# Patient Record
Sex: Male | Born: 1945 | Race: White | Hispanic: No | Marital: Married | Smoking: Never smoker
Health system: Southern US, Community
[De-identification: ages and names within clinical notes are randomized; demographics above are authoritative.]

## PROBLEM LIST (undated history)

## (undated) DIAGNOSIS — E039 Hypothyroidism, unspecified: Secondary | ICD-10-CM

## (undated) DIAGNOSIS — K760 Fatty (change of) liver, not elsewhere classified: Secondary | ICD-10-CM

## (undated) DIAGNOSIS — Z9989 Dependence on other enabling machines and devices: Principal | ICD-10-CM

## (undated) DIAGNOSIS — F32A Depression, unspecified: Secondary | ICD-10-CM

## (undated) DIAGNOSIS — I209 Angina pectoris, unspecified: Secondary | ICD-10-CM

## (undated) DIAGNOSIS — I2510 Atherosclerotic heart disease of native coronary artery without angina pectoris: Secondary | ICD-10-CM

## (undated) DIAGNOSIS — M109 Gout, unspecified: Secondary | ICD-10-CM

## (undated) DIAGNOSIS — R011 Cardiac murmur, unspecified: Secondary | ICD-10-CM

## (undated) DIAGNOSIS — J189 Pneumonia, unspecified organism: Secondary | ICD-10-CM

## (undated) DIAGNOSIS — I509 Heart failure, unspecified: Secondary | ICD-10-CM

## (undated) DIAGNOSIS — F419 Anxiety disorder, unspecified: Secondary | ICD-10-CM

## (undated) DIAGNOSIS — E669 Obesity, unspecified: Secondary | ICD-10-CM

## (undated) DIAGNOSIS — K439 Ventral hernia without obstruction or gangrene: Secondary | ICD-10-CM

## (undated) DIAGNOSIS — E119 Type 2 diabetes mellitus without complications: Secondary | ICD-10-CM

## (undated) DIAGNOSIS — J342 Deviated nasal septum: Secondary | ICD-10-CM

## (undated) DIAGNOSIS — M1990 Unspecified osteoarthritis, unspecified site: Secondary | ICD-10-CM

## (undated) DIAGNOSIS — K219 Gastro-esophageal reflux disease without esophagitis: Secondary | ICD-10-CM

## (undated) DIAGNOSIS — N400 Enlarged prostate without lower urinary tract symptoms: Secondary | ICD-10-CM

## (undated) DIAGNOSIS — G4733 Obstructive sleep apnea (adult) (pediatric): Secondary | ICD-10-CM

## (undated) DIAGNOSIS — K859 Acute pancreatitis without necrosis or infection, unspecified: Secondary | ICD-10-CM

## (undated) DIAGNOSIS — I10 Essential (primary) hypertension: Secondary | ICD-10-CM

## (undated) DIAGNOSIS — I519 Heart disease, unspecified: Secondary | ICD-10-CM

## (undated) DIAGNOSIS — G629 Polyneuropathy, unspecified: Secondary | ICD-10-CM

## (undated) DIAGNOSIS — E785 Hyperlipidemia, unspecified: Secondary | ICD-10-CM

## (undated) HISTORY — DX: Type 2 diabetes mellitus without complications: E11.9

## (undated) HISTORY — DX: Dependence on other enabling machines and devices: Z99.89

## (undated) HISTORY — DX: Hypothyroidism, unspecified: E03.9

## (undated) HISTORY — PX: CARPAL TUNNEL RELEASE: SHX101

## (undated) HISTORY — DX: Essential (primary) hypertension: I10

## (undated) HISTORY — DX: Benign prostatic hyperplasia without lower urinary tract symptoms: N40.0

## (undated) HISTORY — DX: Deviated nasal septum: J34.2

## (undated) HISTORY — DX: Heart disease, unspecified: I51.9

## (undated) HISTORY — DX: Obstructive sleep apnea (adult) (pediatric): G47.33

## (undated) HISTORY — DX: Heart failure, unspecified: I50.9

## (undated) HISTORY — DX: Hyperlipidemia, unspecified: E78.5

## (undated) HISTORY — DX: Gout, unspecified: M10.9

## (undated) HISTORY — DX: Obesity, unspecified: E66.9

## (undated) HISTORY — PX: EYE SURGERY: SHX253

---
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
## 1979-01-22 HISTORY — PX: NASAL SINUS SURGERY: SHX719

## 2001-01-21 HISTORY — PX: CARPAL TUNNEL RELEASE: SHX101

## 2001-01-21 HISTORY — PX: SHOULDER SURGERY: SHX246

## 2001-05-09 ENCOUNTER — Encounter: Payer: Self-pay | Admitting: Emergency Medicine

## 2001-05-09 ENCOUNTER — Emergency Department (HOSPITAL_COMMUNITY): Admission: EM | Admit: 2001-05-09 | Discharge: 2001-05-09 | Payer: Self-pay | Admitting: Emergency Medicine

## 2002-08-22 ENCOUNTER — Encounter: Payer: Self-pay | Admitting: Emergency Medicine

## 2002-08-22 ENCOUNTER — Emergency Department (HOSPITAL_COMMUNITY): Admission: EM | Admit: 2002-08-22 | Discharge: 2002-08-22 | Payer: Self-pay | Admitting: Emergency Medicine

## 2002-11-08 ENCOUNTER — Encounter: Payer: Self-pay | Admitting: Internal Medicine

## 2002-11-08 ENCOUNTER — Encounter: Admission: RE | Admit: 2002-11-08 | Discharge: 2002-11-08 | Payer: Self-pay | Admitting: Internal Medicine

## 2002-11-22 ENCOUNTER — Ambulatory Visit (HOSPITAL_COMMUNITY): Admission: RE | Admit: 2002-11-22 | Discharge: 2002-11-22 | Payer: Self-pay | Admitting: Internal Medicine

## 2003-02-25 ENCOUNTER — Encounter: Admission: RE | Admit: 2003-02-25 | Discharge: 2003-02-25 | Payer: Self-pay | Admitting: Internal Medicine

## 2004-01-17 ENCOUNTER — Ambulatory Visit (HOSPITAL_COMMUNITY): Admission: RE | Admit: 2004-01-17 | Discharge: 2004-01-17 | Payer: Self-pay | Admitting: Gastroenterology

## 2004-12-02 ENCOUNTER — Encounter: Admission: RE | Admit: 2004-12-02 | Discharge: 2004-12-02 | Payer: Self-pay | Admitting: Internal Medicine

## 2005-01-10 ENCOUNTER — Encounter: Admission: RE | Admit: 2005-01-10 | Discharge: 2005-04-10 | Payer: Self-pay | Admitting: Neurosurgery

## 2005-01-18 ENCOUNTER — Emergency Department (HOSPITAL_COMMUNITY): Admission: EM | Admit: 2005-01-18 | Discharge: 2005-01-18 | Payer: Self-pay | Admitting: Family Medicine
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
## 2007-05-09 ENCOUNTER — Emergency Department (HOSPITAL_COMMUNITY): Admission: EM | Admit: 2007-05-09 | Discharge: 2007-05-09 | Payer: Self-pay | Admitting: Family Medicine

## 2008-02-05 ENCOUNTER — Emergency Department (HOSPITAL_COMMUNITY): Admission: EM | Admit: 2008-02-05 | Discharge: 2008-02-05 | Payer: Self-pay | Admitting: Family Medicine

## 2008-02-06 ENCOUNTER — Emergency Department (HOSPITAL_COMMUNITY): Admission: EM | Admit: 2008-02-06 | Discharge: 2008-02-06 | Payer: Self-pay | Admitting: Family Medicine

## 2008-05-17 ENCOUNTER — Emergency Department (HOSPITAL_COMMUNITY): Admission: EM | Admit: 2008-05-17 | Discharge: 2008-05-17 | Payer: Self-pay | Admitting: Family Medicine

## 2009-10-21 HISTORY — PX: BACK SURGERY: SHX140

## 2009-11-04 ENCOUNTER — Encounter: Admission: RE | Admit: 2009-11-04 | Discharge: 2009-11-04 | Payer: Self-pay | Admitting: Neurological Surgery

## 2009-11-15 ENCOUNTER — Ambulatory Visit (HOSPITAL_COMMUNITY)
Admission: RE | Admit: 2009-11-15 | Discharge: 2009-11-16 | Payer: Self-pay | Source: Home / Self Care | Admitting: Neurological Surgery

## 2010-02-12 ENCOUNTER — Encounter
Admission: RE | Admit: 2010-02-12 | Discharge: 2010-02-12 | Payer: Self-pay | Source: Home / Self Care | Attending: Neurological Surgery | Admitting: Neurological Surgery

## 2010-03-17 ENCOUNTER — Inpatient Hospital Stay (INDEPENDENT_AMBULATORY_CARE_PROVIDER_SITE_OTHER)
Admission: RE | Admit: 2010-03-17 | Discharge: 2010-03-17 | Disposition: A | Discharge status: Home / Self Care | Payer: BC Managed Care – PPO | Source: Ambulatory Visit | Attending: Family Medicine | Admitting: Family Medicine

## 2010-03-17 DIAGNOSIS — J111 Influenza due to unidentified influenza virus with other respiratory manifestations: Secondary | ICD-10-CM

## 2010-03-17 LAB — POCT I-STAT, CHEM 8
BUN: 13 mg/dL (ref 6–23)
Calcium, Ion: 1.12 mmol/L (ref 1.12–1.32)
Chloride: 103 mEq/L (ref 96–112)
Creatinine, Ser: 0.9 mg/dL (ref 0.4–1.5)
Glucose, Bld: 103 mg/dL — ABNORMAL HIGH (ref 70–99)
HCT: 46 % (ref 39.0–52.0)
Hemoglobin: 15.6 g/dL (ref 13.0–17.0)
Potassium: 3.6 mEq/L (ref 3.5–5.1)
Sodium: 138 mEq/L (ref 135–145)
TCO2: 24 mmol/L (ref 0–100)

## 2010-04-04 LAB — COMPREHENSIVE METABOLIC PANEL
ALT: 31 U/L (ref 0–53)
AST: 27 U/L (ref 0–37)
Albumin: 4.3 g/dL (ref 3.5–5.2)
Alkaline Phosphatase: 66 U/L (ref 39–117)
BUN: 14 mg/dL (ref 6–23)
CO2: 26 mEq/L (ref 19–32)
Calcium: 9.8 mg/dL (ref 8.4–10.5)
Chloride: 108 mEq/L (ref 96–112)
Creatinine, Ser: 1.05 mg/dL (ref 0.4–1.5)
GFR calc Af Amer: >60 mL/min (ref >60)
GFR calc non Af Amer: >60 mL/min (ref >60)
Glucose, Bld: 117 mg/dL — ABNORMAL HIGH (ref 70–99)
Potassium: 4 mEq/L (ref 3.5–5.1)
Sodium: 139 mEq/L (ref 135–145)
Total Bilirubin: 0.4 mg/dL (ref 0.3–1.2)
Total Protein: 6.9 g/dL (ref 6.0–8.3)

## 2010-04-04 LAB — GLUCOSE, CAPILLARY
Glucose-Capillary: 139 mg/dL — ABNORMAL HIGH (ref 70–99)
Glucose-Capillary: 143 mg/dL — ABNORMAL HIGH (ref 70–99)
Glucose-Capillary: 146 mg/dL — ABNORMAL HIGH (ref 70–99)
Glucose-Capillary: 147 mg/dL — ABNORMAL HIGH (ref 70–99)
Glucose-Capillary: 162 mg/dL — ABNORMAL HIGH (ref 70–99)
Glucose-Capillary: 223 mg/dL — ABNORMAL HIGH (ref 70–99)

## 2010-04-04 LAB — CBC
HCT: 39.8 % (ref 39.0–52.0)
Hemoglobin: 13.5 g/dL (ref 13.0–17.0)
MCH: 29.2 pg (ref 26.0–34.0)
MCHC: 33.9 g/dL (ref 30.0–36.0)
MCV: 86.1 fL (ref 78.0–100.0)
Platelets: 302 10*3/uL (ref 150–400)
RBC: 4.62 MIL/uL (ref 4.22–5.81)
RDW: 14 % (ref 11.5–15.5)
WBC: 9.5 10*3/uL (ref 4.0–10.5)

## 2010-04-04 LAB — TYPE AND SCREEN
ABO/RH(D): A POS
Antibody Screen: NEGATIVE

## 2010-04-04 LAB — SURGICAL PCR SCREEN
MRSA, PCR: NEGATIVE
Staphylococcus aureus: NEGATIVE

## 2010-04-04 LAB — DIFFERENTIAL
Basophils Absolute: 0 10*3/uL (ref 0.0–0.1)
Basophils Relative: 0 % (ref 0–1)
Eosinophils Absolute: 0.2 10*3/uL (ref 0.0–0.7)
Eosinophils Relative: 2 % (ref 0–5)
Lymphocytes Relative: 30 % (ref 12–46)
Lymphs Abs: 2.8 10*3/uL (ref 0.7–4.0)
Monocytes Absolute: 0.6 10*3/uL (ref 0.1–1.0)
Monocytes Relative: 6 % (ref 3–12)
Neutro Abs: 5.9 10*3/uL (ref 1.7–7.7)
Neutrophils Relative %: 62 % (ref 43–77)

## 2010-04-04 LAB — ABO/RH: ABO/RH(D): A POS

## 2010-04-24 ENCOUNTER — Other Ambulatory Visit: Payer: Self-pay | Admitting: Neurological Surgery

## 2010-04-24 ENCOUNTER — Ambulatory Visit
Admission: RE | Admit: 2010-04-24 | Discharge: 2010-04-24 | Disposition: A | Discharge status: Home / Self Care | Payer: BC Managed Care – PPO | Source: Ambulatory Visit | Attending: Neurological Surgery | Admitting: Neurological Surgery

## 2010-04-24 DIAGNOSIS — M542 Cervicalgia: Secondary | ICD-10-CM

## 2010-05-02 LAB — POCT URINALYSIS DIP (DEVICE)
Bilirubin Urine: NEGATIVE
Glucose, UA: NEGATIVE mg/dL
Hgb urine dipstick: NEGATIVE
Ketones, ur: NEGATIVE mg/dL
Nitrite: NEGATIVE
Protein, ur: NEGATIVE mg/dL
Specific Gravity, Urine: 1.02 (ref 1.005–1.030)
Urobilinogen, UA: 0.2 mg/dL (ref 0.0–1.0)
pH: 7.5 (ref 5.0–8.0)

## 2010-06-08 NOTE — Op Note (Signed)
Anthony [name], Anthony [name] NO.:  [id number]   MEDICAL RECORD NO.:  [id number]          PATIENT TYPE:  AMB   LOCATION:  ENDO                         FACILITY:  [facility]   PHYSICIAN:  Jordan Hawks. Elnoria Howard, MD    DATE OF BIRTH:  May 22, 1945   DATE OF PROCEDURE:  01/17/2004  DATE OF DISCHARGE:                                 OPERATIVE REPORT   PROCEDURE:  Screening colonoscopy.   ENDOSCOPIST:  Jordan Hawks. Elnoria Howard, M.D.   INDICATIONS FOR PROCEDURE:  An informed consent was obtained from the  patient, describing the risks of bleeding, infection, perforation,  medication reactions, a 10% mid-rate for a small colon cancer or polyp and  the risk of death, all of which are not exclusive of any other complications  that may occur.   PHYSICAL EXAMINATION:  HEART:  A regular rate and rhythm.  LUNGS:  Clear to auscultation bilaterally.  ABDOMEN:  Soft, nontender, nondistended.   MEDICATIONS:  Versed 7 mg IV, Demerol 70 mg IV.   DESCRIPTION OF PROCEDURE:  After adequate sedation was achieved, a rectal  examination was performed, which is negative for any palpable abnormalities.  The colonoscope was then inserted from the anus and advanced under direct  visualization to the terminal ileum without difficulty.  Photo documentation  of the terminal ileum and the cecum was obtained.  The patient was noted to  have a good prep.  Upon slow withdrawal of the colonoscope, there is no  evidence of any masses, polyps, ulcerations, inflammation, erosions, or  vascular abnormalities, or diverticula in the cecum, ascending, transverse,  descending, or sigmoid colon.  On retroflexion, the rectum revealed moderate  internal and external hemorrhoids.  The colonoscope was then straightened  and withdrawn from the patient.  The procedure was terminated.  No  complications were encountered.  The patient tolerated the procedure well.   PLAN:  A repeat colonoscopy in 10 years.       PDH/MEDQ  D:   01/17/2004  T:  01/17/2004  Job:  [id number]

## 2010-06-13 ENCOUNTER — Encounter (INDEPENDENT_AMBULATORY_CARE_PROVIDER_SITE_OTHER): Payer: BC Managed Care – PPO

## 2010-06-13 DIAGNOSIS — M79609 Pain in unspecified limb: Secondary | ICD-10-CM

## 2010-06-20 NOTE — Procedures (Unsigned)
DUPLEX DEEP VENOUS EXAM - LOWER EXTREMITY  INDICATION:  Left foot pain.  HISTORY:  Edema:  Foot trauma. Trauma/Surgery:  Dog bite to the left calf 1 week ago. Pain:  Left foot and ankle pain. PE:  No. Previous DVT:  No. Anticoagulants:  No. Other:  Redness and warmth to the left foot.  Patient presenting with fever and chills.  DUPLEX EXAM:               CFV   SFV   PopV  PTV    GSV               R  L  R  L  R  L  R   L  R  L Thrombosis    o  o     o     o      o     o Spontaneous   +  +     +     +      +     + Phasic        +  +     +     +      +     + Augmentation  +  +     +     +      +     + Compressible  +  +     +     +      +     + Competent     +  +     +     +      +     +  Legend:  + - yes  o - no  p - partial  D - decreased  IMPRESSION:  No evidence of left lower extremity deep venous thrombosis.  Results called to Dr. Rinaldo Cloud nurse.  Patient advised to return home after appointment and wait for Dr. Rinaldo Cloud office to phone call with any further instructions.   _____________________________ Fransisco Hertz, MD  EM/MEDQ  D:  06/14/2010  T:  06/14/2010  Job:  [id number]

## 2010-10-16 LAB — DIFFERENTIAL
Basophils Absolute: 0.1
Basophils Relative: 1
Eosinophils Absolute: 0.2
Eosinophils Relative: 1
Lymphocytes Relative: 23
Lymphs Abs: 2.7
Monocytes Absolute: 1
Monocytes Relative: 8
Neutro Abs: 7.8 — ABNORMAL HIGH
Neutrophils Relative %: 67

## 2010-10-16 LAB — POCT I-STAT, CHEM 8
BUN: 21
Calcium, Ion: 1.16
Chloride: 103
Creatinine, Ser: 1.6 — ABNORMAL HIGH
Glucose, Bld: 107 — ABNORMAL HIGH
HCT: 43
Hemoglobin: 14.6
Potassium: 4.1
Sodium: 138
TCO2: 27

## 2010-10-16 LAB — CBC
HCT: 40.9
Hemoglobin: 14
MCHC: 34.3
MCV: 84.9
Platelets: 375
RBC: 4.82
RDW: 13.2
WBC: 11.7 — ABNORMAL HIGH

## 2010-10-16 LAB — URIC ACID: Uric Acid, Serum: 10.2 — ABNORMAL HIGH

## 2010-11-20 ENCOUNTER — Ambulatory Visit
Admission: RE | Admit: 2010-11-20 | Discharge: 2010-11-20 | Disposition: A | Discharge status: Home / Self Care | Payer: BC Managed Care – PPO | Source: Ambulatory Visit | Attending: Neurological Surgery | Admitting: Neurological Surgery

## 2010-11-20 ENCOUNTER — Other Ambulatory Visit: Payer: Self-pay | Admitting: Neurological Surgery

## 2010-11-20 DIAGNOSIS — M5020 Other cervical disc displacement, unspecified cervical region: Secondary | ICD-10-CM

## 2010-11-20 DIAGNOSIS — M4802 Spinal stenosis, cervical region: Secondary | ICD-10-CM

## 2010-11-20 DIAGNOSIS — M542 Cervicalgia: Secondary | ICD-10-CM

## 2011-06-11 ENCOUNTER — Other Ambulatory Visit: Payer: Self-pay | Admitting: Internal Medicine

## 2011-06-11 DIAGNOSIS — R1011 Right upper quadrant pain: Secondary | ICD-10-CM

## 2011-06-12 ENCOUNTER — Other Ambulatory Visit: Payer: Self-pay | Admitting: Internal Medicine

## 2011-06-12 ENCOUNTER — Ambulatory Visit
Admission: RE | Admit: 2011-06-12 | Discharge: 2011-06-12 | Disposition: A | Discharge status: Home / Self Care | Payer: Medicare Other | Source: Ambulatory Visit | Attending: Internal Medicine | Admitting: Internal Medicine

## 2011-06-12 DIAGNOSIS — R1011 Right upper quadrant pain: Secondary | ICD-10-CM

## 2012-01-22 HISTORY — PX: CATARACT EXTRACTION: SUR2

## 2013-05-14 ENCOUNTER — Ambulatory Visit (INDEPENDENT_AMBULATORY_CARE_PROVIDER_SITE_OTHER): Payer: Medicare Other | Admitting: Neurology

## 2013-05-14 ENCOUNTER — Encounter: Payer: Self-pay | Admitting: Neurology

## 2013-05-14 ENCOUNTER — Encounter (INDEPENDENT_AMBULATORY_CARE_PROVIDER_SITE_OTHER): Payer: Self-pay

## 2013-05-14 VITALS — BP 150/90 | HR 70 | Resp 16 | Ht 70.75 in | Wt 232.0 lb

## 2013-05-14 DIAGNOSIS — Z9989 Dependence on other enabling machines and devices: Principal | ICD-10-CM

## 2013-05-14 DIAGNOSIS — G4733 Obstructive sleep apnea (adult) (pediatric): Secondary | ICD-10-CM | POA: Insufficient documentation

## 2013-05-14 DIAGNOSIS — J342 Deviated nasal septum: Secondary | ICD-10-CM

## 2013-05-14 DIAGNOSIS — E669 Obesity, unspecified: Secondary | ICD-10-CM

## 2013-05-14 DIAGNOSIS — E66811 Obesity, class 1: Secondary | ICD-10-CM | POA: Insufficient documentation

## 2013-05-14 HISTORY — DX: Obesity, class 1: E66.811

## 2013-05-14 HISTORY — DX: Obesity, unspecified: E66.9

## 2013-05-14 HISTORY — DX: Deviated nasal septum: J34.2

## 2013-05-14 NOTE — Progress Notes (Signed)
[facility]  Provider:  Larey Seat, M D  Referring Provider: Haywood Pao, MD Primary Care Physician:  Haywood Pao, MD  Chief Complaint  Patient presents with  . New Evaluation    Room 11  . Sleep consult    HPI:  Anthony [name] is a 68 y.o. male  Is seen here as a referral  from Dr. Osborne Casco for a sleep consultation,  Anthony [name] is a gentleman with health care coverage [facility], presents today for evaluation of sleep problems one is ongoing restless leg syndrome, and other a history of sleep apnea. Anthony [name] was seen in the 2009 at the [facility] based on observations of loud snoring, fragmented sleep and witnessed apneas.  He was also moderately obese at the time. On 10-21-07 the patient was diagnosed with an AHI of 16.5 and an RDI of 17.2 with mild-to-moderate apnea. He did have low oxygen saturations at the time but a very high sleep efficiency of 91.5%. He did have frequent periodic limb movements at night during REM sleep his oxygen nadir went to 79% the patient was asked to return for a CPAP titration on 11-25-on 9. His oxygen saturation throughout the night was excellent. Sleep efficiency was poor only 63.2% of the night and his sleep.  Also the patient's AHI had been 0.0 at 8 cm of water pressure, 40 minutes of sleep including 8 minutes of REM sleep, he was further titrated to 13 cm water pressure. He had 11.5 arousals spontaneously under this higher pressures as he didn't tolerate the pressure, reflected in his AHI was actually 6.58/hr. Yet this pressure was prescribed for the patient to be used at home?   His usual bedtime is 10.30 PM, he falls asleep after 30-45 minutes ( RLS)  and rises at 7.30 AM.  He wakes up frequently with 4 bathroom breaks. Neck and shoulder pain make it more difficult to find a comfortable sleep position. He cannot tolerate supine sleep, as he chokes. He l always wakes up at 5 AM sometimes able  to fall asleep again. 4.5 hours average sleep time.  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 His early rise time may be explaining the 4 AM arousal.  He uses iced tea 4-5 glasses , his only source of caffeine, No tobacco or alcohol use and no history of such.   He never felt any refreshing effects from using CPAP , and when his machine broke he felt he slept much better without it- his wife was sending him to the re evaluation because of his thunderous snoring. Yet he remained compliant with CPAP use. He has not had any supplies from his DME, which went out of business. He never has seen a medical doctor in follow up of the sleep study.    Review of Systems: Out of a complete 14 system review, the patient complains of only the following symptoms, and all other reviewed systems are negative. The patient endorses at the wrist score today at 16 points and his fatigue severity score at 48 points, the geriatric depression score at 3 points. He endorsed further loss of vision fatigue snoring wheezing and shortness of breath. Also feeling hot, having headaches, difficulty swallowing, restless legs, anxiety, some decreased energy , the feeling of not getting enough sleep, diarrhea, runny nose, hearing loss and urinary problems as well as impotence.  The patient had been exposed to agent  orange during his [employer], has diabetes, hypertension, obesity, gout and elevated cholesterol. He is also treated for GERD. SHIFTWORK history. Neck fusion, anterior access.  History   Social History  . Marital Status: Married    Spouse Name: [name]    Number of Children: 2  . Years of Education: 12   Occupational History  . [employer]    Social History Main Topics  . Smoking status: Never Smoker   . Smokeless tobacco: Never Used  . Alcohol Use: No  . Drug Use: No  . Sexual Activity: Not on file   Other Topics Concern  . Not on file   Social History Narrative    Patient is married [name]) and lives at home with his wife.   Patient has two children (twins).   Patient is retired.   Patient has a high school education.   Patient drinks 4-  8 oz of tea daily.    Family History  Problem Relation Age of Onset  . Colon cancer Father   . Colon cancer Maternal Grandfather   . Diabetes Maternal Grandmother   . Heart Problems Maternal Grandfather     Past Medical History  Diagnosis Date  . Diabetes mellitus type 2, controlled   . HTN (hypertension)   . Hyperlipidemia   . Hypothyroid   . Gout   . BPH (benign prostatic hyperplasia)   . OSA on CPAP     Past Surgical History  Procedure Laterality Date  . Carpal tunnel release Left 2003  . Shoulder surgery Right 2003  . Carpal tunnel release Bilateral   . Nasal sinus surgery  1981  . Back surgery  10/2009    Cervical, arterior  . Cataract extraction  01/2012    Current Outpatient Prescriptions  Medication Sig Dispense Refill  . allopurinol (ZYLOPRIM) 300 MG tablet 1 tablet daily.      Marland Kitchen lisinopril (PRINIVIL,ZESTRIL) 40 MG tablet 1 tablet daily.      . metFORMIN (GLUCOPHAGE) 1000 MG tablet 1 tablet 2 (two) times daily.      . pantoprazole (PROTONIX) 40 MG tablet 1 tablet daily.      . predniSONE (DELTASONE) 10 MG tablet 1 tablet daily.      Marland Kitchen rOPINIRole (REQUIP) 0.25 MG tablet 1 tablet daily.      . simvastatin (ZOCOR) 40 MG tablet 1 tablet daily.      Marland Kitchen SYNTHROID 25 MCG tablet 1 tablet daily.      . tamsulosin (FLOMAX) 0.4 MG CAPS capsule 1 capsule daily.       No current facility-administered medications for this visit.    Allergies as of 05/14/2013  . (Not on File)    Vitals: BP 150/90  Pulse 70  Resp 16  Ht 5' 10.75" (1.797 m)  Wt 232 lb (105.235 kg)  BMI 32.59 kg/m2 Last Weight:  Wt Readings from Last 1 Encounters:  05/14/13 232 lb (105.235 kg)   Last Height:   Ht Readings from Last 1 Encounters:  05/14/13 5' 10.75" (1.797 m)    Physical exam:  General: The  patient is awake, alert and appears not in acute distress. The patient is well groomed. Head: Normocephalic, atraumatic. Neck is supple. Mallampati 3, nasal congestion, septal deviation- neck circumference: 20 inches. No retrognathia.  Cardiovascular:  Regular rate and rhythm , without  murmurs or carotid bruit, and without distended neck veins. Respiratory: Lungs are clear to auscultation. Skin:  Without evidence of edema, or rash Trunk: BMI is  elevated and  patient  has normal posture.  Neurologic exam : The patient is awake and alert, oriented to place and time.  Memory subjective  described as intact.  There is a normal attention span & concentration ability. Speech is fluent without  dysarthria, dysphonia or aphasia. Mood and affect are appropriate.  Cranial nerves: patient reports reduced sense of smell.  Pupils are equal and briskly reactive to light. Funduscopic exam without evidence of pallor or edema. Extraocular movements in vertical and horizontal planes intact and without nystagmus. Visual fields by finger perimetry are intact. Hearing to finger rub intact on the right, decreased on the left .  Facial sensation intact to fine touch. Facial motor strength is symmetric and tongue and uvula move midline.  Motor exam: Normal tone  and symmetric normal strength in all extremities.  Sensory:  Fine touch, pinprick and vibration , Proprioception normal.  Coordination: Rapid alternating movements in the fingers/hands is tested and normal.Gait and station: Patient walks without assistive device .Deep tendon reflexes: in the  upper and lower extremities are symmetric and intact.   Assessment:  After physical and neurologic examination, review of laboratory studies, imaging, neurophysiology testing and pre-existing records, assessment is:   Mr. Christy Sartorius still has obstructive sleep apnea and his wife has still witnessed apnea, as well as loud snoring.  Given his upper airway and is nasal septal  deviation ,we will need a split study to re-qualify him for another machine. He does wake up in the morning with headaches- therefore the study should also measures CO2.  I would like for Mr. Reather Littler to be titrated gently, I suspect that he will need a full face mask given his history of nasal obstruction and congestion.   Plan:  Treatment plan and additional workup :

## 2013-05-14 NOTE — Patient Instructions (Signed)
Obesity Obesity is having too much body fat and a body mass index (BMI) of 30 or more. BMI is a number based on your height and weight. The number is an estimate of how much body fat you have. Obesity can happen if you eat more calories than you can burn by exercising or other activity. It can cause major health problems or emergencies.  HOME CARE  Exercise and be active as told by your doctor. Try:  Using stairs when you can.  Parking farther away from store doors.  Gardening, biking, or walking.  Eat healthy foods and drinks that are low in calories. Eat more fruits and vegetables.  Limit fast food, sweets, and snack foods that are made with ingredients that are not natural (processed food).  Eat smaller amounts of food.  Keep a journal and write down what you eat every day. Websites can help with this.  Avoid drinking alcohol. Drink more water and drinks without calories.   Take vitamins and dietary pills (supplements) only as told by your doctor.  Try going to weight-loss support groups or classes to help lessen stress. Dieticians and counselors may also help. GET HELP RIGHT AWAY IF:  You have chest pain or tightness.  You have trouble breathing or feel short of breath.  You feel weak or have loss of feeling (numbness) in your legs.  You feel confused or have trouble talking.  You have sudden changes in your vision. MAKE SURE YOU:  Understand these instructions.  Will watch your condition.  Will get help right away if you are not doing well or get worse. Document Released: 04/01/2011 Document Reviewed: 04/01/2011 [facility] Patient Information 2014 [place].

## 2013-05-24 ENCOUNTER — Ambulatory Visit (INDEPENDENT_AMBULATORY_CARE_PROVIDER_SITE_OTHER): Payer: Medicare Other

## 2013-05-24 DIAGNOSIS — Z9989 Dependence on other enabling machines and devices: Principal | ICD-10-CM

## 2013-05-24 DIAGNOSIS — G4733 Obstructive sleep apnea (adult) (pediatric): Secondary | ICD-10-CM

## 2013-05-27 ENCOUNTER — Encounter: Payer: Self-pay | Admitting: *Deleted

## 2013-05-27 ENCOUNTER — Telehealth: Payer: Self-pay | Admitting: Neurology

## 2013-05-27 DIAGNOSIS — G4733 Obstructive sleep apnea (adult) (pediatric): Secondary | ICD-10-CM

## 2013-05-27 NOTE — Telephone Encounter (Signed)
I called and spoke with the patient about his recent sleep study results. I informed the patient that the study revealed mild obstructive sleep apnea. Dr. Brett Fairy recommend auto CPAP for 14 days, so I will send the order to [facility] who contact the patient. I will fax a copy of the report to Dr. Loren Racer office and mail a copy of the report to the patient.

## 2013-06-01 ENCOUNTER — Encounter: Payer: Self-pay | Admitting: Neurology

## 2013-06-21 HISTORY — PX: OTHER SURGICAL HISTORY: SHX169

## 2013-06-29 ENCOUNTER — Encounter: Payer: Self-pay | Admitting: Neurology

## 2013-06-29 ENCOUNTER — Encounter (INDEPENDENT_AMBULATORY_CARE_PROVIDER_SITE_OTHER): Payer: Self-pay

## 2013-06-29 ENCOUNTER — Ambulatory Visit (INDEPENDENT_AMBULATORY_CARE_PROVIDER_SITE_OTHER): Payer: Medicare Other | Admitting: Neurology

## 2013-06-29 VITALS — BP 123/79 | HR 74 | Resp 16 | Ht 70.75 in | Wt 239.0 lb

## 2013-06-29 DIAGNOSIS — Z9989 Dependence on other enabling machines and devices: Principal | ICD-10-CM

## 2013-06-29 DIAGNOSIS — G4733 Obstructive sleep apnea (adult) (pediatric): Secondary | ICD-10-CM

## 2013-06-29 MED ORDER — MOMETASONE FUROATE 50 MCG/ACT NA SUSP: 2.0000 | Freq: Every day | NASAL | Status: DC

## 2013-06-29 MED ORDER — CARBIDOPA-LEVODOPA 25-100 MG PO TABS: 1.0000 | ORAL_TABLET | ORAL | Status: DC
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
## 2013-06-29 NOTE — Patient Instructions (Signed)
Restless Legs Syndrome Restless legs syndrome is a movement disorder. It may also be called a sensori-motor disorder.  CAUSES  No one knows what specifically causes restless legs syndrome, but it tends to run in families. It is also more common in people with low iron, in pregnancy, in people who need dialysis, and those with nerve damage (neuropathy).Some medications may make restless legs syndrome worse.Those medications include drugs to treat high blood pressure, some heart conditions, nausea, colds, allergies, and depression. SYMPTOMS Symptoms include uncomfortable sensations in the legs. These leg sensations are worse during periods of inactivity or rest. They are also worse while sitting or lying down. Individuals that have the disorder describe sensations in the legs that feel like:  Pulling.  Drawing.  Crawling.  Worming.  Boring.  Tingling.  Pins and needles.  Prickling.  Pain. The sensations are usually accompanied by an overwhelming urge to move the legs. Sudden muscle jerks may also occur. Movement provides temporary relief from the discomfort. In rare cases, the arms may also be affected. Symptoms may interfere with going to sleep (sleep onset insomnia). Restless legs syndrome may also be related to periodic limb movement disorder (PLMD). PLMD is another more common motor disorder. It also causes interrupted sleep. The symptoms from PLMD usually occur most often when you are awake. TREATMENT  Treatment for restless legs syndrome is symptomatic. This means that the symptoms are treated.   Massage and cold compresses may provide temporary relief.  Walk, stretch, or take a cold or hot bath.  Get regular exercise and a good night's sleep.  Avoid caffeine, alcohol, nicotine, and medications that can make it worse.  Do activities that provide mental stimulation like discussions, needlework, and video games. These may be helpful if you are not able to walk or  stretch. Some medications are effective in relieving the symptoms. However, many of these medications have side effects. Ask your caregiver about medications that may help your symptoms. Correcting iron deficiency may improve symptoms for some patients. Document Released: 12/28/2001 Document Revised: 04/01/2011 Document Reviewed: 04/05/2010 ExitCare Patient Information 2014 [employer].  

## 2013-08-10 ENCOUNTER — Encounter: Payer: Self-pay | Admitting: Adult Health

## 2013-08-10 ENCOUNTER — Ambulatory Visit (INDEPENDENT_AMBULATORY_CARE_PROVIDER_SITE_OTHER): Payer: Medicare Other | Admitting: Adult Health

## 2013-08-10 VITALS — BP 161/87 | HR 73 | Ht 69.75 in | Wt 243.0 lb

## 2013-08-10 DIAGNOSIS — E669 Obesity, unspecified: Secondary | ICD-10-CM

## 2013-08-10 DIAGNOSIS — Z9989 Dependence on other enabling machines and devices: Principal | ICD-10-CM

## 2013-08-10 DIAGNOSIS — G4733 Obstructive sleep apnea (adult) (pediatric): Secondary | ICD-10-CM

## 2013-08-10 DIAGNOSIS — R202 Paresthesia of skin: Secondary | ICD-10-CM

## 2013-08-10 DIAGNOSIS — R209 Unspecified disturbances of skin sensation: Secondary | ICD-10-CM

## 2013-08-10 DIAGNOSIS — R200 Anesthesia of skin: Secondary | ICD-10-CM

## 2013-08-10 MED ORDER — GABAPENTIN 100 MG PO CAPS: 100.0000 mg | ORAL_CAPSULE | Freq: Three times a day (TID) | ORAL | Status: DC

## 2013-08-10 NOTE — Patient Instructions (Signed)
Gabapentin capsules or tablets What is this medicine? GABAPENTIN (GA ba pen tin) is used to control partial seizures in adults with epilepsy. It is also used to treat certain types of nerve pain. This medicine may be used for other purposes; ask your health care provider or pharmacist if you have questions. COMMON BRAND NAME(S): Gabarone, Neurontin What should I tell my health care provider before I take this medicine? They need to know if you have any of these conditions: -kidney disease -suicidal thoughts, plans, or attempt; a previous suicide attempt by you or a family member -an unusual or allergic reaction to gabapentin, other medicines, foods, dyes, or preservatives -pregnant or trying to get pregnant -breast-feeding How should I use this medicine? Take this medicine by mouth with a glass of water. Follow the directions on the prescription label. You can take it with or without food. If it upsets your stomach, take it with food.Take your medicine at regular intervals. Do not take it more often than directed. Do not stop taking except on your doctor's advice. If you are directed to break the 600 or 800 mg tablets in half as part of your dose, the extra half tablet should be used for the next dose. If you have not used the extra half tablet within 28 days, it should be thrown away. A special MedGuide will be given to you by the pharmacist with each prescription and refill. Be sure to read this information carefully each time. Talk to your pediatrician regarding the use of this medicine in children. Special care may be needed. Overdosage: If you think you have taken too much of this medicine contact a poison control center or emergency room at once. NOTE: This medicine is only for you. Do not share this medicine with others. What if I miss a dose? If you miss a dose, take it as soon as you can. If it is almost time for your next dose, take only that dose. Do not take double or extra  doses. What may interact with this medicine? Do not take this medicine with any of the following medications: -other gabapentin products This medicine may also interact with the following medications: -alcohol -antacids -antihistamines for allergy, cough and cold -certain medicines for anxiety or sleep -certain medicines for depression or psychotic disturbances -homatropine; hydrocodone -naproxen -narcotic medicines (opiates) for pain -phenothiazines like chlorpromazine, mesoridazine, prochlorperazine, thioridazine This list may not describe all possible interactions. Give your health care provider a list of all the medicines, herbs, non-prescription drugs, or dietary supplements you use. Also tell them if you smoke, drink alcohol, or use illegal drugs. Some items may interact with your medicine. What should I watch for while using this medicine? Visit your doctor or health care professional for regular checks on your progress. You may want to keep a record at home of how you feel your condition is responding to treatment. You may want to share this information with your doctor or health care professional at each visit. You should contact your doctor or health care professional if your seizures get worse or if you have any new types of seizures. Do not stop taking this medicine or any of your seizure medicines unless instructed by your doctor or health care professional. Stopping your medicine suddenly can increase your seizures or their severity. Wear a medical identification bracelet or chain if you are taking this medicine for seizures, and carry a card that lists all your medications. You may get drowsy, dizzy, or have blurred   vision. Do not drive, use machinery, or do anything that needs mental alertness until you know how this medicine affects you. To reduce dizzy or fainting spells, do not sit or stand up quickly, especially if you are an older patient. Alcohol can increase drowsiness and  dizziness. Avoid alcoholic drinks. Your mouth may get dry. Chewing sugarless gum or sucking hard candy, and drinking plenty of water will help. The use of this medicine may increase the chance of suicidal thoughts or actions. Pay special attention to how you are responding while on this medicine. Any worsening of mood, or thoughts of suicide or dying should be reported to your health care professional right away. Women who become pregnant while using this medicine may enroll in the [place] American Antiepileptic Drug Pregnancy Registry by calling 1-888-233-2334. This registry collects information about the safety of antiepileptic drug use during pregnancy. What side effects may I notice from receiving this medicine? Side effects that you should report to your doctor or health care professional as soon as possible: -allergic reactions like skin rash, itching or hives, swelling of the face, lips, or tongue -worsening of mood, thoughts or actions of suicide or dying Side effects that usually do not require medical attention (report to your doctor or health care professional if they continue or are bothersome): -constipation -difficulty walking or controlling muscle movements -dizziness -nausea -slurred speech -tiredness -tremors -weight gain This list may not describe all possible side effects. Call your doctor for medical advice about side effects. You may report side effects to FDA at 1-800-FDA-1088. Where should I keep my medicine? Keep out of reach of children. Store at room temperature between 15 and 30 degrees C (59 and 86 degrees F). Throw away any unused medicine after the expiration date. NOTE: This sheet is a summary. It may not cover all possible information. If you have questions about this medicine, talk to your doctor, pharmacist, or health care provider.  2015, Elsevier/Gold Standard. (2012-09-10 09:12:48)  

## 2013-08-10 NOTE — Progress Notes (Signed)
PATIENT: Anthony [name] DOB: [birth year]  REASON FOR VISIT: follow up HISTORY FROM: patient  HISTORY OF PRESENT ILLNESS: Anthony [name] is a 68 year old male with history of obstructive sleep Apnea. He returns today for a 30 day compliance download. He brought his machine with him today and his reports shows an AHI of 4.2 at 8-13cm of water, uses his machine for 8 hours and 38 minutes a night, with 100% compliance. His Epworth score is 13 points was previously 16 points. His fatigue severity score is 40 was previously 48.  Patient reports that he gets about 7 hours of sleep a night. He goes to bed around 10-11pm and arises at 7:30am . He states that it usually takes between 30 minutes to 1 hour for him to fall asleep. States that the gets up about 2 times a night to urinate. Overall patient feels that CPAP has improved his sleepiness and fatigue. Since the last visit the patient has had no new medical issues. Patient was taking Requip due to RLS but that was discontinued last visit and he was started on sinemet. He reports that the sinemet does not help at all. He states that during the day he has burning and tingling in the bottom of the feet and notices this the most when he is sitting and resting. Patient states that when he wears his tight socks the discomfort is better. Patient is concerned because he is gaining weight but he is exercising daily. He states this started with Requip but did not improve with sinemet.   REVIEW OF SYSTEMS: Full 14 system review of systems performed and notable only for:  Constitutional: unexpected weight change, excessive sweating Eyes: N/A Ear/Nose/Throat: runny nose Skin: N/A  Cardiovascular: leg swelling Respiratory: N/A  Gastrointestinal: N/A  Genitourinary: N/A Hematology/Lymphatic: N/A  Endocrine: N/A Musculoskeletal: neck pain and neck stiffness Allergy/Immunology: N/A  Neurological: N/A Psychiatric: N/A Sleep: restless leg, daytime  sleepiness   ALLERGIES: No Known Allergies  HOME MEDICATIONS: Outpatient Prescriptions Prior to Visit  Medication Sig Dispense Refill  . allopurinol (ZYLOPRIM) 300 MG tablet 1 tablet daily.      . carbidopa-levodopa (SINEMET) 25-100 MG per tablet Take 1 tablet by mouth as directed. Take one 2 hours prior to bed time.  30 tablet  1  . furosemide (LASIX) 40 MG tablet Take 40 mg by mouth daily as needed.       Marland Kitchen lisinopril (PRINIVIL,ZESTRIL) 40 MG tablet 1 tablet daily.      . metFORMIN (GLUCOPHAGE) 1000 MG tablet 1 tablet 2 (two) times daily.      . mometasone (NASONEX) 50 MCG/ACT nasal spray Place 2 sprays into the nose daily.  17 g  12  . pantoprazole (PROTONIX) 40 MG tablet 1 tablet daily.      . predniSONE (DELTASONE) 10 MG tablet 1 tablet daily.      . simvastatin (ZOCOR) 40 MG tablet 1 tablet daily.      Marland Kitchen SYNTHROID 25 MCG tablet 1 tablet daily.      . tamsulosin (FLOMAX) 0.4 MG CAPS capsule 1 capsule daily.       No facility-administered medications prior to visit.    PAST MEDICAL HISTORY: Past Medical History  Diagnosis Date  . Diabetes mellitus type 2, controlled   . HTN (hypertension)   . Hyperlipidemia   . Hypothyroid   . Gout   . BPH (benign prostatic hyperplasia)   . OSA on CPAP   . 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  Obesity (BMI 30.0-34.9) 05/14/2013    PAST SURGICAL HISTORY: Past Surgical History  Procedure Laterality Date  . Carpal tunnel release Left 2003  . Shoulder surgery Right 2003  . Carpal tunnel release Bilateral   . Nasal sinus surgery  1981  . Back surgery  10/2009    Cervical, arterior  . Cataract extraction  01/2012  . Retinal  06/2013    Retinal peel    FAMILY HISTORY: Family History  Problem Relation Age of Onset  . Colon cancer Father   . Colon cancer Maternal Grandfather   . Diabetes Maternal Grandmother   . Heart Problems Maternal Grandfather     SOCIAL HISTORY: History   Social History  . Marital Status: Married     Spouse Name: Anthony [name]    Number of Children: 2  . Years of Education: 12   Occupational History  . [employer]    Social History Main Topics  . Smoking status: Never Smoker   . Smokeless tobacco: Never Used  . Alcohol Use: No  . Drug Use: No  . Sexual Activity: Not on file   Other Topics Concern  . Not on file   Social History Narrative   Patient is married Anthony [name]) and lives at home with his wife.   Patient has two children (twins).   Patient is retired.   Patient has a high school education.   Patient does not drink any caffeine.   Patient is left-handed.      PHYSICAL EXAM  Filed Vitals:   08/10/13 1403  BP: 161/87  Pulse: 73  Height: 5' 9.75" (1.772 m)  Weight: 243 lb (110.224 kg)   Body mass index is 35.1 kg/(m^2).  Generalized: Well developed, in no acute distress   Neurological examination  Mentation: Alert oriented to time, place, history taking. Follows all commands speech and language fluent Cranial nerve II-XII: .Pupils were equal round reactive to light extraocular movements were full, visual field were full on confrontational test.  Motor: The motor testing reveals 5 over 5 strength of all 4 extremities. Good symmetric motor tone is noted throughout.  Sensory: Sensory testing is intact to  soft touch and position sense on all 4 extremities. Decreased pinprick in a stocking pattern in bilateral feet. Vibration sensation is decreased in lower extremities. No evidence of extinction is noted.  Coordination: Cerebellar testing reveals good finger-nose-finger and heel-to-shin bilaterally.  Gait and station: Gait is normal.  Reflexes: Deep tendon reflexes are symmetric but depressed  DIAGNOSTIC DATA (LABS, IMAGING, TESTING) - I reviewed patient records, labs, notes, testing and imaging myself where available.  Lab Results  Component Value Date   WBC 9.5 11/10/2009   HGB 15.6 03/17/2010   HCT 46.0 03/17/2010   MCV 86.1 11/10/2009   PLT 302 11/10/2009       Component Value Date/Time   NA 138 03/17/2010 1145   K 3.6 03/17/2010 1145   CL 103 03/17/2010 1145   CO2 26 11/10/2009 1554   GLUCOSE 103* 03/17/2010 1145   BUN 13 03/17/2010 1145   CREATININE 0.9 03/17/2010 1145   CALCIUM 9.8 11/10/2009 1554   PROT 6.9 11/10/2009 1554   ALBUMIN 4.3 11/10/2009 1554   AST 27 11/10/2009 1554   ALT 31 11/10/2009 1554   ALKPHOS 66 11/10/2009 1554   BILITOT 0.4 11/10/2009 1554   GFRNONAA >60 11/10/2009 1554   GFRAA  Value: >60        The eGFR has been calculated using the MDRD equation. This calculation has  not been validated in all clinical situations. eGFR's persistently <60 mL/min signify possible Chronic Kidney Disease. 11/10/2009 1554    ASSESSMENT AND PLAN 68 y.o. year old male  has a past medical history of Diabetes mellitus type 2, controlled; HTN (hypertension); Hyperlipidemia; Hypothyroid; Gout; BPH (benign prostatic hyperplasia); OSA on CPAP; Nasal septal deformity (05/14/2013); and Obesity (BMI 30.0-34.9) (05/14/2013). here with:  1. OSA CPAP 2. Obesity 3. Numbness and tingling of both legs below the knees.  Patient has excellent compliance on CPAP and AHI has reduced to 4.2 from 8.0 at the last visit. Overall he does feel better with the CPAP. Patent still having issues with restless legs. He states that it takes him about an hour to get comfortable before falling asleep. He was on Requip but it discontinue due to compulsiveness. He tried Sinemet but states that offered no benefit. Patient is also complaining of numbness and tingling in both feet during the day mostly at rest. 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 I will order nerve conduction studies with EMG on both legs. Sinemet will be discontinued. I will start the patient on gabapentin 100 mg TID. This is a low dose and may need to be tapered up to provide relief for his discomfort. Patient also concerned about weight gain however he is on  prednisone which can cause this. I explained this to the patient and he verbalized understanding. Patient should follow-up in 3 months or sooner if needed.   Ward Givens, MSN, NP-C 08/10/2013, 2:26 PM [facility] [address], [place], [place] [address] [phone]  Note: This document was prepared with digital dictation and possible smart phrase technology. Any transcriptional errors that result from this process are unintentional.

## 2013-08-23 ENCOUNTER — Telehealth: Payer: Self-pay | Admitting: Adult Health

## 2013-08-23 MED ORDER — GABAPENTIN 100 MG PO CAPS: ORAL_CAPSULE | ORAL | Status: DC

## 2013-08-23 NOTE — Telephone Encounter (Signed)
Anthony [name] DOB 05/29/2045 is calling--patient has been taking Gabapentin 100mg  and medication is not helping--patient still has nerve pain in his legs--can dosage be increased--please call and advise--you can leave a message--[facility]

## 2013-08-23 NOTE — Telephone Encounter (Signed)
Patient called stating that he is still having some discomfort in his feet at night. He is currently take gabapentin 100 mg TID. I will increase his dose to 1 capsule (100 mg)  in the morning and at noon and 3 capsules at bedtime. Patient verbalized understanding.

## 2013-08-23 NOTE — Telephone Encounter (Signed)
I called the patient and left a message for him to return my call.

## 2013-08-23 NOTE — Telephone Encounter (Signed)
Patient does not feel the current dose of Gabapentin is helpful and would like to request a dose increase.  Please advise.  Thank you.

## 2013-08-30 ENCOUNTER — Encounter (INDEPENDENT_AMBULATORY_CARE_PROVIDER_SITE_OTHER): Payer: Self-pay

## 2013-08-30 ENCOUNTER — Telehealth: Payer: Self-pay | Admitting: Adult Health

## 2013-08-30 ENCOUNTER — Ambulatory Visit (INDEPENDENT_AMBULATORY_CARE_PROVIDER_SITE_OTHER): Payer: Medicare Other | Admitting: Neurology

## 2013-08-30 ENCOUNTER — Encounter: Payer: Self-pay | Admitting: Neurology

## 2013-08-30 DIAGNOSIS — R200 Anesthesia of skin: Secondary | ICD-10-CM

## 2013-08-30 DIAGNOSIS — R202 Paresthesia of skin: Secondary | ICD-10-CM

## 2013-08-30 DIAGNOSIS — Z0289 Encounter for other administrative examinations: Secondary | ICD-10-CM

## 2013-08-30 DIAGNOSIS — R209 Unspecified disturbances of skin sensation: Secondary | ICD-10-CM

## 2013-08-30 NOTE — Procedures (Signed)
   NCS (NERVE CONDUCTION STUDY) WITH EMG (ELECTROMYOGRAPHY) REPORT   STUDY DATE: August 10th 2015  PATIENT NAME: Anthony [name] DOB: 08-12-1945 MRN: [id number]    TECHNOLOGIST: Laretta Alstrom ELECTROMYOGRAPHER: Marcial Pacas M.D.  CLINICAL INFORMATION:  68 years old male, with past medical history of type 2 diabetes, presenting with couple years history of bilateral lower extremity discomfort or to move  On examination: Bilateral lower extremity motor strength was normal, no significant sensory loss.  FINDINGS: NERVE CONDUCTION STUDY: Bilateral peroneal sensory responses were normal. Bilateral peroneal EDB, and tibial motor responses were normal. bilateral tibial H. reflexes were normal and symmetric.  NEEDLE ELECTROMYOGRAPHY: Selected needle examination was performed at right lower extremity muscles, and right lumbosacral paraspinal muscles.  Needle examination of right tibialis anterior, tibialis posterior, medial gastrocnemius, peroneal longus, vastus lateralis, biceps femoris short head was normal  Needle examination of right abductor hallucis longus was normal, there was no spontaneous activity  There was no spontaneous activity at right lumbosacral paraspinal muscles, right L4-5 S1  IMPRESSION:   This is a normal study. There is no electrodiagnostic evidence of a large fiber peripheral neuropathy, or right lumbosacral radiculopathy.   INTERPRETING PHYSICIAN:   Marcial Pacas M.D. Ph.D. [facility] [address] [place], [place] [address] [phone]

## 2013-08-30 NOTE — Telephone Encounter (Signed)
I called the patient and reviewed the nerve conduction with EMG results with him. I explained that his test was normal however he can still have a small fiber neuropathy that would not show up on anerve conduction study/ EMG. He states that since we increase the gabapentin at night hsi discomfort has been controlled. I advised the patient to let us know if his discomfort level increases as the gabapentin can be increased further.

## 2013-10-06 ENCOUNTER — Encounter: Payer: Self-pay | Admitting: Neurology

## 2013-11-10 ENCOUNTER — Encounter (INDEPENDENT_AMBULATORY_CARE_PROVIDER_SITE_OTHER): Payer: Self-pay

## 2013-11-10 ENCOUNTER — Ambulatory Visit (INDEPENDENT_AMBULATORY_CARE_PROVIDER_SITE_OTHER): Payer: Medicare Other | Admitting: Adult Health

## 2013-11-10 ENCOUNTER — Encounter: Payer: Self-pay | Admitting: Adult Health

## 2013-11-10 VITALS — BP 143/77 | HR 59 | Wt 240.0 lb

## 2013-11-10 DIAGNOSIS — G4733 Obstructive sleep apnea (adult) (pediatric): Secondary | ICD-10-CM

## 2013-11-10 DIAGNOSIS — R202 Paresthesia of skin: Secondary | ICD-10-CM | POA: Insufficient documentation

## 2013-11-10 DIAGNOSIS — G609 Hereditary and idiopathic neuropathy, unspecified: Secondary | ICD-10-CM

## 2013-11-10 DIAGNOSIS — R200 Anesthesia of skin: Secondary | ICD-10-CM | POA: Insufficient documentation

## 2013-11-10 DIAGNOSIS — Z9989 Dependence on other enabling machines and devices: Principal | ICD-10-CM

## 2013-11-10 MED ORDER — GABAPENTIN 100 MG PO CAPS: ORAL_CAPSULE | ORAL | Status: DC

## 2013-11-10 NOTE — Patient Instructions (Signed)

## 2013-11-10 NOTE — Progress Notes (Signed)
PATIENT: Anthony [name] DOB: Jul 15, 1945  REASON FOR VISIT: follow up HISTORY FROM: patient  HISTORY OF PRESENT ILLNESS: Anthony [name] is a 68 year old male with a history of obstructive sleep Apnea and numbness and tingling in the lower extremities. He returns today for follow-up. He was started on gabapentin and reports that his discomfort has resolved. He would like to take 400 mg at night instead of 300 mg.  Patient states that he is sleeping well. There are some nights that he is restless. Continues to use the CPAP nightly. His prior download showed excellent compliance. He states that for over 4 years he has had numbness in the left hand in the last two fingers. He states that those fingers stay numb and sometimes during the night he will have some discomfort that will wake him up. He had neck surgery for fusion 5 years ago by Dr. Ronnald Ramp. He states that the numbness and discomfort is tolerable.   HISTORY: 68 year old male with history of obstructive sleep Apnea. He returns today for a 30 day compliance download. He brought his machine with him today and his reports shows an AHI of 4.2 at 8-13 cm of water, uses his machine for 8 hours and 38 minutes a night, with 100% compliance. His Epworth score is 13 points was previously 16 points. His fatigue severity score is 40 was previously 48. Patient reports that he gets about 7 hours of sleep a night. He goes to bed around 10-11pm and arises at 7:30am . He states that it usually takes between 30 minutes to 1 hour for him to fall asleep. States that the gets up about 2 times a night to urinate. Overall patient feels that CPAP has improved his sleepiness and fatigue. Since the last visit the patient has had no new medical issues.  Patient was taking Requip due to RLS but that was discontinued last visit and he was started on sinemet. He reports that the sinemet does not help at all. He states that during the day he has burning and tingling in the bottom of  the feet and notices this the most when he is sitting and resting. Patient states that when he wears his tight socks the discomfort is better. Patient is concerned because he is gaining weight but he is exercising daily. He states this started with Requip but did not improve with sinemet.    REVIEW OF SYSTEMS: Full 14 system review of systems performed and notable only for:  Constitutional: N/A  Eyes: eye discharge Ear/Nose/Throat: trouble swallowing Skin: N/A  Cardiovascular: leg swelling Respiratory: N/A  Gastrointestinal: N/A  Genitourinary: N/A Hematology/Lymphatic: N/A  Endocrine: excessive eating Musculoskeletal:back pain, muscle cramps, neck pain Allergy/Immunology: N/A  Neurological: N/A Psychiatric: N/A Sleep: restless leg, snoring   ALLERGIES: No Known Allergies  HOME MEDICATIONS: Outpatient Prescriptions Prior to Visit  Medication Sig Dispense Refill  . allopurinol (ZYLOPRIM) 300 MG tablet 1 tablet daily.      . carbidopa-levodopa (SINEMET IR) 25-100 MG per tablet Take 1 tablet by mouth daily.      . furosemide (LASIX) 40 MG tablet Take 40 mg by mouth daily as needed.       . gabapentin (NEURONTIN) 100 MG capsule Take 1 capsule in the morning and at noon. Take 3 capsules at bedtime.  150 capsule  3  . lisinopril (PRINIVIL,ZESTRIL) 40 MG tablet 1 tablet daily.      . metFORMIN (GLUCOPHAGE) 1000 MG tablet 1 tablet 2 (two)  times daily.      . mometasone (NASONEX) 50 MCG/ACT nasal spray Place 2 sprays into the nose daily.  17 g  12  . pantoprazole (PROTONIX) 40 MG tablet 1 tablet daily.      . predniSONE (DELTASONE) 10 MG tablet 1 tablet daily.      . simvastatin (ZOCOR) 40 MG tablet 1 tablet daily.      Marland Kitchen SYNTHROID 25 MCG tablet 1 tablet daily.      . tamsulosin (FLOMAX) 0.4 MG CAPS capsule 1 capsule daily.       No facility-administered medications prior to visit.    PAST MEDICAL HISTORY: Past Medical History  Diagnosis Date  . Diabetes mellitus type 2,  controlled   . HTN (hypertension)   . Hyperlipidemia   . Hypothyroid   . Gout   . BPH (benign prostatic hyperplasia)   . OSA on CPAP   . 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 Obesity (BMI 30.0-34.9) 05/14/2013    PAST SURGICAL HISTORY: Past Surgical History  Procedure Laterality Date  . Carpal tunnel release Left 2003  . Shoulder surgery Right 2003  . Carpal tunnel release Bilateral   . Nasal sinus surgery  1981  . Back surgery  10/2009    Cervical, arterior  . Cataract extraction  01/2012  . Retinal  06/2013    Retinal peel    FAMILY HISTORY: Family History  Problem Relation Age of Onset  . Colon cancer Father   . Colon cancer Maternal Grandfather   . Heart Problems Maternal Grandfather   . Diabetes Maternal Grandmother   . Lung cancer Brother     SOCIAL HISTORY: History   Social History  . Marital Status: Married    Spouse Name: [name]    Number of Children: 2  . Years of Education: 12   Occupational History  . [name]    Social History Main Topics  . Smoking status: Never Smoker   . Smokeless tobacco: Never Used  . Alcohol Use: No  . Drug Use: No  . Sexual Activity: Not on file   Other Topics Concern  . Not on file   Social History Narrative   Patient is married [name]) and lives at home with his wife.   Patient has two children (twins).   Patient is retired.   Patient has a high school education.   Patient does not drink any caffeine.   Patient is left-handed.      PHYSICAL EXAM  Filed Vitals:   11/10/13 1324  BP: 143/77  Pulse: 59  Weight: 240 lb (108.863 kg)   Body mass index is 34.67 kg/(m^2).  Generalized: Well developed, in no acute distress   Neurological examination  Mentation: Alert oriented to time, place, history taking. Follows all commands speech and language fluent Cranial nerve II-XII: Pupils were equal round reactive to light. Extraocular movements were full, visual field were full on confrontational test. Facial  sensation and strength were normal.  Uvula tongue midline. Head turning and shoulder shrug  were normal and symmetric. Motor: The motor testing reveals 5 over 5 strength of all 4 extremities. Good symmetric motor tone is noted throughout.  Sensory: Sensory testing is intact to soft touch on all 4 extremities. No evidence of extinction is noted.  Coordination: Cerebellar testing reveals good finger-nose-finger and heel-to-shin bilaterally.  Gait and station: Gait is normal. Tandem gait is normal. Romberg is negative. No drift is seen.  Reflexes: Deep tendon reflexes are symmetric and normal bilaterally.  DIAGNOSTIC DATA (LABS, IMAGING, TESTING) - I reviewed patient records, labs, notes, testing and imaging myself where available.  Lab Results  Component Value Date   WBC 9.5 11/10/2009   HGB 15.6 03/17/2010   HCT 46.0 03/17/2010   MCV 86.1 11/10/2009   PLT 302 11/10/2009      Component Value Date/Time   NA 138 03/17/2010 1145   K 3.6 03/17/2010 1145   CL 103 03/17/2010 1145   CO2 26 11/10/2009 1554   GLUCOSE 103* 03/17/2010 1145   BUN 13 03/17/2010 1145   CREATININE 0.9 03/17/2010 1145   CALCIUM 9.8 11/10/2009 1554   PROT 6.9 11/10/2009 1554   ALBUMIN 4.3 11/10/2009 1554   AST 27 11/10/2009 1554   ALT 31 11/10/2009 1554   ALKPHOS 66 11/10/2009 1554   BILITOT 0.4 11/10/2009 1554   GFRNONAA >60 11/10/2009 1554   GFRAA  Value: >60        The eGFR has been calculated using the MDRD equation. This calculation has not been validated in all clinical situations. eGFR's persistently <60 mL/min signify possible Chronic Kidney Disease. 11/10/2009 1554     ASSESSMENT AND PLAN 68 y.o. year old male  has a past medical history of Diabetes mellitus type 2, controlled; HTN (hypertension); Hyperlipidemia; Hypothyroid; Gout; BPH (benign prostatic hyperplasia); OSA on CPAP; Nasal septal deformity (05/14/2013); and Obesity (BMI 30.0-34.9) (05/14/2013). here with:  1. Peripheral neuropathy 2. OSA on  CPAP  Patient peripheral neuropathy as been controlled with gabapentin. It is thought that the neuropathy must be a small fiber neuropathy because previous NCS with EMG was normal. However is exam & history is consistent with a peripheral neuropathy. It sometimes has restless nights and has inquired if his nighttime dose can be increased. I will increase his nighttime dose to 400 mg. His prior CPAP download showed good compliance.   Ward Givens, MSN, NP-C 11/10/2013, 1:38 PM [facility] [address] [place], [place] [address] [phone]  Note: This document was prepared with digital dictation and possible smart phrase technology. Any transcriptional errors that result from this process are unintentional.

## 2013-11-11 NOTE — Progress Notes (Signed)
I agree with the assessment and plan as directed by NP .The patient is known to me .   Orchid Glassberg, MD  

## 2013-12-08 ENCOUNTER — Encounter: Payer: Self-pay | Admitting: Neurology

## 2013-12-14 ENCOUNTER — Encounter: Payer: Self-pay | Admitting: Neurology

## 2013-12-25 ENCOUNTER — Other Ambulatory Visit: Payer: Self-pay | Admitting: Adult Health

## 2014-04-29 ENCOUNTER — Other Ambulatory Visit: Payer: Self-pay | Admitting: Adult Health

## 2014-05-04 ENCOUNTER — Other Ambulatory Visit: Payer: Self-pay | Admitting: Adult Health

## 2014-05-12 ENCOUNTER — Ambulatory Visit: Payer: Medicare Other | Admitting: Adult Health

## 2014-05-23 ENCOUNTER — Other Ambulatory Visit: Payer: Self-pay | Admitting: Gastroenterology

## 2014-06-02 ENCOUNTER — Other Ambulatory Visit: Payer: Self-pay | Admitting: Adult Health

## 2014-06-29 ENCOUNTER — Encounter (HOSPITAL_COMMUNITY): Payer: Self-pay | Admitting: *Deleted

## 2014-06-29 ENCOUNTER — Other Ambulatory Visit: Payer: Self-pay | Admitting: Adult Health

## 2014-07-14 NOTE — H&P (Signed)
   Anthony [name] HPI: Two weeks ago the patient acutely started to have issues with "indigestion" and his abdomen started to tighten. He took an over-the-counter antacid and his symptoms continued to progress. His pain radiated to the back and he had some one bout of nausea and vomiting. The patient was evaluated by Dr. Osborne Casco and a CT scan was performed with findings of a pancreatitis. He was also enrolled in a study for his DM and his medication was stopped. Currently he feels better, but he has a baseline level of symptoms. Hospitalization was not offered to the patient, but pain medication was prescribed for him. However, he did not fill the prescription as there is a family history of drug addition in two of his sisters. Per the patient's report, there is no evidence of any gallstones.  Past Medical History  Diagnosis Date  . Diabetes mellitus type 2, controlled   . HTN (hypertension)   . Hyperlipidemia   . Hypothyroid   . Gout     last flare up last week right ankle   . BPH (benign prostatic hyperplasia)   . 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 Obesity (BMI 30.0-34.9) 05/14/2013  . OSA on CPAP     cpap setting of 10/ 13  . Pneumonia 12 years ago  . GERD (gastroesophageal reflux disease)   . Fatty liver   . Pancreatitis dx march 2016    Past Surgical History  Procedure Laterality Date  . Carpal tunnel release Left 2003  . Shoulder surgery Right 2003  . Carpal tunnel release Bilateral   . Nasal sinus surgery  1981  . Cataract extraction Left 01/2012  . Retinal Left 06/2013    Retinal peel  . Back surgery  10/2009    Cervical, arterior    Family History  Problem Relation Age of Onset  . Colon cancer Father   . Colon cancer Maternal Grandfather   . Heart Problems Maternal Grandfather   . Diabetes Maternal Grandmother   . Lung cancer Brother     Social History:  reports that he has never smoked. He has never used smokeless tobacco. He reports that he does not drink  alcohol or use illicit drugs.  Allergies:  Allergies  Allergen Reactions  . Carbidopa-Levodopa Other (See Comments)    Made skin crawl     Medications: Scheduled: Continuous:  No results found for this or any previous visit (from the past 24 hour(s)).   No results found.  ROS:  As stated above in the HPI otherwise negative.  There were no vitals taken for this visit.    PE: Gen: NAD, Alert and Oriented HEENT:  [place]/AT, EOMI Neck: Supple, no LAD Lungs: CTA Bilaterally CV: RRR without M/G/R ABM: Soft, NTND, +BS Ext: No C/C/E  Assessment/Plan: 1) Idiopathic pancreatitis - EUS for further evaluation.  Meighan Treto D 07/14/2014, 3:18 PM

## 2014-07-15 ENCOUNTER — Ambulatory Visit (HOSPITAL_COMMUNITY): Payer: Medicare Other | Admitting: Anesthesiology

## 2014-07-15 ENCOUNTER — Encounter (HOSPITAL_COMMUNITY): Payer: Self-pay | Admitting: Anesthesiology

## 2014-07-15 ENCOUNTER — Ambulatory Visit (HOSPITAL_COMMUNITY)
Admission: RE | Admit: 2014-07-15 | Discharge: 2014-07-15 | Disposition: A | Discharge status: Home / Self Care | Payer: Medicare Other | Source: Ambulatory Visit | Attending: Gastroenterology | Admitting: Gastroenterology

## 2014-07-15 ENCOUNTER — Encounter (HOSPITAL_COMMUNITY)
Admission: RE | Disposition: A | Discharge status: Home / Self Care | Payer: Self-pay | Source: Ambulatory Visit | Attending: Gastroenterology

## 2014-07-15 DIAGNOSIS — E119 Type 2 diabetes mellitus without complications: Secondary | ICD-10-CM | POA: Insufficient documentation

## 2014-07-15 DIAGNOSIS — Z888 Allergy status to other drugs, medicaments and biological substances status: Secondary | ICD-10-CM | POA: Diagnosis not present

## 2014-07-15 DIAGNOSIS — Z801 Family history of malignant neoplasm of trachea, bronchus and lung: Secondary | ICD-10-CM | POA: Diagnosis not present

## 2014-07-15 DIAGNOSIS — K760 Fatty (change of) liver, not elsewhere classified: Secondary | ICD-10-CM | POA: Diagnosis not present

## 2014-07-15 DIAGNOSIS — M109 Gout, unspecified: Secondary | ICD-10-CM | POA: Diagnosis not present

## 2014-07-15 DIAGNOSIS — Z6835 Body mass index (BMI) 35.0-35.9, adult: Secondary | ICD-10-CM | POA: Insufficient documentation

## 2014-07-15 DIAGNOSIS — K219 Gastro-esophageal reflux disease without esophagitis: Secondary | ICD-10-CM | POA: Diagnosis not present

## 2014-07-15 DIAGNOSIS — I10 Essential (primary) hypertension: Secondary | ICD-10-CM | POA: Insufficient documentation

## 2014-07-15 DIAGNOSIS — E039 Hypothyroidism, unspecified: Secondary | ICD-10-CM | POA: Diagnosis not present

## 2014-07-15 DIAGNOSIS — K850 Idiopathic acute pancreatitis: Secondary | ICD-10-CM | POA: Insufficient documentation

## 2014-07-15 DIAGNOSIS — E785 Hyperlipidemia, unspecified: Secondary | ICD-10-CM | POA: Insufficient documentation

## 2014-07-15 DIAGNOSIS — E669 Obesity, unspecified: Secondary | ICD-10-CM | POA: Diagnosis not present

## 2014-07-15 DIAGNOSIS — Z800 Family history of malignant neoplasm of digestive organs: Secondary | ICD-10-CM | POA: Diagnosis not present

## 2014-07-15 DIAGNOSIS — G4730 Sleep apnea, unspecified: Secondary | ICD-10-CM | POA: Diagnosis not present

## 2014-07-15 DIAGNOSIS — N400 Enlarged prostate without lower urinary tract symptoms: Secondary | ICD-10-CM | POA: Insufficient documentation

## 2014-07-15 HISTORY — DX: Acute pancreatitis without necrosis or infection, unspecified: K85.90

## 2014-07-15 HISTORY — PX: EUS: SHX5427

## 2014-07-15 HISTORY — DX: Pneumonia, unspecified organism: J18.9

## 2014-07-15 HISTORY — DX: Fatty (change of) liver, not elsewhere classified: K76.0

## 2014-07-15 HISTORY — DX: Gastro-esophageal reflux disease without esophagitis: K21.9

## 2014-07-15 LAB — GLUCOSE, CAPILLARY: Glucose-Capillary: 99 mg/dL (ref 65–99)

## 2014-07-15 SURGERY — ULTRASOUND, UPPER GI TRACT, ENDOSCOPIC
Anesthesia: Monitor Anesthesia Care

## 2014-07-15 MED ORDER — SODIUM CHLORIDE 0.9 % IV SOLN: INTRAVENOUS | Status: DC

## 2014-07-15 MED ORDER — LACTATED RINGERS IV SOLN
INTRAVENOUS | Status: DC | PRN
  Administered 2014-07-15: 1000 mL
  Administered 2014-07-15: 07:00:00 via INTRAVENOUS

## 2014-07-15 MED ORDER — ONDANSETRON HCL 4 MG/2ML IJ SOLN
INTRAMUSCULAR | Status: DC | PRN
  Administered 2014-07-15: 4 mg via INTRAVENOUS

## 2014-07-15 MED ORDER — PROPOFOL INFUSION 10 MG/ML OPTIME
INTRAVENOUS | Status: DC | PRN
  Administered 2014-07-15: 100 ug/kg/min via INTRAVENOUS

## 2014-07-15 MED ORDER — PROPOFOL 10 MG/ML IV BOLUS
INTRAVENOUS | Status: AC
  Filled 2014-07-15: qty 20

## 2014-07-15 NOTE — Anesthesia Postprocedure Evaluation (Signed)
  Anesthesia Post-op Note  Patient: Anthony [name]  Procedure(s) Performed: Procedure(s) (LRB): FULL UPPER ENDOSCOPIC ULTRASOUND (EUS) RADIAL (N/A)  Patient Location: PACU  Anesthesia Type: MAC  Level of Consciousness: awake and alert   Airway and Oxygen Therapy: Patient Spontanous Breathing  Post-op Pain: mild  Post-op Assessment: Post-op Vital signs reviewed, Patient's Cardiovascular Status Stable, Respiratory Function Stable, Patent Airway and No signs of Nausea or vomiting  Last Vitals:  Filed Vitals:   07/15/14 0900  BP: 136/89  Pulse: 65  Resp: 19    Post-op Vital Signs: stable   Complications: No apparent anesthesia complications

## 2014-07-15 NOTE — Anesthesia Preprocedure Evaluation (Addendum)
Anesthesia Evaluation  Patient identified by MRN, date of birth, ID band Patient awake    Reviewed: Allergy & Precautions, NPO status , Patient's Chart, lab work & pertinent test results  Airway Mallampati: II  TM Distance: >3 FB Neck ROM: Full    Dental no notable dental hx.    Pulmonary sleep apnea and Continuous Positive Airway Pressure Ventilation , pneumonia -, resolved,  breath sounds clear to auscultation  Pulmonary exam normal       Cardiovascular Exercise Tolerance: Good hypertension, Pt. on medications Normal cardiovascular examRhythm:Regular Rate:Normal     Neuro/Psych negative neurological ROS  negative psych ROS   GI/Hepatic Neg liver ROS, GERD-  Medicated,  Endo/Other  diabetes, Type 2, Oral Hypoglycemic AgentsHypothyroidism   Renal/GU negative Renal ROS  negative genitourinary   Musculoskeletal negative musculoskeletal ROS (+)   Abdominal (+) + obese,   Peds negative pediatric ROS (+)  Hematology negative hematology ROS (+)   Anesthesia Other Findings   Reproductive/Obstetrics negative OB ROS                            Anesthesia Physical Anesthesia Plan  ASA: III  Anesthesia Plan: MAC   Post-op Pain Management:    Induction: Intravenous  Airway Management Planned: Natural Airway  Additional Equipment:   Intra-op Plan:   Post-operative Plan:   Informed Consent: I have reviewed the patients History and Physical, chart, labs and discussed the procedure including the risks, benefits and alternatives for the proposed anesthesia with the patient or authorized representative who has indicated his/her understanding and acceptance.   Dental advisory given  Plan Discussed with: CRNA  Anesthesia Plan Comments:         Anesthesia Quick Evaluation

## 2014-07-15 NOTE — Op Note (Signed)
[facility] [place], [address]   UPPER ENDOSCOPIC ULTRASOUND PROCEDURE REPORT     EXAM DATE: 07/15/2014  PATIENT NAME:          Anthony [name], Anthony [name]          MR#: [id number] BIRTHDATE:       1945-03-27     VISIT #:     [phone] ATTENDING:     Carol Ada, MD     STATUS:     outpatient ASSISTANT:      Cherylynn Ridges and Carolynn Comment  REFERRING MD: ASA CLASS:        Class III  INDICATIONS:  The patient is a 69 yr old male here for a lower endoscopic ultrasound due to idiopathic pancreatitis. PROCEDURE PERFORMED:     Upper EUS  MEDICATIONS:     Monitored anesthesia care  CONSENT: The patient understands the risks and benefits of the procedure and understands that these risks include, but are not limited to: sedation, allergic reaction, infection, perforation and/or bleeding. Alternative means of evaluation and treatment include, among others: physical exam, x-rays, and/or surgical intervention. The patient elects to proceed with this endoscopic procedure.  DESCRIPTION OF PROCEDURE: During intra-op preparation period all mechanical & medical equipment was checked for proper function. Hand hygiene and appropriate measures for infection prevention was taken. After the risks, benefits and alternatives of the procedure were thoroughly explained, Informed consent was verified, confirmed and timeout was successfully executed by the treatment team. The patient was then placed in the left, lateral, decubitus position and IV sedation was administered. Throughout the procedure, the patients blood pressure, pulse and oxygen saturations were monitored continuously. Under direct visualization, the Pentax EUS Linear [id number] was introduced through the mouth and advanced to the second portion of the duodenum.  Water was used as necessary to provide an acoustic interface. The pulse, BP, and O2 saturation were monitored and documented by the physician  and nursing staff throughout the procedure. Upon completion of the imaging, water was removed and the patient was then discharged to recovery in stable condition with the appropriate post procedure care. Estimated blood loss is zero unless otherwise noted in this procedure report.   FINDINGS: The pancreas was visualized, but the overall image quality was not as optimal.  There was a diffuse haziness throughout the entire examination.  No evidence of any overt masses in the pancreas or cysts.  A clear view of the gallbladder was obtained and it was negative for any stones or sludge.  The CBD was not visualized because of the anatomy.  It was difficult to hold a position in the duodenal bulb.  There was no evidence of any overt PD dilation.  In fact, the PD was difficult to visualize in the head and body of the pancreas.  Fatty liver was noted.    ADVERSE EVENTS:     There were no immediate complications. IMPRESSIONS:     1) Normal pancreas. 2) Fatty liver. 3) Normal gallbladder.  RECOMMENDATIONS:     1) Follow up PRN.  ___________________________________ Carol Ada, MD eSigned:  Carol Ada, MD 07/15/2014 9:07 AM   cc:      PATIENT NAME:  Anthony [name], Anthony [name] MR#: [id number]

## 2014-07-15 NOTE — Discharge Instructions (Signed)
Esophagogastroduodenoscopy °Esophagogastroduodenoscopy (EGD) is a procedure to examine the lining of the esophagus, stomach, and first part of the small intestine (duodenum). A long, flexible, lighted tube with a camera attached (endoscope) is inserted down the throat to view these organs. This procedure is done to detect problems or abnormalities, such as inflammation, bleeding, ulcers, or growths, in order to treat them. The procedure lasts about 5-20 minutes. It is usually an outpatient procedure, but it may need to be performed in emergency cases in the hospital. °LET YOUR CAREGIVER KNOW ABOUT:  °· Allergies to food or medicine. °· All medicines you are taking, including vitamins, herbs, eyedrops, and over-the-counter medicines and creams. °· Use of steroids (by mouth or creams). °· Previous problems you or members of your family have had with the use of anesthetics. °· Any blood disorders you have. °· Previous surgeries you have had. °· Other health problems you have. °· Possibility of pregnancy, if this applies. °RISKS AND COMPLICATIONS  °Generally, EGD is a safe procedure. However, as with any procedure, complications can occur. Possible complications include: °· Infection. °· Bleeding. °· Tearing (perforation) of the esophagus, stomach, or duodenum. °· Difficulty breathing or not being able to breath. °· Excessive sweating. °· Spasms of the larynx. °· Slowed heartbeat. °· Low blood pressure. °BEFORE THE PROCEDURE °· Do not eat or drink anything for 6-8 hours before the procedure or as directed by your caregiver. °· Ask your caregiver about changing or stopping your regular medicines. °· If you wear dentures, be prepared to remove them before the procedure. °· Arrange for someone to drive you home after the procedure. °PROCEDURE  °· A vein will be accessed to give medicines and fluids. A medicine to relax you (sedative) and a pain reliever will be given through that access into the vein. °· A numbing medicine  (local anesthetic) may be sprayed on your throat for comfort and to stop you from gagging or coughing. °· A mouth guard may be placed in your mouth to protect your teeth and to keep you from biting on the endoscope. °· You will be asked to lie on your left side. °· The endoscope is inserted down your throat and into the esophagus, stomach, and duodenum. °· Air is put through the endoscope to allow your caregiver to view the lining of your esophagus clearly. °· The esophagus, stomach, and duodenum is then examined. During the exam, your caregiver may: °¨ Remove tissue to be examined under a microscope (biopsy) for inflammation, infection, or other medical problems. °¨ Remove growths. °¨ Remove objects (foreign bodies) that are stuck. °¨ Treat any bleeding with medicines or other devices that stop tissues from bleeding (hot cautery, clipping devices). °¨ Widen (dilate) or stretch narrowed areas of the esophagus and stomach. °· The endoscope will then be withdrawn. °AFTER THE PROCEDURE °· You will be taken to a recovery area to be monitored. You will be able to go home once you are stable and alert. °· Do not eat or drink anything until the local anesthetic and numbing medicines have worn off. You may choke. °· It is normal to feel bloated, have pain with swallowing, or have a sore throat for a short time. This will wear off. °· Your caregiver should be able to discuss his or her findings with you. It will take longer to discuss the test results if any biopsies were taken. °Document Released: 05/10/2004 Document Revised: 05/24/2013 Document Reviewed: 12/11/2011 °[employer]® Patient Information ©2015 [employer]. This information is not   intended to replace advice given to you by your health care provider. Make sure you discuss any questions you have with your health care provider. ° °

## 2014-07-15 NOTE — Transfer of Care (Signed)
Immediate Anesthesia Transfer of Care Note  Patient: Anthony [name]  Procedure(s) Performed: Procedure(s): FULL UPPER ENDOSCOPIC ULTRASOUND (EUS) RADIAL (N/A)  Patient Location: PACU  Anesthesia Type:MAC  Level of Consciousness:  sedated, patient cooperative and responds to stimulation  Airway & Oxygen Therapy:Patient Spontanous Breathing and Patient connected to face mask oxgen  Post-op Assessment:  Report given to PACU RN and Post -op Vital signs reviewed and stable  Post vital signs:  Reviewed and stable  Last Vitals:  Filed Vitals:   07/15/14 0732  BP: 165/89  Pulse: 75  Resp: 20    Complications: No apparent anesthesia complications

## 2014-07-18 ENCOUNTER — Encounter (HOSPITAL_COMMUNITY): Payer: Self-pay | Admitting: Gastroenterology

## 2014-08-23 ENCOUNTER — Encounter: Payer: Self-pay | Admitting: Adult Health

## 2014-08-23 ENCOUNTER — Ambulatory Visit (INDEPENDENT_AMBULATORY_CARE_PROVIDER_SITE_OTHER): Payer: Medicare Other | Admitting: Adult Health

## 2014-08-23 VITALS — BP 153/89 | HR 66 | Ht 70.0 in | Wt 209.0 lb

## 2014-08-23 DIAGNOSIS — R200 Anesthesia of skin: Secondary | ICD-10-CM | POA: Diagnosis not present

## 2014-08-23 DIAGNOSIS — G4733 Obstructive sleep apnea (adult) (pediatric): Secondary | ICD-10-CM

## 2014-08-23 DIAGNOSIS — R202 Paresthesia of skin: Secondary | ICD-10-CM

## 2014-08-23 MED ORDER — GABAPENTIN 100 MG PO CAPS: ORAL_CAPSULE | ORAL | Status: DC

## 2014-08-23 NOTE — Patient Instructions (Addendum)
Continue using CPAP nightly increase gabapentin to 500 mg at bedtime and continue 1 tablet in the morning and at noon.  Consult with your PCP regarding new symptom of night sweats.  If your symptoms worsen or you develop new symptoms please let us know.

## 2014-08-23 NOTE — Progress Notes (Signed)
PATIENT: Anthony [name] DOB: April 23, 1945  REASON FOR VISIT: follow up- obstructive sleep apnea on CPAP, numbness and tingling in lower extremities HISTORY FROM: patient  HISTORY OF PRESENT ILLNESS: [name] is a 69 year old male with a history of obstructive sleep apnea and numbness and tingling in the lower extremities. He returns today for follow-up. The patient's CPAP download indicates that he uses machine 30 out of 30 days for compliance 100%. He uses machine greater than 4 hours 29 out of 30 days for compliance of 97%. On average he uses machine 7 hours and 2 minutes. His AHI is 3 with a minimum pressure of 8 cm of water and maximum pressure of 13 cm of water with EPR of 2. The patient does not have a significant leak. Overall patient feels that the CPAP has been beneficial however he states in the last 5-6 weeks he has not been sleeping as well. He states that he wakes up with his legs hurting and has been having night sweats. The patient has an appointment tomorrow with his primary care provider to talk about the night sweats. His Epworth score today is 16 and fatigue severity score is 39. He uses gabapentin 100 mg in the morning and at noon and 400 mg at bedtime. Initially this worked well for his symptoms. However he states that now he is waking up in the middle the night with his legs "aching.". He denies any new symptoms. Since the last visit the patient was diagnosed with pancreatitis and an enlarged liver. He returns today for an evaluation.  HISTORY 11/10/13: [name] is a 69 year old male with a history of obstructive sleep Apnea and numbness and tingling in the lower extremities. He returns today for follow-up. He was started on gabapentin and reports that his discomfort has resolved. He would like to take 400 mg at night instead of 300 mg. Patient states that he is sleeping well. There are some nights that he is restless. Continues to use the CPAP nightly. His prior download  showed excellent compliance. He states that for over 4 years he has had numbness in the left hand in the last two fingers. He states that those fingers stay numb and sometimes during the night he will have some discomfort that will wake him up. He had neck surgery for fusion 5 years ago by Dr. Ronnald Ramp. He states that the numbness and discomfort is tolerable.   HISTORY: 69 year old male with history of obstructive sleep Apnea. He returns today for a 30 day compliance download. He brought his machine with him today and his reports shows an AHI of 4.2 at 8-13 cm of water, uses his machine for 8 hours and 38 minutes a night, with 100% compliance. His Epworth score is 13 points was previously 16 points. His fatigue severity score is 40 was previously 48. Patient reports that he gets about 7 hours of sleep a night. He goes to bed around 10-11pm and arises at 7:30am . He states that it usually takes between 30 minutes to 1 hour for him to fall asleep. States that the gets up about 2 times a night to urinate. Overall patient feels that CPAP has improved his sleepiness and fatigue. Since the last visit the patient has had no new medical issues.  Patient was taking Requip due to RLS but that was discontinued last visit and he was started on sinemet. He reports that the sinemet does not help at all. He states that  during the day he has burning and tingling in the bottom of the feet and notices this the most when he is sitting and resting. Patient states that when he wears his tight socks the discomfort is better. Patient is concerned because he is gaining weight but he is exercising daily. He states this started with Requip but did not improve with sinemet.    REVIEW OF SYSTEMS: Out of a complete 14 system review of symptoms, the patient complains only of the following symptoms, and all other reviewed systems are negative.  Excessive eating, excessive sweating, leg swelling, restless leg  ALLERGIES: Allergies    Allergen Reactions  . Carbidopa-Levodopa Other (See Comments)    Made skin crawl     HOME MEDICATIONS: Outpatient Prescriptions Prior to Visit  Medication Sig Dispense Refill  . allopurinol (ZYLOPRIM) 300 MG tablet Take 300 mg by mouth daily.     . furosemide (LASIX) 40 MG tablet Take 40 mg by mouth daily.    Marland Kitchen gabapentin (NEURONTIN) 100 MG capsule TAKE 1 CAPSULE BY MOUTH IN THE MORNING, 1 AT NOON, AND 4 AT BEDTIME (Patient not taking: Reported on 06/28/2014) 180 capsule 0  . gabapentin (NEURONTIN) 100 MG capsule TAKE ONE CAPSULE BY MOUTH IN THE MORNING,1 CAPSULE AT NOON,AND 4 CAPSULES AT BEDTIME 60 capsule 0  . Investigational - Study Medication Take 5 tablets by mouth every morning.    Marland Kitchen lisinopril (PRINIVIL,ZESTRIL) 40 MG tablet Take 1 tablet by mouth daily.     . metFORMIN (GLUCOPHAGE) 1000 MG tablet Take 1 tablet by mouth 2 (two) times daily.     . mometasone (NASONEX) 50 MCG/ACT nasal spray Place 2 sprays into the nose daily. 17 g 12  . pantoprazole (PROTONIX) 40 MG tablet Take 40 mg by mouth daily.     . simvastatin (ZOCOR) 40 MG tablet Take 40 mg by mouth every evening.     Marland Kitchen SYNTHROID 25 MCG tablet Take 25 mcg by mouth daily.     . tamsulosin (FLOMAX) 0.4 MG CAPS capsule Take 0.4 mg by mouth every evening.      No facility-administered medications prior to visit.    PAST MEDICAL HISTORY: Past Medical History  Diagnosis Date  . Diabetes mellitus type 2, controlled   . HTN (hypertension)   . Hyperlipidemia   . Hypothyroid   . Gout     last flare up last week right ankle   . BPH (benign prostatic hyperplasia)   . 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 Obesity (BMI 30.0-34.9) 05/14/2013  . OSA on CPAP     cpap setting of 10/ 13  . Pneumonia 12 years ago  . GERD (gastroesophageal reflux disease)   . Fatty liver   . Pancreatitis dx march 2016    PAST SURGICAL HISTORY: Past Surgical History  Procedure Laterality Date  . Carpal tunnel release Left 2003  . Shoulder surgery  Right 2003  . Carpal tunnel release Bilateral   . Nasal sinus surgery  1981  . Cataract extraction Left 01/2012  . Retinal Left 06/2013    Retinal peel  . Back surgery  10/2009    Cervical, arterior  . Eus N/A 07/15/2014    Procedure: FULL UPPER ENDOSCOPIC ULTRASOUND (EUS) RADIAL;  Surgeon: Carol Ada, MD;  Location: [facility];  Service: Endoscopy;  Laterality: N/A;    FAMILY HISTORY: Family History  Problem Relation Age of Onset  . Colon cancer Father   . Colon cancer Maternal Grandfather   . Heart Problems  Maternal Grandfather   . Diabetes Maternal Grandmother   . Lung cancer Brother     SOCIAL HISTORY: History   Social History  . Marital Status: Married    Spouse Name: [name]  . Number of Children: 2  . Years of Education: 12   Occupational History  . [employer]    Social History Main Topics  . Smoking status: Never Smoker   . Smokeless tobacco: Never Used  . Alcohol Use: No  . Drug Use: No  . Sexual Activity: Not on file   Other Topics Concern  . Not on file   Social History Narrative   Patient is married [name]) and lives at home with his wife.   Patient has two children (twins).   Patient is retired.   Patient has a high school education.   Patient does not drink any caffeine.   Patient is left-handed.      PHYSICAL EXAM  Filed Vitals:   08/23/14 0727  BP: 153/89  Pulse: 66  Height: 5\' 10"  (1.778 m)  Weight: 209 lb (94.802 kg)   Body mass index is 29.99 kg/(m^2).  Generalized: Well developed, in no acute distress  Neck: Circumference 20-1/2 inches, Mallampati 3+  Neurological examination  Mentation: Alert oriented to time, place, history taking. Follows all commands speech and language fluent Cranial nerve II-XII: Pupils were equal round reactive to light. Extraocular movements were full, visual field were full on confrontational test. Facial sensation and strength were normal. Uvula tongue midline. Head turning and shoulder shrug  were  normal and symmetric. Motor: The motor testing reveals 5 over 5 strength of all 4 extremities. Good symmetric motor tone is noted throughout.  Sensory: Sensory testing is intact to soft touch on all 4 extremities. No evidence of extinction is noted.  Coordination: Cerebellar testing reveals good finger-nose-finger and heel-to-shin bilaterally.  Gait and station: Gait is normal. Tandem gait is normal. Romberg is negative. No drift is seen.  Reflexes: Deep tendon reflexes are symmetric and normal bilaterally.   DIAGNOSTIC DATA (LABS, IMAGING, TESTING) - I reviewed patient records, labs, notes, testing and imaging myself where available.     ASSESSMENT AND PLAN 69 y.o. year old male  has a past medical history of Diabetes mellitus type 2, controlled; HTN (hypertension); Hyperlipidemia; Hypothyroid; Gout; BPH (benign prostatic hyperplasia); Nasal septal deformity (05/14/2013); Obesity (BMI 30.0-34.9) (05/14/2013); OSA on CPAP; Pneumonia (12 years ago); GERD (gastroesophageal reflux disease); Fatty liver; and Pancreatitis (dx march 2016). here with:  1. Obstructive sleep apnea on CPAP 2. Numbness and tingling in the lower extremities  The patient CPAP download was excellent. He should continue using the CPAP nightly. I will increase his gabapentin to 500 mg at bedtime. He will continue taking 100 mg in the morning and at noon. He was advised to let us know if this resolves his discomfort at night. I encouraged the patient to follow-up with his primary care provider about new symptom of night sweats. Patient verbalized understanding. If the patient's symptoms worsen or he develops new symptoms he should let us know. Otherwise he will follow-up in 6 months or sooner if needed.     Ward Givens, MSN, NP-C 08/23/2014, 7:33 AM [facility] [address] [place], [place] [address] [phone]  Note: This document was prepared with digital dictation and possible smart  phrase technology. Any transcriptional errors that result from this process are unintentional.

## 2014-09-12 ENCOUNTER — Telehealth: Payer: Self-pay | Admitting: Adult Health

## 2014-09-12 MED ORDER — GABAPENTIN 100 MG PO CAPS: ORAL_CAPSULE | ORAL | Status: DC

## 2014-09-12 NOTE — Telephone Encounter (Signed)
I called the patient. He reports that he's been having increased discomfort at bedtime the last week. He is currently taken 500 mg at bedtime 100 mg in the morning and at noon. I will increase his bedtime dose to 600 mg. he will let me know if this is beneficial.

## 2014-09-12 NOTE — Telephone Encounter (Signed)
Patient called states gabapentin (NEURONTIN) 100 MG capsule  Megan mentioned increasing from 100mg  to he thinks 500mg  if 100mg  wasn't working. Pt says 100mg  isn't working.

## 2014-10-14 ENCOUNTER — Telehealth: Payer: Self-pay | Admitting: Adult Health

## 2014-10-14 MED ORDER — GABAPENTIN 300 MG PO CAPS: 600.0000 mg | ORAL_CAPSULE | Freq: Every day | ORAL | Status: DC

## 2014-10-14 NOTE — Telephone Encounter (Signed)
Pt called to state that the gabapentin (NEURONTIN) 100 MG capsule, is working well , except he is taking 7 capsules at night per instructions from NP. Pt needs new rx to [facility].

## 2014-10-14 NOTE — Telephone Encounter (Signed)
I called the patient. He would like to switch to the 300 mg tablets. He doesn't feel that he needs to take gabapentin during the day only at bedtime. I will send in new prescription for gabapentin 600 mg at bedtime.

## 2014-10-17 ENCOUNTER — Telehealth: Payer: Self-pay | Admitting: Adult Health

## 2014-10-17 MED ORDER — GABAPENTIN 300 MG PO CAPS: 600.0000 mg | ORAL_CAPSULE | Freq: Every day | ORAL | Status: DC

## 2014-10-17 NOTE — Telephone Encounter (Signed)
Called patient and left him a message asking him to return my phone call.

## 2014-10-17 NOTE — Telephone Encounter (Signed)
Patient has been called see previous note.

## 2014-10-17 NOTE — Addendum Note (Signed)
Addended by: Trudie Buckler on: 10/17/2014 07:55 AM   Modules accepted: Orders

## 2014-10-17 NOTE — Telephone Encounter (Signed)
Called and spoke to patient about New script for his gabapentin 300 mg two tablets at bedtime. Patient understood . Patient Has to renew his script every year  as far as his CPAP supplies and   [facility].

## 2014-10-17 NOTE — Telephone Encounter (Signed)
Patient returned your call today and may be reached at [phone].

## 2014-10-20 ENCOUNTER — Telehealth: Payer: Self-pay | Admitting: Neurology

## 2014-10-20 ENCOUNTER — Other Ambulatory Visit: Payer: Self-pay

## 2014-10-20 DIAGNOSIS — G4733 Obstructive sleep apnea (adult) (pediatric): Secondary | ICD-10-CM

## 2014-10-20 DIAGNOSIS — Z9989 Dependence on other enabling machines and devices: Principal | ICD-10-CM

## 2014-10-20 NOTE — Telephone Encounter (Signed)
Pt called requesting RX for nose pillow/small for CPAP machine. He originally uses small but it has worn out.Presently pt is using medium size and it is making his nose sore.  Please send to [facility] on [facility]. Please call and advise. Patient can be reached at [phone].

## 2014-10-20 NOTE — Telephone Encounter (Signed)
Called pt to tell him that we sent [facility] an order to a small nasal pillow.  No answer, left a message asking him to call back.

## 2014-10-20 NOTE — Telephone Encounter (Signed)
Pt called back. He was notified that [facility] was sent order. Pt expressed understanding

## 2014-10-24 NOTE — Telephone Encounter (Signed)
Confirmed with [facility] that they had received the cpap order. They confirmed this last week, but were unable to find the order today. Faxed the order again to them. I called and explained this to the pt. Pt verbalized understanding.

## 2014-10-24 NOTE — Telephone Encounter (Addendum)
Pt called and states that he called [facility] this afternoon about his cpap equipment. They are telling him that they never received an order from this office. May call pt [phone]. Thank you

## 2014-10-28 ENCOUNTER — Telehealth: Payer: Self-pay | Admitting: *Deleted

## 2014-10-28 DIAGNOSIS — G4733 Obstructive sleep apnea (adult) (pediatric): Secondary | ICD-10-CM

## 2014-10-28 DIAGNOSIS — Z9989 Dependence on other enabling machines and devices: Principal | ICD-10-CM

## 2014-10-28 NOTE — Telephone Encounter (Signed)
Will send to RN Dinkins to resend order to [facility], with the same settings. Anthony [name].

## 2014-10-31 ENCOUNTER — Telehealth: Payer: Self-pay

## 2014-10-31 NOTE — Telephone Encounter (Signed)
[facility] confirmed with me today that they had ordered the pt's nasal cushions.

## 2014-10-31 NOTE — Telephone Encounter (Signed)
[facility] contacted me and said that the form had actually already been sent to them signed and completed and so the cushions were ordered today for pt.

## 2014-10-31 NOTE — Telephone Encounter (Signed)
Spoke to [facility]. They need a SMN form completed and signed by [place] before they can give pt supplies. I asked [facility] to fax it directly to me and I would fax it back completed and signed.  I called pt to inform him. No answer, left a message asking him to call me back.

## 2014-10-31 NOTE — Telephone Encounter (Signed)
Pt returned call, he was informed of form to be faxed and filled out by this office, as per prior Tele note.

## 2015-01-17 ENCOUNTER — Other Ambulatory Visit: Payer: Self-pay | Admitting: Adult Health

## 2015-02-23 ENCOUNTER — Encounter: Payer: Self-pay | Admitting: Adult Health

## 2015-02-23 ENCOUNTER — Ambulatory Visit (INDEPENDENT_AMBULATORY_CARE_PROVIDER_SITE_OTHER): Payer: PPO | Admitting: Adult Health

## 2015-02-23 VITALS — BP 145/80 | HR 65 | Ht 70.0 in | Wt 240.5 lb

## 2015-02-23 DIAGNOSIS — Z9989 Dependence on other enabling machines and devices: Principal | ICD-10-CM

## 2015-02-23 DIAGNOSIS — G4733 Obstructive sleep apnea (adult) (pediatric): Secondary | ICD-10-CM

## 2015-02-23 DIAGNOSIS — E1142 Type 2 diabetes mellitus with diabetic polyneuropathy: Secondary | ICD-10-CM | POA: Diagnosis not present

## 2015-02-23 MED ORDER — GABAPENTIN 300 MG PO CAPS: 900.0000 mg | ORAL_CAPSULE | Freq: Every day | ORAL | Status: DC

## 2015-02-23 NOTE — Progress Notes (Signed)
I agree with the assessment and plan as directed by NP .The patient is known to me .   Shara Hartis, MD  

## 2015-02-23 NOTE — Progress Notes (Signed)
I agree with the assessment and plan as directed by NP .The patient is known to me .   Araseli Sherry, MD  

## 2015-02-23 NOTE — Patient Instructions (Signed)
Increase gabapentin to 900 mg (3 tablets ) at bedtime- Can try taking 1 tablet at dinnertime and 2 tablets before bed.  Continue using the CPAP nightly If your symptoms worsen or you develop new symptoms please let us know.

## 2015-02-23 NOTE — Progress Notes (Signed)
PATIENT: Anthony [name] DOB: [birth year]  REASON FOR VISIT: follow up- OSA, peripheral neuropathy HISTORY FROM: patient  HISTORY OF PRESENT ILLNESS: Anthony [name] is a 70 year old male with a history of obstructive sleep apnea and peripheral neuropathy. He returns today for an evaluation. His compliance download indicates that he uses machine 30 out of 30 days for compliance of 100%. He uses machine greater than 4 hours 30 out of 30 days for compliance of 100%. On average he uses his machine 8 hours and 17 minutes. His residual AHI is 2.9 on a minimum pressure of 8 cm of water and a maximum pressure of 13 cm of water with EPR of 2. The patient does not have a significant leak. Patient states that the increasing gabapentin was initially beneficial for his discomfort. He states that he only has significant discomfort at bedtime. He states that he has burning and tingling in the lower legs. He describes it as a hot poker feeling on the bottom of his feet. Denies any changes with his gait or balance. He is only taking Gabapentin 600 mg at bedtime. He returns today for an evaluation.  HISTORY 08/23/14: Anthony [name] is a 70 year old male with a history of obstructive sleep apnea and numbness and tingling in the lower extremities. He returns today for follow-up. The patient's CPAP download indicates that he uses machine 30 out of 30 days for compliance 100%. He uses machine greater than 4 hours 29 out of 30 days for compliance of 97%. On average he uses machine 7 hours and 2 minutes. His AHI is 3 with a minimum pressure of 8 cm of water and maximum pressure of 13 cm of water with EPR of 2. The patient does not have a significant leak. Overall patient feels that the CPAP has been beneficial however he states in the last 5-6 weeks he has not been sleeping as well. He states that he wakes up with his legs hurting and has been having night sweats. The patient has an appointment tomorrow with his primary care provider  to talk about the night sweats. His Epworth score today is 16 and fatigue severity score is 39. He uses gabapentin 100 mg in the morning and at noon and 400 mg at bedtime. Initially this worked well for his symptoms. However he states that now he is waking up in the middle the night with his legs "aching.". He denies any new symptoms. Since the last visit the patient was diagnosed with pancreatitis and an enlarged liver. He returns today for an evaluation.  REVIEW OF SYSTEMS: Out of a complete 14 system review of symptoms, the patient complains only of the following symptoms, and all other reviewed systems are negative.  Excessive sweating, eye discharge, 3 loss, trouble swallowing, drooling, leg swelling, restless leg, apnea, snoring, muscle cramps, neck pain, itching, excessive eating  ALLERGIES: Allergies  Allergen Reactions  . Carbidopa-Levodopa Other (See Comments)    Made skin crawl     HOME MEDICATIONS: Outpatient Prescriptions Prior to Visit  Medication Sig Dispense Refill  . allopurinol (ZYLOPRIM) 300 MG tablet Take 300 mg by mouth daily.     . furosemide (LASIX) 40 MG tablet Take 40 mg by mouth daily.    Marland Kitchen gabapentin (NEURONTIN) 300 MG capsule TAKE 2 CAPSULES (600 MG TOTAL) BY MOUTH AT BEDTIME. 60 capsule 1  . Investigational - Study Medication Take 5 tablets by mouth every morning.    Marland Kitchen lisinopril (PRINIVIL,ZESTRIL) 40 MG tablet Take 1 tablet by  mouth daily.     . metFORMIN (GLUCOPHAGE) 1000 MG tablet Take 1 tablet by mouth 2 (two) times daily.     . mometasone (NASONEX) 50 MCG/ACT nasal spray Place 2 sprays into the nose daily. 17 g 12  . pantoprazole (PROTONIX) 40 MG tablet Take 40 mg by mouth daily.     . simvastatin (ZOCOR) 40 MG tablet Take 40 mg by mouth every evening.     Marland Kitchen SYNTHROID 25 MCG tablet Take 25 mcg by mouth daily.     . tamsulosin (FLOMAX) 0.4 MG CAPS capsule Take 0.4 mg by mouth every evening.      No facility-administered medications prior to visit.     PAST MEDICAL HISTORY: Past Medical History  Diagnosis Date  . Diabetes mellitus type 2, controlled ([place])   . HTN (hypertension)   . Hyperlipidemia   . Hypothyroid   . Gout     last flare up last week right ankle   . BPH (benign prostatic hyperplasia)   . 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 Obesity (BMI 30.0-34.9) 05/14/2013  . OSA on CPAP     cpap setting of 10/ 13  . Pneumonia 12 years ago  . GERD (gastroesophageal reflux disease)   . Fatty liver   . Pancreatitis dx march 2016    PAST SURGICAL HISTORY: Past Surgical History  Procedure Laterality Date  . Carpal tunnel release Left 2003  . Shoulder surgery Right 2003  . Carpal tunnel release Bilateral   . Nasal sinus surgery  1981  . Cataract extraction Left 01/2012  . Retinal Left 06/2013    Retinal peel  . Back surgery  10/2009    Cervical, arterior  . Eus N/A 07/15/2014    Procedure: FULL UPPER ENDOSCOPIC ULTRASOUND (EUS) RADIAL;  Surgeon: Carol Ada, MD;  Location: [facility];  Service: Endoscopy;  Laterality: N/A;    FAMILY HISTORY: Family History  Problem Relation Age of Onset  . Colon cancer Father   . Colon cancer Maternal Grandfather   . Heart Problems Maternal Grandfather   . Diabetes Maternal Grandmother   . Lung cancer Brother     SOCIAL HISTORY: Social History   Social History  . Marital Status: Married    Spouse Name: [name]  . Number of Children: 2  . Years of Education: 12   Occupational History  . [employer]    Social History Main Topics  . Smoking status: Never Smoker   . Smokeless tobacco: Never Used  . Alcohol Use: No  . Drug Use: No  . Sexual Activity: Not on file   Other Topics Concern  . Not on file   Social History Narrative   Patient is married [name]) and lives at home with his wife.   Patient has two children (twins).   Patient is retired.   Patient has a high school education.   Patient does not drink any caffeine.   Patient is left-handed.      PHYSICAL  EXAM  Filed Vitals:   02/23/15 0805  BP: 145/80  Pulse: 65  Height: '5\' 10"'$  (1.778 m)  Weight: 240 lb 8 oz (109.09 kg)   Body mass index is 34.51 kg/(m^2).  Generalized: Well developed, in no acute distress  Neck: Circumference 20 inches, Mallampati 4+  Neurological examination  Mentation: Alert oriented to time, place, history taking. Follows all commands speech and language fluent Cranial nerve II-XII: Pupils were equal round reactive to light. Extraocular movements were full, visual field were full on  confrontational test. Facial sensation and strength were normal. Uvula tongue midline. Head turning and shoulder shrug  were normal and symmetric. Motor: The motor testing reveals 5 over 5 strength of all 4 extremities. Good symmetric motor tone is noted throughout.  Sensory: Sensory testing is intact to soft touch on all 4 extremities. No evidence of extinction is noted.  Coordination: Cerebellar testing reveals good finger-nose-finger and heel-to-shin bilaterally.  Gait and station: Gait is normal. Tandem gait is normal. Romberg is negative. No drift is seen.  Reflexes: Deep tendon reflexes are symmetric and normal bilaterally.   DIAGNOSTIC DATA (LABS, IMAGING, TESTING) - I reviewed patient records, labs, notes, testing and imaging myself where available.   ASSESSMENT AND PLAN 70 y.o. year old male  has a past medical history of Diabetes mellitus type 2, controlled ([place]); HTN (hypertension); Hyperlipidemia; Hypothyroid; Gout; BPH (benign prostatic hyperplasia); Nasal septal deformity (05/14/2013); Obesity (BMI 30.0-34.9) (05/14/2013); OSA on CPAP; Pneumonia (12 years ago); GERD (gastroesophageal reflux disease); Fatty liver; and Pancreatitis (dx march 2016). here with:  1. Obstructive sleep apnea on CPAP 2. Peripheral neuropathy  The patient CPAP download is excellent. He will continue using CPAP nightly. The patient will increase his gabapentin to 900 mg at bedtime. I discussed with  the patient about slitting his dose. He could take 1 tablet at dinner time and 2 tablets at bedtime. Patient verbalized understanding. He was advised that if his symptoms worsen or he develops any new symptoms he should let us know. He will follow-up in 6 months or sooner if needed.  Ward Givens, MSN, NP-C 02/23/2015, 8:18 AM [facility] [address] [place], [place] [address] [phone]

## 2015-02-28 DIAGNOSIS — G4733 Obstructive sleep apnea (adult) (pediatric): Secondary | ICD-10-CM | POA: Diagnosis not present

## 2015-02-28 DIAGNOSIS — E119 Type 2 diabetes mellitus without complications: Secondary | ICD-10-CM | POA: Diagnosis not present

## 2015-02-28 DIAGNOSIS — E038 Other specified hypothyroidism: Secondary | ICD-10-CM | POA: Diagnosis not present

## 2015-02-28 DIAGNOSIS — M109 Gout, unspecified: Secondary | ICD-10-CM | POA: Diagnosis not present

## 2015-02-28 DIAGNOSIS — Z1389 Encounter for screening for other disorder: Secondary | ICD-10-CM | POA: Diagnosis not present

## 2015-02-28 DIAGNOSIS — N401 Enlarged prostate with lower urinary tract symptoms: Secondary | ICD-10-CM | POA: Diagnosis not present

## 2015-02-28 DIAGNOSIS — M545 Low back pain: Secondary | ICD-10-CM | POA: Diagnosis not present

## 2015-02-28 DIAGNOSIS — G629 Polyneuropathy, unspecified: Secondary | ICD-10-CM | POA: Diagnosis not present

## 2015-02-28 DIAGNOSIS — Z6833 Body mass index (BMI) 33.0-33.9, adult: Secondary | ICD-10-CM | POA: Diagnosis not present

## 2015-02-28 DIAGNOSIS — G6289 Other specified polyneuropathies: Secondary | ICD-10-CM | POA: Diagnosis not present

## 2015-02-28 DIAGNOSIS — I10 Essential (primary) hypertension: Secondary | ICD-10-CM | POA: Diagnosis not present

## 2015-02-28 DIAGNOSIS — K219 Gastro-esophageal reflux disease without esophagitis: Secondary | ICD-10-CM | POA: Diagnosis not present

## 2015-02-28 DIAGNOSIS — E7800 Pure hypercholesterolemia, unspecified: Secondary | ICD-10-CM | POA: Diagnosis not present

## 2015-03-07 ENCOUNTER — Telehealth: Payer: Self-pay | Admitting: Adult Health

## 2015-03-07 NOTE — Telephone Encounter (Signed)
Patient called to follow up with [facility] on medication gabapentin (NEURONTIN) 300 MG capsule  she increased dosage on at last visit, patient states increased dosage seems to be helping, Rx will need to be sent in for this.

## 2015-03-07 NOTE — Telephone Encounter (Signed)
LMVM for pt, glad that increase working.   Medication done at increased dose to [facility] [address].  Pt to call pharmacy for refill.  Please call if questions.

## 2015-03-13 NOTE — Telephone Encounter (Signed)
Spoke to [place] at [facility] gave the increased dose,   Gabapentin  300mg  caps  (take 3 capsules po qhs ) total 900mg  # 90 with 5 refills.

## 2015-03-13 NOTE — Addendum Note (Signed)
Addended byOliver Hum on: 03/13/2015 12:14 PM   Modules accepted: Medications

## 2015-03-13 NOTE — Telephone Encounter (Signed)
LMVM for pt that I called pharmacy.

## 2015-03-13 NOTE — Telephone Encounter (Signed)
Patient called to advise, [facility] would not refill gabapentin (NEURONTIN) 300 MG capsule due to not receiving an updated Rx for dosage increase.

## 2015-04-18 DIAGNOSIS — M25531 Pain in right wrist: Secondary | ICD-10-CM | POA: Diagnosis not present

## 2015-04-18 DIAGNOSIS — Z6832 Body mass index (BMI) 32.0-32.9, adult: Secondary | ICD-10-CM | POA: Diagnosis not present

## 2015-04-18 DIAGNOSIS — M79641 Pain in right hand: Secondary | ICD-10-CM | POA: Diagnosis not present

## 2015-04-18 DIAGNOSIS — Y9355 Activity, bike riding: Secondary | ICD-10-CM | POA: Diagnosis not present

## 2015-06-13 DIAGNOSIS — B078 Other viral warts: Secondary | ICD-10-CM | POA: Diagnosis not present

## 2015-06-13 DIAGNOSIS — D485 Neoplasm of uncertain behavior of skin: Secondary | ICD-10-CM | POA: Diagnosis not present

## 2015-06-13 DIAGNOSIS — L080 Pyoderma: Secondary | ICD-10-CM | POA: Diagnosis not present

## 2015-06-28 DIAGNOSIS — G4733 Obstructive sleep apnea (adult) (pediatric): Secondary | ICD-10-CM | POA: Diagnosis not present

## 2015-07-05 DIAGNOSIS — E119 Type 2 diabetes mellitus without complications: Secondary | ICD-10-CM | POA: Diagnosis not present

## 2015-07-05 DIAGNOSIS — K219 Gastro-esophageal reflux disease without esophagitis: Secondary | ICD-10-CM | POA: Diagnosis not present

## 2015-07-05 DIAGNOSIS — Z1211 Encounter for screening for malignant neoplasm of colon: Secondary | ICD-10-CM | POA: Diagnosis not present

## 2015-07-06 DIAGNOSIS — G4733 Obstructive sleep apnea (adult) (pediatric): Secondary | ICD-10-CM | POA: Diagnosis not present

## 2015-07-20 DIAGNOSIS — Z1211 Encounter for screening for malignant neoplasm of colon: Secondary | ICD-10-CM | POA: Diagnosis not present

## 2015-07-24 DIAGNOSIS — L738 Other specified follicular disorders: Secondary | ICD-10-CM | POA: Diagnosis not present

## 2015-08-21 DIAGNOSIS — M109 Gout, unspecified: Secondary | ICD-10-CM | POA: Diagnosis not present

## 2015-08-21 DIAGNOSIS — I10 Essential (primary) hypertension: Secondary | ICD-10-CM | POA: Diagnosis not present

## 2015-08-21 DIAGNOSIS — Z125 Encounter for screening for malignant neoplasm of prostate: Secondary | ICD-10-CM | POA: Diagnosis not present

## 2015-08-21 DIAGNOSIS — E119 Type 2 diabetes mellitus without complications: Secondary | ICD-10-CM | POA: Diagnosis not present

## 2015-08-21 DIAGNOSIS — E038 Other specified hypothyroidism: Secondary | ICD-10-CM | POA: Diagnosis not present

## 2015-08-23 ENCOUNTER — Encounter: Payer: Self-pay | Admitting: Neurology

## 2015-08-23 ENCOUNTER — Ambulatory Visit (INDEPENDENT_AMBULATORY_CARE_PROVIDER_SITE_OTHER): Payer: PPO | Admitting: Neurology

## 2015-08-23 VITALS — BP 120/78 | HR 82 | Resp 20 | Ht 70.5 in | Wt 225.0 lb

## 2015-08-23 DIAGNOSIS — Z9989 Dependence on other enabling machines and devices: Principal | ICD-10-CM

## 2015-08-23 DIAGNOSIS — G4733 Obstructive sleep apnea (adult) (pediatric): Secondary | ICD-10-CM | POA: Diagnosis not present

## 2015-08-23 NOTE — Progress Notes (Signed)
Pt came to office visit today and advised me that he is unhappy with [facility] because they keep messing up his billing. He is asking to switch DMEs to [facility]. Demographics, insurance, sleep study, OV notes with Dr. Brett Fairy, sent to [place]. Pt verbalized understanding.

## 2015-08-23 NOTE — Patient Instructions (Signed)
Neuropathic Pain Neuropathic pain is pain caused by damage to the nerves that are responsible for certain sensations in your body (sensory nerves). The pain can be caused by damage to:   The sensory nerves that send signals to your spinal cord and brain (peripheral nervous system).  The sensory nerves in your brain or spinal cord (central nervous system). Neuropathic pain can make you more sensitive to pain. What would be a minor sensation for most people may feel very painful if you have neuropathic pain. This is usually a long-term condition that can be difficult to treat. The type of pain can differ from person to person. It may start suddenly (acute), or it may develop slowly and last for a long time (chronic). Neuropathic pain may come and go as damaged nerves heal or may stay at the same level for years. It often causes emotional distress, loss of sleep, and a lower quality of life. CAUSES  The most common cause of damage to a sensory nerve is diabetes. Many other diseases and conditions can also cause neuropathic pain. Causes of neuropathic pain can be classified as:  Toxic. Many drugs and chemicals can cause toxic damage. The most common cause of toxic neuropathic pain is damage from drug treatment for cancer (chemotherapy).  Metabolic. This type of pain can happen when a disease causes imbalances that damage nerves. Diabetes is the most common of these diseases. Vitamin B deficiency caused by long-term alcohol abuse is another common cause.  Traumatic. Any injury that cuts, crushes, or stretches a nerve can cause damage and pain. A common example is feeling pain after losing an arm or leg (phantom limb pain).  Compression-related. If a sensory nerve gets trapped or compressed for a long period of time, the blood supply to the nerve can be cut off.  Vascular. Many blood vessel diseases can cause neuropathic pain by decreasing blood supply and oxygen to nerves.  Autoimmune. This type of  pain results from diseases in which the body's defense system mistakenly attacks sensory nerves. Examples of autoimmune diseases that can cause neuropathic pain include lupus and multiple sclerosis.  Infectious. Many types of viral infections can damage sensory nerves and cause pain. Shingles infection is a common cause of this type of pain.  Inherited. Neuropathic pain can be a symptom of many diseases that are passed down through families (genetic). SIGNS AND SYMPTOMS  The main symptom is pain. Neuropathic pain is often described as:  Burning.  Shock-like.  Stinging.  Hot or cold.  Itching. DIAGNOSIS  No single test can diagnose neuropathic pain. Your health care provider will do a physical exam and ask you about your pain. You may use a pain scale to describe how bad your pain is. You may also have tests to see if you have a high sensitivity to pain and to help find the cause and location of any sensory nerve damage. These tests may include:  Imaging studies, such as:  X-rays.  CT scan.  MRI.  Nerve conduction studies to test how well nerve signals travel through your sensory nerves (electrodiagnostic testing).  Stimulating your sensory nerves through electrodes on your skin and measuring the response in your spinal cord and brain (somatosensory evoked potentials). TREATMENT  Treatment for neuropathic pain may change over time. You may need to try different treatment options or a combination of treatments. Some options include:  Over-the-counter pain relievers.  Prescription medicines. Some medicines used to treat other conditions may also help neuropathic pain. These   include medicines to:  Control seizures (anticonvulsants).  Relieve depression (antidepressants).  Prescription-strength pain relievers (narcotics). These are usually used when other pain relievers do not help.  Transcutaneous nerve stimulation (TENS). This uses electrical currents to block painful nerve  signals. The treatment is painless.  Topical and local anesthetics. These are medicines that numb the nerves. They can be injected as a nerve block or applied to the skin.  Alternative treatments, such as:  Acupuncture.  Meditation.  Massage.  Physical therapy.  Pain management programs.  Counseling. HOME CARE INSTRUCTIONS  Learn as much as you can about your condition.  Take medicines only as directed by your health care provider.  Work closely with all your health care providers to find what works best for you.  Have a good support system at home.  Consider joining a chronic pain support group. SEEK MEDICAL CARE IF:  Your pain treatments are not helping.  You are having side effects from your medicines.  You are struggling with fatigue, mood changes, depression, or anxiety.   This information is not intended to replace advice given to you by your health care provider. Make sure you discuss any questions you have with your health care provider.   Document Released: 10/05/2003 Document Revised: 01/28/2014 Document Reviewed: 06/17/2013 Elsevier Interactive Patient Education 2016 Elsevier Inc.  

## 2015-08-23 NOTE — Progress Notes (Signed)
PATIENT: Anthony [name] DOB: [birth year]  REASON FOR VISIT: follow up- OSA, peripheral neuropathy HISTORY FROM: patient  HISTORY OF PRESENT ILLNESS: Anthony [name] is a 70 year old male with a history of obstructive sleep apnea , RLS and peripheral neuropathy.  He returns today for an evaluation. His compliance download indicates that he uses machine 30 out of 30 days for compliance of 100%. He uses machine greater than 4 hours 30 out of 30 days for compliance of 100%. On average he uses his machine 8 hours and 17 minutes. His residual AHI is 2.9 on a minimum pressure of 8 cm of water and a maximum pressure of 13 cm of water with EPR of 2. The patient does not have a significant leak. Patient states that the increasing gabapentin was initially beneficial for his discomfort. He states that he only has significant discomfort at bedtime. He states that he has burning and tingling in the lower legs. He describes it as a hot poker feeling on the bottom of his feet. Denies any changes with his gait or balance. He is only taking Gabapentin 600 mg at bedtime. He returns today for an evaluation.  08-23-2014 Anthony [name] is using CPAP at a 96 pressure percentile of 12 cm water in form of an AutoSet between 8 and 13 cm water with 2 cm water expiratory pressure relief. Residual AHI is 1.4 with 100% compliance average user time is 7 hours 42 minutes. He had trouble with [facility] billing [facility] and has changed to our [facility]. I would encourage him to try a new mask if he feels that it could be more comfortable, he has seen the dream wear mask in our office. Epworth sleepiness score is endorsed at 12 points, fatigue severity 34 points, geriatric depression score 10 out of 15 points. We will address this today as well. Since the patient still has burning feet at night but no longer restless legs on Neurontin 900 mg nightly I suggested a lidocaine or ropivacaine cream. As for his clinical depression; his  retirement is not what he hoped for. He supports still his 64 year old daughter and her 55 year old sons.    HISTORY 08/23/14: Anthony [name] is a 70 year old male with a history of obstructive sleep apnea and numbness and tingling in the lower extremities. He returns today for follow-up. The patient's CPAP download indicates that he uses machine 30 out of 30 days for compliance 100%. He uses machine greater than 4 hours 29 out of 30 days for compliance of 97%. On average he uses machine 7 hours and 2 minutes. His AHI is 3 with a minimum pressure of 8 cm of water and maximum pressure of 13 cm of water with EPR of 2. The patient does not have a significant leak. Overall patient feels that the CPAP has been beneficial however he states in the last 5-6 weeks he has not been sleeping as well. He states that he wakes up with his legs hurting and has been having night sweats. The patient has an appointment tomorrow with his primary care provider to talk about the night sweats. His Epworth score today is 16 and fatigue severity score is 39. He uses gabapentin 100 mg in the morning and at noon and 400 mg at bedtime. Initially this worked well for his symptoms. However he states that now he is waking up in the middle the night with his legs "aching.". He denies any new symptoms. Since the last visit  the patient was diagnosed with pancreatitis and an enlarged liver. He returns today for an evaluation.  REVIEW OF SYSTEMS: Out of a complete 14 system review of symptoms, the patient complains only of the following symptoms, and all other reviewed systems are negative.  Excessive sweating, eye discharge, 3 loss, trouble swallowing, drooling, leg swelling, restless leg, apnea, snoring, muscle cramps, neck pain, itching, excessive eating  ALLERGIES: Allergies  Allergen Reactions  . Carbidopa-Levodopa Other (See Comments)    Made skin crawl     HOME MEDICATIONS: Outpatient Medications Prior to Visit  Medication Sig  Dispense Refill  . allopurinol (ZYLOPRIM) 300 MG tablet Take 300 mg by mouth daily.     . furosemide (LASIX) 40 MG tablet Take 40 mg by mouth daily.    Marland Kitchen gabapentin (NEURONTIN) 300 MG capsule Take 3 capsules (900 mg total) by mouth at bedtime. 90 capsule 5  . Investigational - Study Medication Take 5 tablets by mouth every morning.    Marland Kitchen JANUVIA 100 MG tablet Take 100 mg by mouth daily.  3  . lisinopril (PRINIVIL,ZESTRIL) 40 MG tablet Take 1 tablet by mouth daily.     . metFORMIN (GLUCOPHAGE) 1000 MG tablet Take 1 tablet by mouth 2 (two) times daily.     . mometasone (NASONEX) 50 MCG/ACT nasal spray Place 2 sprays into the nose daily. 17 g 12  . pantoprazole (PROTONIX) 40 MG tablet Take 40 mg by mouth daily.     . simvastatin (ZOCOR) 40 MG tablet Take 40 mg by mouth every evening.     Marland Kitchen SYNTHROID 25 MCG tablet Take 25 mcg by mouth daily.     . tamsulosin (FLOMAX) 0.4 MG CAPS capsule Take 0.4 mg by mouth every evening.      No facility-administered medications prior to visit.     PAST MEDICAL HISTORY: Past Medical History:  Diagnosis Date  . BPH (benign prostatic hyperplasia)   . Diabetes mellitus type 2, controlled ([place])   . Fatty liver   . GERD (gastroesophageal reflux disease)   . Gout    last flare up last week right ankle   . HTN (hypertension)   . Hyperlipidemia   . Hypothyroid   . 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 Obesity (BMI 30.0-34.9) 05/14/2013  . OSA on CPAP    cpap setting of 10/ 13  . Pancreatitis dx march 2016  . Pneumonia 12 years ago    PAST SURGICAL HISTORY: Past Surgical History:  Procedure Laterality Date  . BACK SURGERY  10/2009   Cervical, arterior  . CARPAL TUNNEL RELEASE Left 2003  . CARPAL TUNNEL RELEASE Bilateral   . CATARACT EXTRACTION Left 01/2012  . EUS N/A 07/15/2014   Procedure: FULL UPPER ENDOSCOPIC ULTRASOUND (EUS) RADIAL;  Surgeon: Carol Ada, MD;  Location: [facility];  Service: Endoscopy;  Laterality: N/A;  . NASAL SINUS SURGERY   1981  . RETINAL Left 06/2013   Retinal peel  . SHOULDER SURGERY Right 2003    FAMILY HISTORY: Family History  Problem Relation Age of Onset  . Colon cancer Father   . Colon cancer Maternal Grandfather   . Heart Problems Maternal Grandfather   . Diabetes Maternal Grandmother   . Lung cancer Brother     SOCIAL HISTORY: Social History   Social History  . Marital status: Married    Spouse name: [name]  . Number of children: 2  . Years of education: 12   Occupational History  . [employer]  History Main Topics  . Smoking status: Never Smoker  . Smokeless tobacco: Never Used  . Alcohol use No  . Drug use: No  . Sexual activity: Not on file   Other Topics Concern  . Not on file   Social History Narrative   Patient is married [name]) and lives at home with his wife.   Patient has two children (twins).   Patient is retired.   Patient has a high school education.   Patient does not drink any caffeine.   Patient is left-handed.      PHYSICAL EXAM  Vitals:   08/23/15 0920  BP: 120/78  Pulse: 82  Resp: 20  Weight: 225 lb (102.1 kg)  Height: 5' 10.5" (1.791 m)   Body mass index is 31.83 kg/m.  Generalized: Well developed, in no acute distress  Neck: Circumference 20 inches, Mallampati 4+  Neurological examination  Mentation: Alert oriented to time, place, history taking. Follows all commands speech and language fluent Cranial nerve II-XII: Pupils were equal round reactive to light. Extraocular movements were full, visual field were full on confrontational test. Facial sensation and strength were normal. Uvula tongue midline. Head turning and shoulder shrug  were normal and symmetric. Motor: The motor testing reveals 5 over 5 strength of all 4 extremities. Good symmetric motor tone is noted throughout.  Sensory: Sensory testing is intact to soft touch on all 4 extremities. No evidence of extinction is noted.  Coordination: Cerebellar  testing reveals good finger-nose-finger and heel-to-shin bilaterally.  Gait and station: Gait is normal. Tandem gait is normal. Romberg is negative. No drift is seen.  Reflexes: Deep tendon reflexes are symmetric and normal bilaterally.   DIAGNOSTIC DATA (LABS, IMAGING, TESTING) - I reviewed patient records, labs, notes, testing and imaging myself where available.   ASSESSMENT AND PLAN 70 y.o. year old male  has a past medical history of BPH (benign prostatic hyperplasia); Diabetes mellitus type 2, controlled ([place]); Fatty liver; GERD (gastroesophageal reflux disease); Gout; HTN (hypertension); Hyperlipidemia; Hypothyroid; Nasal septal deformity (05/14/2013); Obesity (BMI 30.0-34.9) (05/14/2013); OSA on CPAP; Pancreatitis (dx march 2016); and Pneumonia (12 years ago). here with:  1. Obstructive sleep apnea on CPAP, Anthony [name] is using CPAP at a 96 pressure percentile of 12 cm water in form of an AutoSet between 8 and 13 cm water with 2 cm water expiratory pressure relief. Residual AHI is 1.4 with 100% compliance average user time is 7 hours 42 minutes. 2. Peripheral neuropathy , agent orange related on Neurontin 900 mg - still keeps hm from sleeping , feet  feel on fire. 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  The restless legs are improved, the pain in the feet not so much. I will try to get a mixture of local anesthetic takes as an appointment for this patient and see if this will help his foot pain at night. I discouraged him from using more than once at night as it can be for feet so numb that it can affect gait stability. 3. Restless legs- inmpoved on neurontin 4. Nocturia- 2-4 times at night ! Can related to DM and BPH, not longer to OSA.   The patient CPAP download is excellent.    The patient will continue his  oral gabapentin 900 mg at bedtime. I discussed with the patient about slitting his dose. He could take 1 tablet at dinner time and 2 tablets at bedtime.He will follow-up in 12 months or  sooner if needed.  Aryella Besecker ,  08/23/2015, 9:49  AM [facility] [address] [place], [place] [address] [phone]

## 2015-08-29 DIAGNOSIS — Z0000 Encounter for general adult medical examination without abnormal findings: Secondary | ICD-10-CM | POA: Diagnosis not present

## 2015-08-29 DIAGNOSIS — G629 Polyneuropathy, unspecified: Secondary | ICD-10-CM | POA: Diagnosis not present

## 2015-08-29 DIAGNOSIS — C4492 Squamous cell carcinoma of skin, unspecified: Secondary | ICD-10-CM | POA: Diagnosis not present

## 2015-08-29 DIAGNOSIS — E669 Obesity, unspecified: Secondary | ICD-10-CM | POA: Diagnosis not present

## 2015-08-29 DIAGNOSIS — Z6833 Body mass index (BMI) 33.0-33.9, adult: Secondary | ICD-10-CM | POA: Diagnosis not present

## 2015-08-29 DIAGNOSIS — G2581 Restless legs syndrome: Secondary | ICD-10-CM | POA: Diagnosis not present

## 2015-08-29 DIAGNOSIS — Z1212 Encounter for screening for malignant neoplasm of rectum: Secondary | ICD-10-CM | POA: Diagnosis not present

## 2015-08-29 DIAGNOSIS — N401 Enlarged prostate with lower urinary tract symptoms: Secondary | ICD-10-CM | POA: Diagnosis not present

## 2015-08-29 DIAGNOSIS — E119 Type 2 diabetes mellitus without complications: Secondary | ICD-10-CM | POA: Diagnosis not present

## 2015-08-29 DIAGNOSIS — Z1389 Encounter for screening for other disorder: Secondary | ICD-10-CM | POA: Diagnosis not present

## 2015-08-29 DIAGNOSIS — D692 Other nonthrombocytopenic purpura: Secondary | ICD-10-CM | POA: Diagnosis not present

## 2015-08-29 DIAGNOSIS — M65819 Other synovitis and tenosynovitis, unspecified shoulder: Secondary | ICD-10-CM | POA: Diagnosis not present

## 2015-09-11 ENCOUNTER — Other Ambulatory Visit: Payer: Self-pay | Admitting: Adult Health

## 2015-09-28 DIAGNOSIS — G4733 Obstructive sleep apnea (adult) (pediatric): Secondary | ICD-10-CM | POA: Diagnosis not present

## 2015-11-14 DIAGNOSIS — E119 Type 2 diabetes mellitus without complications: Secondary | ICD-10-CM | POA: Diagnosis not present

## 2015-11-14 DIAGNOSIS — I10 Essential (primary) hypertension: Secondary | ICD-10-CM | POA: Diagnosis not present

## 2015-11-14 DIAGNOSIS — Z6834 Body mass index (BMI) 34.0-34.9, adult: Secondary | ICD-10-CM | POA: Diagnosis not present

## 2016-01-01 ENCOUNTER — Telehealth: Payer: Self-pay

## 2016-01-01 MED ORDER — GABAPENTIN 300 MG PO CAPS: 900.0000 mg | ORAL_CAPSULE | Freq: Every day | ORAL | 0 refills | Status: DC

## 2016-01-01 NOTE — Telephone Encounter (Signed)
90 day request

## 2016-01-29 DIAGNOSIS — G4733 Obstructive sleep apnea (adult) (pediatric): Secondary | ICD-10-CM | POA: Diagnosis not present

## 2016-02-27 DIAGNOSIS — E038 Other specified hypothyroidism: Secondary | ICD-10-CM | POA: Diagnosis not present

## 2016-02-27 DIAGNOSIS — E119 Type 2 diabetes mellitus without complications: Secondary | ICD-10-CM | POA: Diagnosis not present

## 2016-02-27 DIAGNOSIS — Z6836 Body mass index (BMI) 36.0-36.9, adult: Secondary | ICD-10-CM | POA: Diagnosis not present

## 2016-02-27 DIAGNOSIS — E7800 Pure hypercholesterolemia, unspecified: Secondary | ICD-10-CM | POA: Diagnosis not present

## 2016-02-27 DIAGNOSIS — I10 Essential (primary) hypertension: Secondary | ICD-10-CM | POA: Diagnosis not present

## 2016-03-26 DIAGNOSIS — E119 Type 2 diabetes mellitus without complications: Secondary | ICD-10-CM | POA: Diagnosis not present

## 2016-03-26 DIAGNOSIS — Z6835 Body mass index (BMI) 35.0-35.9, adult: Secondary | ICD-10-CM | POA: Diagnosis not present

## 2016-05-10 ENCOUNTER — Other Ambulatory Visit: Payer: Self-pay | Admitting: Neurology

## 2016-05-14 NOTE — Telephone Encounter (Signed)
Refill request completed.

## 2016-07-15 DIAGNOSIS — G4733 Obstructive sleep apnea (adult) (pediatric): Secondary | ICD-10-CM | POA: Diagnosis not present

## 2016-07-16 DIAGNOSIS — L738 Other specified follicular disorders: Secondary | ICD-10-CM | POA: Diagnosis not present

## 2016-07-16 DIAGNOSIS — L718 Other rosacea: Secondary | ICD-10-CM | POA: Diagnosis not present

## 2016-07-30 DIAGNOSIS — L738 Other specified follicular disorders: Secondary | ICD-10-CM | POA: Diagnosis not present

## 2016-07-30 DIAGNOSIS — L718 Other rosacea: Secondary | ICD-10-CM | POA: Diagnosis not present

## 2016-08-09 DIAGNOSIS — E162 Hypoglycemia, unspecified: Secondary | ICD-10-CM | POA: Diagnosis not present

## 2016-08-09 DIAGNOSIS — Z6835 Body mass index (BMI) 35.0-35.9, adult: Secondary | ICD-10-CM | POA: Diagnosis not present

## 2016-08-09 DIAGNOSIS — E119 Type 2 diabetes mellitus without complications: Secondary | ICD-10-CM | POA: Diagnosis not present

## 2016-08-22 ENCOUNTER — Ambulatory Visit: Payer: PPO | Admitting: Adult Health

## 2016-08-27 ENCOUNTER — Encounter (INDEPENDENT_AMBULATORY_CARE_PROVIDER_SITE_OTHER): Payer: Self-pay

## 2016-08-27 ENCOUNTER — Encounter: Payer: Self-pay | Admitting: Adult Health

## 2016-08-27 ENCOUNTER — Ambulatory Visit (INDEPENDENT_AMBULATORY_CARE_PROVIDER_SITE_OTHER): Payer: PPO | Admitting: Adult Health

## 2016-08-27 VITALS — BP 115/72 | HR 58 | Ht 70.5 in | Wt 242.4 lb

## 2016-08-27 DIAGNOSIS — Z9989 Dependence on other enabling machines and devices: Secondary | ICD-10-CM

## 2016-08-27 DIAGNOSIS — E7800 Pure hypercholesterolemia, unspecified: Secondary | ICD-10-CM | POA: Diagnosis not present

## 2016-08-27 DIAGNOSIS — G4733 Obstructive sleep apnea (adult) (pediatric): Secondary | ICD-10-CM | POA: Diagnosis not present

## 2016-08-27 DIAGNOSIS — M109 Gout, unspecified: Secondary | ICD-10-CM | POA: Diagnosis not present

## 2016-08-27 DIAGNOSIS — I10 Essential (primary) hypertension: Secondary | ICD-10-CM | POA: Diagnosis not present

## 2016-08-27 DIAGNOSIS — G629 Polyneuropathy, unspecified: Secondary | ICD-10-CM | POA: Diagnosis not present

## 2016-08-27 DIAGNOSIS — E119 Type 2 diabetes mellitus without complications: Secondary | ICD-10-CM | POA: Diagnosis not present

## 2016-08-27 DIAGNOSIS — E038 Other specified hypothyroidism: Secondary | ICD-10-CM | POA: Diagnosis not present

## 2016-08-27 DIAGNOSIS — Z125 Encounter for screening for malignant neoplasm of prostate: Secondary | ICD-10-CM | POA: Diagnosis not present

## 2016-08-27 NOTE — Progress Notes (Signed)
I agree with the assessment and plan as directed by NP .The patient is known to me .   Dhilan Brauer, MD  

## 2016-08-27 NOTE — Progress Notes (Signed)
PATIENT: Anthony [name] DOB: 1945/06/20  REASON FOR VISIT: follow up- OSA on CPAP, peripheral neuropathy HISTORY FROM: patient  HISTORY OF PRESENT ILLNESS: Today 08/27/16 Anthony [name] is a 71 year old male with a history of obstructive sleep apnea and peripheral neuropathy. He returns today for follow-up. His CPAP download indicates that he uses machine 30 out of 30 days for compliance of 90%. He uses machine greater than 4 hours each night. On average he uses his machine 7 hours and 52 minutes. His minimum pressure of 8 cm water and maximum pressure of  13 cm of water with EPR 2. His residual AHI is 4 he does not have a significant leak. He feels that the machine is working well for him. He is currently using the nasal pillows. He states that his neuropathy is controlled with gabapentin 900 mg 3 times a day. He reports that he has noted that when walking on the treadmill history become more numb the longer he walks. He returns today for an evaluation.  HISTORY 02/23/15: Anthony [name] is a 71 year old male with a history of obstructive sleep apnea and peripheral neuropathy. He returns today for an evaluation. His compliance download indicates that he uses machine 30 out of 30 days for compliance of 100%. He uses machine greater than 4 hours 30 out of 30 days for compliance of 100%. On average he uses his machine 8 hours and 17 minutes. His residual AHI is 2.9 on a minimum pressure of 8 cm of water and a maximum pressure of 13 cm of water with EPR of 2. The patient does not have a significant leak. Patient states that the increasing gabapentin was initially beneficial for his discomfort. He states that he only has significant discomfort at bedtime. He states that he has burning and tingling in the lower legs. He describes it as a hot poker feeling on the bottom of his feet. Denies any changes with his gait or balance. He is only taking Gabapentin 600 mg at bedtime. He returns today for an  evaluation.  REVIEW OF SYSTEMS: Out of a complete 14 system review of symptoms, the patient complains only of the following symptoms, and all other reviewed systems are negative.  Excessive sweating, hearing loss eating, restless leg, daytime sleepiness and itching, nervous/anxious, dizziness  ALLERGIES: Allergies  Allergen Reactions  . Carbidopa-Levodopa Other (See Comments)    Made skin crawl     HOME MEDICATIONS: Outpatient Medications Prior to Visit  Medication Sig Dispense Refill  . allopurinol (ZYLOPRIM) 300 MG tablet Take 300 mg by mouth daily.     . furosemide (LASIX) 40 MG tablet Take 40 mg by mouth daily.    Marland Kitchen gabapentin (NEURONTIN) 300 MG capsule TAKE 3 CAPSULES AT BEDTIME 270 capsule 1  . Investigational - Study Medication Take 5 tablets by mouth every morning.    Marland Kitchen JANUVIA 100 MG tablet Take 100 mg by mouth daily.  3  . JARDIANCE 25 MG TABS tablet Take 25 mg by mouth daily.    Marland Kitchen lisinopril (PRINIVIL,ZESTRIL) 40 MG tablet Take 1 tablet by mouth daily.     . mometasone (NASONEX) 50 MCG/ACT nasal spray Place 2 sprays into the nose daily. 17 g 12  . pantoprazole (PROTONIX) 40 MG tablet Take 40 mg by mouth daily.     . simvastatin (ZOCOR) 40 MG tablet Take 40 mg by mouth every evening.     Marland Kitchen SYNTHROID 25 MCG tablet Take 25 mcg by mouth daily.     Marland Kitchen  tamsulosin (FLOMAX) 0.4 MG CAPS capsule Take 0.4 mg by mouth every evening.     . metFORMIN (GLUCOPHAGE) 1000 MG tablet Take 1 tablet by mouth 2 (two) times daily.      No facility-administered medications prior to visit.     PAST MEDICAL HISTORY: Past Medical History:  Diagnosis Date  . BPH (benign prostatic hyperplasia)   . Diabetes mellitus type 2, controlled ([place])   . Fatty liver   . GERD (gastroesophageal reflux disease)   . Gout    last flare up last week right ankle   . HTN (hypertension)   . Hyperlipidemia   . Hypothyroid   . 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 Obesity (BMI 30.0-34.9) 05/14/2013  . OSA on CPAP     cpap setting of 10/ 13  . Pancreatitis dx march 2016  . Pneumonia 12 years ago    PAST SURGICAL HISTORY: Past Surgical History:  Procedure Laterality Date  . BACK SURGERY  10/2009   Cervical, arterior  . CARPAL TUNNEL RELEASE Left 2003  . CARPAL TUNNEL RELEASE Bilateral   . CATARACT EXTRACTION Left 01/2012  . EUS N/A 07/15/2014   Procedure: FULL UPPER ENDOSCOPIC ULTRASOUND (EUS) RADIAL;  Surgeon: Carol Ada, MD;  Location: [facility];  Service: Endoscopy;  Laterality: N/A;  . NASAL SINUS SURGERY  1981  . RETINAL Left 06/2013   Retinal peel  . SHOULDER SURGERY Right 2003    FAMILY HISTORY: Family History  Problem Relation Age of Onset  . Colon cancer Father   . Lung cancer Brother   . Colon cancer Maternal Grandfather   . Heart Problems Maternal Grandfather   . Diabetes Maternal Grandmother     SOCIAL HISTORY: Social History   Social History  . Marital status: Married    Spouse name: [name]  . Number of children: 2  . Years of education: 12   Occupational History  . [employer]   Social History Main Topics  . Smoking status: Never Smoker  . Smokeless tobacco: Never Used  . Alcohol use No  . Drug use: No  . Sexual activity: Not on file   Other Topics Concern  . Not on file   Social History Narrative   Patient is married [name]) and lives at home with his wife.   Patient has two children (twins).   Patient is retired.   Patient has a high school education.   Patient does not drink any caffeine.   Patient is left-handed.      PHYSICAL EXAM  Vitals:   08/27/16 0831  BP: 115/72  Pulse: (!) 58  Weight: 242 lb 6.4 oz (110 kg)  Height: 5' 10.5" (1.791 m)   Body mass index is 34.29 kg/m.  Generalized: Well developed, in no acute distress   Neurological examination  Mentation: Alert oriented to time, place, history taking. Follows all commands speech and language fluent Cranial nerve II-XII: Pupils were equal round reactive to  light. Extraocular movements were full, visual field were full on confrontational test. Facial sensation and strength were normal. Uvula tongue midline. Head turning and shoulder shrug  were normal and symmetric.neck circumference 20.5 inches Motor: The motor testing reveals 5 over 5 strength of all 4 extremities. Good symmetric motor tone is noted throughout.  Sensory: Sensory testing is intact to soft touch on all 4 extremities. No evidence of extinction is noted.  Coordination: Cerebellar testing reveals good finger-nose-finger and heel-to-shin bilaterally.  Gait and station: Gait is normal. Tandem gait is  normal. Romberg is negative. No drift is seen.  Reflexes: Deep tendon reflexes are symmetric and normal bilaterally.   DIAGNOSTIC DATA (LABS, IMAGING, TESTING) - I reviewed patient records, labs, notes, testing and imaging myself where available.  Lab Results  Component Value Date   WBC 9.5 11/10/2009   HGB 15.6 03/17/2010   HCT 46.0 03/17/2010   MCV 86.1 11/10/2009   PLT 302 11/10/2009      Component Value Date/Time   NA 138 03/17/2010 1145   K 3.6 03/17/2010 1145   CL 103 03/17/2010 1145   CO2 26 11/10/2009 1554   GLUCOSE 103 (H) 03/17/2010 1145   BUN 13 03/17/2010 1145   CREATININE 0.9 03/17/2010 1145   CALCIUM 9.8 11/10/2009 1554   PROT 6.9 11/10/2009 1554   ALBUMIN 4.3 11/10/2009 1554   AST 27 11/10/2009 1554   ALT 31 11/10/2009 1554   ALKPHOS 66 11/10/2009 1554   BILITOT 0.4 11/10/2009 1554   GFRNONAA >60 11/10/2009 1554   GFRAA  11/10/2009 1554    >60        The eGFR has been calculated using the MDRD equation. This calculation has not been validated in all clinical situations. eGFR's persistently <60 mL/min signify possible Chronic Kidney Disease.      ASSESSMENT AND PLAN 71 y.o. year old male  has a past medical history of BPH (benign prostatic hyperplasia); Diabetes mellitus type 2, controlled ([place]); Fatty liver; GERD (gastroesophageal reflux disease);  Gout; HTN (hypertension); Hyperlipidemia; Hypothyroid; Nasal septal deformity (05/14/2013); Obesity (BMI 30.0-34.9) (05/14/2013); OSA on CPAP; Pancreatitis (dx march 2016); and Pneumonia (12 years ago). here with:  1. Obstructive sleep apnea on CPAP  The patient CPAP download. He is encouraged to continue using CPAP nightly. He will remain on gabapentin 3 times a day. He is advised that if his symptoms worsen or he develops new symptoms he should let us know. He will follow-up in 6 months or sooner if needed.   Ward Givens, MSN, NP-C 08/27/2016, 8:57 AM [facility] [address], [place],  [address] [phone]

## 2016-08-27 NOTE — Patient Instructions (Signed)
Continue using CPAP nightly  Continue gabaentin 900 mg three times a day If your symptoms worsen or you develop new symptoms please let us know.

## 2016-09-03 DIAGNOSIS — D692 Other nonthrombocytopenic purpura: Secondary | ICD-10-CM | POA: Diagnosis not present

## 2016-09-03 DIAGNOSIS — G6289 Other specified polyneuropathies: Secondary | ICD-10-CM | POA: Diagnosis not present

## 2016-09-03 DIAGNOSIS — E7800 Pure hypercholesterolemia, unspecified: Secondary | ICD-10-CM | POA: Diagnosis not present

## 2016-09-03 DIAGNOSIS — Z0000 Encounter for general adult medical examination without abnormal findings: Secondary | ICD-10-CM | POA: Diagnosis not present

## 2016-09-03 DIAGNOSIS — Z1389 Encounter for screening for other disorder: Secondary | ICD-10-CM | POA: Diagnosis not present

## 2016-09-03 DIAGNOSIS — E1140 Type 2 diabetes mellitus with diabetic neuropathy, unspecified: Secondary | ICD-10-CM | POA: Diagnosis not present

## 2016-09-03 DIAGNOSIS — G4733 Obstructive sleep apnea (adult) (pediatric): Secondary | ICD-10-CM | POA: Diagnosis not present

## 2016-09-03 DIAGNOSIS — M545 Low back pain: Secondary | ICD-10-CM | POA: Diagnosis not present

## 2016-09-03 DIAGNOSIS — E162 Hypoglycemia, unspecified: Secondary | ICD-10-CM | POA: Diagnosis not present

## 2016-09-03 DIAGNOSIS — G2581 Restless legs syndrome: Secondary | ICD-10-CM | POA: Diagnosis not present

## 2016-09-03 DIAGNOSIS — Z6835 Body mass index (BMI) 35.0-35.9, adult: Secondary | ICD-10-CM | POA: Diagnosis not present

## 2016-09-06 DIAGNOSIS — Z1212 Encounter for screening for malignant neoplasm of rectum: Secondary | ICD-10-CM | POA: Diagnosis not present

## 2016-09-30 DIAGNOSIS — G5622 Lesion of ulnar nerve, left upper limb: Secondary | ICD-10-CM | POA: Diagnosis not present

## 2016-09-30 DIAGNOSIS — M25522 Pain in left elbow: Secondary | ICD-10-CM | POA: Diagnosis not present

## 2016-09-30 DIAGNOSIS — R200 Anesthesia of skin: Secondary | ICD-10-CM | POA: Diagnosis not present

## 2016-10-07 DIAGNOSIS — G5622 Lesion of ulnar nerve, left upper limb: Secondary | ICD-10-CM | POA: Diagnosis not present

## 2016-10-14 DIAGNOSIS — G5622 Lesion of ulnar nerve, left upper limb: Secondary | ICD-10-CM | POA: Diagnosis not present

## 2016-10-29 DIAGNOSIS — M65321 Trigger finger, right index finger: Secondary | ICD-10-CM | POA: Diagnosis not present

## 2016-10-29 DIAGNOSIS — M65341 Trigger finger, right ring finger: Secondary | ICD-10-CM | POA: Diagnosis not present

## 2016-10-29 DIAGNOSIS — M65842 Other synovitis and tenosynovitis, left hand: Secondary | ICD-10-CM | POA: Diagnosis not present

## 2016-10-29 DIAGNOSIS — G5622 Lesion of ulnar nerve, left upper limb: Secondary | ICD-10-CM | POA: Diagnosis not present

## 2016-10-29 DIAGNOSIS — G5602 Carpal tunnel syndrome, left upper limb: Secondary | ICD-10-CM | POA: Diagnosis not present

## 2016-10-29 DIAGNOSIS — G5621 Lesion of ulnar nerve, right upper limb: Secondary | ICD-10-CM | POA: Diagnosis not present

## 2016-11-04 ENCOUNTER — Other Ambulatory Visit: Payer: Self-pay | Admitting: Neurology

## 2016-11-13 DIAGNOSIS — M25642 Stiffness of left hand, not elsewhere classified: Secondary | ICD-10-CM | POA: Diagnosis not present

## 2016-11-19 DIAGNOSIS — M25642 Stiffness of left hand, not elsewhere classified: Secondary | ICD-10-CM | POA: Diagnosis not present

## 2016-11-26 DIAGNOSIS — M25642 Stiffness of left hand, not elsewhere classified: Secondary | ICD-10-CM | POA: Diagnosis not present

## 2016-12-02 DIAGNOSIS — M25642 Stiffness of left hand, not elsewhere classified: Secondary | ICD-10-CM | POA: Diagnosis not present

## 2016-12-03 DIAGNOSIS — M109 Gout, unspecified: Secondary | ICD-10-CM | POA: Diagnosis not present

## 2016-12-03 DIAGNOSIS — D692 Other nonthrombocytopenic purpura: Secondary | ICD-10-CM | POA: Diagnosis not present

## 2016-12-03 DIAGNOSIS — G2581 Restless legs syndrome: Secondary | ICD-10-CM | POA: Diagnosis not present

## 2016-12-03 DIAGNOSIS — Z23 Encounter for immunization: Secondary | ICD-10-CM | POA: Diagnosis not present

## 2016-12-03 DIAGNOSIS — E038 Other specified hypothyroidism: Secondary | ICD-10-CM | POA: Diagnosis not present

## 2016-12-03 DIAGNOSIS — E7800 Pure hypercholesterolemia, unspecified: Secondary | ICD-10-CM | POA: Diagnosis not present

## 2016-12-03 DIAGNOSIS — I10 Essential (primary) hypertension: Secondary | ICD-10-CM | POA: Diagnosis not present

## 2016-12-03 DIAGNOSIS — G4733 Obstructive sleep apnea (adult) (pediatric): Secondary | ICD-10-CM | POA: Diagnosis not present

## 2016-12-03 DIAGNOSIS — Z6835 Body mass index (BMI) 35.0-35.9, adult: Secondary | ICD-10-CM | POA: Diagnosis not present

## 2016-12-03 DIAGNOSIS — E1140 Type 2 diabetes mellitus with diabetic neuropathy, unspecified: Secondary | ICD-10-CM | POA: Diagnosis not present

## 2017-01-29 DIAGNOSIS — G5622 Lesion of ulnar nerve, left upper limb: Secondary | ICD-10-CM | POA: Diagnosis not present

## 2017-01-29 DIAGNOSIS — Z09 Encounter for follow-up examination after completed treatment for conditions other than malignant neoplasm: Secondary | ICD-10-CM | POA: Diagnosis not present

## 2017-01-29 DIAGNOSIS — M7022 Olecranon bursitis, left elbow: Secondary | ICD-10-CM | POA: Diagnosis not present

## 2017-01-30 DIAGNOSIS — L820 Inflamed seborrheic keratosis: Secondary | ICD-10-CM | POA: Diagnosis not present

## 2017-02-06 DIAGNOSIS — G4733 Obstructive sleep apnea (adult) (pediatric): Secondary | ICD-10-CM | POA: Diagnosis not present

## 2017-03-12 DIAGNOSIS — G5622 Lesion of ulnar nerve, left upper limb: Secondary | ICD-10-CM | POA: Diagnosis not present

## 2017-03-12 DIAGNOSIS — M7032 Other bursitis of elbow, left elbow: Secondary | ICD-10-CM | POA: Diagnosis not present

## 2017-04-04 DIAGNOSIS — Z6835 Body mass index (BMI) 35.0-35.9, adult: Secondary | ICD-10-CM | POA: Diagnosis not present

## 2017-04-04 DIAGNOSIS — E038 Other specified hypothyroidism: Secondary | ICD-10-CM | POA: Diagnosis not present

## 2017-04-04 DIAGNOSIS — D692 Other nonthrombocytopenic purpura: Secondary | ICD-10-CM | POA: Diagnosis not present

## 2017-04-04 DIAGNOSIS — I10 Essential (primary) hypertension: Secondary | ICD-10-CM | POA: Diagnosis not present

## 2017-04-04 DIAGNOSIS — G6289 Other specified polyneuropathies: Secondary | ICD-10-CM | POA: Diagnosis not present

## 2017-04-04 DIAGNOSIS — M109 Gout, unspecified: Secondary | ICD-10-CM | POA: Diagnosis not present

## 2017-04-04 DIAGNOSIS — Z1389 Encounter for screening for other disorder: Secondary | ICD-10-CM | POA: Diagnosis not present

## 2017-04-04 DIAGNOSIS — G4733 Obstructive sleep apnea (adult) (pediatric): Secondary | ICD-10-CM | POA: Diagnosis not present

## 2017-04-04 DIAGNOSIS — E1140 Type 2 diabetes mellitus with diabetic neuropathy, unspecified: Secondary | ICD-10-CM | POA: Diagnosis not present

## 2017-04-04 DIAGNOSIS — E7800 Pure hypercholesterolemia, unspecified: Secondary | ICD-10-CM | POA: Diagnosis not present

## 2017-04-04 DIAGNOSIS — N401 Enlarged prostate with lower urinary tract symptoms: Secondary | ICD-10-CM | POA: Diagnosis not present

## 2017-04-23 DIAGNOSIS — G4733 Obstructive sleep apnea (adult) (pediatric): Secondary | ICD-10-CM | POA: Diagnosis not present

## 2017-05-03 ENCOUNTER — Other Ambulatory Visit: Payer: Self-pay | Admitting: Neurology

## 2017-06-11 DIAGNOSIS — H35373 Puckering of macula, bilateral: Secondary | ICD-10-CM | POA: Diagnosis not present

## 2017-07-09 DIAGNOSIS — I10 Essential (primary) hypertension: Secondary | ICD-10-CM | POA: Diagnosis not present

## 2017-07-09 DIAGNOSIS — E7800 Pure hypercholesterolemia, unspecified: Secondary | ICD-10-CM | POA: Diagnosis not present

## 2017-07-09 DIAGNOSIS — E038 Other specified hypothyroidism: Secondary | ICD-10-CM | POA: Diagnosis not present

## 2017-07-09 DIAGNOSIS — E1140 Type 2 diabetes mellitus with diabetic neuropathy, unspecified: Secondary | ICD-10-CM | POA: Diagnosis not present

## 2017-07-09 DIAGNOSIS — M109 Gout, unspecified: Secondary | ICD-10-CM | POA: Diagnosis not present

## 2017-07-09 DIAGNOSIS — D692 Other nonthrombocytopenic purpura: Secondary | ICD-10-CM | POA: Diagnosis not present

## 2017-07-09 DIAGNOSIS — Z6835 Body mass index (BMI) 35.0-35.9, adult: Secondary | ICD-10-CM | POA: Diagnosis not present

## 2017-07-09 DIAGNOSIS — G4733 Obstructive sleep apnea (adult) (pediatric): Secondary | ICD-10-CM | POA: Diagnosis not present

## 2017-07-09 DIAGNOSIS — G6289 Other specified polyneuropathies: Secondary | ICD-10-CM | POA: Diagnosis not present

## 2017-07-09 DIAGNOSIS — G2581 Restless legs syndrome: Secondary | ICD-10-CM | POA: Diagnosis not present

## 2017-07-31 DIAGNOSIS — Z6834 Body mass index (BMI) 34.0-34.9, adult: Secondary | ICD-10-CM | POA: Diagnosis not present

## 2017-07-31 DIAGNOSIS — E119 Type 2 diabetes mellitus without complications: Secondary | ICD-10-CM | POA: Diagnosis not present

## 2017-07-31 DIAGNOSIS — I10 Essential (primary) hypertension: Secondary | ICD-10-CM | POA: Diagnosis not present

## 2017-08-27 ENCOUNTER — Encounter: Payer: Self-pay | Admitting: Neurology

## 2017-08-28 ENCOUNTER — Encounter: Payer: Self-pay | Admitting: Neurology

## 2017-08-28 ENCOUNTER — Ambulatory Visit: Payer: PPO | Admitting: Neurology

## 2017-08-28 DIAGNOSIS — G629 Polyneuropathy, unspecified: Secondary | ICD-10-CM

## 2017-08-28 DIAGNOSIS — Z9989 Dependence on other enabling machines and devices: Secondary | ICD-10-CM

## 2017-08-28 DIAGNOSIS — G4733 Obstructive sleep apnea (adult) (pediatric): Secondary | ICD-10-CM | POA: Diagnosis not present

## 2017-08-28 MED ORDER — GABAPENTIN 300 MG PO CAPS: 900.0000 mg | ORAL_CAPSULE | Freq: Every day | ORAL | 3 refills | Status: DC

## 2017-08-28 NOTE — Progress Notes (Signed)
PATIENT: Anthony [name] DOB: March 09, 1945  REASON FOR VISIT: follow up- OSA, peripheral neuropathy HISTORY FROM: patient  HISTORY OF PRESENT ILLNESS:  I have the pleasure of seeing [name] today, meanwhile 72 year old Caucasian left-handed gentleman with a history of obstructive sleep apnea and treated with CPAP.  The patient also has diabetic neuropathy and he is followed for many of his prescriptions and general primary needs by the [place].  He presents today on 28 August 2017 with his usual high compliance of 100% and average use at time of 7 hours 41 minutes with a residual AHI of 4.5.  His AutoSet machine allows for a window of pressure between 8 and 13 cmH2O was 2 cm expiratory pressure relief he does not have major air leaks, his AHI only peaked for about 3 out of the last 30 days and I am can only speculate what led to it.   He is using nose pillows which may interfere with the airflow as he has also nasal congestion / allergic rhinitis.  He endorsed the fatigue severity score of 21 points and the Epworth sleepiness score at 9 points, he did not endorse depression. His neuropathy is of burning quality, his planta pedi are " on fire" - gabapentin at night only helps with that.      Date ? MM: [name] is a 72 year old male with a history of obstructive sleep apnea , RLS and peripheral neuropathy.  He returns today for an evaluation. His compliance download indicates that he uses machine 30 out of 30 days for compliance of 100%. He uses machine greater than 4 hours 30 out of 30 days for compliance of 100%. On average he uses his machine 8 hours and 17 minutes. His residual AHI is 2.9 on a minimum pressure of 8 cm of water and a maximum pressure of 13 cm of water with EPR of 2. The patient does not have a significant leak. Patient states that the increasing gabapentin was initially beneficial for his discomfort. He states that he only has significant discomfort at bedtime. He states  that he has burning and tingling in the lower legs. He describes it as a hot poker feeling on the bottom of his feet. Denies any changes with his gait or balance. He is only taking Gabapentin 600 mg at bedtime. He returns today for an evaluation.  08-23-2014 [name] is using CPAP at a 96 pressure percentile of 12 cm water in form of an AutoSet between 8 and 13 cm water with 2 cm water expiratory pressure relief. Residual AHI is 1.4 with 100% compliance average user time is 7 hours 42 minutes. He had trouble with [facility] billing services and has changed to our aero care. I would encourage him to try a new mask if he feels that it could be more comfortable, he has seen the dream wear mask in our office. Epworth sleepiness score is endorsed at 12 points, fatigue severity 34 points, geriatric depression score 10 out of 15 points. We will address this today as well. Since the patient still has burning feet at night but no longer restless legs on Neurontin 900 mg nightly I suggested a lidocaine or ropivacaine cream. As for his clinical depression; his retirement is not what he hoped for. He supports still his 8 year old daughter and her 42 year old sons.    HISTORY 08/23/14: Anthony [name] is a 72 year old male with a history of obstructive sleep apnea and numbness  and tingling in the lower extremities. He returns today for follow-up. The patient's CPAP download indicates that he uses machine 30 out of 30 days for compliance 100%. He uses machine greater than 4 hours 29 out of 30 days for compliance of 97%. On average he uses machine 7 hours and 2 minutes. His AHI is 3 with a minimum pressure of 8 cm of water and maximum pressure of 13 cm of water with EPR of 2. The patient does not have a significant leak. Overall patient feels that the CPAP has been beneficial however he states in the last 5-6 weeks he has not been sleeping as well. He states that he wakes up with his legs hurting and has been having  night sweats. The patient has an appointment tomorrow with his primary care provider to talk about the night sweats. His Epworth score today is 16 and fatigue severity score is 39. He uses gabapentin 100 mg in the morning and at noon and 400 mg at bedtime. Initially this worked well for his symptoms. However he states that now he is waking up in the middle the night with his legs "aching.". He denies any new symptoms. Since the last visit the patient was diagnosed with pancreatitis and an enlarged liver. He returns today for an evaluation.  REVIEW OF SYSTEMS: Out of a complete 14 system review of symptoms, the patient complains only of the following symptoms, and all other reviewed systems are negative. He is using nose pillows which may interfere with the airflow as he has also nasal congestion / allergic rhinitis.  He endorsed the fatigue severity score of 21 points and the Epworth sleepiness score at 9 points, he did not endorse depression. His neuropathy is of burning quality, his planta pedi are " on fire" - gabapentin at night only helps with that.    ALLERGIES: Allergies  Allergen Reactions  . Carbidopa-Levodopa Other (See Comments)    Made skin crawl     HOME MEDICATIONS: Outpatient Medications Prior to Visit  Medication Sig Dispense Refill  . allopurinol (ZYLOPRIM) 300 MG tablet Take 300 mg by mouth daily.     . Alogliptin Benzoate 25 MG TABS Take by mouth daily.    . colchicine 0.6 MG tablet Take 0.6 mg by mouth daily as needed (for gout).    . furosemide (LASIX) 40 MG tablet Take 40 mg by mouth daily.    Marland Kitchen gabapentin (NEURONTIN) 300 MG capsule TAKE 3 CAPSULES AT BEDTIME 270 capsule 1  . lisinopril (PRINIVIL,ZESTRIL) 40 MG tablet Take 1 tablet by mouth daily.     . metFORMIN (GLUCOPHAGE) 500 MG tablet 2 (two) times daily with a meal.     . pantoprazole (PROTONIX) 40 MG tablet Take 40 mg by mouth daily.     . simvastatin (ZOCOR) 40 MG tablet Take 40 mg by mouth every evening.       Marland Kitchen SYNTHROID 25 MCG tablet Take 25 mcg by mouth daily.     . tamsulosin (FLOMAX) 0.4 MG CAPS capsule Take 0.4 mg by mouth every evening.     Marland Kitchen TOUJEO SOLOSTAR 300 UNIT/ML SOPN Inject 36 Units into the skin daily.  3  . Investigational - Study Medication Take 5 tablets by mouth every morning.    Marland Kitchen JANUVIA 100 MG tablet Take 100 mg by mouth daily.  3  . JARDIANCE 25 MG TABS tablet Take 25 mg by mouth daily.    . mometasone (NASONEX) 50 MCG/ACT nasal spray Place 2 sprays  into the nose daily. 17 g 12   No facility-administered medications prior to visit.     PAST MEDICAL HISTORY: Past Medical History:  Diagnosis Date  . BPH (benign prostatic hyperplasia)   . Diabetes mellitus type 2, controlled ([place])   . Fatty liver   . GERD (gastroesophageal reflux disease)   . Gout    last flare up last week right ankle   . HTN (hypertension)   . Hyperlipidemia   . Hypothyroid   . 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 Obesity (BMI 30.0-34.9) 05/14/2013  . OSA on CPAP    cpap setting of 10/ 13  . Pancreatitis dx march 2016  . Pneumonia 12 years ago    PAST SURGICAL HISTORY: Past Surgical History:  Procedure Laterality Date  . BACK SURGERY  10/2009   Cervical, arterior  . CARPAL TUNNEL RELEASE Left 2003  . CARPAL TUNNEL RELEASE Bilateral   . CATARACT EXTRACTION Left 01/2012  . EUS N/A 07/15/2014   Procedure: FULL UPPER ENDOSCOPIC ULTRASOUND (EUS) RADIAL;  Surgeon: Carol Ada, MD;  Location: [facility];  Service: Endoscopy;  Laterality: N/A;  . NASAL SINUS SURGERY  1981  . RETINAL Left 06/2013   Retinal peel  . SHOULDER SURGERY Right 2003    FAMILY HISTORY: Family History  Problem Relation Age of Onset  . Colon cancer Father   . Lung cancer Brother   . Colon cancer Maternal Grandfather   . Heart Problems Maternal Grandfather   . Diabetes Maternal Grandmother     SOCIAL HISTORY: Social History   Socioeconomic History  . Marital status: Married    Spouse name: [name]  . Number  of children: 2  . Years of education: 7  . Highest education level: Not on file  Occupational History  . Occupation: [employer]    Employer: [employer]  Social Needs  . Financial resource strain: Not on file  . Food insecurity:    Worry: Not on file    Inability: Not on file  . Transportation needs:    Medical: Not on file    Non-medical: Not on file  Tobacco Use  . Smoking status: Never Smoker  . Smokeless tobacco: Never Used  Substance and Sexual Activity  . Alcohol use: No  . Drug use: No  . Sexual activity: Not on file  Lifestyle  . Physical activity:    Days per week: Not on file    Minutes per session: Not on file  . Stress: Not on file  Relationships  . Social connections:    Talks on phone: Not on file    Gets together: Not on file    Attends religious service: Not on file    Active member of club or organization: Not on file    Attends meetings of clubs or organizations: Not on file    Relationship status: Not on file  . Intimate partner violence:    Fear of current or ex partner: Not on file    Emotionally abused: Not on file    Physically abused: Not on file    Forced sexual activity: Not on file  Other Topics Concern  . Not on file  Social History Narrative   Patient is married [name]) and lives at home with his wife.   Patient has two children (twins).   Patient is retired.   Patient has a high school education.   Patient does not drink any caffeine.   Patient is left-handed.      PHYSICAL  EXAM  No ankle edema, Respiratory rate 14 at rest. Easily SOB when walking mores than 250 meters.   Vitals:   08/28/17 0812  BP: (!) 144/82  Pulse: 84  Weight: 241 lb (109.3 kg)  Height: 5' 10.5" (1.791 m)   Body mass index is 34.09 kg/m.  Generalized: Well developed, in no acute distress, well groomed  Neck: Circumference 20.75 inches, with a visible goiter.   Mallampati 4+  Neurological examination  Mentation: Alert oriented to time,  place, history taking. Follows all commands speech and language fluent Cranial nerve: taste and smell intact. Pupils are round and reactive to light and accomodation.  Extraocular movements were full. Facial sensation and strength were normal. Uvula and tongue midline.  Head turning and shoulder shrug are symmetric. Motor: Full strength of all 4 extremities. Elbow surgery to correct ulnar neuropathy-  symmetric motor tone is noted throughout.  Sensory: lateral 2 fingers of the dominant left hand.   Coordination: Cerebellar testing reveals good finger-nose-finger and heel-to-shin bilaterally.  Gait and station: Gait is normal. No falls.  Reflexes: Deep tendon reflexes are symmetric except for left antebrachial reflex.    DIAGNOSTIC DATA (LABS, IMAGING, TESTING) - I reviewed patient records, labs, notes, testing and imaging myself where available.   ASSESSMENT AND PLAN 72 y.o. year old male  has a past medical history of BPH (benign prostatic hyperplasia), Diabetes mellitus type 2, controlled ([place]), Fatty liver, GERD (gastroesophageal reflux disease), Gout, HTN (hypertension), Hyperlipidemia, Hypothyroid, Nasal septal deformity (05/14/2013), Obesity (BMI 30.0-34.9) (05/14/2013), OSA on CPAP, Pancreatitis (dx march 2016), and Pneumonia (12 years ago). here with:  1. Obstructive sleep apnea on CPAP, [name] is using CPAP at a 96 pressure percentile of 12.3 cm water in form of an AutoSet between 8 and 13 cm water with 2 cm water expiratory pressure relief. Residual AHI has risen to 4.5 /h from 1.4 with 100% compliance . 2. 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 He is doing well with 900 mg at night only.  The restless legs are improved, the pain in the feet not as much. I will try to get a mixture of local anesthetic takes as an appointment for this patient and see if this will help his  foot pain at night. He wears no longer socks.  3. Restless legs- inmpoved on neurontin 4. Nocturia- 2-4 times at night ! Can related to DM and BPH, not longer to OSA.   The patient CPAP download is excellent. Yearly follow up .    The patient will continue his  oral gabapentin 900 mg at bedtime. I discussed with the patient about slitting his dose. He could take 1 tablet at dinner time and 2 tablets at bedtime.He will follow-up in 12 months or sooner if needed.  Shandee Jergens ,  08/28/2017, 8:42 AM [facility] [address], [place],  [address] [phone]

## 2017-09-02 DIAGNOSIS — M109 Gout, unspecified: Secondary | ICD-10-CM | POA: Diagnosis not present

## 2017-09-02 DIAGNOSIS — R82998 Other abnormal findings in urine: Secondary | ICD-10-CM | POA: Diagnosis not present

## 2017-09-02 DIAGNOSIS — E038 Other specified hypothyroidism: Secondary | ICD-10-CM | POA: Diagnosis not present

## 2017-09-02 DIAGNOSIS — E1140 Type 2 diabetes mellitus with diabetic neuropathy, unspecified: Secondary | ICD-10-CM | POA: Diagnosis not present

## 2017-09-02 DIAGNOSIS — E7800 Pure hypercholesterolemia, unspecified: Secondary | ICD-10-CM | POA: Diagnosis not present

## 2017-09-02 DIAGNOSIS — Z125 Encounter for screening for malignant neoplasm of prostate: Secondary | ICD-10-CM | POA: Diagnosis not present

## 2017-09-09 DIAGNOSIS — H18413 Arcus senilis, bilateral: Secondary | ICD-10-CM | POA: Diagnosis not present

## 2017-09-09 DIAGNOSIS — H2511 Age-related nuclear cataract, right eye: Secondary | ICD-10-CM | POA: Diagnosis not present

## 2017-09-09 DIAGNOSIS — H25011 Cortical age-related cataract, right eye: Secondary | ICD-10-CM | POA: Diagnosis not present

## 2017-09-09 DIAGNOSIS — H25041 Posterior subcapsular polar age-related cataract, right eye: Secondary | ICD-10-CM | POA: Diagnosis not present

## 2017-09-09 DIAGNOSIS — Z961 Presence of intraocular lens: Secondary | ICD-10-CM | POA: Diagnosis not present

## 2017-09-09 DIAGNOSIS — H35371 Puckering of macula, right eye: Secondary | ICD-10-CM | POA: Diagnosis not present

## 2017-09-11 DIAGNOSIS — R600 Localized edema: Secondary | ICD-10-CM | POA: Diagnosis not present

## 2017-09-11 DIAGNOSIS — Z1389 Encounter for screening for other disorder: Secondary | ICD-10-CM | POA: Diagnosis not present

## 2017-09-11 DIAGNOSIS — E1140 Type 2 diabetes mellitus with diabetic neuropathy, unspecified: Secondary | ICD-10-CM | POA: Diagnosis not present

## 2017-09-11 DIAGNOSIS — E1165 Type 2 diabetes mellitus with hyperglycemia: Secondary | ICD-10-CM | POA: Diagnosis not present

## 2017-09-11 DIAGNOSIS — G2581 Restless legs syndrome: Secondary | ICD-10-CM | POA: Diagnosis not present

## 2017-09-11 DIAGNOSIS — Z125 Encounter for screening for malignant neoplasm of prostate: Secondary | ICD-10-CM | POA: Diagnosis not present

## 2017-09-11 DIAGNOSIS — Z6834 Body mass index (BMI) 34.0-34.9, adult: Secondary | ICD-10-CM | POA: Diagnosis not present

## 2017-09-11 DIAGNOSIS — C4492 Squamous cell carcinoma of skin, unspecified: Secondary | ICD-10-CM | POA: Diagnosis not present

## 2017-09-11 DIAGNOSIS — Z23 Encounter for immunization: Secondary | ICD-10-CM | POA: Diagnosis not present

## 2017-09-11 DIAGNOSIS — D692 Other nonthrombocytopenic purpura: Secondary | ICD-10-CM | POA: Diagnosis not present

## 2017-09-11 DIAGNOSIS — Z0000 Encounter for general adult medical examination without abnormal findings: Secondary | ICD-10-CM | POA: Diagnosis not present

## 2017-09-11 DIAGNOSIS — M545 Low back pain: Secondary | ICD-10-CM | POA: Diagnosis not present

## 2017-09-15 DIAGNOSIS — Z1212 Encounter for screening for malignant neoplasm of rectum: Secondary | ICD-10-CM | POA: Diagnosis not present

## 2017-09-19 DIAGNOSIS — H2511 Age-related nuclear cataract, right eye: Secondary | ICD-10-CM | POA: Diagnosis not present

## 2017-10-02 DIAGNOSIS — I10 Essential (primary) hypertension: Secondary | ICD-10-CM | POA: Diagnosis not present

## 2017-10-02 DIAGNOSIS — E1165 Type 2 diabetes mellitus with hyperglycemia: Secondary | ICD-10-CM | POA: Diagnosis not present

## 2017-10-16 DIAGNOSIS — L738 Other specified follicular disorders: Secondary | ICD-10-CM | POA: Diagnosis not present

## 2017-10-20 DIAGNOSIS — E1165 Type 2 diabetes mellitus with hyperglycemia: Secondary | ICD-10-CM | POA: Diagnosis not present

## 2017-10-20 DIAGNOSIS — E162 Hypoglycemia, unspecified: Secondary | ICD-10-CM | POA: Diagnosis not present

## 2017-10-30 ENCOUNTER — Encounter (INDEPENDENT_AMBULATORY_CARE_PROVIDER_SITE_OTHER): Payer: PPO

## 2017-11-05 ENCOUNTER — Ambulatory Visit (INDEPENDENT_AMBULATORY_CARE_PROVIDER_SITE_OTHER): Payer: PPO | Admitting: Family Medicine

## 2017-11-12 DIAGNOSIS — I10 Essential (primary) hypertension: Secondary | ICD-10-CM | POA: Diagnosis not present

## 2017-11-12 DIAGNOSIS — Z6834 Body mass index (BMI) 34.0-34.9, adult: Secondary | ICD-10-CM | POA: Diagnosis not present

## 2017-11-12 DIAGNOSIS — B372 Candidiasis of skin and nail: Secondary | ICD-10-CM | POA: Diagnosis not present

## 2017-11-12 DIAGNOSIS — E1140 Type 2 diabetes mellitus with diabetic neuropathy, unspecified: Secondary | ICD-10-CM | POA: Diagnosis not present

## 2017-11-12 DIAGNOSIS — E1165 Type 2 diabetes mellitus with hyperglycemia: Secondary | ICD-10-CM | POA: Diagnosis not present

## 2017-12-16 DIAGNOSIS — E1165 Type 2 diabetes mellitus with hyperglycemia: Secondary | ICD-10-CM | POA: Diagnosis not present

## 2017-12-16 DIAGNOSIS — M109 Gout, unspecified: Secondary | ICD-10-CM | POA: Diagnosis not present

## 2017-12-16 DIAGNOSIS — E1140 Type 2 diabetes mellitus with diabetic neuropathy, unspecified: Secondary | ICD-10-CM | POA: Diagnosis not present

## 2017-12-16 DIAGNOSIS — G4733 Obstructive sleep apnea (adult) (pediatric): Secondary | ICD-10-CM | POA: Diagnosis not present

## 2017-12-16 DIAGNOSIS — E038 Other specified hypothyroidism: Secondary | ICD-10-CM | POA: Diagnosis not present

## 2017-12-16 DIAGNOSIS — Z6834 Body mass index (BMI) 34.0-34.9, adult: Secondary | ICD-10-CM | POA: Diagnosis not present

## 2017-12-16 DIAGNOSIS — I10 Essential (primary) hypertension: Secondary | ICD-10-CM | POA: Diagnosis not present

## 2017-12-16 DIAGNOSIS — E7800 Pure hypercholesterolemia, unspecified: Secondary | ICD-10-CM | POA: Diagnosis not present

## 2018-02-02 DIAGNOSIS — H53451 Other localized visual field defect, right eye: Secondary | ICD-10-CM | POA: Diagnosis not present

## 2018-02-02 DIAGNOSIS — E119 Type 2 diabetes mellitus without complications: Secondary | ICD-10-CM | POA: Diagnosis not present

## 2018-02-03 DIAGNOSIS — M545 Low back pain: Secondary | ICD-10-CM | POA: Diagnosis not present

## 2018-02-11 DIAGNOSIS — E1165 Type 2 diabetes mellitus with hyperglycemia: Secondary | ICD-10-CM | POA: Diagnosis not present

## 2018-02-11 DIAGNOSIS — H47011 Ischemic optic neuropathy, right eye: Secondary | ICD-10-CM | POA: Diagnosis not present

## 2018-02-11 DIAGNOSIS — K219 Gastro-esophageal reflux disease without esophagitis: Secondary | ICD-10-CM | POA: Diagnosis not present

## 2018-02-11 DIAGNOSIS — Z794 Long term (current) use of insulin: Secondary | ICD-10-CM | POA: Diagnosis not present

## 2018-02-11 DIAGNOSIS — I10 Essential (primary) hypertension: Secondary | ICD-10-CM | POA: Diagnosis not present

## 2018-03-17 DIAGNOSIS — H53451 Other localized visual field defect, right eye: Secondary | ICD-10-CM | POA: Diagnosis not present

## 2018-03-18 DIAGNOSIS — M109 Gout, unspecified: Secondary | ICD-10-CM | POA: Diagnosis not present

## 2018-03-18 DIAGNOSIS — H47011 Ischemic optic neuropathy, right eye: Secondary | ICD-10-CM | POA: Diagnosis not present

## 2018-03-18 DIAGNOSIS — E7800 Pure hypercholesterolemia, unspecified: Secondary | ICD-10-CM | POA: Diagnosis not present

## 2018-03-18 DIAGNOSIS — E038 Other specified hypothyroidism: Secondary | ICD-10-CM | POA: Diagnosis not present

## 2018-03-18 DIAGNOSIS — E1165 Type 2 diabetes mellitus with hyperglycemia: Secondary | ICD-10-CM | POA: Diagnosis not present

## 2018-03-18 DIAGNOSIS — Z6834 Body mass index (BMI) 34.0-34.9, adult: Secondary | ICD-10-CM | POA: Diagnosis not present

## 2018-03-18 DIAGNOSIS — I10 Essential (primary) hypertension: Secondary | ICD-10-CM | POA: Diagnosis not present

## 2018-03-18 DIAGNOSIS — G4733 Obstructive sleep apnea (adult) (pediatric): Secondary | ICD-10-CM | POA: Diagnosis not present

## 2018-03-18 DIAGNOSIS — E1140 Type 2 diabetes mellitus with diabetic neuropathy, unspecified: Secondary | ICD-10-CM | POA: Diagnosis not present

## 2018-04-16 ENCOUNTER — Other Ambulatory Visit: Payer: Self-pay

## 2018-04-16 NOTE — Patient Outreach (Signed)
  [facility]) Care Management Chronic Special Needs Program  04/16/2018  Name: YARNELL ARVIDSON DOB: October 13, 1945  MRN: [id number]  Mr. Kentavious Michele is enrolled in a Chronic Special Needs Plan. RNCM called to review health risk assessment and complete individualized care plan. No answer. HIPPA compliant message left.   Plan: Chronic care management coordinator will attempt outreach in 1-2 business days.  Thea Silversmith, RN, MSN, [place] [phone]

## 2018-04-17 ENCOUNTER — Other Ambulatory Visit: Payer: Self-pay

## 2018-04-17 NOTE — Patient Outreach (Signed)
  [facility]) Care Management Chronic Special Needs Program  04/17/2018  Name: Anthony [name] DOB: May 19, 1945  MRN: [id number]  Anthony [name] is enrolled in a Chronic Special Needs Plan. RNCM called to review health risk assessment and complete individualized care plan. No answer. HIPPA compliant message left. 2nd attempt.  Plan: Chronic care management coordinator will attempt outreach in 1-2 business days.   Thea Silversmith, RN, MSN, [place] [phone]

## 2018-04-20 ENCOUNTER — Other Ambulatory Visit: Payer: Self-pay

## 2018-04-20 NOTE — Patient Outreach (Signed)
  [name]) Care Management Chronic Special Needs Program  04/20/2018  Name: Anthony [name] DOB: 1945/09/06  MRN: [id number]  [name] is enrolled in a Chronic Special Needs Plan. RNCM called to review health risk assessment and complete individualized care plan. No answer. HIPPA compliant message left. RNCM has not been able to reach client in 3 outreach attempts.  Chronic care management coordinator will complete care plan based on health risk assessment and available data.   Plan: RNCM will send care plan to client; send care plan to primary care and send educational material; pharmacy referral due to greater than 8 medications and states sometimes goes without medications due to cost. RNCM will outreach within the next 6 months.  Thea Silversmith, RN, MSN, [place] [phone]

## 2018-04-20 NOTE — Patient Outreach (Signed)
  [facility]) Care Management Chronic Special Needs Program  04/20/2018  Name: Anthony [name] DOB: 07/22/1945  MRN: [id number]  [name] is enrolled in a chronic special needs plan for Diabetes. Chronic Care Management Coordinator telephoned client to review health risk assessment and to develop individualized care plan.  Introduced the chronic care management program, importance of client participation, and taking their care plan to all provider appointments and inpatient facilities.  Reviewed the transition of care process and possible referral to community care management.  Subjective: Return call received from client. [name] reports a history of diabetes and HTN. He denies a history of heart failure, but states he is on furosemide. Client states he has been in the "donut hole" in the past and not been able to afford his medications. Client is without questions or concerns at this time.   RNCM reviewed coronavirus information and encouraged client to stay active.  Care Plan unchanged. See prior note.   Thea Silversmith, RN, MSN, [place] [phone]

## 2018-04-23 ENCOUNTER — Other Ambulatory Visit: Payer: Self-pay | Admitting: Pharmacist

## 2018-04-23 NOTE — Patient Outreach (Addendum)
[facility]) Care Management  [place]  04/23/2018  Anthony [name] Mar 10, 1945 [id number]   Reason for referral: Medication Assistance, Medication Review  Referral source: [place] Management RN with [employer] Current insurance: [employer]   Outreach:  Unsuccessful telephone call attempt #1 to patient.   HIPAA compliant voicemail left requesting a return call  Plan:  Noted that another [facility] discipline has recently mailed patient an unsuccessful outreach letter  Will make 2nd call attempt to patient in 1-2 weeks  Ralene Bathe, PharmD, [place] [phone]

## 2018-04-29 ENCOUNTER — Ambulatory Visit: Payer: Self-pay | Admitting: Pharmacist

## 2018-04-29 ENCOUNTER — Other Ambulatory Visit: Payer: Self-pay | Admitting: Pharmacist

## 2018-04-29 NOTE — Patient Outreach (Addendum)
[place] [facility]) Care Management  [place]   04/29/2018  DEWARREN LEDBETTER 15-Sep-1945 [id number]  Reason for referral: Medication Assistance, Medication Review  Referral source: [place] Management RN with [employer] Current insurance: [employer] C-[employer]  PMHx includes but not limited to:  OSA, obesity, T2DM, HLD, GERD, hypothyroidism, gout, neuropathy  Outreach:  Successful telephone call with Mr. Marple and his spouse.  HIPAA identifiers verified.   Subjective:  Patient reports taking medications from pill bottles.  He states he is doing well with this and does not need an adherence aide at this time.   Does the patient ever forget to take medication?  no Does the patient have problems obtaining medications due to transportation?   no Does the patient have problems obtaining medications due to cost?  Yes - Jardiance + Toujeo  Does the patient feel that medications prescribed are effective?  yes Does the patient ever experience any side effects to the medications prescribed?  Yes - night sweats since starting insulin  Does the patient measure his/her own blood glucose at home?  Yes Does the patient measure his/her own blood pressure at home? No, however spouse has BP machine at home if he needs to check  Objective: Lab Results  Component Value Date   CREATININE 0.9 03/17/2010   CREATININE 1.05 11/10/2009   CREATININE 1.6 (H) 05/09/2007    No results found for: HGBA1C  Lipid Panel  No results found for: CHOL, TRIG, HDL, CHOLHDL, VLDL, LDLCALC, LDLDIRECT  BP Readings from Last 3 Encounters:  08/28/17 (!) 144/82  08/27/16 115/72  08/23/15 120/78    Allergies  Allergen Reactions  . Carbidopa-Levodopa Other (See Comments)    Made skin crawl     Medications Reviewed Today    Reviewed by Rudean Haskell, RPH (Pharmacist) on 04/29/18 at 1332  Med List Status: <None>  Medication Order Taking? Sig Documenting Provider Last  Dose Status Informant  allopurinol (ZYLOPRIM) 300 MG tablet [id number] Yes Take 300 mg by mouth daily.  [provider] Taking Active Self           Med Note Iva Lento, Jaquille Kau E   Wed Apr 29, 2018  1:27 PM)          Discontinued 04/29/18 1332 (Change in therapy)   colchicine 0.6 MG tablet [id number] Yes Take 0.6 mg by mouth daily as needed (for gout). [provider] Taking Active   empagliflozin (JARDIANCE) 25 MG TABS tablet [id number] Yes Take 25 mg by mouth daily. [provider] Taking Active   furosemide (LASIX) 40 MG tablet [id number] Yes Take 40 mg by mouth daily. [provider] Taking Active Self        Discontinued 32/44/01 0272 (Duplicate)   gabapentin (NEURONTIN) 600 MG tablet [id number] Yes Take 600 mg by mouth 2 (two) times daily. Takes in the morning and in the afternoon [provider] Taking Active   gabapentin (NEURONTIN) 800 MG tablet [id number] Yes Take 800 mg by mouth at bedtime. [provider] Taking Active   lisinopril (PRINIVIL,ZESTRIL) 40 MG tablet [id number] Yes Take 1 tablet by mouth daily.  [provider] Taking Active Self           Med Note Iva Lento, Peng Thorstenson E   Wed Apr 29, 2018  1:27 PM)    metFORMIN (GLUCOPHAGE) 500 MG tablet [id number] Yes 500 mg 2 (two) times daily with a meal.  [provider] Taking Active   Multiple Vitamins-Minerals (  PRESERVISION AREDS PO) [id number] Yes Take 1 capsule by mouth 2 (two) times daily. [provider] Taking Active   pantoprazole (PROTONIX) 40 MG tablet [id number] Yes Take 40 mg by mouth daily.  [provider] Taking Active Self           Med Note (Raenell Mensing E   Wed Apr 29, 2018  1:27 PM)    pyridOXINE (VITAMIN B-6) 100 MG tablet [id number] Yes Take 100 mg by mouth daily. [provider] Taking Active   simvastatin (ZOCOR) 40 MG tablet [id number] Yes Take 40 mg by mouth every evening.  [provider] Taking Active Self           Med  Note Iva Lento, Klarissa Mcilvain E   Wed Apr 29, 2018  1:27 PM)    SYNTHROID 25 MCG tablet [id number] Yes Take 25 mcg by mouth daily.  [provider] Taking Active Self           Med Note (Jaylan Hinojosa E   Wed Apr 29, 2018  1:27 PM)    tamsulosin (FLOMAX) 0.4 MG CAPS capsule [id number] Yes Take 0.4 mg by mouth every evening.  [provider] Taking Active Self           Med Note (Taveon Enyeart E   Wed Apr 29, 2018  1:27 PM)    TOUJEO SOLOSTAR 300 UNIT/ML SOPN [id number] Yes Inject 36 Units into the skin daily. [provider] Taking Active           Assessment:  Drugs sorted by system:  Neurologic/Psychologic: gabapentin  Cardiovascular: furosemide, lisinopril, simvastatin  Gastrointestinal: pantoprazole  Endocrine: Toujeo, empagliflozin, metformin  Genitourinary: tamsulosin  Vitamins/Minerals/Supplements: MVI, vitamin B6  Miscellaneous: allopurinol, colchicine  Medication Review Findings:  . Patient may be experiencing hypoglycemia during the night as evidenced by night sweats, and low fasting CBGs 80s-102s.  He also endorses feeling as if he has low CBGs at other times during the day.  I reviewed signs and symptoms of hypoglycemia with patient and how treat with 15gm glucose, recheck in 15 minutes, repeat as necessary.  Reviewed sources of glucose including honey, juice, and glucose tablets or gel.  Patient reports he will have quick source of glucose on hand at all times.  He will start checking his CBGs in the middle of the night when he starts having night sweats and more often during the day.  Will also contact PCP to provide update on patient's symptoms. He has appt with PCP next month but I advised him to contact PCP sooner if symptoms continue and to make sure he keeps a record of all CBGs.  . Increased risk of myopathy with simvastatin and colchicine, however patient only taking colchicine PRN, monitor signs and symptoms of myopathy and adjust as  needed . Unknown renal function - monitor SCr regularly to assess if renal dose adjustments needed with medication regimen.  Patient sees Dr. Osborne Casco, provider outside [facility].     Medication Adherence Findings: Adherence Review  _0  Excellent (no doses missed/week)     _1  Good (no more than 1 dose missed/week)     _2  Partial (2-3 doses missed/week) _3  Poor (>3 doses missed/week)  Patient with excellent understanding of regimen and excellent understanding of indications.    Potential of compliance: excellent  Medication Assistance Findings:  Medication assistance needs identified.   Extra Help:  Not eligible for Extra Help Low Income Subsidy based on reported income and assets  Patient Assistance Programs: Toujeo made  by [place] requirement met: Yes o Out-of-pocket prescription expenditure met:   No (2% household income) - Patient has not met application requirements to apply for this patient assistance program at this time. - Reviewed program requirements with patients.   Jardiance (empagliflozin) made by [place] requirement met: Yes o Out-of-pocket prescription expenditure met:   Not Applicable - Patient has met application requirements to apply for this patient assistance program.   - Reviewed program requirements with patients.    Plan: . I will route patient assistance letter to [place] technician who will coordinate patient assistance program application process for medications listed above.  [facility] pharmacy technician will assist with obtaining all required documents from both patient and provider(s) and submit application(s) once completed.  . Will contact Dr. Osborne Casco  regarding possible need for Toujeo dose adjustment due to hypoglycemia.  . Will follow-up in 2-3 business days.   Ralene Bathe, PharmD, [place] [phone]   Addendum: Incoming call from Dr. Loren Racer office.  Per provider,  patient should check CBGs during the night and call office if they are low.  No changes at this time to insulin therapy or Toujeo dose.  Next appt in 2 weeks on 05/12/2018.  Patient advised to bring in record of CBGs to visit.    Updated patient accordingly.   If patient's insulin therapy changed at appt, patient encouraged to contact me so I can assess again if he is eligible for patient assistance.   Ralene Bathe, PharmD, [place] [phone]

## 2018-04-30 ENCOUNTER — Other Ambulatory Visit: Payer: Self-pay | Admitting: Pharmacy Technician

## 2018-04-30 NOTE — Patient Outreach (Signed)
[place] [facility]) Care Management  04/30/2018  Anthony [name] August 27, 1945 [id number]                          Medication Assistance Referral  Referral From: [facility] RPh [name]  Medication/Company: Anthony [name] / [employer] Patient application portion:  Mailed Provider application portion: Faxed  to Dr. Osborne Casco   Follow up:  Will follow up with patient in 7-10 business days to confirm application(s) have been received.  [name] [name] [name] Certified Pharmacy Technician [place] Management Direct Dial:[phone]

## 2018-05-12 DIAGNOSIS — Z794 Long term (current) use of insulin: Secondary | ICD-10-CM | POA: Diagnosis not present

## 2018-05-12 DIAGNOSIS — I10 Essential (primary) hypertension: Secondary | ICD-10-CM | POA: Diagnosis not present

## 2018-05-12 DIAGNOSIS — E1165 Type 2 diabetes mellitus with hyperglycemia: Secondary | ICD-10-CM | POA: Diagnosis not present

## 2018-05-13 ENCOUNTER — Other Ambulatory Visit: Payer: Self-pay | Admitting: Pharmacy Technician

## 2018-05-13 NOTE — Patient Outreach (Signed)
[place] [facility]) Care Management  05/13/2018  JAESON MOLSTAD 01-01-1946 [id number]   Received provider portion(s) of patient assistance application for Jardiance. Faxed completed application and required documents into [employer].  Will follow up with company in 7-10 business days to check status of application.  [name] [name] [place] Certified Pharmacy Technician [place] Management Direct Dial:[phone]

## 2018-05-18 ENCOUNTER — Other Ambulatory Visit: Payer: Self-pay | Admitting: Pharmacy Technician

## 2018-05-18 NOTE — Patient Outreach (Signed)
[name] [facility]) Care Management  05/18/2018  Anthony [name] 03-29-1945 [id number]   Incoming call from patient stating he received a letter from [employer] stating he has been approved for Jardiance.   He states that his co-pay for Toujeo will be over $100 and wants a suggestion as to what he can talk to Dr. Osborne Casco to possibly change. Informed him that there is a patient assistance program for Toujeo, however he would need to spend about $470 OOP in order for him to apply for it.  Informed him I will send message to [place] to inform of his inquiry.  Will route note to [place].  [name] [name] [place] Certified Pharmacy Technician [place] Management Direct Dial:[phone]

## 2018-05-19 ENCOUNTER — Other Ambulatory Visit: Payer: Self-pay | Admitting: Pharmacist

## 2018-05-19 NOTE — Patient Outreach (Signed)
[name]) Care Management  [place] 05/19/2018  Anthony [name] Feb 04, 1945 [id number]  Reason for call: medication assistance question regarding Toujeo  Patient spoke with [facility] pharmacy technician yesterday to update that he has been approved for Jardiance patient assistance program.    Patient also had questions about substitution for Toujeo as he is now in the coverage gap and cannot afford copay.    Outreach:  Successful outreach call to Anthony [name] today.  HIPAA identifiers verified.  We reviewed Toujeo patient assistance program requirements including 2% out-of-pocket expenditure which patient states he cannot afford to meet.  We then discussed alternative long-acting insulins and differences with concentrations and dosing.  I advised Anthony [name] to contact his provider, Dr. Osborne Casco, to discuss options.  I provided him with names of [employer]) and Engineer, maintenance) for which there is no out-of-pocket expenditure for 2020 for patient assistance programs.  Patient will discuss this with provider and call me back once decision made on therapy.    Plan: I will await call back from Anthony [name] on long-acting insulin decision.  I will reach out to him next week if I have not heard back yet.   Ralene Bathe, PharmD, [place] [phone]

## 2018-05-25 ENCOUNTER — Other Ambulatory Visit: Payer: Self-pay | Admitting: Pharmacy Technician

## 2018-05-25 ENCOUNTER — Other Ambulatory Visit: Payer: Self-pay | Admitting: Pharmacist

## 2018-05-25 ENCOUNTER — Ambulatory Visit: Payer: Self-pay | Admitting: Pharmacist

## 2018-05-25 NOTE — Patient Outreach (Signed)
[place] [facility]) Care Management  05/25/2018  Anthony [name] 12-Jul-1945 [id number]                          Medication Assistance Referral  Referral From: [place] / [name] Patient application portion:  Mailed Provider application portion: Faxed  to Dr. Osborne Casco   Follow up:  Will follow up with patient in 7-10 business days to confirm application(s) have been received.  [name] [name] [place] Certified Pharmacy Technician [place] Management Direct Dial:[phone]

## 2018-05-25 NOTE — Patient Outreach (Addendum)
[place] [facility]) Care Management  [place] 05/25/2018  Anthony [name] 12/04/1945 [id number]  Reason for call: f/u on insulin decision with PCP   Outreach:  Unsuccessful telephone call attempt #1 to patient. HIPAA compliant voicemail left requesting a return call  Plan:  -I will make another outreach attempt to patient within 3-4 business days.    Ralene Bathe, PharmD, [place] [phone]    Addendum: Incoming call and voicemail received from patient.  Return call placed successfully.  Patient reports he was switched from [place] to [employer], first dose yesterday and reports it is going well so far.  We reviewed patient assistance program application for Basaglar and patient agreeable to apply.   Plan: I will route patient assistance letter to [place] technician who will coordinate patient assistance program application process for medications listed above.  [facility] pharmacy technician will assist with obtaining all required documents from both patient and provider(s) and submit application(s) once completed.    Ralene Bathe, PharmD, [place] [phone]

## 2018-05-29 ENCOUNTER — Ambulatory Visit: Payer: Self-pay | Admitting: Pharmacist

## 2018-06-17 ENCOUNTER — Other Ambulatory Visit: Payer: Self-pay | Admitting: Pharmacist

## 2018-06-17 ENCOUNTER — Other Ambulatory Visit: Payer: Self-pay | Admitting: Pharmacy Technician

## 2018-06-17 NOTE — Patient Outreach (Signed)
[facility] [facility] Care Management  06/17/2018  Anthony [name] 1945-03-21 [id number]    Successful call placed to patient regarding patient assistance application(s) for Basaglar , HIPAA identifiers verified. [name] confirms that he received [place] manager for [employer]. He states that he has found "another way to get it (insulin)". When asked if he does not want to continue the process for Basaglar he states that he doesn't.  [name] confirms that he received his 3 month supply of Jardiance from [employer]. Reviewed with patient how to obtain refills.   Requested that he contact me if has issues obtaining refills and/ or changes his mind about applying for Basaglar.  Follow up:  Will route note to [place] for case closure.  [name] [name] [place] Certified Pharmacy Technician [place] Management Direct Dial:[phone]

## 2018-06-17 NOTE — Patient Outreach (Signed)
[place] [facility]) Care Management [place]  06/17/2018  Anthony [name] 1945-03-13 [id number]  Reason for referral: medication assistance with Basaglar and Jardiance  Patient approved for Jardiance through 2020 and has received medication.   Patient no longer interested in applying for [place].   Plan: -[place] case is being closed due to the following reasons:  Goals have been met. -Patient has been provided [facility] CM contact information if assistance needed in the future.   -Thank you for allowing [facility] pharmacy to be involved in this patient's care.    Ralene Bathe, PharmD, [place] [phone]

## 2018-07-13 DIAGNOSIS — K219 Gastro-esophageal reflux disease without esophagitis: Secondary | ICD-10-CM | POA: Diagnosis not present

## 2018-07-13 DIAGNOSIS — E1165 Type 2 diabetes mellitus with hyperglycemia: Secondary | ICD-10-CM | POA: Diagnosis not present

## 2018-07-13 DIAGNOSIS — R0609 Other forms of dyspnea: Secondary | ICD-10-CM | POA: Diagnosis not present

## 2018-07-13 DIAGNOSIS — E7800 Pure hypercholesterolemia, unspecified: Secondary | ICD-10-CM | POA: Diagnosis not present

## 2018-07-13 DIAGNOSIS — R0789 Other chest pain: Secondary | ICD-10-CM | POA: Diagnosis not present

## 2018-07-13 DIAGNOSIS — I10 Essential (primary) hypertension: Secondary | ICD-10-CM | POA: Diagnosis not present

## 2018-07-14 ENCOUNTER — Other Ambulatory Visit: Payer: Self-pay | Admitting: Internal Medicine

## 2018-07-14 ENCOUNTER — Ambulatory Visit
Admission: RE | Admit: 2018-07-14 | Discharge: 2018-07-14 | Disposition: A | Discharge status: Home / Self Care | Payer: Medicare Other | Source: Ambulatory Visit | Attending: Internal Medicine | Admitting: Internal Medicine

## 2018-07-14 ENCOUNTER — Other Ambulatory Visit: Payer: Self-pay

## 2018-07-14 DIAGNOSIS — E7800 Pure hypercholesterolemia, unspecified: Secondary | ICD-10-CM | POA: Diagnosis not present

## 2018-07-14 DIAGNOSIS — E039 Hypothyroidism, unspecified: Secondary | ICD-10-CM | POA: Diagnosis not present

## 2018-07-14 DIAGNOSIS — R0609 Other forms of dyspnea: Secondary | ICD-10-CM | POA: Diagnosis not present

## 2018-07-14 DIAGNOSIS — G4733 Obstructive sleep apnea (adult) (pediatric): Secondary | ICD-10-CM | POA: Diagnosis not present

## 2018-07-14 DIAGNOSIS — K219 Gastro-esophageal reflux disease without esophagitis: Secondary | ICD-10-CM | POA: Diagnosis not present

## 2018-07-14 DIAGNOSIS — R0789 Other chest pain: Secondary | ICD-10-CM | POA: Diagnosis not present

## 2018-07-14 DIAGNOSIS — I7090 Unspecified atherosclerosis: Secondary | ICD-10-CM | POA: Diagnosis not present

## 2018-07-14 DIAGNOSIS — E1165 Type 2 diabetes mellitus with hyperglycemia: Secondary | ICD-10-CM | POA: Diagnosis not present

## 2018-07-14 DIAGNOSIS — R0600 Dyspnea, unspecified: Secondary | ICD-10-CM

## 2018-07-14 DIAGNOSIS — R0602 Shortness of breath: Secondary | ICD-10-CM | POA: Diagnosis not present

## 2018-07-14 MED ORDER — IOPAMIDOL (ISOVUE-370) INJECTION 76%
75.0000 mL | Freq: Once | INTRAVENOUS | Status: AC | PRN
  Administered 2018-07-14: 75 mL via INTRAVENOUS

## 2018-07-17 ENCOUNTER — Encounter: Payer: Self-pay | Admitting: Cardiology

## 2018-07-17 ENCOUNTER — Other Ambulatory Visit: Payer: Self-pay

## 2018-07-17 ENCOUNTER — Ambulatory Visit (INDEPENDENT_AMBULATORY_CARE_PROVIDER_SITE_OTHER): Payer: HMO | Admitting: Cardiology

## 2018-07-17 VITALS — BP 153/78 | HR 75 | Temp 97.7°F | Ht 71.0 in | Wt 240.5 lb

## 2018-07-17 DIAGNOSIS — I209 Angina pectoris, unspecified: Secondary | ICD-10-CM | POA: Diagnosis not present

## 2018-07-17 DIAGNOSIS — I10 Essential (primary) hypertension: Secondary | ICD-10-CM | POA: Diagnosis not present

## 2018-07-17 DIAGNOSIS — E119 Type 2 diabetes mellitus without complications: Secondary | ICD-10-CM | POA: Diagnosis not present

## 2018-07-17 MED ORDER — ASPIRIN EC 81 MG PO TBEC: 81.0000 mg | DELAYED_RELEASE_TABLET | Freq: Every day | ORAL | 2 refills | Status: DC

## 2018-07-17 MED ORDER — ROSUVASTATIN CALCIUM 10 MG PO TABS: 10.0000 mg | ORAL_TABLET | Freq: Every day | ORAL | 3 refills | Status: DC

## 2018-07-17 NOTE — Progress Notes (Signed)
Patient referred by Haywood Pao, MD for chest pain  Subjective:   Anthony [name], male    DOB: 30-Apr-1945, 73 y.o.   MRN: [id number]   Chief Complaint  Patient presents with  . Chest Pain    HPI  73 y.o. Caucasian male with hypertension, hyperlipidemia, type 2 DM, OSA on CPAP, hypothyroidism, GERD, referred by Dr. Osborne Casco for evaluation of chest pain  Patient has experienced nearly daily episodes of retrosternal chest pain with exertion. Occasionally, symptoms occur with deep breathing. Symptoms always improve with rest.   He is compliant with his medical therapy. Blood pressure is elevated today.  Patient is retired, lives with his wife. Tries to walk regularly.   Past Medical History:  Diagnosis Date  . BPH (benign prostatic hyperplasia)   . Diabetes mellitus type 2, controlled ([place])   . Fatty liver   . GERD (gastroesophageal reflux disease)   . Gout    last flare up last week right ankle   . HTN (hypertension)   . Hyperlipidemia   . Hypothyroid   . 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 Obesity (BMI 30.0-34.9) 05/14/2013  . OSA on CPAP    cpap setting of 10/ 13  . Pancreatitis dx march 2016  . Pneumonia 12 years ago     Past Surgical History:  Procedure Laterality Date  . BACK SURGERY  10/2009   Cervical, arterior  . CARPAL TUNNEL RELEASE Left 2003  . CARPAL TUNNEL RELEASE Bilateral   . CATARACT EXTRACTION Left 01/2012  . EUS N/A 07/15/2014   Procedure: FULL UPPER ENDOSCOPIC ULTRASOUND (EUS) RADIAL;  Surgeon: Carol Ada, MD;  Location: [facility] END[facility]COPY;  Service: Endoscopy;  Laterality: N/A;  . NASAL SINUS SURGERY  1981  . RETINAL Left 06/2013   Retinal peel  . SHOULDER SURGERY Right 2003     Social History   Socioeconomic History  . Marital status: Married    Spouse name: [name]  . Number of children: 2  . Years of education: 14  . Highest education level: Not on file  Occupational History  . Occupation: [employer]    Employer: [employer]  Social Needs  . Financial resource strain: Not on file  . Food insecurity    Worry: Not on file    Inability: Not on file  . Transportation needs    Medical: Not on file    Non-medical: Not on file  Tobacco Use  . Smoking status: Never Smoker  . Smokeless tobacco: Never Used  Substance and Sexual Activity  . Alcohol use: No  . Drug use: No  . Sexual activity: Not on file  Lifestyle  . Physical activity    Days per week: Not on file    Minutes per session: Not on file  . Stress: Not on file  Relationships  . Social Herbalist on phone: Not on file    Gets together: Not on file    Attends religious service: Not on file    Active member of club or organization: Not on file    Attends meetings of clubs or organizations: Not on file    Relationship status: Not on file  . Intimate partner violence    Fear of current or ex partner: Not on file    Emotionally abused: Not on file    Physically abused: Not on file    Forced sexual activity: Not on file  Other Topics Concern  . Not on file  Social  History Narrative   Patient is married [name]) and lives at home with his wife.   Patient has two children (twins).   Patient is retired.   Patient has a high school education.   Patient does not drink any caffeine.   Patient is left-handed.     Family History  Problem Relation Age of Onset  . Colon cancer Father   . Lung cancer Brother   . Colon cancer Maternal Grandfather   . Heart Problems Maternal Grandfather   . Diabetes Maternal Grandmother      Current Outpatient Medications on File Prior to Visit  Medication Sig Dispense Refill  . allopurinol (ZYLOPRIM) 300 MG tablet Take 300 mg by mouth daily.     . colchicine 0.6 MG tablet Take 0.6 mg by mouth daily as needed (for gout).    Marland Kitchen empagliflozin (JARDIANCE) 25 MG TABS tablet Take 25 mg by mouth daily.    . furosemide (LASIX) 40 MG tablet Take 40 mg by mouth daily.    Marland Kitchen gabapentin (NEURONTIN) 600  MG tablet Take 600 mg by mouth 2 (two) times daily. Takes in the morning and in the afternoon    . gabapentin (NEURONTIN) 800 MG tablet Take 800 mg by mouth at bedtime.    Marland Kitchen lisinopril (PRINIVIL,ZESTRIL) 40 MG tablet Take 1 tablet by mouth daily.     . metFORMIN (GLUCOPHAGE) 500 MG tablet 500 mg 2 (two) times daily with a meal.     . Multiple Vitamins-Minerals (PRESERVISION AREDS PO) Take 1 capsule by mouth 2 (two) times daily.    . pantoprazole (PROTONIX) 40 MG tablet Take 40 mg by mouth daily.     Marland Kitchen pyridOXINE (VITAMIN B-6) 100 MG tablet Take 100 mg by mouth daily.    . simvastatin (ZOCOR) 40 MG tablet Take 40 mg by mouth every evening.     Marland Kitchen SYNTHROID 25 MCG tablet Take 25 mcg by mouth daily.     . tamsulosin (FLOMAX) 0.4 MG CAPS capsule Take 0.4 mg by mouth every evening.     Marland Kitchen TOUJEO SOLOSTAR 300 UNIT/ML SOPN Inject 36 Units into the skin daily.  3   No current facility-administered medications on file prior to visit.     Cardiovascular studies:  EKG 07/17/2018: Sinus rhythm 78 bpm. Nonspecific ST-T changes.  EKG 07/14/2018: Sinus rhythm 71 bpm. Normal EKG  CT Chest 07/14/2018: 1. No evidence of pulmonary embolism, although evaluation of the segmental pulmonary arteries is limited due to motion artifact. 2. Diffuse mosaic attenuation of the lungs, nonspecific, but possibly reflecting small airways disease. 3.  Aortic atherosclerosis (ICD10-I70.0).  Recent labs: 02/11/2018: Glucose 95. BUN/Cr 15/1.0. eGFR 73. Na/K 138/4.9. Rest of the CMP normal. H/H 15/47. MCV 90. Platelets 278.  HbA1C 7.2%   Review of Systems  Constitution: Negative for decreased appetite, malaise/fatigue, weight gain and weight loss.  HENT: Negative for congestion.   Eyes: Negative for visual disturbance.  Cardiovascular: Positive for chest pain. Negative for dyspnea on exertion, leg swelling, palpitations and syncope.  Respiratory: Negative for cough.   Endocrine: Negative for cold intolerance.   Hematologic/Lymphatic: Does not bruise/bleed easily.  Skin: Negative for itching and rash.  Musculoskeletal: Negative for myalgias.  Gastrointestinal: Negative for abdominal pain, nausea and vomiting.  Genitourinary: Negative for dysuria.  Neurological: Negative for dizziness and weakness.  Psychiatric/Behavioral: The patient is not nervous/anxious.   All other systems reviewed and are negative.        Vitals:   07/17/18 1337  BP: Marland Kitchen)  153/78  Pulse: 75  Temp: 97.7 F (36.5 C)  SpO2: 95%     Body mass index is 33.54 kg/m. Filed Weights   07/17/18 1337  Weight: 240 lb 8 oz (109.1 kg)     Objective:   Physical Exam  Constitutional: He is oriented to person, place, and time. He appears well-developed and well-nourished. No distress.  HENT:  Head: Normocephalic and atraumatic.  Eyes: Pupils are equal, round, and reactive to light. Conjunctivae are normal.  Neck: No JVD present.  Cardiovascular: Normal rate, regular rhythm and intact distal pulses.  Pulmonary/Chest: Effort normal and breath sounds normal. He has no wheezes. He has no rales.  Abdominal: Soft. Bowel sounds are normal. There is no rebound.  Musculoskeletal:        General: No edema.  Lymphadenopathy:    He has no cervical adenopathy.  Neurological: He is alert and oriented to person, place, and time. No cranial nerve deficit.  Skin: Skin is warm and dry.  Psychiatric: He has a normal mood and affect.  Nursing note and vitals reviewed.         Assessment & Recommendations:   72 y.o. Caucasian male with hypertension, hyperlipidemia, type 2 DM, OSA on CPAP, hypothyroidism, GERD, referred by Dr. Osborne Casco for evaluation of chest pain  Angina pectoris: Patient has daily symptoms of exertional angina.  Started isosorbide mononitrate 30 mg daily, along with as needed sublingual nitroglycerin.  Continue metoprolol succinate 25 mg daily.  Recommend echocardiogram and Lexiscan nuclear stress test.  If symptoms  not improved with medical therapy, or if high risk findings noted on stress test, he will likely need coronary angiography.  Hypertension: Blood pressure elevated today.  Continue current antihypertensive therapy, along with addition of isosorbide mononitrate.  Type 2 diabetes mellitus without complication: Continue current management as per PCP.  Thank you for referring the patient to [place]. Please feel free to contact with any questions.  Nigel Mormon, MD [facility] Cardiovascular. [place] Pager: [phone] Office: [phone] If no answer Cell [phone]

## 2018-07-18 ENCOUNTER — Encounter: Payer: Self-pay | Admitting: Cardiology

## 2018-07-18 MED ORDER — NITROGLYCERIN 0.4 MG SL SUBL: 0.4000 mg | SUBLINGUAL_TABLET | SUBLINGUAL | 3 refills | Status: DC | PRN

## 2018-07-18 MED ORDER — ISOSORBIDE MONONITRATE ER 30 MG PO TB24: 30.0000 mg | ORAL_TABLET | Freq: Every day | ORAL | 3 refills | Status: DC

## 2018-07-23 ENCOUNTER — Ambulatory Visit (HOSPITAL_COMMUNITY)
Admission: RE | Admit: 2018-07-23 | Discharge: 2018-07-23 | Disposition: A | Discharge status: Home / Self Care | Payer: HMO | Source: Ambulatory Visit | Attending: Cardiology | Admitting: Cardiology

## 2018-07-23 ENCOUNTER — Other Ambulatory Visit: Payer: Self-pay

## 2018-07-23 DIAGNOSIS — R0789 Other chest pain: Secondary | ICD-10-CM | POA: Diagnosis not present

## 2018-07-23 DIAGNOSIS — E669 Obesity, unspecified: Secondary | ICD-10-CM | POA: Diagnosis not present

## 2018-07-23 DIAGNOSIS — I209 Angina pectoris, unspecified: Secondary | ICD-10-CM | POA: Insufficient documentation

## 2018-07-23 DIAGNOSIS — G4733 Obstructive sleep apnea (adult) (pediatric): Secondary | ICD-10-CM

## 2018-07-23 DIAGNOSIS — I358 Other nonrheumatic aortic valve disorders: Secondary | ICD-10-CM | POA: Insufficient documentation

## 2018-07-29 DIAGNOSIS — L738 Other specified follicular disorders: Secondary | ICD-10-CM | POA: Diagnosis not present

## 2018-08-05 ENCOUNTER — Other Ambulatory Visit: Payer: Self-pay

## 2018-08-05 ENCOUNTER — Ambulatory Visit (INDEPENDENT_AMBULATORY_CARE_PROVIDER_SITE_OTHER): Payer: HMO

## 2018-08-05 ENCOUNTER — Ambulatory Visit: Payer: HMO | Admitting: Cardiology

## 2018-08-05 VITALS — BP 135/70 | HR 80

## 2018-08-05 DIAGNOSIS — I209 Angina pectoris, unspecified: Secondary | ICD-10-CM

## 2018-08-05 DIAGNOSIS — I10 Essential (primary) hypertension: Secondary | ICD-10-CM

## 2018-08-05 DIAGNOSIS — E119 Type 2 diabetes mellitus without complications: Secondary | ICD-10-CM | POA: Diagnosis not present

## 2018-08-05 NOTE — Progress Notes (Addendum)
Patient referred by Haywood Pao, MD for chest pain  Subjective:   Anthony [name], male    DOB: [birth year], 73 y.o.   MRN: [id number]   Chief Complaint  Patient presents with  . Chest Pain    HPI  73 y.o. Caucasian male with hypertension, hyperlipidemia, type 2 DM, OSA on CPAP, hypothyroidism, GERD, with exertional chest pain.   Patient's angina symptoms have decreased from multiple times/day to 3-4 times/week with current antianginal therapy. Blood pressure is better controlled. Patient underwent echocardiogram and stresst test, results below.   Past Medical History:  Diagnosis Date  . BPH (benign prostatic hyperplasia)   . Diabetes mellitus type 2, controlled ([place])   . Fatty liver   . GERD (gastroesophageal reflux disease)   . Gout    last flare up last week right ankle   . HTN (hypertension)   . Hyperlipidemia   . Hypothyroid   . 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 Obesity (BMI 30.0-34.9) 05/14/2013  . OSA on CPAP    cpap setting of 10/ 13  . Pancreatitis dx march 2016  . Pneumonia 12 years ago     Past Surgical History:  Procedure Laterality Date  . BACK SURGERY  10/2009   Cervical, arterior  . CARPAL TUNNEL RELEASE Left 2003  . CARPAL TUNNEL RELEASE Bilateral   . CATARACT EXTRACTION Left 01/2012  . EUS N/A 07/15/2014   Procedure: FULL UPPER ENDOSCOPIC ULTRASOUND (EUS) RADIAL;  Surgeon: Carol Ada, MD;  Location: [facility];  Service: Endoscopy;  Laterality: N/A;  . NASAL SINUS SURGERY  1981  . RETINAL Left 06/2013   Retinal peel  . SHOULDER SURGERY Right 2003     Social History   Socioeconomic History  . Marital status: Married    Spouse name: [name]  . Number of children: 2  . Years of education: 32  . Highest education level: Not on file  Occupational History  . Occupation: [employer]    Employer: [employer]  Social Needs  . Financial resource strain: Not on file  . Food insecurity    Worry: Not on file    Inability: Not  on file  . Transportation needs    Medical: Not on file    Non-medical: Not on file  Tobacco Use  . Smoking status: Never Smoker  . Smokeless tobacco: Never Used  Substance and Sexual Activity  . Alcohol use: No  . Drug use: No  . Sexual activity: Not on file  Lifestyle  . Physical activity    Days per week: Not on file    Minutes per session: Not on file  . Stress: Not on file  Relationships  . Social Herbalist on phone: Not on file    Gets together: Not on file    Attends religious service: Not on file    Active member of club or organization: Not on file    Attends meetings of clubs or organizations: Not on file    Relationship status: Not on file  . Intimate partner violence    Fear of current or ex partner: Not on file    Emotionally abused: Not on file    Physically abused: Not on file    Forced sexual activity: Not on file  Other Topics Concern  . Not on file  Social History Narrative   Patient is married [name]) and lives at home with his wife.   Patient has two children (twins).   Patient  is retired.   Patient has a high school education.   Patient does not drink any caffeine.   Patient is left-handed.     Family History  Problem Relation Age of Onset  . Colon cancer Father   . Lung cancer Brother   . Colon cancer Maternal Grandfather   . Heart Problems Maternal Grandfather   . Diabetes Maternal Grandmother      Current Outpatient Medications on File Prior to Visit  Medication Sig Dispense Refill  . allopurinol (ZYLOPRIM) 300 MG tablet Take 300 mg by mouth daily.     Marland Kitchen aspirin EC 81 MG tablet Take 1 tablet (81 mg total) by mouth daily. 30 tablet 2  . colchicine 0.6 MG tablet Take 0.6 mg by mouth daily as needed (for gout).    Marland Kitchen empagliflozin (JARDIANCE) 25 MG TABS tablet Take 25 mg by mouth daily.    . furosemide (LASIX) 40 MG tablet Take 40 mg by mouth daily.    Marland Kitchen gabapentin (NEURONTIN) 600 MG tablet Take 600 mg by mouth 2 (two) times  daily. Takes in the morning and in the afternoon    . gabapentin (NEURONTIN) 800 MG tablet Take 800 mg by mouth at bedtime.    . isosorbide mononitrate (IMDUR) 30 MG 24 hr tablet Take 1 tablet (30 mg total) by mouth daily. 30 tablet 3  . lisinopril (PRINIVIL,ZESTRIL) 40 MG tablet Take 1 tablet by mouth daily.     . metFORMIN (GLUCOPHAGE) 500 MG tablet 500 mg 2 (two) times daily with a meal.     . metoprolol succinate (TOPROL-XL) 25 MG 24 hr tablet Take 25 mg by mouth daily.    . Multiple Vitamins-Minerals (PRESERVISION AREDS PO) Take 1 capsule by mouth 2 (two) times daily.    . nitroGLYCERIN (NITROSTAT) 0.4 MG SL tablet Place 1 tablet (0.4 mg total) under the tongue every 5 (five) minutes as needed for chest pain. 30 tablet 3  . pantoprazole (PROTONIX) 40 MG tablet Take 40 mg by mouth daily.     Marland Kitchen pyridOXINE (VITAMIN B-6) 100 MG tablet Take 100 mg by mouth daily.    . rosuvastatin (CRESTOR) 10 MG tablet Take 1 tablet (10 mg total) by mouth daily. 30 tablet 3  . SYNTHROID 25 MCG tablet Take 25 mcg by mouth daily.     . tamsulosin (FLOMAX) 0.4 MG CAPS capsule Take 0.4 mg by mouth every evening.     Marland Kitchen TOUJEO SOLOSTAR 300 UNIT/ML SOPN Inject 40 Units into the skin daily.   3   No current facility-administered medications on file prior to visit.     Cardiovascular studies:  Lexiscan Myoview stress test 08/05/2018: Lexiscan stress test was performed. Stress EKG is non-diagnostic, as this is pharmacological stress test. SPECT imaging reveals small sized, mild intensity, reversible perfusion defect in mid to basal inferior/inferolateral myocardium, suggesting possible LCx/PDA territory ischemia. LVEF 62%. Low risk study.   Echocardiogram 07/23/2018:  1. The left ventricle has normal systolic function, with an ejection fraction of 55-60%. The cavity size was normal. Left ventricular diastolic Doppler parameters are consistent with impaired relaxation.  2. The right ventricle has normal systolic  function. The cavity was normal. There is no increase in right ventricular wall thickness.  3. The aortic valve is tricuspid. Mild calcification of the aortic valve. Trace aortic stenosis. of the aortic valve.  4. No other significant valvular abnormalities seen.   EKG 07/17/2018: Sinus rhythm 78 bpm. Nonspecific ST-T changes.  EKG 07/14/2018: Sinus rhythm 71  bpm. Normal EKG  CT Chest 07/14/2018: 1. No evidence of pulmonary embolism, although evaluation of the segmental pulmonary arteries is limited due to motion artifact. 2. Diffuse mosaic attenuation of the lungs, nonspecific, but possibly reflecting small airways disease. 3.  Aortic atherosclerosis (ICD10-I70.0).  Recent labs: 02/11/2018: Glucose 95. BUN/Cr 15/1.0. eGFR 73. Na/K 138/4.9. Rest of the CMP normal. H/H 15/47. MCV 90. Platelets 278.  HbA1C 7.2%   Review of Systems  Constitution: Negative for decreased appetite, malaise/fatigue, weight gain and weight loss.  HENT: Negative for congestion.   Eyes: Negative for visual disturbance.  Cardiovascular: Positive for chest pain. Negative for dyspnea on exertion, leg swelling, palpitations and syncope.  Respiratory: Negative for cough.   Endocrine: Negative for cold intolerance.  Hematologic/Lymphatic: Does not bruise/bleed easily.  Skin: Negative for itching and rash.  Musculoskeletal: Negative for myalgias.  Gastrointestinal: Negative for abdominal pain, nausea and vomiting.  Genitourinary: Negative for dysuria.  Neurological: Negative for dizziness and weakness.  Psychiatric/Behavioral: The patient is not nervous/anxious.   All other systems reviewed and are negative.        There were no vitals filed for this visit.   There is no height or weight on file to calculate BMI. There were no vitals filed for this visit.   Objective:   Physical Exam  Constitutional: He is oriented to person, place, and time. He appears well-developed and well-nourished. No  distress.  Pulmonary/Chest: Effort normal.  Neurological: He is alert and oriented to person, place, and time.  Psychiatric: He has a normal mood and affect.  Nursing note and vitals reviewed.         Assessment & Recommendations:   73 y.o. Caucasian male with hypertension, hyperlipidemia, type 2 DM, OSA on CPAP, hypothyroidism, GERD, referred by Dr. Osborne Casco for evaluation of chest pain  Angina pectoris: Low risk stress test findings with possibility of one vessel disease, and normal EF. Symptoms have improved on anti anginal therapy. Increase metoprolol succinate to 50 mg for further improvement in symptoms. Continue isosorbide mononitrate 30 mg daily, along with as needed sublingual nitroglycerin.  Continue aspirin, statin.  Hypertension: Better controlled.   Type 2 diabetes mellitus without complication: Continue current management as per PCP.  I will see the patient back in 3 months. If he has any worsening symptoms, he knows to contact us immediately.   Nigel Mormon, MD [facility] Pager: [phone] Office: [phone] If no answer Cell [phone]

## 2018-08-06 DIAGNOSIS — I209 Angina pectoris, unspecified: Secondary | ICD-10-CM | POA: Diagnosis not present

## 2018-08-07 ENCOUNTER — Encounter: Payer: Self-pay | Admitting: Cardiology

## 2018-08-07 DIAGNOSIS — I209 Angina pectoris, unspecified: Secondary | ICD-10-CM | POA: Insufficient documentation

## 2018-08-07 DIAGNOSIS — Z794 Long term (current) use of insulin: Secondary | ICD-10-CM | POA: Insufficient documentation

## 2018-08-07 DIAGNOSIS — E113391 Type 2 diabetes mellitus with moderate nonproliferative diabetic retinopathy without macular edema, right eye: Secondary | ICD-10-CM | POA: Insufficient documentation

## 2018-08-07 DIAGNOSIS — I10 Essential (primary) hypertension: Secondary | ICD-10-CM | POA: Insufficient documentation

## 2018-08-07 DIAGNOSIS — E119 Type 2 diabetes mellitus without complications: Secondary | ICD-10-CM | POA: Insufficient documentation

## 2018-08-07 LAB — LIPID PANEL
Chol/HDL Ratio: 3.9 ratio (ref 0.0–5.0)
Cholesterol, Total: 146 mg/dL (ref 100–199)
HDL: 37 mg/dL — ABNORMAL LOW (ref >39)
LDL Calculated: 77 mg/dL (ref 0–99)
Triglycerides: 158 mg/dL — ABNORMAL HIGH (ref 0–149)
VLDL Cholesterol Cal: 32 mg/dL (ref 5–40)

## 2018-08-07 NOTE — Progress Notes (Signed)
Pt aware.

## 2018-08-13 DIAGNOSIS — Z794 Long term (current) use of insulin: Secondary | ICD-10-CM | POA: Diagnosis not present

## 2018-08-13 DIAGNOSIS — E7800 Pure hypercholesterolemia, unspecified: Secondary | ICD-10-CM | POA: Diagnosis not present

## 2018-08-13 DIAGNOSIS — I119 Hypertensive heart disease without heart failure: Secondary | ICD-10-CM | POA: Diagnosis not present

## 2018-08-13 DIAGNOSIS — E1165 Type 2 diabetes mellitus with hyperglycemia: Secondary | ICD-10-CM | POA: Diagnosis not present

## 2018-08-13 DIAGNOSIS — R0789 Other chest pain: Secondary | ICD-10-CM | POA: Diagnosis not present

## 2018-08-26 DIAGNOSIS — L570 Actinic keratosis: Secondary | ICD-10-CM | POA: Diagnosis not present

## 2018-08-26 DIAGNOSIS — L738 Other specified follicular disorders: Secondary | ICD-10-CM | POA: Diagnosis not present

## 2018-08-31 ENCOUNTER — Ambulatory Visit: Payer: HMO | Admitting: Family Medicine

## 2018-08-31 ENCOUNTER — Encounter: Payer: Self-pay | Admitting: Family Medicine

## 2018-08-31 ENCOUNTER — Other Ambulatory Visit: Payer: Self-pay

## 2018-08-31 VITALS — BP 132/74 | HR 78 | Temp 97.1°F | Ht 70.5 in | Wt 240.2 lb

## 2018-08-31 DIAGNOSIS — G629 Polyneuropathy, unspecified: Secondary | ICD-10-CM

## 2018-08-31 DIAGNOSIS — G4733 Obstructive sleep apnea (adult) (pediatric): Secondary | ICD-10-CM | POA: Diagnosis not present

## 2018-08-31 DIAGNOSIS — Z9989 Dependence on other enabling machines and devices: Secondary | ICD-10-CM

## 2018-08-31 NOTE — Patient Instructions (Signed)
Continue nightly use of CPAP and for greater than 4 hours each night  [facility]   Follow up with neurology in 1 year   Sleep Apnea Sleep apnea affects breathing during sleep. It causes breathing to stop for a short time or to become shallow. It can also increase the risk of:  Heart attack.  Stroke.  Being very overweight (obese).  Diabetes.  Heart failure.  Irregular heartbeat. The goal of treatment is to help you breathe normally again. What are the causes? There are three kinds of sleep apnea:  Obstructive sleep apnea. This is caused by a blocked or collapsed airway.  Central sleep apnea. This happens when the brain does not send the right signals to the muscles that control breathing.  Mixed sleep apnea. This is a combination of obstructive and central sleep apnea. The most common cause of this condition is a collapsed or blocked airway. This can happen if:  Your throat muscles are too relaxed.  Your tongue and tonsils are too large.  You are overweight.  Your airway is too small. What increases the risk?  Being overweight.  Smoking.  Having a small airway.  Being older.  Being male.  Drinking alcohol.  Taking medicines to calm yourself (sedatives or tranquilizers).  Having family members with the condition. What are the signs or symptoms?  Trouble staying asleep.  Being sleepy or tired during the day.  Getting angry a lot.  Loud snoring.  Headaches in the morning.  Not being able to focus your mind (concentrate).  Forgetting things.  Less interest in sex.  Mood swings.  Personality changes.  Feelings of sadness (depression).  Waking up a lot during the night to pee (urinate).  Dry mouth.  Sore throat. How is this diagnosed?  Your medical history.  A physical exam.  A test that is done when you are sleeping (sleep study). The test is most often done in a sleep lab but may also be done at  home. How is this treated?   Sleeping on your side.  Using a medicine to get rid of mucus in your nose (decongestant).  Avoiding the use of alcohol, medicines to help you relax, or certain pain medicines (narcotics).  Losing weight, if needed.  Changing your diet.  Not smoking.  Using a machine to open your airway while you sleep, such as: ? An oral appliance. This is a mouthpiece that shifts your lower jaw forward. ? A CPAP device. This device blows air through a mask when you breathe out (exhale). ? An EPAP device. This has valves that you put in each nostril. ? A BPAP device. This device blows air through a mask when you breathe in (inhale) and breathe out.  Having surgery if other treatments do not work. It is important to get treatment for sleep apnea. Without treatment, it can lead to:  High blood pressure.  Coronary artery disease.  In men, not being able to have an erection (impotence).  Reduced thinking ability. Follow these instructions at home: Lifestyle  Make changes that your doctor recommends.  Eat a healthy diet.  Lose weight if needed.  Avoid alcohol, medicines to help you relax, and some pain medicines.  Do not use any products that contain nicotine or tobacco, such as cigarettes, e-cigarettes, and chewing tobacco. If you need help quitting, ask your doctor. General instructions  Take over-the-counter and prescription medicines only as told by your doctor.  If you were  given a machine to use while you sleep, use it only as told by your doctor.  If you are having surgery, make sure to tell your doctor you have sleep apnea. You may need to bring your device with you.  Keep all follow-up visits as told by your doctor. This is important. Contact a doctor if:  The machine that you were given to use during sleep bothers you or does not seem to be working.  You do not get better.  You get worse. Get help right away if:  Your chest hurts.  You  have trouble breathing in enough air.  You have an uncomfortable feeling in your back, arms, or stomach.  You have trouble talking.  One side of your body feels weak.  A part of your face is hanging down. These symptoms may be an emergency. Do not wait to see if the symptoms will go away. Get medical help right away. Call your local emergency services (911 in the [place] Do not drive yourself to the hospital. Summary  This condition affects breathing during sleep.  The most common cause is a collapsed or blocked airway.  The goal of treatment is to help you breathe normally while you sleep. This information is not intended to replace advice given to you by your health care provider. Make sure you discuss any questions you have with your health care provider. Document Released: 10/17/2007 Document Revised: 10/24/2017 Document Reviewed: 09/02/2017 Elsevier Patient Education  2020 [employer].

## 2018-08-31 NOTE — Progress Notes (Signed)
PATIENT: Anthony [name] DOB: 06/19/1945  REASON FOR VISIT: follow up HISTORY FROM: patient  Chief Complaint  Patient presents with   Follow-up    Room 9, alone. CPAP OSA. "Works great"     HISTORY OF PRESENT ILLNESS: Today 08/31/18 Anthony [name] is a 73 y.o. male here today for follow up of OSA on CPAP.  He is doing very well on CPAP therapy.  He reports that he is using his machine every night.  He does note significant benefit in energy levels and feels that he rest better at night.  Compliance report dated 07/28/2018 through 8 2020 reveals that he is using CPAP therapy every night for compliance of 100%.  Every night he is using CPAP greater than 4 hours.  Average usage is 7 hours and 49 minutes.  AHI is 3.7 on 8 to 13 cm of water and an EPR of 2.  There is no significant leak.  He is following closely with the [facility] and primary care for work-up related to potential agent orange exposure.  He does note some neuropathies in his hands.  He is currently treated with gabapentin.  He is uncertain if this is helping.  HISTORY: (copied from Dr Dohmeier's note on 08/28/2017)  I have the pleasure of seeing Anthony [name] today, meanwhile 73 year old Caucasian left-handed gentleman with a history of obstructive sleep apnea and treated with CPAP.  The patient also has diabetic neuropathy and he is followed for many of his prescriptions and general primary needs by the [place].  He presents today on 28 August 2017 with his usual high compliance of 100% and average use at time of 7 hours 41 minutes with a residual AHI of 4.5.  His AutoSet machine allows for a window of pressure between 8 and 13 cmH2O was 2 cm expiratory pressure relief he does not have major air leaks, his AHI only peaked for about 3 out of the last 30 days and I am can only speculate what led to it.   He is using nose pillows which may interfere with the airflow as he has also nasal congestion / allergic rhinitis.  He endorsed the fatigue  severity score of 21 points and the Epworth sleepiness score at 9 points, he did not endorse depression. His neuropathy is of burning quality, his planta pedi are " on fire" - gabapentin at night only helps with that.    Date ? MM: Anthony [name] is a 73 year old male with a history of obstructive sleep apnea , RLS and peripheral neuropathy.  He returns today for an evaluation. His compliance download indicates that he uses machine 30 out of 30 days for compliance of 100%. He uses machine greater than 4 hours 30 out of 30 days for compliance of 100%. On average he uses his machine 8 hours and 17 minutes. His residual AHI is 2.9 on a minimum pressure of 8 cm of water and a maximum pressure of 13 cm of water with EPR of 2. The patient does not have a significant leak. Patient states that the increasing gabapentin was initially beneficial for his discomfort. He states that he only has significant discomfort at bedtime. He states that he has burning and tingling in the lower legs. He describes it as a hot poker feeling on the bottom of his feet. Denies any changes with his gait or balance. He is only taking Gabapentin 600 mg at bedtime. He returns today for an evaluation.  08-23-2014 Anthony [name]  is using CPAP at a 96 pressure percentile of 12 cm water in form of an AutoSet between 8 and 13 cm water with 2 cm water expiratory pressure relief. Residual AHI is 1.4 with 100% compliance average user time is 7 hours 42 minutes. He had trouble with [facility] billing services and has changed to our aero care. I would encourage him to try a new mask if he feels that it could be more comfortable, he has seen the dream wear mask in our office. Epworth sleepiness score is endorsed at 12 points, fatigue severity 34 points, geriatric depression score 10 out of 15 points. We will address this today as well. Since the patient still has burning feet at night but no longer restless legs on Neurontin 900 mg nightly I  suggested a lidocaine or ropivacaine cream. As for his clinical depression; his retirement is not what he hoped for. He supports still his 34 year old daughter and her 83 year old sons.    HISTORY 08/23/14: Anthony [name] is a 73 year old male with a history of obstructive sleep apnea and numbness and tingling in the lower extremities. He returns today for follow-up. The patient's CPAP download indicates that he uses machine 30 out of 30 days for compliance 100%. He uses machine greater than 4 hours 29 out of 30 days for compliance of 97%. On average he uses machine 7 hours and 2 minutes. His AHI is 3 with a minimum pressure of 8 cm of water and maximum pressure of 13 cm of water with EPR of 2. The patient does not have a significant leak. Overall patient feels that the CPAP has been beneficial however he states in the last 5-6 weeks he has not been sleeping as well. He states that he wakes up with his legs hurting and has been having night sweats. The patient has an appointment tomorrow with his primary care provider to talk about the night sweats. His Epworth score today is 16 and fatigue severity score is 39. He uses gabapentin 100 mg in the morning and at noon and 400 mg at bedtime. Initially this worked well for his symptoms. However he states that now he is waking up in the middle the night with his legs "aching.". He denies any new symptoms. Since the last visit the patient was diagnosed with pancreatitis and an enlarged liver. He returns today for an evaluation.   REVIEW OF SYSTEMS: Out of a complete 14 system review of symptoms, the patient complains only of the following symptoms, memory loss, headaches, weakness, tremor and all other reviewed systems are negative.  Fatigue severity scale: 41 Epworth sleepiness scale: 8   ALLERGIES: Allergies  Allergen Reactions   Carbidopa-Levodopa Other (See Comments)    Made skin crawl     HOME MEDICATIONS: Outpatient Medications Prior to Visit   Medication Sig Dispense Refill   allopurinol (ZYLOPRIM) 300 MG tablet Take 300 mg by mouth daily.      aspirin EC 81 MG tablet Take 1 tablet (81 mg total) by mouth daily. 30 tablet 2   colchicine 0.6 MG tablet Take 0.6 mg by mouth daily as needed (for gout).     empagliflozin (JARDIANCE) 10 MG TABS tablet Take 10 mg by mouth daily.      furosemide (LASIX) 40 MG tablet Take 40 mg by mouth daily.     gabapentin (NEURONTIN) 600 MG tablet Take 600 mg by mouth 2 (two) times daily. Takes in the morning and in the afternoon  gabapentin (NEURONTIN) 800 MG tablet Take 800 mg by mouth at bedtime.     isosorbide mononitrate (IMDUR) 30 MG 24 hr tablet Take 1 tablet (30 mg total) by mouth daily. 30 tablet 3   lisinopril (PRINIVIL,ZESTRIL) 40 MG tablet Take 1 tablet by mouth daily.      metFORMIN (GLUCOPHAGE) 500 MG tablet 500 mg 2 (two) times daily with a meal.      metoprolol succinate (TOPROL-XL) 25 MG 24 hr tablet Take 25 mg by mouth daily.     Multiple Vitamins-Minerals (PRESERVISION AREDS PO) Take 1 capsule by mouth 2 (two) times daily.     nitroGLYCERIN (NITROSTAT) 0.4 MG SL tablet Place 1 tablet (0.4 mg total) under the tongue every 5 (five) minutes as needed for chest pain. 30 tablet 3   pantoprazole (PROTONIX) 40 MG tablet Take 40 mg by mouth daily.      pyridOXINE (VITAMIN B-6) 100 MG tablet Take 100 mg by mouth daily.     rosuvastatin (CRESTOR) 10 MG tablet Take 1 tablet (10 mg total) by mouth daily. 30 tablet 3   SYNTHROID 25 MCG tablet Take 25 mcg by mouth daily.      tamsulosin (FLOMAX) 0.4 MG CAPS capsule Take 0.4 mg by mouth every evening.      TOUJEO SOLOSTAR 300 UNIT/ML SOPN Inject 40 Units into the skin daily.   3   gabapentin (NEURONTIN) 100 MG capsule TAKE 1 CAPSULE BY MOUTH IN THE MORNING,1 CAPSULE AT NOON AND 2 CAPSULES AT BEDTIME     No facility-administered medications prior to visit.     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   Obesity (BMI 30.0-34.9) 05/14/2013   OSA on CPAP    cpap setting of 10/ 13   Pancreatitis dx march 2016   Pneumonia 12 years ago    PAST SURGICAL HISTORY: Past Surgical History:  Procedure Laterality Date   BACK SURGERY  10/2009   Cervical, arterior   CARPAL TUNNEL RELEASE Left 2003   CARPAL TUNNEL RELEASE Bilateral    CATARACT EXTRACTION Left 01/2012   EUS N/A 07/15/2014   Procedure: FULL UPPER ENDOSCOPIC ULTRASOUND (EUS) RADIAL;  Surgeon: Carol Ada, MD;  Location: [facility];  Service: Endoscopy;  Laterality: N/A;   NASAL SINUS SURGERY  1981   RETINAL Left 06/2013   Retinal peel   SHOULDER SURGERY Right 2003    FAMILY HISTORY: Family History  Problem Relation Age of Onset   Colon cancer Father    Lung cancer Brother    Colon cancer Maternal Grandfather    Heart Problems Maternal Grandfather    Diabetes Maternal Grandmother     SOCIAL HISTORY: Social History   Socioeconomic History   Marital status: Married    Spouse name: [name]   Number of children: 2   Years of education: 12   Highest education level: Not on file  Occupational History   Occupation: [employer]    Employer: [employer]  Social Designer, fashion/clothing strain: Not on file   Food insecurity    Worry: Not on file    Inability: Not on file  Transportation needs    Medical: Not on file    Non-medical: Not on file  Tobacco Use   Smoking status: Never Smoker   Smokeless tobacco: Never Used  Substance and Sexual Activity   Alcohol use: No   Drug use: No   Sexual activity: Not on file  Lifestyle   Physical activity    Days per week: Not on file    Minutes per  session: Not on file   Stress: Not on file  Relationships   Social connections    Talks on phone: Not on file    Gets together: Not on file    Attends religious service: Not on file    Active member of club or organization: Not on file    Attends meetings of clubs or organizations: Not on file    Relationship status: Not on file   Intimate partner violence    Fear of current or ex partner: Not on file    Emotionally abused: Not on file    Physically abused: Not on file    Forced sexual activity: Not on file  Other Topics Concern   Not on file  Social History Narrative   Patient is married [name]) and lives at home with his wife.   Patient has two children (twins).   Patient is retired.   Patient has a high school education.   Patient does not drink any caffeine.   Patient is left-handed.      PHYSICAL EXAM  Vitals:   08/31/18 0914  BP: 132/74  Pulse: 78  Temp: (!) 97.1 F (36.2 C)  Weight: 240 lb 3.2 oz (109 kg)  Height: 5' 10.5" (1.791 m)   Body mass index is 33.98 kg/m.  Generalized: Well developed, in no acute distress  Cardiology: normal rate and rhythm, no murmur noted Respiratory: Clear to auscultation bilaterally  neck circ: 20.5" Neurological examination  Mentation: Alert oriented to time, place, history taking. Follows all commands speech and language fluent Cranial nerve II-XII: Pupils were equal round reactive to light. Extraocular movements were full, visual field were full on confrontational test. Facial sensation and strength were normal. Uvula tongue midline. Head turning and shoulder shrug  were normal and symmetric. Motor: The motor testing reveals 5 over 5 strength of all 4 extremities. Good symmetric motor tone is noted throughout.  Coordination: Cerebellar testing reveals good finger-nose-finger and heel-to-shin bilaterally.  Gait and station: Gait is normal.   DIAGNOSTIC DATA (LABS, IMAGING, TESTING) - I reviewed patient records, labs,  notes, testing and imaging myself where available.  No flowsheet data found.   Lab Results  Component Value Date   WBC 9.5 11/10/2009   HGB 15.6 03/17/2010   HCT 46.0 03/17/2010   MCV 86.1 11/10/2009   PLT 302 11/10/2009      Component Value Date/Time   NA 138 03/17/2010 1145   K 3.6 03/17/2010 1145   CL 103 03/17/2010 1145   CO2 26 11/10/2009 1554   GLUCOSE 103 (H) 03/17/2010 1145   BUN 13 03/17/2010 1145   CREATININE 0.9 03/17/2010 1145   CALCIUM 9.8 11/10/2009 1554   PROT 6.9 11/10/2009 1554   ALBUMIN 4.3 11/10/2009 1554   AST 27 11/10/2009 1554   ALT 31 11/10/2009 1554   ALKPHOS 66 11/10/2009 1554   BILITOT 0.4 11/10/2009 1554   GFRNONAA >60 11/10/2009 1554   GFRAA  11/10/2009 1554    >60        The eGFR has been calculated using the  MDRD equation. This calculation has not been validated in all clinical situations. eGFR's persistently <60 mL/min signify possible Chronic Kidney Disease.   Lab Results  Component Value Date   CHOL 146 08/06/2018   HDL 37 (L) 08/06/2018   LDLCALC 77 08/06/2018   TRIG 158 (H) 08/06/2018   CHOLHDL 3.9 08/06/2018   No results found for: HGBA1C No results found for: VITAMINB12 No results found for: TSH   ASSESSMENT AND PLAN 73 y.o. year old male  has a past medical history of BPH (benign prostatic hyperplasia), Diabetes mellitus type 2, controlled ([place]), Fatty liver, GERD (gastroesophageal reflux disease), Gout, HTN (hypertension), Hyperlipidemia, Hypothyroid, Nasal septal deformity (05/14/2013), Obesity (BMI 30.0-34.9) (05/14/2013), OSA on CPAP, Pancreatitis (dx march 2016), and Pneumonia (12 years ago). here with     ICD-10-CM   1. OSA on CPAP  G47.33 For home use only DME continuous positive airway pressure (CPAP)   Z99.89   2. Neuropathy  G62.9     Anthony [name] is doing very well from a CPAP perspective.  He is compliant with therapy and notes benefit.  He was encouraged to continue nightly and for greater than 4 hours each  night.  We have also discussed concerns of neuropathy.  He was encouraged to continue close follow-up with primary care and the [place].  Work-related will requested by either [facility] or [facility].  He will continue current therapy as prescribed by the [place].  He will follow-up with [place] in 1 year, sooner if needed.  He verbalizes understanding and agreement with this plan.   Orders Placed This Encounter  Procedures   For home use only DME continuous positive airway pressure (CPAP)    Supplies when needed    Order Specific Question:   Length of Need    Answer:   Lifetime    Order Specific Question:   Patient has OSA or probable OSA    Answer:   Yes    Order Specific Question:   Is the patient currently using CPAP in the home    Answer:   Yes    Order Specific Question:   Settings    Answer:   Other see comments    Order Specific Question:   CPAP supplies needed    Answer:   Mask, headgear, cushions, filters, heated tubing and water chamber     No orders of the defined types were placed in this encounter.     I spent 15 minutes with the patient. 50% of this time was spent counseling and educating patient on plan of care and medications.    Debbora Presto, FNP-C 08/31/2018, 10:15 AM [facility] [address], [place] [employer], [place] [address] [phone]

## 2018-09-04 ENCOUNTER — Other Ambulatory Visit: Payer: Self-pay | Admitting: Neurology

## 2018-09-14 DIAGNOSIS — M109 Gout, unspecified: Secondary | ICD-10-CM | POA: Diagnosis not present

## 2018-09-14 DIAGNOSIS — Z23 Encounter for immunization: Secondary | ICD-10-CM | POA: Diagnosis not present

## 2018-09-14 DIAGNOSIS — E1165 Type 2 diabetes mellitus with hyperglycemia: Secondary | ICD-10-CM | POA: Diagnosis not present

## 2018-09-14 DIAGNOSIS — Z125 Encounter for screening for malignant neoplasm of prostate: Secondary | ICD-10-CM | POA: Diagnosis not present

## 2018-09-14 DIAGNOSIS — E039 Hypothyroidism, unspecified: Secondary | ICD-10-CM | POA: Diagnosis not present

## 2018-09-15 DIAGNOSIS — I10 Essential (primary) hypertension: Secondary | ICD-10-CM | POA: Diagnosis not present

## 2018-09-18 ENCOUNTER — Other Ambulatory Visit: Payer: Self-pay

## 2018-09-18 MED ORDER — METOPROLOL SUCCINATE ER 50 MG PO TB24: 50.0000 mg | ORAL_TABLET | Freq: Every day | ORAL | 0 refills | Status: DC

## 2018-09-20 DIAGNOSIS — Z1339 Encounter for screening examination for other mental health and behavioral disorders: Secondary | ICD-10-CM | POA: Diagnosis not present

## 2018-09-21 DIAGNOSIS — R609 Edema, unspecified: Secondary | ICD-10-CM | POA: Diagnosis not present

## 2018-09-21 DIAGNOSIS — I119 Hypertensive heart disease without heart failure: Secondary | ICD-10-CM | POA: Diagnosis not present

## 2018-09-21 DIAGNOSIS — E669 Obesity, unspecified: Secondary | ICD-10-CM | POA: Diagnosis not present

## 2018-09-21 DIAGNOSIS — H47011 Ischemic optic neuropathy, right eye: Secondary | ICD-10-CM | POA: Diagnosis not present

## 2018-09-21 DIAGNOSIS — G2581 Restless legs syndrome: Secondary | ICD-10-CM | POA: Diagnosis not present

## 2018-09-21 DIAGNOSIS — E1165 Type 2 diabetes mellitus with hyperglycemia: Secondary | ICD-10-CM | POA: Diagnosis not present

## 2018-09-21 DIAGNOSIS — E1140 Type 2 diabetes mellitus with diabetic neuropathy, unspecified: Secondary | ICD-10-CM | POA: Diagnosis not present

## 2018-09-21 DIAGNOSIS — Z0000 Encounter for general adult medical examination without abnormal findings: Secondary | ICD-10-CM | POA: Diagnosis not present

## 2018-09-21 DIAGNOSIS — D692 Other nonthrombocytopenic purpura: Secondary | ICD-10-CM | POA: Diagnosis not present

## 2018-09-21 DIAGNOSIS — C4492 Squamous cell carcinoma of skin, unspecified: Secondary | ICD-10-CM | POA: Diagnosis not present

## 2018-09-21 DIAGNOSIS — I7090 Unspecified atherosclerosis: Secondary | ICD-10-CM | POA: Diagnosis not present

## 2018-09-21 DIAGNOSIS — Z794 Long term (current) use of insulin: Secondary | ICD-10-CM | POA: Diagnosis not present

## 2018-09-22 ENCOUNTER — Other Ambulatory Visit: Payer: Self-pay

## 2018-09-22 NOTE — Patient Outreach (Signed)
  [facility]) Care Management Chronic Special Needs Program  09/22/2018  Name: Anthony [name] DOB: [birth year]  MRN: [id number]  [name] is enrolled in a chronic special needs plan for Diabetes. Reviewed and updated care plan.  Subjective: Client reports blood pressure controlled. He reports having issues with chest pain, but states he has seen cardiologist and since starting new medications has not had any more chest pain. Client reports blood sugar 96 this morning. He reports the lowest his blood sugar has been in the past month was 68 and he reports he treated with eating a meal. Client denies any issues or concerns at this time  Goals Addressed            This Visit's Progress   . COMPLETED: Client understands the importance of follow-up with providers by attending scheduled visits   On track    Voiced attendance at scheduled visits.    . COMPLETED: Client will report abillity to obtain Medications within the next 6 months.   On track    No issues reported.    . COMPLETED: Client will use Assistive Devices as needed and verbalize understanding of device use       Denies any questions about use of glucometer    . COMPLETED: Client will verbalize knowledge of self management of Hypertension as evidences by BP reading of 140/90 or less; or as defined by provider       Voiced taking medications, attending provider visits. Reports blood pressure readings 116/80    . COMPLETED: HEMOGLOBIN A1C < 7       A1C 6.6 on 09/14/2018    . Maintain timely refills of diabetic medication as prescribed within the year .   On track   . COMPLETED: Obtain annual  Lipid Profile, LDL-C       Done 09/14/2018    . COMPLETED: Obtain Annual Eye (retinal)  Exam        Done 02/03/2018    . COMPLETED: Obtain Annual Foot Exam       Done 03/18/2018    . COMPLETED: Obtain annual screen for micro albuminuria (urine) , nephropathy (kidney problems)       done 09/13/2018    . Obtain  Hemoglobin A1C at least 2 times per year   On track    09/14/2018 A1C 6.6     . COMPLETED: Visit Primary Care Provider or Endocrinologist at least 2 times per year        Office visits completed x2 this year.      COVID-19 precautions discussed. RNCM encouraged to call RNCM as needed. Also encouraged to call Healthcare concierge as needed for benefits questions.  Plan: RNCM will send updated care plan to client;  Send individual care plan to provider, send educational material Chronic care management coordinator will outreach within:  6 Months    Thea Silversmith, RN, MSN, [place] [phone]   .

## 2018-09-23 DIAGNOSIS — E119 Type 2 diabetes mellitus without complications: Secondary | ICD-10-CM | POA: Diagnosis not present

## 2018-09-23 DIAGNOSIS — R197 Diarrhea, unspecified: Secondary | ICD-10-CM | POA: Diagnosis not present

## 2018-10-08 ENCOUNTER — Other Ambulatory Visit: Payer: Self-pay | Admitting: Cardiology

## 2018-10-08 DIAGNOSIS — I209 Angina pectoris, unspecified: Secondary | ICD-10-CM

## 2018-10-11 ENCOUNTER — Other Ambulatory Visit: Payer: Self-pay | Admitting: Cardiology

## 2018-10-11 DIAGNOSIS — I209 Angina pectoris, unspecified: Secondary | ICD-10-CM

## 2018-10-13 ENCOUNTER — Other Ambulatory Visit: Payer: Self-pay | Admitting: Cardiology

## 2018-10-13 DIAGNOSIS — I209 Angina pectoris, unspecified: Secondary | ICD-10-CM

## 2018-10-14 DIAGNOSIS — Z1212 Encounter for screening for malignant neoplasm of rectum: Secondary | ICD-10-CM | POA: Diagnosis not present

## 2018-10-21 DIAGNOSIS — R197 Diarrhea, unspecified: Secondary | ICD-10-CM | POA: Diagnosis not present

## 2018-10-21 DIAGNOSIS — E119 Type 2 diabetes mellitus without complications: Secondary | ICD-10-CM | POA: Diagnosis not present

## 2018-11-09 ENCOUNTER — Encounter: Payer: Self-pay | Admitting: Cardiology

## 2018-11-09 ENCOUNTER — Ambulatory Visit (INDEPENDENT_AMBULATORY_CARE_PROVIDER_SITE_OTHER): Payer: HMO | Admitting: Cardiology

## 2018-11-09 ENCOUNTER — Other Ambulatory Visit: Payer: Self-pay

## 2018-11-09 VITALS — BP 140/82 | HR 73 | Temp 96.5°F | Ht 70.0 in | Wt 242.0 lb

## 2018-11-09 DIAGNOSIS — I10 Essential (primary) hypertension: Secondary | ICD-10-CM

## 2018-11-09 DIAGNOSIS — E119 Type 2 diabetes mellitus without complications: Secondary | ICD-10-CM

## 2018-11-09 DIAGNOSIS — I209 Angina pectoris, unspecified: Secondary | ICD-10-CM

## 2018-11-09 NOTE — Progress Notes (Signed)
Patient referred by Haywood Pao, MD for chest pain  Subjective:   Anthony [name], male    DOB: 1945-12-31, 73 y.o.   MRN: [id number]   Chief Complaint  Patient presents with  . Chest Pain  . Follow-up    3 month    HPI  73 y.o. Caucasian male with hypertension, hyperlipidemia, type 2 DM, OSA on CPAP, hypothyroidism, GERD, with exertional chest pain.   Stress test showed small sized, mild intensity, reversible perfusion defect in mid to basal inferior/inferolateral myocardium, suggesting possible LCx/PDA territory ischemia. LVEF 62%.  Patient was started on medical therapy with metoprolol succinate 50 mg daily, isosorbide mononitrate 30 mg daily, along with aspirin and statin.  Patient's angina symptoms have decreased from multiple times/day to 3-4 times/week to 1-2 times in last 3 months, on current antianginal therapy. Blood pressure is better controlled. He is walking at least 2 miles everyday.  Past Medical History:  Diagnosis Date  . BPH (benign prostatic hyperplasia)   . Diabetes mellitus type 2, controlled ([place])   . Fatty liver   . GERD (gastroesophageal reflux disease)   . Gout    last flare up last week right ankle   . HTN (hypertension)   . Hyperlipidemia   . Hypothyroid   . 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 Obesity (BMI 30.0-34.9) 05/14/2013  . OSA on CPAP    cpap setting of 10/ 13  . Pancreatitis dx march 2016  . Pneumonia 12 years ago     Past Surgical History:  Procedure Laterality Date  . BACK SURGERY  10/2009   Cervical, arterior  . CARPAL TUNNEL RELEASE Left 2003  . CARPAL TUNNEL RELEASE Bilateral   . CATARACT EXTRACTION Left 01/2012  . EUS N/A 07/15/2014   Procedure: FULL UPPER ENDOSCOPIC ULTRASOUND (EUS) RADIAL;  Surgeon: Carol Ada, MD;  Location: [facility];  Service: Endoscopy;  Laterality: N/A;  . NASAL SINUS SURGERY  1981  . RETINAL Left 06/2013   Retinal peel  . SHOULDER SURGERY Right 2003     Social History    Socioeconomic History  . Marital status: Married    Spouse name: [name]  . Number of children: 2  . Years of education: 41  . Highest education level: Not on file  Occupational History  . Occupation: [employer]    Employer: [employer]  Social Needs  . Financial resource strain: Not on file  . Food insecurity    Worry: Not on file    Inability: Not on file  . Transportation needs    Medical: Not on file    Non-medical: Not on file  Tobacco Use  . Smoking status: Never Smoker  . Smokeless tobacco: Never Used  Substance and Sexual Activity  . Alcohol use: No  . Drug use: No  . Sexual activity: Not on file  Lifestyle  . Physical activity    Days per week: Not on file    Minutes per session: Not on file  . Stress: Not on file  Relationships  . Social Herbalist on phone: Not on file    Gets together: Not on file    Attends religious service: Not on file    Active member of club or organization: Not on file    Attends meetings of clubs or organizations: Not on file    Relationship status: Not on file  . Intimate partner violence    Fear of current or ex partner: Not on file  Emotionally abused: Not on file    Physically abused: Not on file    Forced sexual activity: Not on file  Other Topics Concern  . Not on file  Social History Narrative   Patient is married [name]) and lives at home with his wife.   Patient has two children (twins).   Patient is retired.   Patient has a high school education.   Patient does not drink any caffeine.   Patient is left-handed.     Family History  Problem Relation Age of Onset  . Colon cancer Father   . Lung cancer Brother   . Colon cancer Maternal Grandfather   . Heart Problems Maternal Grandfather   . Diabetes Maternal Grandmother      Current Outpatient Medications on File Prior to Visit  Medication Sig Dispense Refill  . allopurinol (ZYLOPRIM) 300 MG tablet Take 300 mg by mouth daily.     Marland Kitchen aspirin  81 MG EC tablet TAKE 1 TABLET BY MOUTH EVERY DAY 90 tablet 0  . colchicine 0.6 MG tablet Take 0.6 mg by mouth daily as needed (for gout).    Marland Kitchen empagliflozin (JARDIANCE) 10 MG TABS tablet Take 10 mg by mouth daily.     . furosemide (LASIX) 40 MG tablet Take 40 mg by mouth daily.    Marland Kitchen gabapentin (NEURONTIN) 600 MG tablet Take 600 mg by mouth 2 (two) times daily. Takes in the morning and in the afternoon    . gabapentin (NEURONTIN) 800 MG tablet Take 800 mg by mouth at bedtime.    . isosorbide mononitrate (IMDUR) 30 MG 24 hr tablet TAKE 1 TABLET BY MOUTH EVERY DAY 90 tablet 1  . lisinopril (PRINIVIL,ZESTRIL) 40 MG tablet Take 1 tablet by mouth daily.     . metFORMIN (GLUCOPHAGE) 500 MG tablet 500 mg 2 (two) times daily with a meal.     . metoprolol succinate (TOPROL-XL) 50 MG 24 hr tablet Take 1 tablet (50 mg total) by mouth daily. Take with or immediately following a meal. 90 tablet 0  . Multiple Vitamins-Minerals (PRESERVISION AREDS PO) Take 1 capsule by mouth 2 (two) times daily.    . nitroGLYCERIN (NITROSTAT) 0.4 MG SL tablet Place 1 tablet (0.4 mg total) under the tongue every 5 (five) minutes as needed for chest pain. 30 tablet 3  . pantoprazole (PROTONIX) 40 MG tablet Take 40 mg by mouth daily.     Marland Kitchen pyridOXINE (VITAMIN B-6) 100 MG tablet Take 100 mg by mouth daily.    . rosuvastatin (CRESTOR) 10 MG tablet TAKE 1 TABLET BY MOUTH EVERY DAY 90 tablet 1  . SYNTHROID 25 MCG tablet Take 25 mcg by mouth daily.     . tamsulosin (FLOMAX) 0.4 MG CAPS capsule Take 0.4 mg by mouth every evening.     Marland Kitchen TOUJEO SOLOSTAR 300 UNIT/ML SOPN Inject 40 Units into the skin daily.   3   No current facility-administered medications on file prior to visit.     Cardiovascular studies:  Lexiscan Myoview stress test 08/05/2018: Lexiscan stress test was performed. Stress EKG is non-diagnostic, as this is pharmacological stress test. SPECT imaging reveals small sized, mild intensity, reversible perfusion defect in  mid to basal inferior/inferolateral myocardium, suggesting possible LCx/PDA territory ischemia. LVEF 62%. Low risk study.   Echocardiogram 07/23/2018:  1. The left ventricle has normal systolic function, with an ejection fraction of 55-60%. The cavity size was normal. Left ventricular diastolic Doppler parameters are consistent with impaired relaxation.  2. The  right ventricle has normal systolic function. The cavity was normal. There is no increase in right ventricular wall thickness.  3. The aortic valve is tricuspid. Mild calcification of the aortic valve. Trace aortic stenosis. of the aortic valve.  4. No other significant valvular abnormalities seen.   EKG 07/17/2018: Sinus rhythm 78 bpm. Nonspecific ST-T changes.  EKG 07/14/2018: Sinus rhythm 71 bpm. Normal EKG  CT Chest 07/14/2018: 1. No evidence of pulmonary embolism, although evaluation of the segmental pulmonary arteries is limited due to motion artifact. 2. Diffuse mosaic attenuation of the lungs, nonspecific, but possibly reflecting small airways disease. 3.  Aortic atherosclerosis (ICD10-I70.0).  Recent labs: 02/11/2018: Glucose 95. BUN/Cr 15/1.0. eGFR 73. Na/K 138/4.9. Rest of the CMP normal. H/H 15/47. MCV 90. Platelets 278.  HbA1C 7.2%   Review of Systems  Constitution: Negative for decreased appetite, malaise/fatigue, weight gain and weight loss.  HENT: Negative for congestion.   Eyes: Negative for visual disturbance.  Cardiovascular: Positive for chest pain. Negative for dyspnea on exertion, leg swelling, palpitations and syncope.  Respiratory: Negative for cough.   Endocrine: Negative for cold intolerance.  Hematologic/Lymphatic: Does not bruise/bleed easily.  Skin: Negative for itching and rash.  Musculoskeletal: Negative for myalgias.  Gastrointestinal: Negative for abdominal pain, nausea and vomiting.  Genitourinary: Negative for dysuria.  Neurological: Negative for dizziness and weakness.   Psychiatric/Behavioral: The patient is not nervous/anxious.   All other systems reviewed and are negative.        Vitals:   11/09/18 0944  BP: 140/82  Pulse: 73  Temp: (!) 96.5 F (35.8 C)  SpO2: 96%     Body mass index is 34.72 kg/m. Filed Weights   11/09/18 0944  Weight: 242 lb (109.8 kg)     Objective:   Physical Exam  Constitutional: He is oriented to person, place, and time. He appears well-developed and well-nourished. No distress.  HENT:  Head: Normocephalic and atraumatic.  Eyes: Pupils are equal, round, and reactive to light. Conjunctivae are normal.  Neck: No JVD present.  Cardiovascular: Normal rate, regular rhythm and intact distal pulses.  Pulmonary/Chest: Effort normal and breath sounds normal. He has no wheezes. He has no rales.  Abdominal: Soft. Bowel sounds are normal. There is no rebound.  Musculoskeletal:        General: No edema.  Lymphadenopathy:    He has no cervical adenopathy.  Neurological: He is alert and oriented to person, place, and time. No cranial nerve deficit.  Skin: Skin is warm and dry.  Psychiatric: He has a normal mood and affect.  Nursing note and vitals reviewed.         Assessment & Recommendations:   73 y.o. Caucasian male with hypertension, hyperlipidemia, type 2 DM, OSA on CPAP, hypothyroidism, GERD, referred by Dr. Osborne Casco for evaluation of chest pain  Angina pectoris: Low risk stress test findings with possibility of one vessel disease, and normal EF. Symptoms have improved on anti anginal therapy.  Currently on medical therapy with aspirin, statin, metoprolol succinate 50 mg daily, isosorbide mononitrate 30 mg daily, along with as needed sublingual nitroglycerin.   Follow up in 6 months with repeat lipid panel/  Hypertension: Better controlled.   Type 2 diabetes mellitus without complication: Continue current management, including Jardiance.   I will see the patient back in 6 months. If he has any  worsening symptoms, he knows to contact us immediately.   Nigel Mormon, MD [facility] Pager: [phone] Office: [phone] If no answer Cell  [phone]

## 2018-11-11 DIAGNOSIS — Z794 Long term (current) use of insulin: Secondary | ICD-10-CM | POA: Diagnosis not present

## 2018-11-11 DIAGNOSIS — I119 Hypertensive heart disease without heart failure: Secondary | ICD-10-CM | POA: Diagnosis not present

## 2018-11-11 DIAGNOSIS — E1165 Type 2 diabetes mellitus with hyperglycemia: Secondary | ICD-10-CM | POA: Diagnosis not present

## 2018-11-20 DIAGNOSIS — H3581 Retinal edema: Secondary | ICD-10-CM | POA: Diagnosis not present

## 2018-11-20 DIAGNOSIS — H47011 Ischemic optic neuropathy, right eye: Secondary | ICD-10-CM | POA: Diagnosis not present

## 2018-11-20 DIAGNOSIS — H35371 Puckering of macula, right eye: Secondary | ICD-10-CM | POA: Diagnosis not present

## 2018-11-20 DIAGNOSIS — H26491 Other secondary cataract, right eye: Secondary | ICD-10-CM | POA: Diagnosis not present

## 2018-12-16 ENCOUNTER — Other Ambulatory Visit: Payer: Self-pay | Admitting: Cardiology

## 2018-12-21 DIAGNOSIS — H35371 Puckering of macula, right eye: Secondary | ICD-10-CM | POA: Diagnosis not present

## 2018-12-21 DIAGNOSIS — H3581 Retinal edema: Secondary | ICD-10-CM | POA: Diagnosis not present

## 2018-12-21 DIAGNOSIS — H26491 Other secondary cataract, right eye: Secondary | ICD-10-CM | POA: Diagnosis not present

## 2018-12-28 DIAGNOSIS — L738 Other specified follicular disorders: Secondary | ICD-10-CM | POA: Diagnosis not present

## 2018-12-28 DIAGNOSIS — L570 Actinic keratosis: Secondary | ICD-10-CM | POA: Diagnosis not present

## 2018-12-28 DIAGNOSIS — L819 Disorder of pigmentation, unspecified: Secondary | ICD-10-CM | POA: Diagnosis not present

## 2018-12-28 DIAGNOSIS — L280 Lichen simplex chronicus: Secondary | ICD-10-CM | POA: Diagnosis not present

## 2018-12-29 DIAGNOSIS — H35371 Puckering of macula, right eye: Secondary | ICD-10-CM | POA: Diagnosis not present

## 2019-01-07 ENCOUNTER — Other Ambulatory Visit: Payer: Self-pay | Admitting: Cardiology

## 2019-01-07 DIAGNOSIS — I209 Angina pectoris, unspecified: Secondary | ICD-10-CM

## 2019-01-19 DIAGNOSIS — H35373 Puckering of macula, bilateral: Secondary | ICD-10-CM | POA: Diagnosis not present

## 2019-01-19 DIAGNOSIS — H3581 Retinal edema: Secondary | ICD-10-CM | POA: Diagnosis not present

## 2019-02-11 DIAGNOSIS — Z794 Long term (current) use of insulin: Secondary | ICD-10-CM | POA: Diagnosis not present

## 2019-02-11 DIAGNOSIS — E1165 Type 2 diabetes mellitus with hyperglycemia: Secondary | ICD-10-CM | POA: Diagnosis not present

## 2019-02-11 DIAGNOSIS — I119 Hypertensive heart disease without heart failure: Secondary | ICD-10-CM | POA: Diagnosis not present

## 2019-03-09 ENCOUNTER — Other Ambulatory Visit: Payer: Self-pay | Admitting: Cardiology

## 2019-03-18 ENCOUNTER — Other Ambulatory Visit: Payer: Self-pay

## 2019-03-18 NOTE — Patient Outreach (Signed)
[facility]) Care Management Chronic Special Needs Program  03/18/2019  Name: Anthony [name] DOB: May 21, 1945  MRN: [id number]  [name] is enrolled in a chronic special needs plan for Diabetes. Chronic Care Management Coordinator telephoned client to review health risk assessment and to develop individualized care plan.  RNCM reviewed the chronic care management program, importance of client participation, and taking their care plan to all provider appointments and inpatient facilities.    Subjective: client reports history of diabetes, heart disease and hypertension. Client states, "I am doing real well" when asked about diabetes management. Last A1C 6.6 on 02/11/19. Client reports he attends provider visits as scheduled and is able to obtain his medications without difficulty. Client denies any questions about medications, but is receptive to pharmacist reviewing medications due to polypharmacy. Client denies being overwhelmed regarding management of his medical condition. Client does however reports that staying in du to Covid-19 restrictions makes him feel "shut in".   Goals Addressed            This Visit's Progress   . Client will report no worsening of symptoms related to heart disease within the next 6 months   On track    Take your medications as recommended It is important to follow up with your doctor as scheduled for recommended lab/procedures. Mailed educational material: "heart disease in diabetics". Please read and call if you have any questions.     . Client will verbalize knowledge of self management of Hypertension as evidences by BP reading of 140/90 or less; or as defined by provider   On track    Follow up with your doctor as scheduled. Ask your doctor "what is my target blood pressure range". Monitor your blood pressure and take results to your doctor's appointment.  Continue to take your medications as recommended by your provider. Mailed  educational material: "High blood pressure in adults". Please review and call if you have any questions.    Marland Kitchen HEMOGLOBIN A1C < 7       A1C 6.6 on 01/22/2019 Diabetes self management actions:  Glucose monitoring per provider recommendations  Eat Healthy  Check feet daily  Visit provider every 3-6 months as directed  Hbg A1C level every 3-6 months.  Eye Exam yearly  Ask your doctor, "what is my Target A1C?"  Ask your doctor,"what is my Target A1C?"    . COMPLETED: Maintain timely refills of diabetic medication as prescribed within the year .       Denies any difficulty obtaining medications.    . Obtain annual  Lipid Profile, LDL-C   On track    It is important to follow up with your provider as scheduled for recommended labs/procedures.    . Obtain Annual Eye (retinal)  Exam    On track    It is important to follow up with your provider as scheduled for recommended exam.    . Obtain Annual Foot Exam   On track    It is important to follow up with your provider as scheduled for recommended exam.    . COMPLETED: Obtain annual screen for micro albuminuria (urine) , nephropathy (kidney problems)       It is important to follow up with your provider for recommended labs/procedures. This test can reflect how your kidneys are working.    . Obtain Hemoglobin A1C at least 2 times per year   On track    Done 02/11/2018; 08/13/2018 and 09/14/2018 A1C 6.6 02/11/2019 A1C  6.6  It is important to follow up with your provider as scheduled to complete recommended labs.      . Visit Primary Care Provider or Endocrinologist at least 2 times per year    On track    It is important to follow up with your provider as scheduled for recommended labs/procedures.      PHQ2= 2 and PHQ 9= 7. Client declines social work referral and states he does not need any counseling. Client states due more to being in from Covid restrictions. Client reports he will discuss with provider if he needs to. RNCM  provided [facility] help line contact number as resource and encouraged client to use as needed.  Covid-19 precautions discussed. Encouraged client to continue to wear mask, stay at least 6 feet distance when out in public and handwashing. Client reports he received his first Covid vaccine yesterday and has his second dose scheduled for next month.  RNCM encouraged client to call the 24 hour nurse advice line as needed; call the health care concierge for benefits questions and call RNCM for CM needs, with questions or as needed.  Plan: RNCM will send updated care plan to client; send updated care plan to primary care provider. Send Neurosurgeon. RNCM will outreach per tier level in 6 months.    Thea Silversmith, RN, MSN, [place] [phone]

## 2019-03-22 DIAGNOSIS — F4310 Post-traumatic stress disorder, unspecified: Secondary | ICD-10-CM | POA: Diagnosis not present

## 2019-03-22 DIAGNOSIS — E669 Obesity, unspecified: Secondary | ICD-10-CM | POA: Diagnosis not present

## 2019-03-22 DIAGNOSIS — M109 Gout, unspecified: Secondary | ICD-10-CM | POA: Diagnosis not present

## 2019-03-22 DIAGNOSIS — G4733 Obstructive sleep apnea (adult) (pediatric): Secondary | ICD-10-CM | POA: Diagnosis not present

## 2019-03-22 DIAGNOSIS — I7090 Unspecified atherosclerosis: Secondary | ICD-10-CM | POA: Diagnosis not present

## 2019-03-22 DIAGNOSIS — D692 Other nonthrombocytopenic purpura: Secondary | ICD-10-CM | POA: Diagnosis not present

## 2019-03-22 DIAGNOSIS — I119 Hypertensive heart disease without heart failure: Secondary | ICD-10-CM | POA: Diagnosis not present

## 2019-03-22 DIAGNOSIS — E1165 Type 2 diabetes mellitus with hyperglycemia: Secondary | ICD-10-CM | POA: Diagnosis not present

## 2019-03-22 DIAGNOSIS — Z1331 Encounter for screening for depression: Secondary | ICD-10-CM | POA: Diagnosis not present

## 2019-03-22 DIAGNOSIS — E7800 Pure hypercholesterolemia, unspecified: Secondary | ICD-10-CM | POA: Diagnosis not present

## 2019-03-22 DIAGNOSIS — E039 Hypothyroidism, unspecified: Secondary | ICD-10-CM | POA: Diagnosis not present

## 2019-03-22 DIAGNOSIS — G2581 Restless legs syndrome: Secondary | ICD-10-CM | POA: Diagnosis not present

## 2019-03-22 DIAGNOSIS — E1140 Type 2 diabetes mellitus with diabetic neuropathy, unspecified: Secondary | ICD-10-CM | POA: Diagnosis not present

## 2019-04-01 ENCOUNTER — Other Ambulatory Visit: Payer: Self-pay | Admitting: Cardiology

## 2019-04-01 DIAGNOSIS — I209 Angina pectoris, unspecified: Secondary | ICD-10-CM

## 2019-04-02 ENCOUNTER — Other Ambulatory Visit: Payer: Self-pay | Admitting: Cardiology

## 2019-04-02 DIAGNOSIS — I209 Angina pectoris, unspecified: Secondary | ICD-10-CM

## 2019-04-26 DIAGNOSIS — I209 Angina pectoris, unspecified: Secondary | ICD-10-CM | POA: Diagnosis not present

## 2019-04-27 LAB — LIPID PANEL
Chol/HDL Ratio: 4 ratio (ref 0.0–5.0)
Cholesterol, Total: 147 mg/dL (ref 100–199)
HDL: 37 mg/dL — ABNORMAL LOW (ref >39)
LDL Chol Calc (NIH): 84 mg/dL (ref 0–99)
Triglycerides: 149 mg/dL (ref 0–149)
VLDL Cholesterol Cal: 26 mg/dL (ref 5–40)

## 2019-05-09 NOTE — Progress Notes (Signed)
4/19 rescheduled to 4/20.

## 2019-05-10 ENCOUNTER — Ambulatory Visit: Payer: HMO | Admitting: Cardiology

## 2019-05-11 ENCOUNTER — Other Ambulatory Visit: Payer: Self-pay

## 2019-05-11 ENCOUNTER — Encounter: Payer: Self-pay | Admitting: Cardiology

## 2019-05-11 ENCOUNTER — Ambulatory Visit: Payer: HMO | Admitting: Cardiology

## 2019-05-11 VITALS — BP 121/71 | HR 93 | Temp 97.0°F | Resp 18 | Ht 70.0 in | Wt 238.0 lb

## 2019-05-11 DIAGNOSIS — I10 Essential (primary) hypertension: Secondary | ICD-10-CM | POA: Diagnosis not present

## 2019-05-11 DIAGNOSIS — I209 Angina pectoris, unspecified: Secondary | ICD-10-CM | POA: Diagnosis not present

## 2019-05-11 DIAGNOSIS — E782 Mixed hyperlipidemia: Secondary | ICD-10-CM | POA: Diagnosis not present

## 2019-05-11 DIAGNOSIS — R9439 Abnormal result of other cardiovascular function study: Secondary | ICD-10-CM | POA: Diagnosis not present

## 2019-05-11 MED ORDER — ISOSORBIDE MONONITRATE ER 60 MG PO TB24: 60.0000 mg | ORAL_TABLET | Freq: Every day | ORAL | 2 refills | Status: DC

## 2019-05-11 MED ORDER — ROSUVASTATIN CALCIUM 20 MG PO TABS: 20.0000 mg | ORAL_TABLET | Freq: Every day | ORAL | 3 refills | Status: DC

## 2019-05-11 NOTE — Progress Notes (Signed)
Patient referred by Haywood Pao, MD for chest pain  Subjective:   Anthony [name], male    DOB: [birth year], 74 y.o.   MRN: [id number]   Chief Complaint  Patient presents with  . Hyperlipidemia  . Chest Pain  . Follow-up    lab results     74 y.o. Caucasian male with hypertension, hyperlipidemia, type 2 DM, OSA on CPAP, hypothyroidism, GERD, followed for stable angina  Stress test showed small sized, mild intensity, reversible perfusion defect in mid to basal inferior/inferolateral myocardium, suggesting possible LCx/PDA territory ischemia. LVEF 62%.  Patient was started on medical therapy with metoprolol succinate 50 mg daily, isosorbide mononitrate 30 mg daily, along with aspirin and statin.  Patient is here for follow up. While his chest pain improvedsymptoms have worsened in frequency and severity, lasting up to 30 min, relived with rest.    Current Outpatient Medications on File Prior to Visit  Medication Sig Dispense Refill  . allopurinol (ZYLOPRIM) 300 MG tablet Take 300 mg by mouth daily.     Marland Kitchen aspirin 81 MG EC tablet TAKE 1 TABLET BY MOUTH EVERY DAY 90 tablet 0  . colchicine 0.6 MG tablet Take 0.6 mg by mouth daily as needed (for gout).    Marland Kitchen empagliflozin (JARDIANCE) 10 MG TABS tablet Take 10 mg by mouth daily.     . furosemide (LASIX) 40 MG tablet Take 40 mg by mouth daily.    Marland Kitchen gabapentin (NEURONTIN) 600 MG tablet Take 800 mg by mouth 3 (three) times daily. Takes in the morning and in the afternoon     . glipiZIDE (GLUCOTROL) 5 MG tablet Take 1 tablet by mouth daily.    . INVOKANA 100 MG TABS tablet 100 mg daily as needed.     . isosorbide mononitrate (IMDUR) 30 MG 24 hr tablet TAKE 1 TABLET BY MOUTH EVERY DAY 90 tablet 1  . ketoconazole (NIZORAL) 2 % cream APPLY TO AFFECTED AREA TWICE A DAY    . lisinopril (PRINIVIL,ZESTRIL) 40 MG tablet Take 1 tablet by mouth daily.     . metFORMIN (GLUCOPHAGE) 500 MG tablet 500 mg 2 (two) times daily with a meal.     .  metoprolol succinate (TOPROL-XL) 50 MG 24 hr tablet TAKE 1 TABLET (50 MG TOTAL) BY MOUTH DAILY. TAKE WITH OR IMMEDIATELY FOLLOWING A MEAL. 90 tablet 3  . metroNIDAZOLE (METROGEL) 0.75 % gel metronidazole 0.75 % topical gel    . Multiple Vitamins-Minerals (PRESERVISION AREDS PO) Take 1 capsule by mouth 2 (two) times daily.    . nitroGLYCERIN (NITROSTAT) 0.4 MG SL tablet Place 1 tablet (0.4 mg total) under the tongue every 5 (five) minutes as needed for chest pain. 30 tablet 3  . pantoprazole (PROTONIX) 40 MG tablet Take 40 mg by mouth daily.     Marland Kitchen pyridOXINE (VITAMIN B-6) 100 MG tablet Take 100 mg by mouth daily.    . rosuvastatin (CRESTOR) 10 MG tablet TAKE 1 TABLET BY MOUTH EVERY DAY 90 tablet 1  . SYNTHROID 25 MCG tablet Take 25 mcg by mouth daily.     . tamsulosin (FLOMAX) 0.4 MG CAPS capsule Take 0.4 mg by mouth every evening.     Marland Kitchen TOUJEO SOLOSTAR 300 UNIT/ML SOPN Inject 40 Units into the skin daily.   3   No current facility-administered medications on file prior to visit.    Cardiovascular studies:  EKG 05/11/2019: Sinus rhythm 92 bpm. Normal EKG.  Lexiscan Myoview stress test 08/05/2018: Carlton Adam stress  test was performed. Stress EKG is non-diagnostic, as this is pharmacological stress test. SPECT imaging reveals small sized, mild intensity, reversible perfusion defect in mid to basal inferior/inferolateral myocardium, suggesting possible LCx/PDA territory ischemia. LVEF 62%. Low risk study.   Echocardiogram 07/23/2018:  1. The left ventricle has normal systolic function, with an ejection fraction of 55-60%. The cavity size was normal. Left ventricular diastolic Doppler parameters are consistent with impaired relaxation.  2. The right ventricle has normal systolic function. The cavity was normal. There is no increase in right ventricular wall thickness.  3. The aortic valve is tricuspid. Mild calcification of the aortic valve. Trace aortic stenosis. of the aortic valve.  4. No  other significant valvular abnormalities seen.  CT Chest 07/14/2018: 1. No evidence of pulmonary embolism, although evaluation of the segmental pulmonary arteries is limited due to motion artifact. 2. Diffuse mosaic attenuation of the lungs, nonspecific, but possibly reflecting small airways disease. 3.  Aortic atherosclerosis (ICD10-I70.0).  Recent labs: 04/26/2019: Chol 147, TG 149, HDL 37, LDL 84  7/162020: Chol 146, TG 158, HDL 37, LDL 77   02/11/2018: Glucose 95. BUN/Cr 15/1.0. eGFR 73. Na/K 138/4.9. Rest of the CMP normal. H/H 15/47. MCV 90. Platelets 278.  HbA1C 7.2%   Review of Systems  Cardiovascular: Positive for chest pain. Negative for dyspnea on exertion, leg swelling, palpitations and syncope.         Vitals:   05/11/19 0939  BP: 121/71  Pulse: 93  Resp: 18  Temp: (!) 97 F (36.1 C)  SpO2: 95%     Body mass index is 34.15 kg/m. Filed Weights   05/11/19 0939  Weight: 238 lb (108 kg)     Objective:   Physical Exam  Constitutional: He appears well-developed and well-nourished.  Neck: No JVD present.  Cardiovascular: Normal rate, regular rhythm, normal heart sounds and intact distal pulses.  No murmur heard. Pulmonary/Chest: Effort normal and breath sounds normal. He has no wheezes. He has no rales.  Musculoskeletal:        General: No edema.  Nursing note and vitals reviewed.         Assessment & Recommendations:   74 y.o. Caucasian male with hypertension, hyperlipidemia, type 2 DM, OSA on CPAP, hypothyroidism, GERD, followed for stable angina  Angina pectoris: Increased severity and frequency of stable angina on optimal medical therapy.  Stress test in 07/2018 showing mild inferolateral ischemia.  Increase Imdur to 60 mg daily.  Increase Crestor to 20 mg daily. Continue Aspirin 81 mg, metoprolol succinate 50 mg daily, SL NTG as needed. Recommend coronary angiography and possible intervention. Will schedule for next week with Dr.  Einar Gip.  Schedule for cardiac catheterization, and possible angioplasty. We discussed regarding risks, benefits, alternatives to this including stress testing, CTA and continued medical therapy. Patient wants to proceed. Understands <1-2% risk of death, stroke, MI, urgent CABG, bleeding, infection, renal failure but not limited to these.   Low risk stress test findings with possibility of one vessel disease, and normal EF. Symptoms have improved on anti anginal therapy.  Currently on medical therapy with aspirin, statin, metoprolol succinate 50 mg daily, isosorbide mononitrate 30 mg daily, along with as needed sublingual nitroglycerin.   Hypertension: Well controlled.   Type 2 diabetes mellitus without complication: Continue current management, including Jardiance.   Nigel Mormon, MD [facility] Pager: [phone] Office: [phone] If no answer Cell [phone]

## 2019-05-11 NOTE — H&P (View-Only) (Signed)
Patient referred by Haywood Pao, MD for chest pain  Subjective:   Anthony [name], male    DOB: [birth year], 74 y.o.   MRN: [id number]   Chief Complaint  Patient presents with  . Hyperlipidemia  . Chest Pain  . Follow-up    lab results     74 y.o. Caucasian male with hypertension, hyperlipidemia, type 2 DM, OSA on CPAP, hypothyroidism, GERD, followed for stable angina  Stress test showed small sized, mild intensity, reversible perfusion defect in mid to basal inferior/inferolateral myocardium, suggesting possible LCx/PDA territory ischemia. LVEF 62%.  Patient was started on medical therapy with metoprolol succinate 50 mg daily, isosorbide mononitrate 30 mg daily, along with aspirin and statin.  Patient is here for follow up. While his chest pain improvedsymptoms have worsened in frequency and severity, lasting up to 30 min, relived with rest.    Current Outpatient Medications on File Prior to Visit  Medication Sig Dispense Refill  . allopurinol (ZYLOPRIM) 300 MG tablet Take 300 mg by mouth daily.     Marland Kitchen aspirin 81 MG EC tablet TAKE 1 TABLET BY MOUTH EVERY DAY 90 tablet 0  . colchicine 0.6 MG tablet Take 0.6 mg by mouth daily as needed (for gout).    Marland Kitchen empagliflozin (JARDIANCE) 10 MG TABS tablet Take 10 mg by mouth daily.     . furosemide (LASIX) 40 MG tablet Take 40 mg by mouth daily.    Marland Kitchen gabapentin (NEURONTIN) 600 MG tablet Take 800 mg by mouth 3 (three) times daily. Takes in the morning and in the afternoon     . glipiZIDE (GLUCOTROL) 5 MG tablet Take 1 tablet by mouth daily.    . INVOKANA 100 MG TABS tablet 100 mg daily as needed.     . isosorbide mononitrate (IMDUR) 30 MG 24 hr tablet TAKE 1 TABLET BY MOUTH EVERY DAY 90 tablet 1  . ketoconazole (NIZORAL) 2 % cream APPLY TO AFFECTED AREA TWICE A DAY    . lisinopril (PRINIVIL,ZESTRIL) 40 MG tablet Take 1 tablet by mouth daily.     . metFORMIN (GLUCOPHAGE) 500 MG tablet 500 mg 2 (two) times daily with a meal.     .  metoprolol succinate (TOPROL-XL) 50 MG 24 hr tablet TAKE 1 TABLET (50 MG TOTAL) BY MOUTH DAILY. TAKE WITH OR IMMEDIATELY FOLLOWING A MEAL. 90 tablet 3  . metroNIDAZOLE (METROGEL) 0.75 % gel metronidazole 0.75 % topical gel    . Multiple Vitamins-Minerals (PRESERVISION AREDS PO) Take 1 capsule by mouth 2 (two) times daily.    . nitroGLYCERIN (NITROSTAT) 0.4 MG SL tablet Place 1 tablet (0.4 mg total) under the tongue every 5 (five) minutes as needed for chest pain. 30 tablet 3  . pantoprazole (PROTONIX) 40 MG tablet Take 40 mg by mouth daily.     Marland Kitchen pyridOXINE (VITAMIN B-6) 100 MG tablet Take 100 mg by mouth daily.    . rosuvastatin (CRESTOR) 10 MG tablet TAKE 1 TABLET BY MOUTH EVERY DAY 90 tablet 1  . SYNTHROID 25 MCG tablet Take 25 mcg by mouth daily.     . tamsulosin (FLOMAX) 0.4 MG CAPS capsule Take 0.4 mg by mouth every evening.     Marland Kitchen TOUJEO SOLOSTAR 300 UNIT/ML SOPN Inject 40 Units into the skin daily.   3   No current facility-administered medications on file prior to visit.    Cardiovascular studies:  EKG 05/11/2019: Sinus rhythm 92 bpm. Normal EKG.  Lexiscan Myoview stress test 08/05/2018: Carlton Adam stress  test was performed. Stress EKG is non-diagnostic, as this is pharmacological stress test. SPECT imaging reveals small sized, mild intensity, reversible perfusion defect in mid to basal inferior/inferolateral myocardium, suggesting possible LCx/PDA territory ischemia. LVEF 62%. Low risk study.   Echocardiogram 07/23/2018:  1. The left ventricle has normal systolic function, with an ejection fraction of 55-60%. The cavity size was normal. Left ventricular diastolic Doppler parameters are consistent with impaired relaxation.  2. The right ventricle has normal systolic function. The cavity was normal. There is no increase in right ventricular wall thickness.  3. The aortic valve is tricuspid. Mild calcification of the aortic valve. Trace aortic stenosis. of the aortic valve.  4. No  other significant valvular abnormalities seen.  CT Chest 07/14/2018: 1. No evidence of pulmonary embolism, although evaluation of the segmental pulmonary arteries is limited due to motion artifact. 2. Diffuse mosaic attenuation of the lungs, nonspecific, but possibly reflecting small airways disease. 3.  Aortic atherosclerosis (ICD10-I70.0).  Recent labs: 04/26/2019: Chol 147, TG 149, HDL 37, LDL 84  7/162020: Chol 146, TG 158, HDL 37, LDL 77   02/11/2018: Glucose 95. BUN/Cr 15/1.0. eGFR 73. Na/K 138/4.9. Rest of the CMP normal. H/H 15/47. MCV 90. Platelets 278.  HbA1C 7.2%   Review of Systems  Cardiovascular: Positive for chest pain. Negative for dyspnea on exertion, leg swelling, palpitations and syncope.         Vitals:   05/11/19 0939  BP: 121/71  Pulse: 93  Resp: 18  Temp: (!) 97 F (36.1 C)  SpO2: 95%     Body mass index is 34.15 kg/m. Filed Weights   05/11/19 0939  Weight: 238 lb (108 kg)     Objective:   Physical Exam  Constitutional: He appears well-developed and well-nourished.  Neck: No JVD present.  Cardiovascular: Normal rate, regular rhythm, normal heart sounds and intact distal pulses.  No murmur heard. Pulmonary/Chest: Effort normal and breath sounds normal. He has no wheezes. He has no rales.  Musculoskeletal:        General: No edema.  Nursing note and vitals reviewed.         Assessment & Recommendations:   73 y.o. Caucasian male with hypertension, hyperlipidemia, type 2 DM, OSA on CPAP, hypothyroidism, GERD, followed for stable angina  Angina pectoris: Increased severity and frequency of stable angina on optimal medical therapy.  Stress test in 07/2018 showing mild inferolateral ischemia.  Increase Imdur to 60 mg daily.  Increase Crestor to 20 mg daily. Continue Aspirin 81 mg, metoprolol succinate 50 mg daily, SL NTG as needed. Recommend coronary angiography and possible intervention. Will schedule for next week with Dr.  Einar Gip.  Schedule for cardiac catheterization, and possible angioplasty. We discussed regarding risks, benefits, alternatives to this including stress testing, CTA and continued medical therapy. Patient wants to proceed. Understands <1-2% risk of death, stroke, MI, urgent CABG, bleeding, infection, renal failure but not limited to these.   Low risk stress test findings with possibility of one vessel disease, and normal EF. Symptoms have improved on anti anginal therapy.  Currently on medical therapy with aspirin, statin, metoprolol succinate 50 mg daily, isosorbide mononitrate 30 mg daily, along with as needed sublingual nitroglycerin.   Hypertension: Well controlled.   Type 2 diabetes mellitus without complication: Continue current management, including Jardiance.   Nigel Mormon, MD [facility] [place] Pager: [phone] Office: [phone] If no answer Cell [phone]

## 2019-05-14 ENCOUNTER — Other Ambulatory Visit (HOSPITAL_COMMUNITY)
Admission: RE | Admit: 2019-05-14 | Discharge: 2019-05-14 | Disposition: A | Discharge status: Home / Self Care | Payer: HMO | Source: Ambulatory Visit | Attending: Cardiology | Admitting: Cardiology

## 2019-05-14 DIAGNOSIS — Z20822 Contact with and (suspected) exposure to covid-19: Secondary | ICD-10-CM | POA: Diagnosis not present

## 2019-05-14 DIAGNOSIS — I10 Essential (primary) hypertension: Secondary | ICD-10-CM | POA: Diagnosis not present

## 2019-05-14 DIAGNOSIS — Z01812 Encounter for preprocedural laboratory examination: Secondary | ICD-10-CM | POA: Diagnosis not present

## 2019-05-14 LAB — SARS CORONAVIRUS 2 (TAT 6-24 HRS): SARS Coronavirus 2: NEGATIVE

## 2019-05-15 LAB — CBC
Hematocrit: 45.5 % (ref 37.5–51.0)
Hemoglobin: 14.9 g/dL (ref 13.0–17.7)
MCH: 28.1 pg (ref 26.6–33.0)
MCHC: 32.7 g/dL (ref 31.5–35.7)
MCV: 86 fL (ref 79–97)
Platelets: 388 10*3/uL (ref 150–450)
RBC: 5.3 x10E6/uL (ref 4.14–5.80)
RDW: 14.9 % (ref 11.6–15.4)
WBC: 8.9 10*3/uL (ref 3.4–10.8)

## 2019-05-18 ENCOUNTER — Ambulatory Visit (HOSPITAL_COMMUNITY)
Admission: RE | Admit: 2019-05-18 | Discharge: 2019-05-18 | Disposition: A | Discharge status: Home / Self Care | Payer: HMO | Attending: Cardiology | Admitting: Cardiology

## 2019-05-18 ENCOUNTER — Ambulatory Visit (HOSPITAL_COMMUNITY)
Admission: RE | Disposition: A | Discharge status: Home / Self Care | Payer: Self-pay | Source: Home / Self Care | Attending: Cardiology

## 2019-05-18 ENCOUNTER — Other Ambulatory Visit: Payer: Self-pay

## 2019-05-18 DIAGNOSIS — K219 Gastro-esophageal reflux disease without esophagitis: Secondary | ICD-10-CM | POA: Diagnosis not present

## 2019-05-18 DIAGNOSIS — G4733 Obstructive sleep apnea (adult) (pediatric): Secondary | ICD-10-CM | POA: Insufficient documentation

## 2019-05-18 DIAGNOSIS — Z7984 Long term (current) use of oral hypoglycemic drugs: Secondary | ICD-10-CM | POA: Diagnosis not present

## 2019-05-18 DIAGNOSIS — Z79899 Other long term (current) drug therapy: Secondary | ICD-10-CM | POA: Diagnosis not present

## 2019-05-18 DIAGNOSIS — E119 Type 2 diabetes mellitus without complications: Secondary | ICD-10-CM | POA: Insufficient documentation

## 2019-05-18 DIAGNOSIS — I2584 Coronary atherosclerosis due to calcified coronary lesion: Secondary | ICD-10-CM | POA: Insufficient documentation

## 2019-05-18 DIAGNOSIS — Z7982 Long term (current) use of aspirin: Secondary | ICD-10-CM | POA: Diagnosis not present

## 2019-05-18 DIAGNOSIS — I10 Essential (primary) hypertension: Secondary | ICD-10-CM | POA: Diagnosis not present

## 2019-05-18 DIAGNOSIS — I2510 Atherosclerotic heart disease of native coronary artery without angina pectoris: Secondary | ICD-10-CM | POA: Diagnosis not present

## 2019-05-18 DIAGNOSIS — E785 Hyperlipidemia, unspecified: Secondary | ICD-10-CM | POA: Insufficient documentation

## 2019-05-18 DIAGNOSIS — E039 Hypothyroidism, unspecified: Secondary | ICD-10-CM | POA: Insufficient documentation

## 2019-05-18 DIAGNOSIS — I209 Angina pectoris, unspecified: Secondary | ICD-10-CM | POA: Diagnosis present

## 2019-05-18 HISTORY — PX: LEFT HEART CATH AND CORONARY ANGIOGRAPHY: CATH118249

## 2019-05-18 LAB — BASIC METABOLIC PANEL
Anion gap: 10 (ref 5–15)
BUN: 17 mg/dL (ref 8–23)
CO2: 26 mmol/L (ref 22–32)
Calcium: 9.6 mg/dL (ref 8.9–10.3)
Chloride: 102 mmol/L (ref 98–111)
Creatinine, Ser: 1.17 mg/dL (ref 0.61–1.24)
GFR calc Af Amer: >60 mL/min (ref >60)
GFR calc non Af Amer: >60 mL/min (ref >60)
Glucose, Bld: 81 mg/dL (ref 70–99)
Potassium: 4.1 mmol/L (ref 3.5–5.1)
Sodium: 138 mmol/L (ref 135–145)

## 2019-05-18 LAB — GLUCOSE, CAPILLARY
Glucose-Capillary: 69 mg/dL — ABNORMAL LOW (ref 70–99)
Glucose-Capillary: 84 mg/dL (ref 70–99)
Glucose-Capillary: 68 mg/dL — ABNORMAL LOW (ref 70–99)
Glucose-Capillary: 105 mg/dL — ABNORMAL HIGH (ref 70–99)

## 2019-05-18 SURGERY — LEFT HEART CATH AND CORONARY ANGIOGRAPHY
Anesthesia: LOCAL

## 2019-05-18 MED ORDER — DEXTROSE 50 % IV SOLN
INTRAVENOUS | Status: DC | PRN
  Administered 2019-05-18: 25 mL via INTRAVENOUS

## 2019-05-18 MED ORDER — HEPARIN SODIUM (PORCINE) 1000 UNIT/ML IJ SOLN
INTRAMUSCULAR | Status: DC | PRN
  Administered 2019-05-18: 5000 [IU] via INTRAVENOUS

## 2019-05-18 MED ORDER — SODIUM CHLORIDE 0.9 % WEIGHT BASED INFUSION: 1.0000 mL/kg/h | INTRAVENOUS | Status: DC

## 2019-05-18 MED ORDER — METFORMIN HCL 500 MG PO TABS: 500.0000 mg | ORAL_TABLET | Freq: Two times a day (BID) | ORAL | Status: DC

## 2019-05-18 MED ORDER — SODIUM CHLORIDE 0.9% FLUSH: 3.0000 mL | INTRAVENOUS | Status: DC | PRN

## 2019-05-18 MED ORDER — MIDAZOLAM HCL 2 MG/2ML IJ SOLN
INTRAMUSCULAR | Status: DC | PRN
  Administered 2019-05-18: 2 mg via INTRAVENOUS

## 2019-05-18 MED ORDER — HEPARIN (PORCINE) IN NACL 1000-0.9 UT/500ML-% IV SOLN
INTRAVENOUS | Status: AC
  Filled 2019-05-18: qty 1000

## 2019-05-18 MED ORDER — DEXTROSE 50 % IV SOLN
12.5000 g | INTRAVENOUS | Status: AC
  Administered 2019-05-18: 12.5 g via INTRAVENOUS
  Filled 2019-05-18: qty 50

## 2019-05-18 MED ORDER — IOHEXOL 350 MG/ML SOLN
INTRAVENOUS | Status: DC | PRN
  Administered 2019-05-18: 50 mL via INTRA_ARTERIAL

## 2019-05-18 MED ORDER — SODIUM CHLORIDE 0.9% FLUSH: 3.0000 mL | Freq: Two times a day (BID) | INTRAVENOUS | Status: DC

## 2019-05-18 MED ORDER — VERAPAMIL HCL 2.5 MG/ML IV SOLN
INTRAVENOUS | Status: AC
  Filled 2019-05-18: qty 2

## 2019-05-18 MED ORDER — SODIUM CHLORIDE 0.9 % IV SOLN: 250.0000 mL | INTRAVENOUS | Status: DC | PRN

## 2019-05-18 MED ORDER — SODIUM CHLORIDE 0.9 % WEIGHT BASED INFUSION
3.0000 mL/kg/h | INTRAVENOUS | Status: AC
  Administered 2019-05-18: 3 mL/kg/h via INTRAVENOUS

## 2019-05-18 MED ORDER — VERAPAMIL HCL 2.5 MG/ML IV SOLN
INTRAVENOUS | Status: DC | PRN
  Administered 2019-05-18: 5 mL via INTRA_ARTERIAL

## 2019-05-18 MED ORDER — ASPIRIN 81 MG PO CHEW
81.0000 mg | CHEWABLE_TABLET | ORAL | Status: AC
  Administered 2019-05-18: 81 mg via ORAL
  Filled 2019-05-18: qty 1

## 2019-05-18 MED ORDER — NITROGLYCERIN 1 MG/10 ML FOR IR/CATH LAB
INTRA_ARTERIAL | Status: DC | PRN
  Administered 2019-05-18: 200 ug via INTRACORONARY
  Administered 2019-05-18: 100 ug via INTRACORONARY

## 2019-05-18 MED ORDER — LIDOCAINE HCL (PF) 1 % IJ SOLN
INTRAMUSCULAR | Status: AC
  Filled 2019-05-18: qty 30

## 2019-05-18 MED ORDER — AMLODIPINE BESYLATE 5 MG PO TABS: 5.0000 mg | ORAL_TABLET | Freq: Every day | ORAL | 2 refills | Status: DC

## 2019-05-18 MED ORDER — FENTANYL CITRATE (PF) 100 MCG/2ML IJ SOLN
INTRAMUSCULAR | Status: DC | PRN
  Administered 2019-05-18: 50 ug via INTRAVENOUS

## 2019-05-18 MED ORDER — FENTANYL CITRATE (PF) 100 MCG/2ML IJ SOLN
INTRAMUSCULAR | Status: AC
  Filled 2019-05-18: qty 2

## 2019-05-18 MED ORDER — NITROGLYCERIN 1 MG/10 ML FOR IR/CATH LAB
INTRA_ARTERIAL | Status: AC
  Filled 2019-05-18: qty 10

## 2019-05-18 MED ORDER — MIDAZOLAM HCL 2 MG/2ML IJ SOLN
INTRAMUSCULAR | Status: AC
  Filled 2019-05-18: qty 2

## 2019-05-18 SURGICAL SUPPLY — 9 items
CATH OPTITORQUE TIG 4.0 5F (CATHETERS) ×1 IMPLANT
DEVICE RAD COMP TR BAND LRG (VASCULAR PRODUCTS) ×1 IMPLANT
GLIDESHEATH SLEND A-KIT 6F 22G (SHEATH) ×1 IMPLANT
GUIDEWIRE INQWIRE 1.5J.035X260 (WIRE) IMPLANT
INQWIRE 1.5J .035X260CM (WIRE) ×2
KIT HEART LEFT (KITS) ×2 IMPLANT
PACK CARDIAC CATHETERIZATION (CUSTOM PROCEDURE TRAY) ×2 IMPLANT
TRANSDUCER W/STOPCOCK (MISCELLANEOUS) ×2 IMPLANT
TUBING CIL FLEX 10 FLL-RA (TUBING) ×2 IMPLANT

## 2019-05-18 NOTE — Progress Notes (Signed)
TR BAND REMOVAL  LOCATION:    right radial  DEFLATED PER PROTOCOL:    Yes.    TIME BAND OFF / DRESSING APPLIED:    1015   SITE UPON ARRIVAL:    Level 0  SITE AFTER BAND REMOVAL:    Level 0  CIRCULATION SENSATION AND MOVEMENT:    Within Normal Limits   Yes.    COMMENTS:   Gauze dsg applied.

## 2019-05-18 NOTE — Interval H&P Note (Signed)
History and Physical Interval Note:  05/18/2019 7:49 AM  Anthony [name]  has presented today for surgery, with the diagnosis of Chest Pain.  The various methods of treatment have been discussed with the patient and family. After consideration of risks, benefits and other options for treatment, the patient has consented to  Procedure(s): LEFT HEART CATH AND CORONARY ANGIOGRAPHY (N/A) and angioplasty as a surgical intervention.  The patient's history has been reviewed, patient examined, no change in status, stable for surgery.  I have reviewed the patient's chart and labs.  Questions were answered to the patient's satisfaction.   Symptom Status: Ischemic Symptoms Non-invasive Testing: Intermediate Risk If no or indeterminate stress test, FFR/iFR results in all diseased vessels: N/A Diabetes Mellitus: Yes S/P CABG: No Antianginal therapy (number of long-acting drugs): >=2 Patient undergoing renal transplant: No Patient undergoing percutaneous valve procedure: No   1 Vessel Disease No proximal LAD involvement, No proximal left dominant LCX involvement  PCI: A (8);  Indication 2  CABG: M (6);  Indication 2 Proximal left dominant LCX involvement  PCI: A (8);  Indication 5  CABG: A (8);  Indication 5 Proximal LAD involvement  PCI: A (8);  Indication 5  CABG: A (8);  Indication 5  2 Vessel Disease No proximal LAD involvement  PCI: A (8);  Indication 8  CABG: A (7);  Indication 8 Proximal LAD involvement  PCI: A (8);  Indication 14  CABG: A (9);  Indication 14  3 Vessel Disease Low disease complexity (e.g., focal stenoses, SYNTAX <=22)  PCI: A (7);  Indication 19  CABG: A (9);  Indication 19 Intermediate or high disease complexity (e.g., SYNTAX >=23)  PCI: M (6);  Indication 23  CABG: A (9);  Indication 23  Left Main Disease Isolated LMCA disease: ostial or midshaft  PCI: A (7);  Indication 24  CABG: A (9);  Indication 24 Isolated LMCA disease: bifurcation involvement  PCI: M  (6);  Indication 25  CABG: A (9);  Indication 25 LMCA ostial or midshaft, concurrent low disease burden multivessel disease (e.g., 1-2 additional focal stenoses, SYNTAX <=22)  PCI: A (7);  Indication 26  CABG: A (9);  Indication 26 LMCA ostial or midshaft, concurrent intermediate or high disease burden multivessel disease (e.g., 1-2 additional bifurcation stenoses, long stenoses, SYNTAX >=23)  PCI: M (4);  Indication 27  CABG: A (9);  Indication 27 LMCA bifurcation involvement, concurrent low disease burden multivessel disease (e.g., 1-2 additional focal stenoses, SYNTAX <=22)  PCI: M (6);  Indication 28  CABG: A (9);  Indication 28 LMCA bifurcation involvement, concurrent intermediate or high disease burden multivessel disease (e.g., 1-2 additional bifurcation stenoses, long stenoses, SYNTAX >=23)  PCI: R (3);  Indication 29  CABG: A (9);  Indication 29  Notes:  A indicates appropriate. M indicates may be appropriate. R indicates rarely appropriate. Number in parentheses is median score for that indication. Reclassify indicates number of functionally diseased vessels should be decreased given negative FFR/iFR. Re-evaluate the scenario interpreting any FFR/iFR negative vessel as being not significantly stenosed.  Disease means involved vessel provides flow to a sufficient amount of myocardium to be clinically important.  If FFR testing indicates a vessel is not significant, that vessel should not be considered diseased (and the patient should be reclassified with respect to extent of functionally significant disease).  Proximal LAD + proximal left dominant LCX is considered 3 vessel CAD  2 Vessel CAD with FFR/iFR abnormal in only 1 but not both is  considered 1 vessel CAD  Disease complexity includes occlusion, bifurcation, trifurcation, ostial, >20 mm, tortuosity, calcification, thrombus  LMCA disease is >=50% by angiography, MLD <2.8 mm, MLA <6 mm2; MLA 6-7.5 mm2 requires further  physiologic  See Table B for risk stratification based on noninvasive testing  Journal of the [employer] of Cardiology Mar 2017, 23391; DOI: 10.1016/j.jacc.2017.02.001 [employer].de.[email or web address].full-text.pdf This App  2018 by the Society for Cardiovascular Angiography and Interventions   Adrian Prows

## 2019-05-18 NOTE — Discharge Instructions (Signed)
DRINK PLENTY OF FLUIDS FOR THE NEXT 2-3 DAYS. ° °KEEP ARM ELEVATED THE REMAINDER OF THE DAY. ° °HOLD METFORMIN FOR A FULL 48 HOURS AFTER DISCHARGE. ° ° °Radial Site Care ° °This sheet gives you information about how to care for yourself after your procedure. Your health care provider may also give you more specific instructions. If you have problems or questions, contact your health care provider. °What can I expect after the procedure? °After the procedure, it is common to have: °· Bruising and tenderness at the catheter insertion area. °Follow these instructions at home: °Medicines °· Take over-the-counter and prescription medicines only as told by your health care provider. °Insertion site care °1. Follow instructions from your health care provider about how to take care of your insertion site. Make sure you: °? Wash your hands with soap and water before you change your bandage (dressing). If soap and water are not available, use hand sanitizer. °? Change your dressing as told by your health care provider. °2. Check your insertion site every day for signs of infection. Check for: °? Redness, swelling, or pain. °? Fluid or blood. °? Pus or a bad smell. °? Warmth. °3. Do not take baths, swim, or use a hot tub for 5 days. °4. You may shower 24-48 hours after the procedure. °? Remove the dressing and gently wash the site with plain soap and water. °? Pat the area dry with a clean towel. °? Do not rub the site. That could cause bleeding. °5. Do not apply powder or lotion to the site. °Activity ° °1. For 24 hours after the procedure, or as directed by your health care provider: °? Do not flex or bend the affected arm. °? Do not push or pull heavy objects with the affected arm. °? Do not drive yourself home from the hospital or clinic. You may drive 24 hours after the procedure. °? Do not operate machinery or power tools. °2. Do not push, pull or lift anything that is heavier than 10 lb for 5 days. °3. Ask your health  care provider when it is okay to: °? Return to work or school. °? Resume usual physical activities or sports. °? Resume sexual activity. °General instructions °· If the catheter site starts to bleed, raise your arm and put firm pressure on the site. If the bleeding does not stop, get help right away. This is a medical emergency. °· If you went home on the same day as your procedure, a responsible adult should be with you for the first 24 hours after you arrive home. °· Keep all follow-up visits as told by your health care provider. This is important. °Contact a health care provider if: °· You have a fever. °· You have redness, swelling, or yellow drainage around your insertion site. °Get help right away if: °· You have unusual pain at the radial site. °· The catheter insertion area swells very fast. °· The insertion area is bleeding, and the bleeding does not stop when you hold steady pressure on the area. °· Your arm or hand becomes pale, cool, tingly, or numb. °These symptoms may represent a serious problem that is an emergency. Do not wait to see if the symptoms will go away. Get medical help right away. Call your local emergency services (911 in the [place] Do not drive yourself to the hospital. °Summary °· After the procedure, it is common to have bruising and tenderness at the site. °· Follow instructions from your health care provider   about how to take care of your radial site wound. Check the wound every day for signs of infection. °· Do not push, pull or lift anything that is heavier than 10 lb for 5 days. ° °This information is not intended to replace advice given to you by your health care provider. Make sure you discuss any questions you have with your health care provider. °Document Revised: 02/12/2017 Document Reviewed: 02/12/2017 °Elsevier Patient Education © 2020 [employer] °

## 2019-05-19 ENCOUNTER — Telehealth: Payer: Self-pay

## 2019-05-19 MED FILL — Heparin Sod (Porcine)-NaCl IV Soln 1000 Unit/500ML-0.9%: INTRAVENOUS | Qty: 1000 | Status: AC

## 2019-05-19 MED FILL — Lidocaine HCl Local Preservative Free (PF) Inj 1%: INTRAMUSCULAR | Qty: 30 | Status: AC

## 2019-05-19 NOTE — Telephone Encounter (Signed)
Tylenol extra strength 2 tablets three times a day for a day or two as needed

## 2019-05-19 NOTE — Telephone Encounter (Signed)
Received a message from patient during after hours and patient stated that his R hand is swelling and hot. Patient would like to know what he can take to alleviate his symptoms. Thanks!

## 2019-05-28 NOTE — Progress Notes (Signed)
Patient referred by Haywood Pao, MD for chest pain  Subjective:   Anthony [name], male    DOB: Nov 10, 1945, 74 y.o.   MRN: [id number]   Chief Complaint  Patient presents with  . Chest Pain  . Follow-up    3-4 week   74 y.o. Caucasian male with hypertension, hyperlipidemia, mod nonobstructive CAD, type 2 DM, OSA on CPAP, hypothyroidism, GERD  Given patient's worsening angina symptoms and prior abnormal stress test, he underwent coronary angiography on 05/18/2019.  This showed moderate nonobstructive coronary artery disease with suspicion for endothelial dysfunction.  His angina symptoms have improved after starting amlodipine.   Current Outpatient Medications on File Prior to Visit  Medication Sig Dispense Refill  . allopurinol (ZYLOPRIM) 300 MG tablet Take 300 mg by mouth daily.     . Alogliptin Benzoate 25 MG TABS Take 25 mg by mouth daily.    Marland Kitchen amLODipine (NORVASC) 5 MG tablet Take 1 tablet (5 mg total) by mouth daily. 30 tablet 2  . aspirin 81 MG EC tablet TAKE 1 TABLET BY MOUTH EVERY DAY (Patient taking differently: Take 81 mg by mouth daily. ) 90 tablet 0  . colchicine 0.6 MG tablet Take 0.6 mg by mouth daily as needed (for gout).    Marland Kitchen divalproex (DEPAKOTE) 250 MG DR tablet Take 250 mg by mouth at bedtime.    . DULoxetine (CYMBALTA) 30 MG capsule Take 30 mg by mouth at bedtime.    . empagliflozin (JARDIANCE) 10 MG TABS tablet Take 10 mg by mouth daily.     . furosemide (LASIX) 40 MG tablet Take 40 mg by mouth daily.    Marland Kitchen gabapentin (NEURONTIN) 100 MG capsule TAKE 1 CAPSULE BY MOUTH IN THE MORNING,1 CAPSULE AT NOON AND 2 CAPSULES AT BEDTIME    . gabapentin (NEURONTIN) 800 MG tablet Take 800 mg by mouth 3 (three) times daily.     . isosorbide mononitrate (IMDUR) 60 MG 24 hr tablet Take 1 tablet (60 mg total) by mouth daily. 30 tablet 2  . ketoconazole (NIZORAL) 2 % cream Apply 1 application topically daily as needed for irritation (yeast irritation).     Marland Kitchen lisinopril  (PRINIVIL,ZESTRIL) 40 MG tablet Take 40 mg by mouth daily.     . metFORMIN (GLUCOPHAGE) 500 MG tablet Take 1 tablet (500 mg total) by mouth 2 (two) times daily with a meal.    . metoprolol succinate (TOPROL-XL) 50 MG 24 hr tablet TAKE 1 TABLET (50 MG TOTAL) BY MOUTH DAILY. TAKE WITH OR IMMEDIATELY FOLLOWING A MEAL. 90 tablet 3  . metroNIDAZOLE (METROGEL) 0.75 % gel Apply 1 application topically daily as needed (Skin rash on face).     Marland Kitchen OVER THE COUNTER MEDICATION Apply 1 application topically daily as needed (Leg cramps). Thera Wox    . oxybutynin (DITROPAN) 5 MG tablet Take 5 mg by mouth at bedtime.    . pantoprazole (PROTONIX) 40 MG tablet Take 40 mg by mouth daily.     . polyethylene glycol (MIRALAX / GLYCOLAX) 17 g packet Take 17 g by mouth daily as needed for mild constipation or moderate constipation.    Marland Kitchen pyridOXINE (VITAMIN B-6) 100 MG tablet Take 100 mg by mouth daily.    . rosuvastatin (CRESTOR) 20 MG tablet Take 1 tablet (20 mg total) by mouth daily. 30 tablet 3  . SYNTHROID 25 MCG tablet Take 25 mcg by mouth daily.     . tamsulosin (FLOMAX) 0.4 MG CAPS capsule Take 0.4  mg by mouth every evening.     Marland Kitchen TOUJEO SOLOSTAR 300 UNIT/ML SOPN Inject 42 Units into the skin at bedtime.   3  . nitroGLYCERIN (NITROSTAT) 0.4 MG SL tablet Place 1 tablet (0.4 mg total) under the tongue every 5 (five) minutes as needed for chest pain. 30 tablet 3  . Polyethyl Glycol-Propyl Glycol (SYSTANE) 0.4-0.3 % SOLN Place 1 drop into both eyes daily as needed (Dry eye).     No current facility-administered medications on file prior to visit.    Cardiovascular studies:  EKG 05/11/2019: Sinus rhythm 92 bpm. Normal EKG.  Coronary angiography 05/18/2019: Moderate diffuse ectasia involving specifically the LAD and also the right coronary artery with diffuse moderate amount of calcification, 50% stepdown post ectatic segment, slow flow which improved with intracoronary nitroglycerin to brisk flow. 50% mid ramus  intermediate disease again mild ectasia noted. Recommendation: Patient already on isosorbide mononitrate, I will add amlodipine 5 mg daily.  Weight loss, exercise to improve endothelial function and control of diabetes recommended.  50 mill contrast utilized.  Lexiscan Myoview stress test 08/05/2018: Lexiscan stress test was performed. Stress EKG is non-diagnostic, as this is pharmacological stress test. SPECT imaging reveals small sized, mild intensity, reversible perfusion defect in mid to basal inferior/inferolateral myocardium, suggesting possible LCx/PDA territory ischemia. LVEF 62%. Low risk study.   Echocardiogram 07/23/2018:  1. The left ventricle has normal systolic function, with an ejection fraction of 55-60%. The cavity size was normal. Left ventricular diastolic Doppler parameters are consistent with impaired relaxation.  2. The right ventricle has normal systolic function. The cavity was normal. There is no increase in right ventricular wall thickness.  3. The aortic valve is tricuspid. Mild calcification of the aortic valve. Trace aortic stenosis. of the aortic valve.  4. No other significant valvular abnormalities seen.  CT Chest 07/14/2018: 1. No evidence of pulmonary embolism, although evaluation of the segmental pulmonary arteries is limited due to motion artifact. 2. Diffuse mosaic attenuation of the lungs, nonspecific, but possibly reflecting small airways disease. 3.  Aortic atherosclerosis (ICD10-I70.0).  Recent labs:  05/18/2019:  BUN/Cr 17/1.17.  Na/K 138/4.1. Glucose 81. EGFR 60. Rest of the CMP normal H/H 14.9/. MCV 86. Platelets 388.  04/26/2019: Chol 147, TG 149, HDL 37, LDL 84  03/23/2019: A1C 6.700 % TSH 1.77   Review of Systems  Cardiovascular: Negative for chest pain, dyspnea on exertion, leg swelling, palpitations and syncope.         Vitals:   06/03/19 1031  BP: 107/64  Pulse: 67  Resp: 16  Temp: 97.8 F (36.6 C)  SpO2: 94%     Body  mass index is 34.06 kg/m. Filed Weights   06/03/19 1031  Weight: 237 lb 6.4 oz (107.7 kg)     Objective:   Physical Exam  Constitutional: He appears well-developed and well-nourished.  Neck: No JVD present.  Cardiovascular: Normal rate, regular rhythm, normal heart sounds and intact distal pulses.  No murmur heard. Pulmonary/Chest: Effort normal and breath sounds normal. He has no wheezes. He has no rales.  Musculoskeletal:        General: No edema.  Nursing note and vitals reviewed.         Assessment & Recommendations:   74 y.o. Caucasian male with hypertension, hyperlipidemia, mod nonobstructive CAD, type 2 DM, OSA on CPAP, hypothyroidism, GERD  CAD: Moderate nonobstructive disease with high clinical dysfunction.  Symptoms improved after adding amlodipine 5 mg daily. Continue aspirin, statin, rest of the antianginal therapy.  Hypertension: Well controlled.   Type 2 diabetes mellitus without complication: Continue current management, including Jardiance.  Follow-up in 6 months.  Nigel Mormon, MD [facility] [place] Pager: [phone] Office: [phone] If no answer Cell [phone]

## 2019-06-03 ENCOUNTER — Other Ambulatory Visit: Payer: Self-pay

## 2019-06-03 ENCOUNTER — Ambulatory Visit: Payer: HMO | Admitting: Cardiology

## 2019-06-03 ENCOUNTER — Encounter: Payer: Self-pay | Admitting: Cardiology

## 2019-06-03 VITALS — BP 107/64 | HR 67 | Temp 97.8°F | Resp 16 | Ht 70.0 in | Wt 237.4 lb

## 2019-06-03 DIAGNOSIS — I25118 Atherosclerotic heart disease of native coronary artery with other forms of angina pectoris: Secondary | ICD-10-CM | POA: Diagnosis not present

## 2019-06-03 DIAGNOSIS — E782 Mixed hyperlipidemia: Secondary | ICD-10-CM

## 2019-06-03 DIAGNOSIS — I10 Essential (primary) hypertension: Secondary | ICD-10-CM | POA: Diagnosis not present

## 2019-06-03 DIAGNOSIS — E119 Type 2 diabetes mellitus without complications: Secondary | ICD-10-CM

## 2019-07-06 DIAGNOSIS — L218 Other seborrheic dermatitis: Secondary | ICD-10-CM | POA: Diagnosis not present

## 2019-07-08 DIAGNOSIS — Z794 Long term (current) use of insulin: Secondary | ICD-10-CM | POA: Diagnosis not present

## 2019-07-08 DIAGNOSIS — E1165 Type 2 diabetes mellitus with hyperglycemia: Secondary | ICD-10-CM | POA: Diagnosis not present

## 2019-07-08 DIAGNOSIS — E669 Obesity, unspecified: Secondary | ICD-10-CM | POA: Diagnosis not present

## 2019-07-08 DIAGNOSIS — I119 Hypertensive heart disease without heart failure: Secondary | ICD-10-CM | POA: Diagnosis not present

## 2019-07-20 DIAGNOSIS — H3581 Retinal edema: Secondary | ICD-10-CM | POA: Diagnosis not present

## 2019-07-20 DIAGNOSIS — H35432 Paving stone degeneration of retina, left eye: Secondary | ICD-10-CM | POA: Diagnosis not present

## 2019-07-20 DIAGNOSIS — H47011 Ischemic optic neuropathy, right eye: Secondary | ICD-10-CM | POA: Diagnosis not present

## 2019-08-02 ENCOUNTER — Other Ambulatory Visit: Payer: Self-pay | Admitting: Cardiology

## 2019-08-02 DIAGNOSIS — I209 Angina pectoris, unspecified: Secondary | ICD-10-CM

## 2019-08-03 ENCOUNTER — Ambulatory Visit (HOSPITAL_COMMUNITY)
Admission: EM | Admit: 2019-08-03 | Discharge: 2019-08-03 | Disposition: A | Discharge status: Home / Self Care | Payer: HMO | Attending: Internal Medicine | Admitting: Internal Medicine

## 2019-08-03 ENCOUNTER — Other Ambulatory Visit: Payer: Self-pay

## 2019-08-03 ENCOUNTER — Encounter (HOSPITAL_COMMUNITY): Payer: Self-pay

## 2019-08-03 DIAGNOSIS — M5431 Sciatica, right side: Secondary | ICD-10-CM

## 2019-08-03 MED ORDER — PREDNISONE 50 MG PO TABS
ORAL_TABLET | ORAL | 0 refills | Status: DC
Start: 2019-08-03 — End: 2019-09-01

## 2019-08-03 NOTE — ED Provider Notes (Signed)
[place]    CSN: [id number] Arrival date & time: 08/03/19  1718      History   Chief Complaint Chief Complaint  Patient presents with  . Hip Pain    HPI Anthony KELNHOFER is a 74 y.o. male with PMH of nonobstructive CAD, hypertension, DM2, hyperlipidemia presents to urgent care with complaints of right buttock pain radiating down leg.  Patient states symptoms began when he was walking his dog several weeks ago when he felt a "twinge" in his back.  Patient reports subsequent pain in mid right gluteal region radiating down to hamstring.  Patient denies any numbness or tingling though reports he has fairly significant neuropathy.  No bowel or bladder incontinence.   Past Medical History:  Diagnosis Date  . BPH (benign prostatic hyperplasia)   . Diabetes mellitus type 2, controlled ([place])   . Fatty liver   . GERD (gastroesophageal reflux disease)   . Gout    last flare up last week right ankle   . HTN (hypertension)   . Hyperlipidemia   . Hypothyroid   . 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 Obesity (BMI 30.0-34.9) 05/14/2013  . OSA on CPAP    cpap setting of 10/ 13  . Pancreatitis dx march 2016  . Pneumonia 12 years ago    Patient Active Problem List   Diagnosis Date Noted  . Abnormal stress test 05/11/2019  . Mixed hyperlipidemia 05/11/2019  . Angina pectoris ([place]) 08/07/2018  . Essential hypertension 08/07/2018  . Type 2 diabetes mellitus without complication, without long-term current use of insulin ([place]) 08/07/2018  . Morbid obesity ([place]) 08/28/2017  . Neuropathy 08/28/2017  . Numbness and tingling of both legs below knees 11/10/2013  . OSA on CPAP 05/14/2013  . 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 Obesity (BMI 30.0-34.9) 05/14/2013    Past Surgical History:  Procedure Laterality Date  . BACK SURGERY  10/2009   Cervical, arterior  . CARPAL TUNNEL RELEASE Left 2003  . CARPAL TUNNEL RELEASE Bilateral   . CATARACT EXTRACTION Left 01/2012  . EUS N/A  07/15/2014   Procedure: FULL UPPER ENDOSCOPIC ULTRASOUND (EUS) RADIAL;  Surgeon: Carol Ada, MD;  Location: [facility];  Service: Endoscopy;  Laterality: N/A;  . EYE SURGERY    . LEFT HEART CATH AND CORONARY ANGIOGRAPHY N/A 05/18/2019   Procedure: LEFT HEART CATH AND CORONARY ANGIOGRAPHY;  Surgeon: Adrian Prows, MD;  Location: [facility];  Service: Cardiovascular;  Laterality: N/A;  . NASAL SINUS SURGERY  1981  . RETINAL Left 06/2013   Retinal peel  . SHOULDER SURGERY Right 2003       Home Medications    Prior to Admission medications   Medication Sig Start Date End Date Taking? Authorizing Provider  allopurinol (ZYLOPRIM) 300 MG tablet Take 300 mg by mouth daily.  05/11/13  Yes [provider]  Alogliptin Benzoate 25 MG TABS Take 25 mg by mouth daily.   Yes [provider]  amLODipine (NORVASC) 5 MG tablet Take 1 tablet (5 mg total) by mouth daily. 05/18/19 08/16/19 Yes Adrian Prows, MD  aspirin 81 MG EC tablet TAKE 1 TABLET BY MOUTH EVERY DAY Patient taking differently: Take 81 mg by mouth daily.  01/07/19  Yes Patwardhan, Manish J, MD  colchicine 0.6 MG tablet Take 0.6 mg by mouth daily as needed (for gout).   Yes [provider]  divalproex (DEPAKOTE) 250 MG DR tablet Take 250 mg by mouth at bedtime.   Yes [provider]  DULoxetine (CYMBALTA) 30 MG capsule Take 30 mg by mouth at bedtime.   Yes [provider]  empagliflozin (JARDIANCE) 10 MG TABS tablet Take 10 mg by mouth daily.    Yes [provider]  furosemide (LASIX) 40 MG tablet Take 40 mg by mouth daily.   Yes [provider]  gabapentin (NEURONTIN) 800 MG tablet Take 800 mg by mouth 3 (three) times daily.    Yes [provider]  isosorbide mononitrate (IMDUR) 60 MG 24 hr tablet TAKE 1 TABLET BY MOUTH EVERY DAY 08/02/19  Yes Patwardhan, Manish J, MD  lisinopril (PRINIVIL,ZESTRIL) 40 MG tablet Take 40 mg by mouth daily.  05/11/13  Yes [provider]  metoprolol succinate (TOPROL-XL) 50 MG 24 hr tablet TAKE 1 TABLET (50 MG TOTAL) BY MOUTH DAILY. TAKE WITH OR IMMEDIATELY FOLLOWING A MEAL. 03/09/19  Yes Patwardhan, Manish J, MD  nitroGLYCERIN (NITROSTAT) 0.4 MG SL tablet Place 1 tablet (0.4 mg total) under the tongue every 5 (five) minutes as needed for chest pain. 07/18/18 08/03/19 Yes Patwardhan, Manish J, MD  oxybutynin (DITROPAN) 5 MG tablet Take 5 mg by mouth at bedtime.   Yes [provider]  rosuvastatin (CRESTOR) 20 MG tablet Take 1 tablet (20 mg total) by mouth daily. 05/11/19 08/09/19 Yes Patwardhan, Manish J, MD  SYNTHROID 25 MCG tablet Take 25 mcg by mouth daily.  05/11/13  Yes [provider]  tamsulosin (FLOMAX) 0.4 MG CAPS capsule Take 0.4 mg by mouth every evening.  02/16/13  Yes [provider]  TOUJEO SOLOSTAR 300 UNIT/ML SOPN Inject 42 Units into the skin at bedtime.  07/31/17  Yes [provider]  gabapentin (NEURONTIN) 100 MG capsule TAKE 1 CAPSULE BY MOUTH IN THE MORNING,1 CAPSULE AT NOON AND 2 CAPSULES AT BEDTIME 05/14/19   [provider]  ketoconazole (NIZORAL) 2 % cream Apply 1 application topically daily as needed for irritation (yeast irritation).  03/02/19   [provider]  metFORMIN (GLUCOPHAGE) 500 MG tablet Take 1 tablet (500 mg total) by mouth 2 (two) times daily with a meal. 05/20/19   Adrian Prows, MD  metroNIDAZOLE (METROGEL) 0.75 % gel Apply 1 application topically daily as needed (Skin rash on face).     [provider]  OVER THE COUNTER MEDICATION Apply 1 application topically daily as needed (Leg cramps). Thera Wox    [provider]  pantoprazole (PROTONIX) 40 MG tablet Take 40 mg by mouth daily.  04/21/13   [provider]  Polyethyl Glycol-Propyl Glycol (SYSTANE) 0.4-0.3 % SOLN Place 1 drop into both eyes daily as needed (Dry eye).    [provider]  polyethylene glycol (MIRALAX / GLYCOLAX) 17 g packet Take 17 g by  mouth daily as needed for mild constipation or moderate constipation.    [provider]  predniSONE (DELTASONE) 50 MG tablet Take one tab daily x 7 days. 08/03/19   Rudolpho Sevin, NP  pyridOXINE (VITAMIN B-6) 100 MG tablet Take 100 mg by mouth daily.    [provider]    Family History Family History  Problem Relation Age of Onset  . Colon cancer Father   . Lung cancer Brother   . Colon cancer Maternal Grandfather   . Heart Problems Maternal Grandfather   . Diabetes Maternal Grandmother   . Diabetes Sister     Social History Social History   Tobacco Use  . Smoking status: Never Smoker  . Smokeless tobacco: Never Used  Vaping Use  . Vaping Use: Never used  Substance Use Topics  . Alcohol use: No  . Drug use: No     Allergies   Carbidopa-levodopa   Review of Systems As stated in HPI otherwise negative   Physical Exam Triage Vital Signs ED Triage Vitals  Enc Vitals Group     BP 08/03/19 1810 128/71     Pulse Rate 08/03/19 1810 69     Resp 08/03/19 1810 18     Temp 08/03/19 1810 98.3 F (36.8 C)     Temp Source 08/03/19 1810 Oral     SpO2 08/03/19 1810 98 %     Weight --      Height --      Head Circumference --      Peak Flow --      Pain Score 08/03/19 1805 7     Pain Loc --      Pain Edu? --      Excl. in [place]? --    No data found.  Updated Vital Signs BP 128/71 (BP Location: Right Arm)   Pulse 69   Temp 98.3 F (36.8 C) (Oral)   Resp 18   SpO2 98%     Physical Exam Constitutional:      General: He is not in acute distress.    Appearance: Normal appearance. He is not ill-appearing.  Musculoskeletal:        General: No swelling or deformity. Normal range of motion.     Comments: Tenderness upon palpation when direct pressure applied to mood gluteal region at SI joint.  Negative straight leg test  Neurological:     Mental Status: He is alert.      UC Treatments / Results  Labs (all labs ordered are listed, but only  abnormal results are displayed) Labs Reviewed - No data to display  EKG   Radiology No results found.  Procedures Procedures (including critical care time)  Medications Ordered in UC Medications - No data to display  Initial Impression / Assessment and Plan / UC Course  I have reviewed the triage vital signs and the nursing notes.  Pertinent labs & imaging results that were available during my care of the patient were reviewed by me and considered in my medical decision making (see chart for details).  Sciatica, right -Classic presentation without evidence of lumbar radiculopathy -Patient with history of CAD therefore will avoid NSAID use for now.  Short prednisone burst as patient with well-controlled DM2 with last A1c 6.2% in early June per patient -Modify activity, heat to affected area -f/u SM if symptoms persists   Final Clinical Impressions(s) / UC Diagnoses   Final diagnoses:  Sciatica of right side     Discharge Instructions     Take prednisone as prescribed. Use heat to affected area several times daily for 15 to 20 minutes at a time. Monitor your blood sugar closely while on prednisone. Continue gentle stretching    ED Prescriptions    Medication Sig Dispense Auth. Provider   predniSONE (DELTASONE) 50 MG tablet Take one tab daily x 7 days. 7 tablet Rudolpho Sevin, NP     PDMP not reviewed this encounter.   Rudolpho Sevin, NP 08/03/19 1927

## 2019-08-03 NOTE — Discharge Instructions (Addendum)
Take prednisone as prescribed. Use heat to affected area several times daily for 15 to 20 minutes at a time. Monitor your blood sugar closely while on prednisone. Continue gentle stretching

## 2019-08-03 NOTE — ED Triage Notes (Signed)
Pt c/o acute pain to right buttocks/hip area radiating down right hamstring while walking approx two weeks ago. Symptoms not relieved with tylenol.  Denies numbness/tingling to toes.

## 2019-08-04 DIAGNOSIS — M25551 Pain in right hip: Secondary | ICD-10-CM | POA: Diagnosis not present

## 2019-08-04 DIAGNOSIS — M5136 Other intervertebral disc degeneration, lumbar region: Secondary | ICD-10-CM | POA: Diagnosis not present

## 2019-08-05 ENCOUNTER — Other Ambulatory Visit: Payer: Self-pay | Admitting: Cardiology

## 2019-08-05 DIAGNOSIS — E782 Mixed hyperlipidemia: Secondary | ICD-10-CM

## 2019-08-08 ENCOUNTER — Other Ambulatory Visit: Payer: Self-pay | Admitting: Cardiology

## 2019-08-17 ENCOUNTER — Other Ambulatory Visit: Payer: Self-pay

## 2019-08-17 ENCOUNTER — Encounter (HOSPITAL_COMMUNITY): Payer: Self-pay | Admitting: Emergency Medicine

## 2019-08-17 ENCOUNTER — Emergency Department (HOSPITAL_COMMUNITY)
Admission: EM | Admit: 2019-08-17 | Discharge: 2019-08-18 | Disposition: A | Discharge status: Home / Self Care | Payer: No Typology Code available for payment source | Attending: Emergency Medicine | Admitting: Emergency Medicine

## 2019-08-17 DIAGNOSIS — R42 Dizziness and giddiness: Secondary | ICD-10-CM | POA: Diagnosis not present

## 2019-08-17 DIAGNOSIS — Z5321 Procedure and treatment not carried out due to patient leaving prior to being seen by health care provider: Secondary | ICD-10-CM | POA: Insufficient documentation

## 2019-08-17 LAB — CBC
HCT: 46 % (ref 39.0–52.0)
Hemoglobin: 14.7 g/dL (ref 13.0–17.0)
MCH: 28.5 pg (ref 26.0–34.0)
MCHC: 32 g/dL (ref 30.0–36.0)
MCV: 89.3 fL (ref 80.0–100.0)
Platelets: 290 10*3/uL (ref 150–400)
RBC: 5.15 MIL/uL (ref 4.22–5.81)
RDW: 15.9 % — ABNORMAL HIGH (ref 11.5–15.5)
WBC: 11.5 10*3/uL — ABNORMAL HIGH (ref 4.0–10.5)
nRBC: 0 % (ref 0.0–0.2)

## 2019-08-17 LAB — BASIC METABOLIC PANEL
Anion gap: 5 (ref 5–15)
BUN: 22 mg/dL (ref 8–23)
CO2: 29 mmol/L (ref 22–32)
Calcium: 9.5 mg/dL (ref 8.9–10.3)
Chloride: 102 mmol/L (ref 98–111)
Creatinine, Ser: 1.07 mg/dL (ref 0.61–1.24)
GFR calc Af Amer: >60 mL/min (ref >60)
GFR calc non Af Amer: >60 mL/min (ref >60)
Glucose, Bld: 108 mg/dL — ABNORMAL HIGH (ref 70–99)
Potassium: 4.3 mmol/L (ref 3.5–5.1)
Sodium: 136 mmol/L (ref 135–145)

## 2019-08-17 LAB — URINALYSIS, ROUTINE W REFLEX MICROSCOPIC
Bacteria, UA: NONE SEEN
Bilirubin Urine: NEGATIVE
Glucose, UA: >=500 mg/dL — AB
Hgb urine dipstick: NEGATIVE
Ketones, ur: NEGATIVE mg/dL
Leukocytes,Ua: NEGATIVE
Nitrite: NEGATIVE
Protein, ur: NEGATIVE mg/dL
Specific Gravity, Urine: 1.017 (ref 1.005–1.030)
pH: 6 (ref 5.0–8.0)

## 2019-08-17 MED ORDER — SODIUM CHLORIDE 0.9% FLUSH: 3.0000 mL | Freq: Once | INTRAVENOUS | Status: DC

## 2019-08-17 NOTE — ED Triage Notes (Signed)
Pt reports since last friday he has been feeling dizzy and a little "off or confused". Pt reports last Wednesday he started 3 new medications Divaproex, Trazodone & Pregabalin.

## 2019-08-18 NOTE — ED Notes (Signed)
Called for room multiple times with no answer

## 2019-08-19 DIAGNOSIS — Z794 Long term (current) use of insulin: Secondary | ICD-10-CM | POA: Diagnosis not present

## 2019-08-19 DIAGNOSIS — G92 Toxic encephalopathy: Secondary | ICD-10-CM | POA: Diagnosis not present

## 2019-08-19 DIAGNOSIS — M5416 Radiculopathy, lumbar region: Secondary | ICD-10-CM | POA: Diagnosis not present

## 2019-08-19 DIAGNOSIS — M109 Gout, unspecified: Secondary | ICD-10-CM | POA: Diagnosis not present

## 2019-08-19 DIAGNOSIS — E1142 Type 2 diabetes mellitus with diabetic polyneuropathy: Secondary | ICD-10-CM | POA: Diagnosis not present

## 2019-08-19 DIAGNOSIS — R55 Syncope and collapse: Secondary | ICD-10-CM | POA: Diagnosis not present

## 2019-08-19 DIAGNOSIS — Z20822 Contact with and (suspected) exposure to covid-19: Secondary | ICD-10-CM | POA: Diagnosis not present

## 2019-08-19 DIAGNOSIS — Z79890 Hormone replacement therapy (postmenopausal): Secondary | ICD-10-CM | POA: Diagnosis not present

## 2019-08-19 DIAGNOSIS — Z79899 Other long term (current) drug therapy: Secondary | ICD-10-CM | POA: Diagnosis not present

## 2019-08-19 DIAGNOSIS — R4182 Altered mental status, unspecified: Secondary | ICD-10-CM | POA: Diagnosis not present

## 2019-08-19 DIAGNOSIS — N400 Enlarged prostate without lower urinary tract symptoms: Secondary | ICD-10-CM | POA: Diagnosis not present

## 2019-08-19 DIAGNOSIS — E7800 Pure hypercholesterolemia, unspecified: Secondary | ICD-10-CM | POA: Diagnosis not present

## 2019-08-19 DIAGNOSIS — I2510 Atherosclerotic heart disease of native coronary artery without angina pectoris: Secondary | ICD-10-CM | POA: Diagnosis not present

## 2019-08-19 DIAGNOSIS — R42 Dizziness and giddiness: Secondary | ICD-10-CM | POA: Diagnosis not present

## 2019-08-19 DIAGNOSIS — Z7982 Long term (current) use of aspirin: Secondary | ICD-10-CM | POA: Diagnosis not present

## 2019-08-19 DIAGNOSIS — I10 Essential (primary) hypertension: Secondary | ICD-10-CM | POA: Diagnosis not present

## 2019-08-20 DIAGNOSIS — R42 Dizziness and giddiness: Secondary | ICD-10-CM | POA: Diagnosis not present

## 2019-08-20 DIAGNOSIS — G92 Toxic encephalopathy: Secondary | ICD-10-CM | POA: Diagnosis not present

## 2019-08-20 DIAGNOSIS — R4781 Slurred speech: Secondary | ICD-10-CM | POA: Diagnosis not present

## 2019-08-20 DIAGNOSIS — E1142 Type 2 diabetes mellitus with diabetic polyneuropathy: Secondary | ICD-10-CM | POA: Diagnosis not present

## 2019-08-21 DIAGNOSIS — R42 Dizziness and giddiness: Secondary | ICD-10-CM | POA: Diagnosis not present

## 2019-08-21 DIAGNOSIS — G92 Toxic encephalopathy: Secondary | ICD-10-CM | POA: Diagnosis not present

## 2019-08-21 DIAGNOSIS — R4781 Slurred speech: Secondary | ICD-10-CM | POA: Diagnosis not present

## 2019-08-21 DIAGNOSIS — E1142 Type 2 diabetes mellitus with diabetic polyneuropathy: Secondary | ICD-10-CM | POA: Diagnosis not present

## 2019-08-26 DIAGNOSIS — M545 Low back pain: Secondary | ICD-10-CM | POA: Diagnosis not present

## 2019-08-26 DIAGNOSIS — M5416 Radiculopathy, lumbar region: Secondary | ICD-10-CM | POA: Diagnosis not present

## 2019-09-01 ENCOUNTER — Encounter: Payer: Self-pay | Admitting: Family Medicine

## 2019-09-01 ENCOUNTER — Ambulatory Visit: Payer: HMO | Admitting: Family Medicine

## 2019-09-01 VITALS — BP 95/56 | HR 64 | Ht 70.0 in | Wt 229.6 lb

## 2019-09-01 DIAGNOSIS — G4733 Obstructive sleep apnea (adult) (pediatric): Secondary | ICD-10-CM | POA: Diagnosis not present

## 2019-09-01 DIAGNOSIS — G629 Polyneuropathy, unspecified: Secondary | ICD-10-CM

## 2019-09-01 DIAGNOSIS — Z9989 Dependence on other enabling machines and devices: Secondary | ICD-10-CM

## 2019-09-01 NOTE — Patient Instructions (Signed)
Please continue using your CPAP regularly. While your insurance requires that you use CPAP at least 4 hours each night on 70% of the nights, I recommend, that you not skip any nights and use it throughout the night if you can. Getting used to CPAP and staying with the treatment long term does take time and patience and discipline. Untreated obstructive sleep apnea when it is moderate to severe can have an adverse impact on cardiovascular health and raise her risk for heart disease, arrhythmias, hypertension, congestive heart failure, stroke and diabetes. Untreated obstructive sleep apnea causes sleep disruption, nonrestorative sleep, and sleep deprivation. This can have an impact on your day to day functioning and cause daytime sleepiness and impairment of cognitive function, memory loss, mood disturbance, and problems focussing. Using CPAP regularly can improve these symptoms.  Continue to follow up closely with your PCP and [facility] providers  Follow up in 1 year  Sleep Apnea Sleep apnea affects breathing during sleep. It causes breathing to stop for a short time or to become shallow. It can also increase the risk of:  Heart attack.  Stroke.  Being very overweight (obese).  Diabetes.  Heart failure.  Irregular heartbeat. The goal of treatment is to help you breathe normally again. What are the causes? There are three kinds of sleep apnea:  Obstructive sleep apnea. This is caused by a blocked or collapsed airway.  Central sleep apnea. This happens when the brain does not send the right signals to the muscles that control breathing.  Mixed sleep apnea. This is a combination of obstructive and central sleep apnea. The most common cause of this condition is a collapsed or blocked airway. This can happen if:  Your throat muscles are too relaxed.  Your tongue and tonsils are too large.  You are overweight.  Your airway is too small. What increases the risk?  Being  overweight.  Smoking.  Having a small airway.  Being older.  Being male.  Drinking alcohol.  Taking medicines to calm yourself (sedatives or tranquilizers).  Having family members with the condition. What are the signs or symptoms?  Trouble staying asleep.  Being sleepy or tired during the day.  Getting angry a lot.  Loud snoring.  Headaches in the morning.  Not being able to focus your mind (concentrate).  Forgetting things.  Less interest in sex.  Mood swings.  Personality changes.  Feelings of sadness (depression).  Waking up a lot during the night to pee (urinate).  Dry mouth.  Sore throat. How is this diagnosed?  Your medical history.  A physical exam.  A test that is done when you are sleeping (sleep study). The test is most often done in a sleep lab but may also be done at home. How is this treated?   Sleeping on your side.  Using a medicine to get rid of mucus in your nose (decongestant).  Avoiding the use of alcohol, medicines to help you relax, or certain pain medicines (narcotics).  Losing weight, if needed.  Changing your diet.  Not smoking.  Using a machine to open your airway while you sleep, such as: ? An oral appliance. This is a mouthpiece that shifts your lower jaw forward. ? A CPAP device. This device blows air through a mask when you breathe out (exhale). ? An EPAP device. This has valves that you put in each nostril. ? A BPAP device. This device blows air through a mask when you breathe in (inhale) and breathe out.  Having surgery if other treatments do not work. It is important to get treatment for sleep apnea. Without treatment, it can lead to:  High blood pressure.  Coronary artery disease.  In men, not being able to have an erection (impotence).  Reduced thinking ability. Follow these instructions at home: Lifestyle  Make changes that your doctor recommends.  Eat a healthy diet.  Lose weight if  needed.  Avoid alcohol, medicines to help you relax, and some pain medicines.  Do not use any products that contain nicotine or tobacco, such as cigarettes, e-cigarettes, and chewing tobacco. If you need help quitting, ask your doctor. General instructions  Take over-the-counter and prescription medicines only as told by your doctor.  If you were given a machine to use while you sleep, use it only as told by your doctor.  If you are having surgery, make sure to tell your doctor you have sleep apnea. You may need to bring your device with you.  Keep all follow-up visits as told by your doctor. This is important. Contact a doctor if:  The machine that you were given to use during sleep bothers you or does not seem to be working.  You do not get better.  You get worse. Get help right away if:  Your chest hurts.  You have trouble breathing in enough air.  You have an uncomfortable feeling in your back, arms, or stomach.  You have trouble talking.  One side of your body feels weak.  A part of your face is hanging down. These symptoms may be an emergency. Do not wait to see if the symptoms will go away. Get medical help right away. Call your local emergency services (911 in the [place] Do not drive yourself to the hospital. Summary  This condition affects breathing during sleep.  The most common cause is a collapsed or blocked airway.  The goal of treatment is to help you breathe normally while you sleep. This information is not intended to replace advice given to you by your health care provider. Make sure you discuss any questions you have with your health care provider. Document Revised: 10/24/2017 Document Reviewed: 09/02/2017 Elsevier Patient Education  2020 [employer], Adult Being a patient in the hospital puts you at risk for falling. Falls can cause serious injury and harm, but they can be prevented. It is important to understand  what puts you at risk for falling and what you and your health care team can do to prevent you from falling. If you or a loved one falls at the hospital, it is important to tell hospital staff about it. What increases the risk for falls? Certain conditions and treatments may increase your risk of falling in the hospital. These include:  Being in an unfamiliar environment, especially when using the bathroom at night.  Having surgery.  Being on bed rest.  Taking many medicines or certain types of medicines, such as sleeping pills.  Having tubes in place, such as IV lines or catheters. Other risk factors for falls in a hospital include:  Having difficulty with hearing or vision.  Having a change in thinking or behavior, such as confusion.  Having depression.  Having trouble with balance.  Being a male.  Feeling dizzy.  Needing to use the toilet frequently.  Having fallen during the past three months.  Having low blood pressure. What are some strategies for preventing falls? If you or a loved one has to  stay in the hospital:  Ask about which fall prevention strategies will be in place. Do not hesitate to speak up if you notice that the fall prevention plan has changed.  Ask for help moving around, especially after surgery or when feeling unwell.  If you have been asked to call for help when getting up, do not get up by yourself. Asking for help with getting up is for your safety, and the staff is there to help you.  Wear nonskid footwear.  Get up slowly, and sit at the side of the bed for a few minutes before standing up.  Keep items you need, such as the nurse call button or a phone, close to you so that you do not need to reach for them.  Wear eyeglasses or hearing aids if you have them.  Have someone stay in the hospital with you or your loved one.  Ask if sleeping pills or other medicines that can cause confusion are necessary. What does the hospital staff do to  help prevent falls? Hospitals have systems in place to prevent falls and accidents, which may involve:  Discussing your fall risks and making a personalized fall prevention plan.  Checking in regularly to see if you need help.  Placing an arm band on your wrist or a sign near your room to alert other staff of your needs.  Using an alarm on your hospital bed. This is an alarm that goes off if you get out of bed and forget to call for help.  Keeping the bed in a low and locked position.  Keeping the area around the bed and bathroom well-lit and free from clutter.  Keeping your room quiet, so that you can sleep and be well-rested.  Using safety equipment, such as: ? A belt around your waist. ? Walkers, crutches, and other devices for support. ? Safety beds, such as low beds or cushions on the floor next to the bed.  Having a staff person stay with you (one-on-one observation), even when you are using the bathroom. This is for your safety.  Using video monitoring. This allows a staff member to come to help you if you need help. What other actions can I take to lower my risk of falls?  Check in regularly with your health care provider or pharmacist to review all of the medicines that you take.  Make sure that you have a regular exercise program to stay fit. This will help you maintain your balance.  Talk with a physical therapist or trainer if recommended by your health care provider. They can help you to improve your strength, balance, and endurance.  If you are over age 68: ? Ask your health care provider if you need a calcium or vitamin D supplement. ? Have your eyes and hearing checked every year. ? Have your feet checked every year. Where to find more information You can find more information about fall prevention from the Centers for Disease Control and Prevention: ImproveLook.cz Summary  Being in an unfamiliar environment, such as the hospital, increases your risk for  falling.  If you have been asked to call for help when getting up, do not get up by yourself. Asking for help with getting up is for your safety, and the staff is there to help you.  Ask about which fall prevention strategies will be in place. Do not hesitate to speak up if you notice that the fall prevention plan has changed.  If you or a loved  one falls, tell the hospital staff. This is important. This information is not intended to replace advice given to you by your health care provider. Make sure you discuss any questions you have with your health care provider. Document Revised: 12/20/2016 Document Reviewed: 08/21/2016 Elsevier Patient Education  [place].

## 2019-09-01 NOTE — Progress Notes (Signed)
PATIENT: Anthony [name] DOB: [birth year]  REASON FOR VISIT: follow up HISTORY FROM: patient  Chief Complaint  Patient presents with  . Follow-up    73yrf/u for OSA. States he has been doing well since last visit  . room 6    alone     HISTORY OF PRESENT ILLNESS: Today 09/01/19 Anthony SLOMINSKIis a 74y.o. male here today for follow up for OSA on CPAP. He continues to use CPAP every night. He does note benefit of using CPAP.   Compliance report reveals he used CPAP 28/30 days for compliance of 93%. He used CPAP greater than 4 hours 28/30 days for compliance of 93%. Average usage was 7 hours and 32 minutes. Residual AHI was 5.8 on 8-13cmH20 and an EPR of 2. There was no significant leak noted.   [facility] and [facility] and agent orange exposure. He was previously taking gabapentin but was recently switched to Lyrica and trazodone for PTSD. He was seen by the ER in 07/2019 due to imbalance. Workup was unremarkable. He reports Lyrica was switched back to gabapentin and Trazodone discontinued. He reports feeling better. He is being followed closely.   HISTORY: (copied from my note on 08/31/2018)  Anthony MARUNAis a 74y.o. male here today for follow up of OSA on CPAP.  He is doing very well on CPAP therapy.  He reports that he is using his machine every night.  He does note significant benefit in energy levels and feels that he rest better at night.  Compliance report dated 07/28/2018 through 8 2020 reveals that he is using CPAP therapy every night for compliance of 100%.  Every night he is using CPAP greater than 4 hours.  Average usage is 7 hours and 49 minutes.  AHI is 3.7 on 8 to 13 cm of water and an EPR of 2.  There is no significant leak.  He [facility] for work-up related to potential agent orange exposure.  He does note some neuropathies in his hands.  He is currently treated with gabapentin.  He is uncertain if  this is helping.  HISTORY: (copied from Dr Dohmeier's note on 08/28/2017)  I have the pleasure of seeing Mr. DIdriss Robellotoday, meanwhile 74year old Caucasian left-handed gentleman with a history of obstructive sleep apnea and treated with CPAP. The patient also has diabetic neuropathy and he is [place] for many of his prescriptions and [facility] [place] [facility] He presents today on 28 August 2017 with his usual high compliance of 100% and average use at time of 7 hours 41 minutes with a residual AHI of 4.5. His AutoSet machine allows for a window of pressure between 8 and 13 cmH2O was 2 cm expiratory pressure relief he does not have major air leaks, his AHI only peaked for about 3 out of the last 30 days and I am can only speculatewhat led to it.  He is using nose pillows which may interfere with the airflow ashe has also nasal congestion /allergic rhinitis.  He endorsed the fatigue severity score of 21 points and the Epworth sleepiness score at 9 points, he did not endorse depression. His neuropathy is of burning quality, his planta pedi are " on fire" - gabapentin at night only helps with that.    Date ? MM:Mr. WBrandfordis a 74year old male with a history of obstructive sleep apnea , RLS  and peripheral neuropathy.  He returns today for an evaluation. His compliance download indicates that he uses machine 30 out of 30 days for compliance of 100%. He uses machine greater than 4 hours 30 out of 30 days for compliance of 100%. On average he uses his machine 8 hours and 17 minutes. His residual AHI is 2.9 on a minimum pressure of 8 cm of water and a maximum pressure of 13 cm of water with EPR of 2. The patient does not have a significant leak. Patient states that the increasing gabapentin was initially beneficial for his discomfort. He states that he only has significant discomfort at bedtime. He states that he has burning and tingling in the lower legs. He describes it as a hot poker  feeling on the bottom of his feet. Denies any changes with his gait or balance. He is only taking Gabapentin 600 mg at bedtime. He returns today for an evaluation.  08-23-2014 Anthony [name] is using CPAP at a 96 pressure percentile of 12 cm water in form of an AutoSet between 8 and 13 cm water with 2 cm water expiratory pressure relief. Residual AHI is 1.4 with 100% compliance average user time is 7 hours 42 minutes. He had trouble with [facility]'s billing services and has changed to our aero care. I would encourage him to try a new mask if he feels that it could be more comfortable, he has seen the dream wear mask in our office. Epworth sleepiness score is endorsed at 12 points, fatigue severity 34 points, geriatric depression score 10 out of 15 points. We will address this today as well. Since the patient still has burning feet at night but no longer restless legs on Neurontin 900 mg nightly I suggested a lidocaine or ropivacaine cream. As for his clinical depression; his retirement is not what he hoped for. He supports still his 6 year old daughter and her 35 year old sons.    HISTORY 08/23/14: Anthony [name] is a 74 year old male with a history of obstructive sleep apnea and numbness and tingling in the lower extremities. He returns today for follow-up. The patient's CPAP download indicates that he uses machine 30 out of 30 days for compliance 100%. He uses machine greater than 4 hours 29 out of 30 days for compliance of 97%. On average he uses machine 7 hours and 2 minutes. His AHI is 3 with a minimum pressure of 8 cm of water and maximum pressure of 13 cm of water with EPR of 2. The patient does not have a significant leak. Overall patient feels that the CPAP has been beneficial however he states in the last 5-6 weeks he has not been sleeping as well. He states that he wakes up with his legs hurting and has been having night sweats. The patient has an appointment tomorrow with his primary care  provider to talk about the night sweats. His Epworth score today is 16 and fatigue severity score is 39. He uses gabapentin 100 mg in the morning and at noon and 400 mg at bedtime. Initially this worked well for his symptoms. However he states that now he is waking up in the middle the night with his legs "aching.". He denies any new symptoms. Since the last visit the patient was diagnosed with pancreatitis and an enlarged liver. He returns today for an evaluation.   REVIEW OF SYSTEMS: Out of a complete 14 system review of symptoms, the patient complains only of the following symptoms, PTSD,  neuropathy, fatigue, and all other reviewed systems are negative.  ESS: 18 FSS:44  ALLERGIES: Allergies  Allergen Reactions  . Carbidopa-Levodopa Other (See Comments)    Made skin crawl     HOME MEDICATIONS: Outpatient Medications Prior to Visit  Medication Sig Dispense Refill  . allopurinol (ZYLOPRIM) 300 MG tablet Take 300 mg by mouth daily.     . Alogliptin Benzoate 25 MG TABS Take 25 mg by mouth daily.    Marland Kitchen amLODipine (NORVASC) 5 MG tablet TAKE 1 TABLET BY MOUTH EVERY DAY 90 tablet 2  . aspirin 81 MG EC tablet TAKE 1 TABLET BY MOUTH EVERY DAY (Patient taking differently: Take 81 mg by mouth daily. ) 90 tablet 0  . colchicine 0.6 MG tablet Take 0.6 mg by mouth daily as needed (for gout).    Marland Kitchen divalproex (DEPAKOTE) 250 MG DR tablet Take 250 mg by mouth at bedtime.    . DULoxetine (CYMBALTA) 30 MG capsule Take 30 mg by mouth at bedtime.    . empagliflozin (JARDIANCE) 10 MG TABS tablet Take 10 mg by mouth daily.     . furosemide (LASIX) 40 MG tablet Take 40 mg by mouth daily.    Marland Kitchen gabapentin (NEURONTIN) 100 MG capsule TAKE 1 CAPSULE BY MOUTH IN THE MORNING,1 CAPSULE AT NOON AND 2 CAPSULES AT BEDTIME    . gabapentin (NEURONTIN) 800 MG tablet Take 800 mg by mouth 3 (three) times daily.     . isosorbide mononitrate (IMDUR) 60 MG 24 hr tablet TAKE 1 TABLET BY MOUTH EVERY DAY 90 tablet 2  . ketoconazole  (NIZORAL) 2 % cream Apply 1 application topically daily as needed for irritation (yeast irritation).     Marland Kitchen lisinopril (PRINIVIL,ZESTRIL) 40 MG tablet Take 40 mg by mouth daily.     . metoprolol succinate (TOPROL-XL) 50 MG 24 hr tablet TAKE 1 TABLET (50 MG TOTAL) BY MOUTH DAILY. TAKE WITH OR IMMEDIATELY FOLLOWING A MEAL. 90 tablet 3  . metroNIDAZOLE (METROGEL) 0.75 % gel Apply 1 application topically daily as needed (Skin rash on face).     Marland Kitchen OVER THE COUNTER MEDICATION Apply 1 application topically daily as needed (Leg cramps). Thera Wox    . oxybutynin (DITROPAN) 5 MG tablet Take 5 mg by mouth at bedtime.    . pantoprazole (PROTONIX) 40 MG tablet Take 40 mg by mouth daily.     Vladimir Faster Glycol-Propyl Glycol (SYSTANE) 0.4-0.3 % SOLN Place 1 drop into both eyes daily as needed (Dry eye).    . polyethylene glycol (MIRALAX / GLYCOLAX) 17 g packet Take 17 g by mouth daily as needed for mild constipation or moderate constipation.    Marland Kitchen pyridOXINE (VITAMIN B-6) 100 MG tablet Take 100 mg by mouth daily.    . rosuvastatin (CRESTOR) 20 MG tablet TAKE 1 TABLET BY MOUTH EVERY DAY 90 tablet 3  . SYNTHROID 25 MCG tablet Take 25 mcg by mouth daily.     . tamsulosin (FLOMAX) 0.4 MG CAPS capsule Take 0.4 mg by mouth every evening.     Marland Kitchen TOUJEO SOLOSTAR 300 UNIT/ML SOPN Inject 42 Units into the skin at bedtime.   3  . predniSONE (DELTASONE) 50 MG tablet Take one tab daily x 7 days. 7 tablet 0  . nitroGLYCERIN (NITROSTAT) 0.4 MG SL tablet Place 1 tablet (0.4 mg total) under the tongue every 5 (five) minutes as needed for chest pain. 30 tablet 3  . metFORMIN (GLUCOPHAGE) 500 MG tablet Take 1 tablet (500 mg total) by  mouth 2 (two) times daily with a meal.     No facility-administered medications prior to visit.    PAST MEDICAL HISTORY: Past Medical History:  Diagnosis Date  . BPH (benign prostatic hyperplasia)   . Diabetes mellitus type 2, controlled ([place])   . Fatty liver   . GERD (gastroesophageal reflux  disease)   . Gout    last flare up last week right ankle   . HTN (hypertension)   . Hyperlipidemia   . Hypothyroid   . 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 Obesity (BMI 30.0-34.9) 05/14/2013  . OSA on CPAP    cpap setting of 10/ 13  . Pancreatitis dx march 2016  . Pneumonia 12 years ago    PAST SURGICAL HISTORY: Past Surgical History:  Procedure Laterality Date  . BACK SURGERY  10/2009   Cervical, arterior  . CARPAL TUNNEL RELEASE Left 2003  . CARPAL TUNNEL RELEASE Bilateral   . CATARACT EXTRACTION Left 01/2012  . EUS N/A 07/15/2014   Procedure: FULL UPPER ENDOSCOPIC ULTRASOUND (EUS) RADIAL;  Surgeon: Carol Ada, MD;  Location: [facility];  Service: Endoscopy;  Laterality: N/A;  . EYE SURGERY    . LEFT HEART CATH AND CORONARY ANGIOGRAPHY N/A 05/18/2019   Procedure: LEFT HEART CATH AND CORONARY ANGIOGRAPHY;  Surgeon: Adrian Prows, MD;  Location: [facility];  Service: Cardiovascular;  Laterality: N/A;  . NASAL SINUS SURGERY  1981  . RETINAL Left 06/2013   Retinal peel  . SHOULDER SURGERY Right 2003    FAMILY HISTORY: Family History  Problem Relation Age of Onset  . Colon cancer Father   . Lung cancer Brother   . Colon cancer Maternal Grandfather   . Heart Problems Maternal Grandfather   . Diabetes Maternal Grandmother   . Diabetes Sister     SOCIAL HISTORY: Social History   Socioeconomic History  . Marital status: Married    Spouse name: [name]  . Number of children: 2  . Years of education: 105  . Highest education level: Not on file  Occupational History  . Occupation: [employer]    Employer: [employer]  Tobacco Use  . Smoking status: Never Smoker  . Smokeless tobacco: Never Used  Vaping Use  . Vaping Use: Never used  Substance and Sexual Activity  . Alcohol use: No  . Drug use: No  . Sexual activity: Not on file  Other Topics Concern  . Not on file  Social History Narrative   Patient is married [name]) and lives at home with his wife.    Patient has two children (twins).   Patient is retired.   Patient has a high school education.   Patient does not drink any caffeine.   Patient is left-handed.   Social Determinants of Health   Financial Resource Strain:   . Difficulty of Paying Living Expenses:   Food Insecurity: No Food Insecurity  . Worried About Charity fundraiser in the Last Year: Never true  . Ran Out of Food in the Last Year: Never true  Transportation Needs: No Transportation Needs  . Lack of Transportation (Medical): No  . Lack of Transportation (Non-Medical): No  Physical Activity:   . Days of Exercise per Week:   . Minutes of Exercise per Session:   Stress:   . Feeling of Stress :   Social Connections:   . Frequency of Communication with Friends and Family:   . Frequency of Social Gatherings with Friends and Family:   . Attends  Religious Services:   . Active Member of Clubs or Organizations:   . Attends Archivist Meetings:   Marland Kitchen Marital Status:   Intimate Partner Violence:   . Fear of Current or Ex-Partner:   . Emotionally Abused:   Marland Kitchen Physically Abused:   . Sexually Abused:       PHYSICAL EXAM  Vitals:   09/01/19 0942  BP: (!) 95/56  Pulse: 64  Weight: 229 lb 9.6 oz (104.1 kg)  Height: '5\' 10"'$  (1.778 m)   Body mass index is 32.94 kg/m.  Generalized: Well developed, in no acute distress  Cardiology: normal rate and rhythm, no murmur noted Respiratory: clear to auscultation bilaterally  Neurological examination  Mentation: Alert oriented to time, place, history taking. Follows all commands speech and language fluent Cranial nerve II-XII: Pupils were equal round reactive to light. Extraocular movements were full, visual field were full on confrontational test. Facial sensation and strength were normal. Head turning and shoulder shrug  were normal and symmetric. Motor: The motor testing reveals 5 over 5 strength of all 4 extremities. Good symmetric motor tone is noted throughout.   Sensory: Sensory testing is intact to soft touch on all 4 extremities with exception of bilateral soles of feet. Pinprick testing decreased at top and soles of feet bilaterally. No evidence of extinction is noted.  Gait and station: Gait is normal.   DIAGNOSTIC DATA (LABS, IMAGING, TESTING) - I reviewed patient records, labs, notes, testing and imaging myself where available.  No flowsheet data found.   Lab Results  Component Value Date   WBC 11.5 (H) 08/17/2019   HGB 14.7 08/17/2019   HCT 46.0 08/17/2019   MCV 89.3 08/17/2019   PLT 290 08/17/2019      Component Value Date/Time   NA 136 08/17/2019 1643   K 4.3 08/17/2019 1643   CL 102 08/17/2019 1643   CO2 29 08/17/2019 1643   GLUCOSE 108 (H) 08/17/2019 1643   BUN 22 08/17/2019 1643   CREATININE 1.07 08/17/2019 1643   CALCIUM 9.5 08/17/2019 1643   PROT 6.9 11/10/2009 1554   ALBUMIN 4.3 11/10/2009 1554   AST 27 11/10/2009 1554   ALT 31 11/10/2009 1554   ALKPHOS 66 11/10/2009 1554   BILITOT 0.4 11/10/2009 1554   GFRNONAA >60 08/17/2019 1643   GFRAA >60 08/17/2019 1643   Lab Results  Component Value Date   CHOL 147 04/26/2019   HDL 37 (L) 04/26/2019   LDLCALC 84 04/26/2019   TRIG 149 04/26/2019   CHOLHDL 4.0 04/26/2019   No results found for: HGBA1C No results found for: VITAMINB12 No results found for: TSH     ASSESSMENT AND PLAN 74 y.o. year old male  has a past medical history of BPH (benign prostatic hyperplasia), Diabetes mellitus type 2, controlled ([place]), Fatty liver, GERD (gastroesophageal reflux disease), Gout, HTN (hypertension), Hyperlipidemia, Hypothyroid, Nasal septal deformity (05/14/2013), Obesity (BMI 30.0-34.9) (05/14/2013), OSA on CPAP, Pancreatitis (dx march 2016), and Pneumonia (12 years ago). here with     ICD-10-CM   1. OSA on CPAP  G47.33    Z99.89   2. Neuropathy  G62.9     Nicolis continues to do well on CPAP therapy. He was encouraged to continue using CPAP nightly and greater than 4  hours each night. I will repeat download in 4-6 weeks to assess AHI. Will follow up accordingly pending review. He will continue to work closely with his PCP and [facility] providers. Fall precautions advised. Healthy lifestyle habits encouraged.  He will follow up with [place] in 1 year. He verbalizes understanding and agreement with this plan.    No orders of the defined types were placed in this encounter.    No orders of the defined types were placed in this encounter.     I spent 15 minutes with the patient. 50% of this time was spent counseling and educating patient on plan of care and medications.    Debbora Presto, FNP-C 09/01/2019, 10:28 AM [facility] [address], [place], [place] [address] [phone]

## 2019-09-02 DIAGNOSIS — M5416 Radiculopathy, lumbar region: Secondary | ICD-10-CM | POA: Diagnosis not present

## 2019-09-08 ENCOUNTER — Other Ambulatory Visit: Payer: Self-pay

## 2019-09-08 NOTE — Patient Outreach (Signed)
[place] [facility]) Care Management Chronic Special Needs Program  09/08/2019  Name: Anthony [name] DOB: [birth year]  MRN: [id number]  Anthony [name] is enrolled in a chronic special needs plan for Diabetes. Reviewed and updated care plan.  Subjective: Client reports he is feeling better per chart: post response to medication adjustment. He states he is attending provider visits as scheduled. He states his last A1C was 6.1 on 07/08/19. Blood sugar today was 102. Client states the lowest his blood sugar has been is 87. Client reports last office visit with cardiologist was 06/03/2019.   Client states he continues to be active and walk, but is not able to walk the 5 mile distance he has in the past. He states he is being evaluated for pain lower back and right hip pain. He reports pain is helped by extra strength tylenol, heat and rest. He reports an upcoming appointment for to discuss treatment plan with his provider.   Goals Addressed            This Visit's Progress   . Client will report no worsening of symptoms related to heart disease within the next 6 months   On track    Continue to take your medications as recommended Continue to attend provider visits as recommended. Call your cardiologist for any questions or problems.  Last visit with cardiologist completed 06/03/19     . COMPLETED: Client will verbalize knowledge of self management of Hypertension as evidences by BP reading of 140/90 or less; or as defined by provider   On track    Discussed blood pressure. Client reports blood pressure less than 140/90.  Continue to take medications as prescribed and attending provider visits as scheduled. Follow up with your doctor as scheduled. Ask your doctor "what is my target blood pressure range". Monitor your blood pressure and take results to your doctor's appointment.      Marland Kitchen HEMOGLOBIN A1C < 7       A1C 6.6 on 07/08/19 and A1C 6.6 on 01/22/2019. Great job!!!  Continue  Diabetes self management actions:  Glucose monitoring per provider recommendations  Eat Healthy: low carbohydrate and low salt meals; watch portion sizes and avoid sugar sweetened drinks.  Visit provider every 3-6 months or as directed.  Hbg A1C level every 3-6 months or as directed.  Ask your doctor, "what is my Target A1C?"  Ask your doctor,"what is my Target A1C?"  Take your medications as prescribed.    . COMPLETED: Obtain annual  Lipid Profile, LDL-C       Done 04/26/2019    . COMPLETED: Obtain Annual Eye (retinal)  Exam        Per client done June 2021     . COMPLETED: Obtain Annual Foot Exam       Done 03/22/2019    . COMPLETED: Obtain Hemoglobin A1C at least 2 times per year       A1C 6.1 on 07/08/19 and 6.6 on 02/11/19.     Marland Kitchen Patient Stated: client will report decrease in pain level within the next 6 months.   On track    Follow up with your provider visits as scheduled. Continue to take medications as prescribed. Call your provider for questions or concerns. Emmi education: "low back pain in adults" and "hip pain". Please review and call if you have any questions. Continue to be as active as possible per provider recommendation.    . COMPLETED: Visit Primary Care Provider or Endocrinologist at least  2 times per year        Client reports has seen providers as recommended.        Plan: send updated care plan to client. Send updated care plan to primary care. Send Air traffic controller. Next outreach per tier level within the next 6 months.  Thea Silversmith, RN, MSN, [place] [phone]

## 2019-09-08 NOTE — Patient Outreach (Signed)
  [place] [facility]) Care Management Chronic Special Needs Program    09/08/2019  Name: Anthony [name], DOB: 17-Jul-1945  MRN: [id number]   [name] is enrolled in a chronic special needs plan for Diabetes. RNCM called to follow up and review individualized care plan. No answer. HIPAA compliant message left.   Plan: Chronic care management coordinator will attempt outreach within 1-2 weeks.  Thea Silversmith, RN, MSN, [place] [phone]

## 2019-09-15 ENCOUNTER — Ambulatory Visit: Payer: HMO

## 2019-09-15 DIAGNOSIS — M109 Gout, unspecified: Secondary | ICD-10-CM | POA: Diagnosis not present

## 2019-09-15 DIAGNOSIS — Z125 Encounter for screening for malignant neoplasm of prostate: Secondary | ICD-10-CM | POA: Diagnosis not present

## 2019-09-15 DIAGNOSIS — E7800 Pure hypercholesterolemia, unspecified: Secondary | ICD-10-CM | POA: Diagnosis not present

## 2019-09-15 DIAGNOSIS — E1165 Type 2 diabetes mellitus with hyperglycemia: Secondary | ICD-10-CM | POA: Diagnosis not present

## 2019-09-22 DIAGNOSIS — I7090 Unspecified atherosclerosis: Secondary | ICD-10-CM | POA: Diagnosis not present

## 2019-09-22 DIAGNOSIS — Z6833 Body mass index (BMI) 33.0-33.9, adult: Secondary | ICD-10-CM | POA: Diagnosis not present

## 2019-09-22 DIAGNOSIS — E039 Hypothyroidism, unspecified: Secondary | ICD-10-CM | POA: Diagnosis not present

## 2019-09-22 DIAGNOSIS — E1165 Type 2 diabetes mellitus with hyperglycemia: Secondary | ICD-10-CM | POA: Diagnosis not present

## 2019-09-22 DIAGNOSIS — I119 Hypertensive heart disease without heart failure: Secondary | ICD-10-CM | POA: Diagnosis not present

## 2019-09-22 DIAGNOSIS — E1140 Type 2 diabetes mellitus with diabetic neuropathy, unspecified: Secondary | ICD-10-CM | POA: Diagnosis not present

## 2019-09-22 DIAGNOSIS — R82998 Other abnormal findings in urine: Secondary | ICD-10-CM | POA: Diagnosis not present

## 2019-09-22 DIAGNOSIS — R32 Unspecified urinary incontinence: Secondary | ICD-10-CM | POA: Diagnosis not present

## 2019-09-22 DIAGNOSIS — N401 Enlarged prostate with lower urinary tract symptoms: Secondary | ICD-10-CM | POA: Diagnosis not present

## 2019-09-22 DIAGNOSIS — Z794 Long term (current) use of insulin: Secondary | ICD-10-CM | POA: Diagnosis not present

## 2019-09-22 DIAGNOSIS — Z0000 Encounter for general adult medical examination without abnormal findings: Secondary | ICD-10-CM | POA: Diagnosis not present

## 2019-09-22 DIAGNOSIS — M545 Low back pain: Secondary | ICD-10-CM | POA: Diagnosis not present

## 2019-09-22 DIAGNOSIS — E669 Obesity, unspecified: Secondary | ICD-10-CM | POA: Diagnosis not present

## 2019-09-23 DIAGNOSIS — Z1212 Encounter for screening for malignant neoplasm of rectum: Secondary | ICD-10-CM | POA: Diagnosis not present

## 2019-10-05 DIAGNOSIS — M48062 Spinal stenosis, lumbar region with neurogenic claudication: Secondary | ICD-10-CM | POA: Diagnosis not present

## 2019-10-13 DIAGNOSIS — E1140 Type 2 diabetes mellitus with diabetic neuropathy, unspecified: Secondary | ICD-10-CM | POA: Diagnosis not present

## 2019-10-13 DIAGNOSIS — Z23 Encounter for immunization: Secondary | ICD-10-CM | POA: Diagnosis not present

## 2019-10-13 DIAGNOSIS — I119 Hypertensive heart disease without heart failure: Secondary | ICD-10-CM | POA: Diagnosis not present

## 2019-10-13 DIAGNOSIS — Z794 Long term (current) use of insulin: Secondary | ICD-10-CM | POA: Diagnosis not present

## 2019-11-18 ENCOUNTER — Ambulatory Visit (INDEPENDENT_AMBULATORY_CARE_PROVIDER_SITE_OTHER): Payer: No Typology Code available for payment source | Admitting: Gastroenterology

## 2019-11-18 ENCOUNTER — Encounter: Payer: Self-pay | Admitting: Gastroenterology

## 2019-11-18 VITALS — BP 100/60 | HR 68 | Ht 69.25 in | Wt 240.5 lb

## 2019-11-18 DIAGNOSIS — R1310 Dysphagia, unspecified: Secondary | ICD-10-CM

## 2019-11-18 DIAGNOSIS — R1319 Other dysphagia: Secondary | ICD-10-CM | POA: Diagnosis not present

## 2019-11-18 DIAGNOSIS — K219 Gastro-esophageal reflux disease without esophagitis: Secondary | ICD-10-CM

## 2019-11-18 DIAGNOSIS — Z800 Family history of malignant neoplasm of digestive organs: Secondary | ICD-10-CM

## 2019-11-18 NOTE — Patient Instructions (Signed)
If you are age 74 or older, your body mass index should be between 23-30. Your Body mass index is 35.26 kg/m. If this is out of the aforementioned range listed, please consider follow up with your Primary Care Provider.  If you are age 75 or younger, your body mass index should be between 19-25. Your Body mass index is 35.26 kg/m. If this is out of the aformentioned range listed, please consider follow up with your Primary Care Provider.    You have been scheduled for an endoscopy. Please follow written instructions given to you at your visit today. If you use inhalers (even only as needed), please bring them with you on the day of your procedure.   You will be due for a recall colonoscopy in 2022. We will send you a reminder in the mail when it gets closer to that time.  We will try to obtain previous colonoscopy from Dr. Benson [place] office.   Thank you for choosing me and [facility].  Dr. Rush Landmark

## 2019-11-18 NOTE — Progress Notes (Signed)
[place] VISIT   Primary Care Provider Tisovec, Fransico Him, MD [address] [place] [address] [phone]  Referring Provider Kerin Perna, NP [address] [place],  [place] [address] [phone]  Patient Profile: Anthony [name] is a 74 y.o. male with a pmh significant for CAD, hypertension, hyperlipidemia, OSA, hypothyroidism, diabetes, BPH, idiopathic pancreatitis, gout, GERD, family history colon cancer (grandfather/father/brother).  The patient presents to the [facility] for an evaluation and management of problem(s) noted below:  Problem List 1. Esophageal dysphagia   2. Gastroesophageal reflux disease, unspecified whether esophagitis present   3. Family history of colon cancer     History of Present Illness This is the patient's first visit to the outpatient [place] clinic.  The patient has previously been seen by Dr. Benson Norway as an outpatient for EGD/EUS as well as a colonoscopy.  We do not have access to the records of the colonoscopy but it was reported as normal back in 2017.  EGD/EUS was performed for evaluation of idiopathic pancreatitis and the results are as below.  The patient states that over the course of the last 4 to 6 months he has been experiencing issues of dysphagia.  This can occur with solids but can also occur with liquids.  He denies any significant weight loss.  What he regurgitates is usually the foodstuffs or liquids that he has just tried to take down.  He points to his sternal notch as the region where he feels issues occurring.  His GERD symptoms are longstanding but he states he is well controlled does not have issues.  Patient has bowel movements that are soft on a daily basis.  He denies any blood in his stools.  He denies any significant abdominal pain or other GI symptoms.  He is on many medications but nothing new in the course the last few months.  He describes never having these  symptoms previously.  GI Review of Systems Positive as above Negative for odynophagia, nausea, decreased appetite, early satiety, change in bowel habits, melena, hematochezia  Review of Systems General: Denies fevers/chills/weight loss unintentionally HEENT: Denies oral lesions Cardiovascular: Denies chest pain Pulmonary: Denies shortness of breath/nocturnal cough Gastroenterological: See HPI Genitourinary: Denies darkened urine Hematological: Denies easy bruising/bleeding Dermatological: Denies jaundice Psychological: Mood is stable   Medications Current Outpatient Medications  Medication Sig Dispense Refill  . acetaminophen (TYLENOL) 500 MG tablet Take 500 mg by mouth every 6 (six) hours as needed.    Marland Kitchen allopurinol (ZYLOPRIM) 300 MG tablet Take 300 mg by mouth daily.     . Alogliptin Benzoate 25 MG TABS Take 25 mg by mouth daily.    Marland Kitchen amLODipine (NORVASC) 5 MG tablet TAKE 1 TABLET BY MOUTH EVERY DAY 90 tablet 2  . APPLE CIDER VINEGAR PO Take 2 tablets by mouth daily.    Marland Kitchen aspirin 81 MG EC tablet TAKE 1 TABLET BY MOUTH EVERY DAY (Patient taking differently: Take 81 mg by mouth daily. ) 90 tablet 0  . CAPSAICIN EX Apply 1 application topically in the morning and at bedtime.    . colchicine 0.6 MG tablet Take 0.6 mg by mouth daily as needed (for gout).    Marland Kitchen divalproex (DEPAKOTE) 250 MG DR tablet Take 250 mg by mouth at bedtime.    . empagliflozin (JARDIANCE) 10 MG TABS tablet Take 10 mg by mouth daily.     . furosemide (LASIX) 40 MG tablet Take 40 mg by mouth daily.    Marland Kitchen  gabapentin (NEURONTIN) 100 MG capsule Take 100 mg by mouth daily.    Marland Kitchen gabapentin (NEURONTIN) 600 MG tablet Take 600 mg by mouth 3 (three) times daily.    Marland Kitchen ibuprofen (ADVIL) 200 MG tablet Take 200-400 mg by mouth as needed. Three times a week    . isosorbide mononitrate (IMDUR) 60 MG 24 hr tablet TAKE 1 TABLET BY MOUTH EVERY DAY 90 tablet 2  . ketoconazole (NIZORAL) 2 % cream Apply 1 application topically daily as  needed for irritation (yeast irritation).     Marland Kitchen lisinopril (PRINIVIL,ZESTRIL) 40 MG tablet Take 40 mg by mouth daily.     . metoprolol succinate (TOPROL-XL) 50 MG 24 hr tablet TAKE 1 TABLET (50 MG TOTAL) BY MOUTH DAILY. TAKE WITH OR IMMEDIATELY FOLLOWING A MEAL. 90 tablet 3  . metroNIDAZOLE (METROGEL) 0.75 % gel Apply 1 application topically daily as needed (Skin rash on face).     . Mirabegron (MYRBETRIQ PO) Take 1 tablet by mouth daily.    . Multiple Vitamins-Minerals (CENTRUM SILVER PO) Take 1 tablet by mouth daily.    Marland Kitchen OVER THE COUNTER MEDICATION Apply 1 application topically daily as needed (Leg cramps). Thera Wox    . oxybutynin (DITROPAN) 5 MG tablet Take 5 mg by mouth at bedtime.    . pantoprazole (PROTONIX) 40 MG tablet Take 40 mg by mouth daily.     Vladimir Faster Glycol-Propyl Glycol (SYSTANE) 0.4-0.3 % SOLN Place 1 drop into both eyes daily as needed (Dry eye).    . polyethylene glycol (MIRALAX / GLYCOLAX) 17 g packet Take 17 g by mouth daily as needed for mild constipation or moderate constipation.    Marland Kitchen pyridOXINE (VITAMIN B-6) 100 MG tablet Take 100 mg by mouth daily.    . rosuvastatin (CRESTOR) 20 MG tablet TAKE 1 TABLET BY MOUTH EVERY DAY 90 tablet 3  . SYNTHROID 25 MCG tablet Take 25 mcg by mouth daily.     . tamsulosin (FLOMAX) 0.4 MG CAPS capsule Take 0.4 mg by mouth every evening.     Marland Kitchen TOUJEO SOLOSTAR 300 UNIT/ML SOPN Inject 42 Units into the skin at bedtime.   3  . triamcinolone cream (KENALOG) 0.1 % Apply 1 application topically at bedtime.    . nitroGLYCERIN (NITROSTAT) 0.4 MG SL tablet Place 1 tablet (0.4 mg total) under the tongue every 5 (five) minutes as needed for chest pain. (Patient not taking: Reported on 11/18/2019) 30 tablet 3   No current facility-administered medications for this visit.    Allergies Allergies  Allergen Reactions  . Carbidopa-Levodopa Other (See Comments)    Made skin crawl     Histories Past Medical History:  Diagnosis Date  . BPH  (benign prostatic hyperplasia)   . Diabetes mellitus type 2, controlled ([place])   . Fatty liver   . GERD (gastroesophageal reflux disease)   . Gout    last flare up last week right ankle   . Heart disease   . HTN (hypertension)   . Hyperlipidemia   . Hypothyroidism   . 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 Obesity (BMI 30.0-34.9) 05/14/2013  . OSA on CPAP    cpap setting of 10/ 13  . Pancreatitis dx march 2016  . Pneumonia 12 years ago   Past Surgical History:  Procedure Laterality Date  . BACK SURGERY  10/2009   Cervical, arterior  . CARPAL TUNNEL RELEASE Left 2003  . CARPAL TUNNEL RELEASE Bilateral   . CATARACT EXTRACTION Bilateral 01/2012  . EUS  N/A 07/15/2014   Procedure: FULL UPPER ENDOSCOPIC ULTRASOUND (EUS) RADIAL;  Surgeon: Carol Ada, MD;  Location: [facility];  Service: Endoscopy;  Laterality: N/A;  . LEFT HEART CATH AND CORONARY ANGIOGRAPHY N/A 05/18/2019   Procedure: LEFT HEART CATH AND CORONARY ANGIOGRAPHY;  Surgeon: Adrian Prows, MD;  Location: [facility];  Service: Cardiovascular;  Laterality: N/A;  . NASAL SINUS SURGERY  1981  . RETINAL Bilateral 06/2013   Retinal peel  . SHOULDER SURGERY Right 2003   Social History   Socioeconomic History  . Marital status: Married    Spouse name: [name]  . Number of children: 2  . Years of education: 93  . Highest education level: Not on file  Occupational History  . Occupation: [employer]    Employer: [employer]  Tobacco Use  . Smoking status: Never Smoker  . Smokeless tobacco: Never Used  Vaping Use  . Vaping Use: Never used  Substance and Sexual Activity  . Alcohol use: No  . Drug use: No  . Sexual activity: Not on file  Other Topics Concern  . Not on file  Social History Narrative   Patient is married [name]) and lives at home with his wife.   Patient has two children (twins).   Patient is retired.   Patient has a high school education.   Patient does not drink any caffeine.   Patient is  left-handed.   Social Determinants of Health   Financial Resource Strain:   . Difficulty of Paying Living Expenses: Not on file  Food Insecurity: No Food Insecurity  . Worried About Charity fundraiser in the Last Year: Never true  . Ran Out of Food in the Last Year: Never true  Transportation Needs: No Transportation Needs  . Lack of Transportation (Medical): No  . Lack of Transportation (Non-Medical): No  Physical Activity:   . Days of Exercise per Week: Not on file  . Minutes of Exercise per Session: Not on file  Stress:   . Feeling of Stress : Not on file  Social Connections:   . Frequency of Communication with Friends and Family: Not on file  . Frequency of Social Gatherings with Friends and Family: Not on file  . Attends Religious Services: Not on file  . Active Member of Clubs or Organizations: Not on file  . Attends Archivist Meetings: Not on file  . Marital Status: Not on file  Intimate Partner Violence:   . Fear of Current or Ex-Partner: Not on file  . Emotionally Abused: Not on file  . Physically Abused: Not on file  . Sexually Abused: Not on file   Family History  Problem Relation Age of Onset  . Colon cancer Father   . Heart disease Mother   . Diabetes Mother   . Lung cancer Brother   . Colon cancer Maternal Grandfather   . Heart Problems Maternal Grandfather   . Prostate cancer Maternal Grandfather   . Cancer Maternal Grandfather        right eye  . Heart attack Maternal Grandfather   . Diabetes Maternal Grandmother   . Heart attack Maternal Grandmother   . Diabetes Sister   . Colon cancer Brother 7   I have reviewed his medical, social, and family history in detail and updated the electronic medical record as necessary.    PHYSICAL EXAMINATION  BP 100/60 (BP Location: Left Arm, Patient Position: Sitting, Cuff Size: Normal)   Pulse 68   Ht 5' 9.25" (  1.759 m) Comment: height measured without shoes  Wt 240 lb 8 oz (109.1 kg)   BMI 35.26  kg/m  Wt Readings from Last 3 Encounters:  11/18/19 240 lb 8 oz (109.1 kg)  09/01/19 229 lb 9.6 oz (104.1 kg)  06/03/19 237 lb 6.4 oz (107.7 kg)  GEN: NAD, appears stated age, doesn't appear chronically ill PSYCH: Cooperative, without pressured speech EYE: Conjunctivae pink, sclerae anicteric ENT: MMM, without oral ulcers, no erythema or exudates noted CV: Nontachycardic RESP: No audible wheezing GI: NABS, soft, ventral diastases present, protuberant abdomen, rounded, NT, without rebound or guarding MSK/EXT: Trace bilateral lower extremity edema SKIN: No jaundice NEURO:  Alert & Oriented x 3, no focal deficits   REVIEW OF DATA  I reviewed the following data at the time of this encounter:  GI Procedures and Studies  June 2016 EUS 1) Normal pancreas. 2) Fatty liver. 3) Normal gallbladder.  Reported 2017 colonoscopy We will need to obtain results though was reported as normal based on the referral notes  Laboratory Studies  Reviewed those in epic and outside records  August 2020 Hemoglobin 14.5 Hematocrit 44.5 WBC 10.5 Platelet 297  April 2021 T bili 0.5 Direct bili 0.1 AST/ALT 17/23 Alk phos 96 Sodium 136 Potassium 5.4 Creatinine 1.1 BUN 16  Imaging Studies  2013 abdominal ultrasound IMPRESSION:  Mild gallbladder wall thickening without stones. Findings may  reflect changes of chronic cholecystitis. No changes to suggest  acute cholecystitis.    ASSESSMENT  Mr. Binette is a 74 y.o. male with a pmh significant for CAD, hypertension, hyperlipidemia, OSA, hypothyroidism, diabetes, BPH, idiopathic pancreatitis, gout, GERD, family history colon cancer (grandfather/father/brother).  The patient is seen today for evaluation and management of:  1. Esophageal dysphagia   2. Gastroesophageal reflux disease, unspecified whether esophagitis present   3. Family history of colon cancer    The patient is hemodynamically stable.  Clinically, he has symptoms of dysphagia  that require additional work-up.  The symptoms of liquid dysphagia as well make me concerned about the potential of an underlying esophageal dysmotility but structural issues need to be ruled out as well.  If we perform an upper endoscopy and rule out EOE and dilate the patient and he continues to have symptoms I would consider esophageal manometry as well as modified barium swallow with SLP evaluation to ensure oropharyngeal dysphagia is not present as well.  He will continue his GERD treatment as is and we will decide based on what we see whether there is any evidence of peptic stricturing or hiatal hernia as such that could require additional management.  The risks and benefits of endoscopic evaluation were discussed with the patient; these include but are not limited to the risk of perforation, infection, bleeding, missed lesions, lack of diagnosis, severe illness requiring hospitalization, as well as anesthesia and sedation related illnesses.  The patient is agreeable to proceed.  The patient has a family history of early colon cancer and we will try to obtain his last colonoscopy from Dr. Benson Norway.  He will require a 5-year follow-up which at this time based on the notes and records that we have would be next year.  We will put that into our records and work on trying to obtain the actual colonoscopy report.  All patient questions were answered to the best of my ability, and the patient agrees to the aforementioned plan of action with follow-up as indicated.   PLAN  Proceed with scheduling EGD for diagnostic purposes (esophageal and potentially  gastric biopsies as well as esophageal dilation to be performed) Obtain Dr. Benson Norway records for outpatient colonoscopy -Likely repeat colonoscopy in 2022 due to family history Continue current PPI therapy for now If work-up is unremarkable from a structural perspective then modified barium swallow with SLP evaluation plus esophageal manometry will be  recommended   Orders Placed This Encounter  Procedures  . Ambulatory referral to Gastroenterology    New Prescriptions   No medications on file   Modified Medications   No medications on file    Planned Follow Up No follow-ups on file.   Total Time in Face-to-Face and in Coordination of Care for patient including independent/personal interpretation/review of prior testing, medical history, examination, medication adjustment, communicating results with the patient directly, and documentation with the EHR is 45 minutes.   Justice Britain, MD [facility] Office # [phone]

## 2019-11-21 DIAGNOSIS — K219 Gastro-esophageal reflux disease without esophagitis: Secondary | ICD-10-CM | POA: Insufficient documentation

## 2019-11-21 DIAGNOSIS — R1319 Other dysphagia: Secondary | ICD-10-CM | POA: Insufficient documentation

## 2019-11-21 DIAGNOSIS — Z800 Family history of malignant neoplasm of digestive organs: Secondary | ICD-10-CM | POA: Insufficient documentation

## 2019-11-23 ENCOUNTER — Ambulatory Visit (AMBULATORY_SURGERY_CENTER): Payer: No Typology Code available for payment source | Admitting: Gastroenterology

## 2019-11-23 ENCOUNTER — Other Ambulatory Visit: Payer: Self-pay

## 2019-11-23 ENCOUNTER — Encounter: Payer: Self-pay | Admitting: Gastroenterology

## 2019-11-23 VITALS — BP 136/77 | HR 59 | Temp 97.5°F | Resp 18 | Ht 69.0 in | Wt 240.0 lb

## 2019-11-23 DIAGNOSIS — R1319 Other dysphagia: Secondary | ICD-10-CM

## 2019-11-23 DIAGNOSIS — K449 Diaphragmatic hernia without obstruction or gangrene: Secondary | ICD-10-CM

## 2019-11-23 DIAGNOSIS — K2900 Acute gastritis without bleeding: Secondary | ICD-10-CM

## 2019-11-23 DIAGNOSIS — K319 Disease of stomach and duodenum, unspecified: Secondary | ICD-10-CM | POA: Diagnosis not present

## 2019-11-23 DIAGNOSIS — K2951 Unspecified chronic gastritis with bleeding: Secondary | ICD-10-CM | POA: Diagnosis not present

## 2019-11-23 MED ORDER — SODIUM CHLORIDE 0.9 % IV SOLN: 500.0000 mL | Freq: Once | INTRAVENOUS | Status: DC

## 2019-11-23 MED ORDER — PANTOPRAZOLE SODIUM 40 MG PO TBEC: DELAYED_RELEASE_TABLET | ORAL | 0 refills | Status: AC

## 2019-11-23 NOTE — Op Note (Signed)
[facility] Patient Name: Anthony [name] Procedure Date: 11/23/2019 10:33 AM MRN: [id number] Endoscopist: Justice Britain , MD Age: 74 Referring MD:  Date of Birth: May 11, 1945 Gender: Male Account #: [id number] Procedure:                Upper GI endoscopy Indications:              Dysphagia Medicines:                Monitored Anesthesia Care Procedure:                Pre-Anesthesia Assessment:                           - Prior to the procedure, a History and Physical                            was performed, and patient medications and                            allergies were reviewed. The patient's tolerance of                            previous anesthesia was also reviewed. The risks                            and benefits of the procedure and the sedation                            options and risks were discussed with the patient.                            All questions were answered, and informed consent                            was obtained. Prior Anticoagulants: The patient has                            taken no previous anticoagulant or antiplatelet                            agents except for NSAID medication. ASA Grade                            Assessment: III - A patient with severe systemic                            disease. After reviewing the risks and benefits,                            the patient was deemed in satisfactory condition to                            undergo the procedure.  After obtaining informed consent, the endoscope was                            passed under direct vision. Throughout the                            procedure, the patient's blood pressure, pulse, and                            oxygen saturations were monitored continuously. The                            Endoscope was introduced through the mouth, and                            advanced to the second part of duodenum. The upper                             GI endoscopy was accomplished without difficulty.                            The patient tolerated the procedure. Scope In: Scope Out: Findings:                 No gross lesions were noted in the entire                            esophagus. Biopsies were taken with a cold forceps                            for histology to rule out EoE/LoE. After the rest                            of the EGD was completed, a guidewire was placed                            and the scope was withdrawn. Dilation was performed                            with a Savary dilator with mild resistance at 18                            mm. The dilation site was examined following                            endoscope reinsertion and showed no change.                           A 3 cm hiatal hernia was found. The proximal extent                            of the gastric folds (end of tubular esophagus) was  44 cm from the incisors. The hiatal narrowing was                            37 cm from the incisors. The Z-line was a variable                            distance from incisors; the hiatal hernia was                            sliding.                           Localized mild inflammation characterized by                            erosions and erythema was found in the gastric                            antrum.                           No other gross lesions were noted in the entire                            examined stomach. Biopsies were taken with a cold                            forceps for histology and Helicobacter pylori                            testing.                           No gross lesions were noted in the duodenal bulb,                            in the first portion of the duodenum and in the                            second portion of the duodenum. Complications:            No immediate complications. Estimated Blood Loss:     Estimated blood loss  was minimal. Impression:               - No gross lesions in esophagus. Biopsied. Dilated.                           - 3 cm hiatal hernia.                           - Gastritis. No other gross lesions in the stomach.                            Biopsied.                           -  No gross lesions in the duodenal bulb, in the                            first portion of the duodenum and in the second                            portion of the duodenum. Recommendation:           - The patient will be observed post-procedure,                            until all discharge criteria are met.                           - Discharge patient to home.                           - Patient has a contact number available for                            emergencies. The signs and symptoms of potential                            delayed complications were discussed with the                            patient. Return to normal activities tomorrow.                            Written discharge instructions were provided to the                            patient.                           - Dilation diet as per protocol.                           - Please use Cepacol or Halls Lozenges +/-                            Chloraseptic spray for next 72-96 hours to aid in                            sore thoat should you experience this.                           - Increase Protonix to 40 mg twice daily for next                            61-month.                           - Continue present medications otherwise.                           -  Await pathology results.                           - Repeat upper endoscopy PRN for retreatment.                           - If issues of dysphagia persist then will need                            Esophageal manometry and MBS + SLP evaluation as                            per prior clinic note to be considered.                           - The findings and recommendations were  discussed                            with the patient. Justice Britain, MD 11/23/2019 11:04:54 AM

## 2019-11-23 NOTE — Progress Notes (Signed)
Called to room to assist during endoscopic procedure.  Patient ID and intended procedure confirmed with present staff. Received instructions for my participation in the procedure from the performing physician.  

## 2019-11-23 NOTE — Progress Notes (Signed)
pt tolerated well. VSS. awake and to recovery. Report given to RN. Oral bite block inserted and removed without trauma. 

## 2019-11-23 NOTE — Patient Instructions (Signed)
Take your protonix twice daily 1/2 hour before meals for two months, then go back to once daily. Do not take this medication on a full stomach. Take Chloraseptic spray if you have a sore throat or cepacol lozenges..    YOU HAD AN ENDOSCOPIC PROCEDURE TODAY AT [facility]:   Refer to the procedure report that was given to you for any specific questions about what was found during the examination.  If the procedure report does not answer your questions, please call your gastroenterologist to clarify.  If you requested that your care partner not be given the details of your procedure findings, then the procedure report has been included in a sealed envelope for you to review at your convenience later.  YOU SHOULD EXPECT: Some feelings of bloating in the abdomen. Passage of more gas than usual.  Walking can help get rid of the air that was put into your GI tract during the procedure and reduce the bloating.   Please Note:  You might notice some irritation and congestion in your nose or some drainage.  This is from the oxygen used during your procedure.  There is no need for concern and it should clear up in a day or so.  SYMPTOMS TO REPORT IMMEDIATELY:   Following upper endoscopy (EGD)  Vomiting of blood or coffee ground material  New chest pain or pain under the shoulder blades  Painful or persistently difficult swallowing  New shortness of breath  Fever of 100F or higher  Black, tarry-looking stools  For urgent or emergent issues, a gastroenterologist can be reached at any hour by calling [phone]. Do not use MyChart messaging for urgent concerns.    DIET:  We do recommend nothing by mouth until 12 noon.  From 12-1 you may have clear liquids.  You may have a soft diet for the rest of today. A regular diet may resume tomorrow. Avoid alcoholic beverages for 24 hours.  ACTIVITY:  You should plan to take it easy for the rest of today and you should NOT DRIVE or use heavy  machinery until tomorrow (because of the sedation medicines used during the test).    FOLLOW UP: Our staff will call the number listed on your records 48-72 hours following your procedure to check on you and address any questions or concerns that you may have regarding the information given to you following your procedure. If we do not reach you, we will leave a message.  We will attempt to reach you two times.  During this call, we will ask if you have developed any symptoms of COVID 19. If you develop any symptoms (ie: fever, flu-like symptoms, shortness of breath, cough etc.) before then, please call [phone].  If you test positive for Covid 19 in the 2 weeks post procedure, please call and report this information to [place].    If any biopsies were taken you will be contacted by phone or by letter within the next 1-3 weeks.  Please call us at [phone] if you have not heard about the biopsies in 3 weeks.    SIGNATURES/CONFIDENTIALITY: You and/or your care partner have signed paperwork which will be entered into your electronic medical record.  These signatures attest to the fact that that the information above on your After Visit Summary has been reviewed and is understood.  Full responsibility of the confidentiality of this discharge information lies with you and/or your care-partner.

## 2019-11-23 NOTE — Progress Notes (Signed)
Pt's states no medical or surgical changes since previsit or office visit.  [place] - vitals 

## 2019-11-25 ENCOUNTER — Telehealth: Payer: Self-pay | Admitting: *Deleted

## 2019-11-25 NOTE — Telephone Encounter (Signed)
Patient returned the call states he is well and had no questions at this time

## 2019-11-25 NOTE — Telephone Encounter (Signed)
Unable to reach pt. Voicemail left.

## 2019-12-02 ENCOUNTER — Encounter: Payer: Self-pay | Admitting: Gastroenterology

## 2019-12-03 DIAGNOSIS — I2510 Atherosclerotic heart disease of native coronary artery without angina pectoris: Secondary | ICD-10-CM | POA: Insufficient documentation

## 2019-12-03 DIAGNOSIS — I25118 Atherosclerotic heart disease of native coronary artery with other forms of angina pectoris: Secondary | ICD-10-CM | POA: Insufficient documentation

## 2019-12-03 NOTE — Progress Notes (Signed)
Patient referred by Haywood Pao, MD for chest pain  Subjective:   Anthony [name], male    DOB: 05/20/1945, 74 y.o.   MRN: [id number]   Chief Complaint  Patient presents with  . Coronary artery disease of native artery of native heart wit  . Follow-up   74 y.o. Caucasian male with hypertension, hyperlipidemia, mod nonobstructive CAD, type 2 DM, OSA on CPAP, hypothyroidism, GERD  Given patient's worsening angina symptoms and prior abnormal stress test, he underwent coronary angiography on 05/18/2019.  This showed moderate nonobstructive coronary artery disease with suspicion for endothelial dysfunction.  His angina symptoms have improved after starting amlodipine. He walks 2.5 miles everyday, without any significant symptoms of angina. He has only occasional angina episodes, but has not required to take SL NTG. Blood pressure is well controlled.    Current Outpatient Medications on File Prior to Visit  Medication Sig Dispense Refill  . acetaminophen (TYLENOL) 500 MG tablet Take 500 mg by mouth every 6 (six) hours as needed.    Marland Kitchen allopurinol (ZYLOPRIM) 300 MG tablet Take 300 mg by mouth daily.     . Alogliptin Benzoate 25 MG TABS Take 25 mg by mouth daily.    Marland Kitchen amLODipine (NORVASC) 5 MG tablet TAKE 1 TABLET BY MOUTH EVERY DAY 90 tablet 2  . APPLE CIDER VINEGAR PO Take 2 tablets by mouth daily.    Marland Kitchen aspirin 81 MG EC tablet TAKE 1 TABLET BY MOUTH EVERY DAY (Patient taking differently: Take 81 mg by mouth daily. ) 90 tablet 0  . CAPSAICIN EX Apply 1 application topically in the morning and at bedtime.    . colchicine 0.6 MG tablet Take 0.6 mg by mouth daily as needed (for gout).    Marland Kitchen divalproex (DEPAKOTE) 250 MG DR tablet Take 250 mg by mouth at bedtime.    . empagliflozin (JARDIANCE) 10 MG TABS tablet Take 10 mg by mouth daily.     . furosemide (LASIX) 40 MG tablet Take 40 mg by mouth daily.    Marland Kitchen gabapentin (NEURONTIN) 100 MG capsule Take 100 mg by mouth daily.    Marland Kitchen gabapentin  (NEURONTIN) 600 MG tablet Take 600 mg by mouth 3 (three) times daily.    Marland Kitchen ibuprofen (ADVIL) 200 MG tablet Take 200-400 mg by mouth as needed. Three times a week    . isosorbide mononitrate (IMDUR) 60 MG 24 hr tablet TAKE 1 TABLET BY MOUTH EVERY DAY 90 tablet 2  . ketoconazole (NIZORAL) 2 % cream Apply 1 application topically daily as needed for irritation (yeast irritation).     Marland Kitchen lisinopril (PRINIVIL,ZESTRIL) 40 MG tablet Take 40 mg by mouth daily.     . metoprolol succinate (TOPROL-XL) 50 MG 24 hr tablet TAKE 1 TABLET (50 MG TOTAL) BY MOUTH DAILY. TAKE WITH OR IMMEDIATELY FOLLOWING A MEAL. 90 tablet 3  . metroNIDAZOLE (METROGEL) 0.75 % gel Apply 1 application topically daily as needed (Skin rash on face).     . Mirabegron (MYRBETRIQ PO) Take 1 tablet by mouth daily.    . Multiple Vitamins-Minerals (CENTRUM SILVER PO) Take 1 tablet by mouth daily.    . nitroGLYCERIN (NITROSTAT) 0.4 MG SL tablet Place 1 tablet (0.4 mg total) under the tongue every 5 (five) minutes as needed for chest pain. (Patient not taking: Reported on 11/18/2019) 30 tablet 3  . OVER THE COUNTER MEDICATION Apply 1 application topically daily as needed (Leg cramps). Thera Wox    . oxybutynin (DITROPAN) 5 MG  tablet Take 5 mg by mouth at bedtime.    . pantoprazole (PROTONIX) 40 MG tablet Take protonix 40 mg twice daily for 2 months.  Resume once per day after that. 60 tablet 0  . Polyethyl Glycol-Propyl Glycol (SYSTANE) 0.4-0.3 % SOLN Place 1 drop into both eyes daily as needed (Dry eye).    . polyethylene glycol (MIRALAX / GLYCOLAX) 17 g packet Take 17 g by mouth daily as needed for mild constipation or moderate constipation.    Marland Kitchen pyridOXINE (VITAMIN B-6) 100 MG tablet Take 100 mg by mouth daily.    . rosuvastatin (CRESTOR) 20 MG tablet TAKE 1 TABLET BY MOUTH EVERY DAY 90 tablet 3  . SYNTHROID 25 MCG tablet Take 25 mcg by mouth daily.     . tamsulosin (FLOMAX) 0.4 MG CAPS capsule Take 0.4 mg by mouth every evening.     Marland Kitchen TOUJEO  SOLOSTAR 300 UNIT/ML SOPN Inject 42 Units into the skin at bedtime.   3  . triamcinolone cream (KENALOG) 0.1 % Apply 1 application topically at bedtime.     No current facility-administered medications on file prior to visit.    Cardiovascular studies:  EKG 12/06/2019: Sinus rhythm 61 bpm Normal EKG  Coronary angiography 05/18/2019: Moderate diffuse ectasia involving specifically the LAD and also the right coronary artery with diffuse moderate amount of calcification, 50% stepdown post ectatic segment, slow flow which improved with intracoronary nitroglycerin to brisk flow. 50% mid ramus intermediate disease again mild ectasia noted. Recommendation: Patient already on isosorbide mononitrate, I will add amlodipine 5 mg daily.  Weight loss, exercise to improve endothelial function and control of diabetes recommended.  50 mill contrast utilized.  Lexiscan Myoview stress test 08/05/2018: Lexiscan stress test was performed. Stress EKG is non-diagnostic, as this is pharmacological stress test. SPECT imaging reveals small sized, mild intensity, reversible perfusion defect in mid to basal inferior/inferolateral myocardium, suggesting possible LCx/PDA territory ischemia. LVEF 62%. Low risk study.   Echocardiogram 07/23/2018:  1. The left ventricle has normal systolic function, with an ejection fraction of 55-60%. The cavity size was normal. Left ventricular diastolic Doppler parameters are consistent with impaired relaxation.  2. The right ventricle has normal systolic function. The cavity was normal. There is no increase in right ventricular wall thickness.  3. The aortic valve is tricuspid. Mild calcification of the aortic valve. Trace aortic stenosis. of the aortic valve.  4. No other significant valvular abnormalities seen.  CT Chest 07/14/2018: 1. No evidence of pulmonary embolism, although evaluation of the segmental pulmonary arteries is limited due to motion artifact. 2. Diffuse mosaic  attenuation of the lungs, nonspecific, but possibly reflecting small airways disease. 3.  Aortic atherosclerosis (ICD10-I70.0).  Recent labs: 08/17/2019: Glucose 108, BUN/Cr 22/1.07. EGFR >60. Na/K 136/4.3.  H/H 14/46. MCV 89. Platelets 290  05/18/2019:  BUN/Cr 17/1.17.  Na/K 138/4.1. Glucose 81. EGFR 60. Rest of the CMP normal H/H 14.9/. MCV 86. Platelets 388.  04/26/2019: Chol 147, TG 149, HDL 37, LDL 84  03/23/2019: A1C 6.700 % TSH 1.77   Review of Systems  Cardiovascular: Negative for chest pain, dyspnea on exertion, leg swelling, palpitations and syncope.         Vitals:   12/06/19 1007  BP: 135/73  Pulse: 76  Resp: 16  SpO2: 97%     Body mass index is 35.59 kg/m. Filed Weights   12/06/19 1007  Weight: 241 lb (109.3 kg)     Objective:   Physical Exam Vitals and nursing note reviewed.  Constitutional:      Appearance: He is well-developed.  Neck:     Vascular: No JVD.  Cardiovascular:     Rate and Rhythm: Normal rate and regular rhythm.     Pulses: Intact distal pulses.     Heart sounds: Normal heart sounds. No murmur heard.   Pulmonary:     Effort: Pulmonary effort is normal.     Breath sounds: Normal breath sounds. No wheezing or rales.           Assessment & Recommendations:   74 y.o. Caucasian male with hypertension, hyperlipidemia, mod nonobstructive CAD, type 2 DM, OSA on CPAP, hypothyroidism, GERD  CAD: Moderate nonobstructive disease with possible endothelial dysfunction.  Symptoms improved after adding amlodipine 5 mg daily. Continue aspirin, statin, rest of the antianginal therapy.  Hypertension: Well controlled.   Type 2 diabetes mellitus without complication: Continue current management, including Jardiance.  Mixed hyperlipidemia: Controlled.  F/u in 1 year  Nigel Mormon, MD [facility] Pager: [phone] Office: [phone] If no answer Cell [phone]

## 2019-12-06 ENCOUNTER — Encounter: Payer: Self-pay | Admitting: Cardiology

## 2019-12-06 ENCOUNTER — Other Ambulatory Visit: Payer: Self-pay

## 2019-12-06 ENCOUNTER — Ambulatory Visit: Payer: HMO | Admitting: Cardiology

## 2019-12-06 VITALS — BP 135/73 | HR 76 | Resp 16 | Ht 69.0 in | Wt 241.0 lb

## 2019-12-06 DIAGNOSIS — E119 Type 2 diabetes mellitus without complications: Secondary | ICD-10-CM | POA: Diagnosis not present

## 2019-12-06 DIAGNOSIS — I25118 Atherosclerotic heart disease of native coronary artery with other forms of angina pectoris: Secondary | ICD-10-CM | POA: Diagnosis not present

## 2019-12-06 DIAGNOSIS — I209 Angina pectoris, unspecified: Secondary | ICD-10-CM | POA: Diagnosis not present

## 2019-12-06 DIAGNOSIS — E782 Mixed hyperlipidemia: Secondary | ICD-10-CM | POA: Diagnosis not present

## 2019-12-06 DIAGNOSIS — I10 Essential (primary) hypertension: Secondary | ICD-10-CM

## 2019-12-06 MED ORDER — ROSUVASTATIN CALCIUM 20 MG PO TABS: 20.0000 mg | ORAL_TABLET | Freq: Every day | ORAL | 3 refills | Status: DC

## 2019-12-06 MED ORDER — ISOSORBIDE MONONITRATE ER 60 MG PO TB24: 60.0000 mg | ORAL_TABLET | Freq: Every day | ORAL | 3 refills | Status: DC

## 2019-12-06 MED ORDER — ASPIRIN 81 MG PO TBEC: 81.0000 mg | DELAYED_RELEASE_TABLET | Freq: Every day | ORAL | 3 refills | Status: DC

## 2019-12-06 MED ORDER — AMLODIPINE BESYLATE 5 MG PO TABS: 5.0000 mg | ORAL_TABLET | Freq: Every day | ORAL | 3 refills | Status: DC

## 2019-12-06 MED ORDER — METOPROLOL SUCCINATE ER 50 MG PO TB24: 50.0000 mg | ORAL_TABLET | Freq: Every day | ORAL | 3 refills | Status: DC

## 2019-12-09 ENCOUNTER — Encounter: Payer: Self-pay | Admitting: Gastroenterology

## 2019-12-09 NOTE — Progress Notes (Signed)
Review of outside records to be scanned into the chart  June 2017 colonoscopy by Dr. Benson Norway The entire examined colon is normal. Repeat colonoscopy recommended in 10 years.  June 2016 EUS Normal pancreas. Fatty liver. Normal gallbladder.  September 2020 follow-up in clinic Patient episode of diarrhea and urgency while off Metformin.  Decision made to consider a different diabetic medication.  Though it was felt that colchicine could be a potential culprit for diarrhea if it recurred off Metformin.  September 2020 follow-up in clinic Patient with urgency symptoms from Metformin although he had been on medication for 10 years.  He was asked to hold the Metformin for 1 week.  Plan was for follow-up.  May 2016 clinic note Patient seen in follow-up with plan for an EUS to be formed.  March 2016 clinic note Patient being evaluated for pancreatitis as well as GERD and need for colon cancer screening.  Laboratories were ordered.  Patient was dated to be seen in follow-up.  These labs will be scanned into the chart

## 2019-12-10 ENCOUNTER — Other Ambulatory Visit: Payer: Self-pay

## 2019-12-10 NOTE — Patient Outreach (Signed)
  [facility]) Care Management Chronic Special Needs Program    12/10/2019  Name: Anthony [name], DOB: Dec 29, 1945  MRN: [id number]   Anthony [name] is enrolled in a chronic special needs plan for Diabetes. [facility] will continue to provide services for this member through 01/21/2020. The [facility] [facility] Care Management Team will assume care 01/22/2020.   Thea Silversmith, RN, MSN, [place] [phone]

## 2019-12-28 DIAGNOSIS — L738 Other specified follicular disorders: Secondary | ICD-10-CM | POA: Diagnosis not present

## 2019-12-28 DIAGNOSIS — L570 Actinic keratosis: Secondary | ICD-10-CM | POA: Diagnosis not present

## 2019-12-28 DIAGNOSIS — L814 Other melanin hyperpigmentation: Secondary | ICD-10-CM | POA: Diagnosis not present

## 2019-12-28 DIAGNOSIS — L821 Other seborrheic keratosis: Secondary | ICD-10-CM | POA: Diagnosis not present

## 2019-12-28 DIAGNOSIS — L819 Disorder of pigmentation, unspecified: Secondary | ICD-10-CM | POA: Diagnosis not present

## 2019-12-28 DIAGNOSIS — D1801 Hemangioma of skin and subcutaneous tissue: Secondary | ICD-10-CM | POA: Diagnosis not present

## 2019-12-28 DIAGNOSIS — D229 Melanocytic nevi, unspecified: Secondary | ICD-10-CM | POA: Diagnosis not present

## 2019-12-28 DIAGNOSIS — L853 Xerosis cutis: Secondary | ICD-10-CM | POA: Diagnosis not present

## 2020-01-12 DIAGNOSIS — E1140 Type 2 diabetes mellitus with diabetic neuropathy, unspecified: Secondary | ICD-10-CM | POA: Diagnosis not present

## 2020-01-12 DIAGNOSIS — I119 Hypertensive heart disease without heart failure: Secondary | ICD-10-CM | POA: Diagnosis not present

## 2020-01-12 DIAGNOSIS — Z794 Long term (current) use of insulin: Secondary | ICD-10-CM | POA: Diagnosis not present

## 2020-01-25 ENCOUNTER — Telehealth: Payer: Self-pay | Admitting: *Deleted

## 2020-01-25 NOTE — Telephone Encounter (Signed)
Asking for MR relating to time 01-21-1998 and to present relating to cpap/OSA.  To MR.

## 2020-02-01 ENCOUNTER — Other Ambulatory Visit: Payer: Self-pay

## 2020-02-27 ENCOUNTER — Ambulatory Visit (HOSPITAL_COMMUNITY)
Admission: EM | Admit: 2020-02-27 | Discharge: 2020-02-27 | Disposition: A | Discharge status: Home / Self Care | Payer: HMO | Attending: Emergency Medicine | Admitting: Emergency Medicine

## 2020-02-27 ENCOUNTER — Other Ambulatory Visit: Payer: Self-pay

## 2020-02-27 ENCOUNTER — Encounter (HOSPITAL_COMMUNITY): Payer: Self-pay | Admitting: *Deleted

## 2020-02-27 DIAGNOSIS — Z20822 Contact with and (suspected) exposure to covid-19: Secondary | ICD-10-CM | POA: Insufficient documentation

## 2020-02-27 DIAGNOSIS — J019 Acute sinusitis, unspecified: Secondary | ICD-10-CM | POA: Diagnosis not present

## 2020-02-27 LAB — SARS CORONAVIRUS 2 (TAT 6-24 HRS): SARS Coronavirus 2: NEGATIVE

## 2020-02-27 MED ORDER — AMOXICILLIN-POT CLAVULANATE 875-125 MG PO TABS: 1.0000 | ORAL_TABLET | Freq: Two times a day (BID) | ORAL | 0 refills | Status: AC

## 2020-02-27 MED ORDER — PREDNISONE 20 MG PO TABS: 40.0000 mg | ORAL_TABLET | Freq: Every day | ORAL | 0 refills | Status: AC

## 2020-02-27 MED ORDER — FLUTICASONE PROPIONATE 50 MCG/ACT NA SUSP
1.0000 | Freq: Every day | NASAL | 0 refills | Status: DC
Start: 2020-02-27 — End: 2021-09-19

## 2020-02-27 NOTE — ED Provider Notes (Signed)
[facility]    CSN: [id number] Arrival date & time: 02/27/20  1002      History   Chief Complaint Chief Complaint  Patient presents with  . Nasal Congestion  . Headache    HPI Anthony [name] is a 75 y.o. male.   Anthony [name] presents with complaints of sinus congestion which has worsened over the past two days. States he has been having issues over the past month, has been using nasal saline which has helped some. Past two days has developed headache, facial pressure and ear pressure. No cough or shortness of breath . Worse at night when he uses his cpap, feels like he can't breathe due to the nasal congestion. No known ill contacts. His diabetes has been under good control. No gi symptoms. No sore throat. Denies any history of recurrent sinusitis.     ROS per HPI, negative if not otherwise mentioned.      Past Medical History:  Diagnosis Date  . BPH (benign prostatic hyperplasia)   . Diabetes mellitus type 2, controlled ([place])   . Fatty liver   . GERD (gastroesophageal reflux disease)   . Gout    last flare up last week right ankle   . Heart disease   . HTN (hypertension)   . Hyperlipidemia   . Hypothyroidism   . 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 Obesity (BMI 30.0-34.9) 05/14/2013  . OSA on CPAP    cpap setting of 10/ 13  . Pancreatitis dx march 2016  . Pneumonia 12 years ago    Patient Active Problem List   Diagnosis Date Noted  . Coronary artery disease of native artery of native heart with stable angina pectoris ([place]) 12/03/2019  . Esophageal dysphagia 11/21/2019  . Gastroesophageal reflux disease 11/21/2019  . Family history of colon cancer 11/21/2019  . Abnormal stress test 05/11/2019  . Mixed hyperlipidemia 05/11/2019  . Angina pectoris ([place]) 08/07/2018  . Essential hypertension 08/07/2018  . Type 2 diabetes mellitus without complication, without long-term current use of insulin ([place]) 08/07/2018  . Morbid obesity ([place]) 08/28/2017  .  Neuropathy 08/28/2017  . Numbness and tingling of both legs below knees 11/10/2013  . OSA on CPAP 05/14/2013  . 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 Obesity (BMI 30.0-34.9) 05/14/2013    Past Surgical History:  Procedure Laterality Date  . BACK SURGERY  10/2009   Cervical, arterior  . CARPAL TUNNEL RELEASE Left 2003  . CARPAL TUNNEL RELEASE Bilateral   . CATARACT EXTRACTION Bilateral 01/2012  . EUS N/A 07/15/2014   Procedure: FULL UPPER ENDOSCOPIC ULTRASOUND (EUS) RADIAL;  Surgeon: Carol Ada, MD;  Location: [facility];  Service: Endoscopy;  Laterality: N/A;  . LEFT HEART CATH AND CORONARY ANGIOGRAPHY N/A 05/18/2019   Procedure: LEFT HEART CATH AND CORONARY ANGIOGRAPHY;  Surgeon: Adrian Prows, MD;  Location: [facility];  Service: Cardiovascular;  Laterality: N/A;  . NASAL SINUS SURGERY  1981  . RETINAL Bilateral 06/2013   Retinal peel  . SHOULDER SURGERY Right 2003       Home Medications    Prior to Admission medications   Medication Sig Start Date End Date Taking? Authorizing Provider  acetaminophen (TYLENOL) 500 MG tablet Take 500 mg by mouth every 6 (six) hours as needed.   Yes [provider]  Alogliptin Benzoate 25 MG TABS Take 25 mg by mouth daily.   Yes [provider]  amLODipine (NORVASC) 5 MG tablet Take 1 tablet (5 mg total) by  mouth daily. 12/06/19  Yes Patwardhan, Manish J, MD  amoxicillin-clavulanate (AUGMENTIN) 875-125 MG tablet Take 1 tablet by mouth every 12 (twelve) hours for 10 days. 02/27/20 03/08/20 Yes Augusto Gamble B, NP  aspirin 81 MG EC tablet Take 1 tablet (81 mg total) by mouth daily. Swallow whole. 12/06/19  Yes Patwardhan, Manish J, MD  divalproex (DEPAKOTE) 250 MG DR tablet Take 250 mg by mouth at bedtime.   Yes [provider]  DULoxetine (CYMBALTA) 30 MG capsule Take 1 capsule by mouth daily.   Yes [provider]  empagliflozin (JARDIANCE) 10 MG TABS tablet Take 10 mg by mouth daily.    Yes [provider]  fluticasone (FLONASE) 50 MCG/ACT nasal spray Place 1 spray into both nostrils daily. 02/27/20  Yes Augusto Gamble B, NP  furosemide (LASIX) 40 MG tablet Take 40 mg by mouth daily.   Yes [provider]  gabapentin (NEURONTIN) 100 MG capsule Take 100 mg by mouth daily.   Yes [provider]  gabapentin (NEURONTIN) 600 MG tablet Take 600 mg by mouth 3 (three) times daily.   Yes [provider]  ibuprofen (ADVIL) 200 MG tablet Take 200-400 mg by mouth as needed. Three times a week   Yes [provider]  isosorbide mononitrate (IMDUR) 60 MG 24 hr tablet Take 1 tablet (60 mg total) by mouth daily. 12/06/19  Yes Patwardhan, Manish J, MD  lisinopril (PRINIVIL,ZESTRIL) 40 MG tablet Take 40 mg by mouth daily.  05/11/13  Yes [provider]  metoprolol succinate (TOPROL-XL) 50 MG 24 hr tablet Take 1 tablet (50 mg total) by mouth daily. Take with or immediately following a meal. 12/06/19  Yes Patwardhan, Manish J, MD  Mirabegron (MYRBETRIQ PO) Take 1 tablet by mouth daily.   Yes [provider]  Multiple Vitamins-Minerals (CENTRUM SILVER PO) Take 1 tablet by mouth daily.   Yes [provider]  oxybutynin (DITROPAN) 5 MG tablet Take 5 mg by mouth at bedtime.   Yes [provider]  pantoprazole (PROTONIX) 40 MG tablet Take protonix 40 mg twice daily for 2 months.  Resume once per day after that. 11/23/19  Yes Mansouraty, Telford Nab., MD  Polyethyl Glycol-Propyl Glycol (SYSTANE) 0.4-0.3 % SOLN Place 1 drop into both eyes daily as needed (Dry eye).   Yes [provider]  predniSONE (DELTASONE) 20 MG tablet Take 2 tablets (40 mg total) by mouth daily with breakfast for 5 days. 02/27/20 03/03/20 Yes Kanna Dafoe, Lanelle Bal B, NP  pyridOXINE (VITAMIN B-6) 100 MG tablet Take 100 mg by mouth daily.   Yes [provider]  rosuvastatin (CRESTOR) 20 MG tablet Take 1 tablet (20 mg total) by mouth daily. 12/06/19  Yes Patwardhan, Manish  J, MD  SYNTHROID 25 MCG tablet Take 25 mcg by mouth daily.  05/11/13  Yes [provider]  TOUJEO SOLOSTAR 300 UNIT/ML SOPN Inject 42 Units into the skin at bedtime.  07/31/17  Yes [provider]  allopurinol (ZYLOPRIM) 300 MG tablet Take 300 mg by mouth daily.  05/11/13   [provider]  CAPSAICIN EX Apply 1 application topically in the morning and at bedtime.    [provider]  colchicine 0.6 MG tablet Take 0.6 mg by mouth daily as needed (for gout).    [provider]  ketoconazole (NIZORAL) 2 % cream Apply 1 application topically daily as needed for irritation (yeast irritation).  03/02/19   [provider]  metroNIDAZOLE (METROGEL) 0.75 % gel Apply 1  application topically daily as needed (Skin rash on face).     [provider]  nitroGLYCERIN (NITROSTAT) 0.4 MG SL tablet Place 1 tablet (0.4 mg total) under the tongue every 5 (five) minutes as needed for chest pain. 07/18/18 12/06/19  Patwardhan, Reynold Bowen, MD  OVER THE COUNTER MEDICATION Apply 1 application topically daily as needed (Leg cramps). Thera Wox    [provider]  polyethylene glycol (MIRALAX / GLYCOLAX) 17 g packet Take 17 g by mouth daily as needed for mild constipation or moderate constipation.    [provider]  tamsulosin (FLOMAX) 0.4 MG CAPS capsule Take 0.4 mg by mouth every evening.  02/16/13   [provider]  triamcinolone cream (KENALOG) 0.1 % Apply 1 application topically at bedtime.    [provider]    Family History Family History  Problem Relation Age of Onset  . Colon cancer Father   . Heart disease Mother   . Diabetes Mother   . Lung cancer Brother   . Colon cancer Maternal Grandfather   . Heart Problems Maternal Grandfather   . Prostate cancer Maternal Grandfather   . Cancer Maternal Grandfather        right eye  . Heart attack Maternal Grandfather   . Diabetes Maternal Grandmother   . Heart attack Maternal  Grandmother   . Diabetes Sister   . Colon cancer Brother 7  . Stomach cancer Neg Hx   . Ulcerative colitis Neg Hx   . Esophageal cancer Neg Hx     Social History Social History   Tobacco Use  . Smoking status: Never Smoker  . Smokeless tobacco: Never Used  Vaping Use  . Vaping Use: Never used  Substance Use Topics  . Alcohol use: No  . Drug use: No     Allergies   Carbidopa-levodopa   Review of Systems Review of Systems   Physical Exam Triage Vital Signs ED Triage Vitals [02/27/20 1016]  Enc Vitals Group     BP 126/85     Pulse Rate 67     Resp 20     Temp 97.6 F (36.4 C)     Temp Source Oral     SpO2 98 %     Weight      Height      Head Circumference      Peak Flow      Pain Score 6     Pain Loc      Pain Edu?      Excl. in [place]?    No data found.  Updated Vital Signs BP 126/85 (BP Location: Right Arm)   Pulse 67   Temp 97.6 F (36.4 C) (Oral)   Resp 20   SpO2 98%   Visual Acuity Right Eye Distance:   Left Eye Distance:   Bilateral Distance:    Right Eye Near:   Left Eye Near:    Bilateral Near:     Physical Exam Constitutional:      Appearance: He is well-developed.  HENT:     Right Ear: Ear canal normal. A middle ear effusion is present.     Left Ear: Ear canal normal. A middle ear effusion is present.     Nose:     Right Sinus: Maxillary sinus tenderness present.     Left Sinus: Maxillary sinus tenderness present.  Cardiovascular:     Rate and Rhythm: Normal rate.  Pulmonary:     Effort: Pulmonary effort is normal.  Skin:    General: Skin is warm and dry.  Neurological:     Mental Status: He is alert and oriented to person, place, and time.      UC Treatments / Results  Labs (all labs ordered are listed, but only abnormal results are displayed) Labs Reviewed  SARS CORONAVIRUS 2 (TAT 6-24 HRS)    EKG   Radiology No results found.  Procedures Procedures (including critical care time)  Medications Ordered in  UC Medications - No data to display  Initial Impression / Assessment and Plan / UC Course  I have reviewed the triage vital signs and the nursing notes.  Pertinent labs & imaging results that were available during my care of the patient were reviewed by me and considered in my medical decision making (see chart for details).     Sinusitis. Antibiotics provided. Prednisone provided today, blood sugar monitoring discussed. Covid testing pending and isolation instructions provided.  Return precautions provided. Patient verbalized understanding and agreeable to plan.   Final Clinical Impressions(s) / UC Diagnoses   Final diagnoses:  Acute sinusitis, recurrence not specified, unspecified location     Discharge Instructions     If symptoms worsen or do not improve in the next week to return to be seen or to follow up with PCP.  Tylenol and/or ibuprofen as needed for pain or fevers.  Complete course of antibiotics.  Daily flonase may help with your symptoms.  Prednisone may also help with your symptoms. Please monitor your blood sugar as this medication will elevate your blood sugar. If you are feeling much improved you do not need to complete all 5 days.  We will notify of you any positive findings from your covid testing or if any changes to treatment are needed. If normal or otherwise without concern to your results, we will not call you. Please log on to your MyChart to review your results if interested in so.     ED Prescriptions    Medication Sig Dispense Auth. Provider   amoxicillin-clavulanate (AUGMENTIN) 875-125 MG tablet Take 1 tablet by mouth every 12 (twelve) hours for 10 days. 20 tablet Augusto Gamble B, NP   predniSONE (DELTASONE) 20 MG tablet Take 2 tablets (40 mg total) by mouth daily with breakfast for 5 days. 10 tablet Augusto Gamble B, NP   fluticasone (FLONASE) 50 MCG/ACT nasal spray Place 1 spray into both nostrils daily. 16 g Zigmund Gottron, NP     PDMP not  reviewed this encounter.   Zigmund Gottron, NP 02/27/20 1041

## 2020-02-27 NOTE — Discharge Instructions (Signed)
If symptoms worsen or do not improve in the next week to return to be seen or to follow up with PCP.  Tylenol and/or ibuprofen as needed for pain or fevers.  Complete course of antibiotics.  Daily flonase may help with your symptoms.  Prednisone may also help with your symptoms. Please monitor your blood sugar as this medication will elevate your blood sugar. If you are feeling much improved you do not need to complete all 5 days.  We will notify of you any positive findings from your covid testing or if any changes to treatment are needed. If normal or otherwise without concern to your results, we will not call you. Please log on to your MyChart to review your results if interested in so.

## 2020-02-27 NOTE — ED Triage Notes (Signed)
Pt reports sinus congestion for 1 month and now has a HA. Pt reports he uses a c-pap and due to sinus congestion he can not sleep at night.

## 2020-03-22 DIAGNOSIS — G4733 Obstructive sleep apnea (adult) (pediatric): Secondary | ICD-10-CM | POA: Diagnosis not present

## 2020-03-22 DIAGNOSIS — E039 Hypothyroidism, unspecified: Secondary | ICD-10-CM | POA: Diagnosis not present

## 2020-03-22 DIAGNOSIS — E7800 Pure hypercholesterolemia, unspecified: Secondary | ICD-10-CM | POA: Diagnosis not present

## 2020-03-22 DIAGNOSIS — Z794 Long term (current) use of insulin: Secondary | ICD-10-CM | POA: Diagnosis not present

## 2020-03-22 DIAGNOSIS — E669 Obesity, unspecified: Secondary | ICD-10-CM | POA: Diagnosis not present

## 2020-03-22 DIAGNOSIS — K219 Gastro-esophageal reflux disease without esophagitis: Secondary | ICD-10-CM | POA: Diagnosis not present

## 2020-03-22 DIAGNOSIS — G2581 Restless legs syndrome: Secondary | ICD-10-CM | POA: Diagnosis not present

## 2020-03-22 DIAGNOSIS — F4310 Post-traumatic stress disorder, unspecified: Secondary | ICD-10-CM | POA: Diagnosis not present

## 2020-03-22 DIAGNOSIS — M109 Gout, unspecified: Secondary | ICD-10-CM | POA: Diagnosis not present

## 2020-03-22 DIAGNOSIS — Z6833 Body mass index (BMI) 33.0-33.9, adult: Secondary | ICD-10-CM | POA: Diagnosis not present

## 2020-03-22 DIAGNOSIS — N401 Enlarged prostate with lower urinary tract symptoms: Secondary | ICD-10-CM | POA: Diagnosis not present

## 2020-03-22 DIAGNOSIS — D692 Other nonthrombocytopenic purpura: Secondary | ICD-10-CM | POA: Diagnosis not present

## 2020-03-22 DIAGNOSIS — E1140 Type 2 diabetes mellitus with diabetic neuropathy, unspecified: Secondary | ICD-10-CM | POA: Diagnosis not present

## 2020-03-22 DIAGNOSIS — E1165 Type 2 diabetes mellitus with hyperglycemia: Secondary | ICD-10-CM | POA: Diagnosis not present

## 2020-04-19 DIAGNOSIS — E1140 Type 2 diabetes mellitus with diabetic neuropathy, unspecified: Secondary | ICD-10-CM | POA: Diagnosis not present

## 2020-04-19 DIAGNOSIS — I119 Hypertensive heart disease without heart failure: Secondary | ICD-10-CM | POA: Diagnosis not present

## 2020-04-19 DIAGNOSIS — I10 Essential (primary) hypertension: Secondary | ICD-10-CM | POA: Diagnosis not present

## 2020-04-19 DIAGNOSIS — E7800 Pure hypercholesterolemia, unspecified: Secondary | ICD-10-CM | POA: Diagnosis not present

## 2020-04-19 DIAGNOSIS — Z794 Long term (current) use of insulin: Secondary | ICD-10-CM | POA: Diagnosis not present

## 2020-06-30 DIAGNOSIS — H47011 Ischemic optic neuropathy, right eye: Secondary | ICD-10-CM | POA: Diagnosis not present

## 2020-06-30 DIAGNOSIS — E113211 Type 2 diabetes mellitus with mild nonproliferative diabetic retinopathy with macular edema, right eye: Secondary | ICD-10-CM | POA: Diagnosis not present

## 2020-06-30 DIAGNOSIS — H35432 Paving stone degeneration of retina, left eye: Secondary | ICD-10-CM | POA: Diagnosis not present

## 2020-07-12 DIAGNOSIS — L309 Dermatitis, unspecified: Secondary | ICD-10-CM | POA: Diagnosis not present

## 2020-07-12 DIAGNOSIS — D1801 Hemangioma of skin and subcutaneous tissue: Secondary | ICD-10-CM | POA: Diagnosis not present

## 2020-07-12 DIAGNOSIS — D485 Neoplasm of uncertain behavior of skin: Secondary | ICD-10-CM | POA: Diagnosis not present

## 2020-07-25 DIAGNOSIS — Z794 Long term (current) use of insulin: Secondary | ICD-10-CM | POA: Diagnosis not present

## 2020-07-25 DIAGNOSIS — E7800 Pure hypercholesterolemia, unspecified: Secondary | ICD-10-CM | POA: Diagnosis not present

## 2020-07-25 DIAGNOSIS — I119 Hypertensive heart disease without heart failure: Secondary | ICD-10-CM | POA: Diagnosis not present

## 2020-07-25 DIAGNOSIS — E1140 Type 2 diabetes mellitus with diabetic neuropathy, unspecified: Secondary | ICD-10-CM | POA: Diagnosis not present

## 2020-08-17 DIAGNOSIS — R0989 Other specified symptoms and signs involving the circulatory and respiratory systems: Secondary | ICD-10-CM | POA: Diagnosis not present

## 2020-08-17 DIAGNOSIS — U071 COVID-19: Secondary | ICD-10-CM | POA: Diagnosis not present

## 2020-08-30 NOTE — Progress Notes (Signed)
PATIENT: Anthony [name] DOB: Aug 16, 1945  REASON FOR VISIT: follow up HISTORY FROM: patient  Chief Complaint  Patient presents with   Obstructive Sleep Apnea    Rm 15, alone. Here for yearly CPAP f/u. Last 3 weeks a blue light came on stating motor failure. The DME at the [facility] asked pt to bring his cpap for a possible replacement.       HISTORY OF PRESENT ILLNESS:  08/31/2020 ALL: Anthony [name] returns for follow up for OSA on CPAP. He continues to do very well with therapy. He is using CPAP nightly and greater than 4 hours every night. He states that he can not sleep without it. He wakes refreshed and feels he is more energized throughout the day. He has had difficulty with an error code on his machine stating motor life exceeded. [facility] We will send orders for a new CPAP.   Compliance report dated 08/01/2020-08/30/2020 shows that he has used CPAP 30/30 days for moe than 4 hours. He uses CPAP about 7 hours and 54 minutes, on average. Residual AHI was 6.9 over the past 30 days. Previous report from last follow up showed AHI 5.8 but all other follow up appt have shown normal AHI.  09/01/2019 ALL:  Anthony [name] is a 75 y.o. male here today for follow up for OSA on CPAP. He continues to use CPAP every night. He does note benefit of using CPAP.   Compliance report reveals he used CPAP 28/30 days for compliance of 93%. He used CPAP greater than 4 hours 28/30 days for compliance of 93%. Average usage was 7 hours and 32 minutes. Residual AHI was 5.8 on 8-13cmH20 and an EPR of 2. There was no significant leak noted.   He continues to [facility] and [facility] for [facility] and agent orange exposure. He was previously taking gabapentin but was recently switched to Lyrica and trazodone for PTSD. He was seen by the ER in 07/2019 due to imbalance. Workup was unremarkable. He reports Lyrica was switched back to gabapentin and Trazodone discontinued. He reports feeling better. He is  being followed closely.   HISTORY: (copied from my note on 08/31/2018)  Anthony [name] is a 75 y.o. male here today for follow up of OSA on CPAP.  He is doing very well on CPAP therapy.  He reports that he is using his machine every night.  He does note significant benefit in energy levels and feels that he rest better at night.  Compliance report dated 07/28/2018 through 8 2020 reveals that he is using CPAP therapy every night for compliance of 100%.  Every night he is using CPAP greater than 4 hours.  Average usage is 7 hours and 49 minutes.  AHI is 3.7 on 8 to 13 cm of water and an EPR of 2.  There is no significant leak.   He is following closely with the [place] and primary care for work-up related to potential agent orange exposure.  He does note some neuropathies in his hands.  He is currently treated with gabapentin.  He is uncertain if this is helping.   HISTORY: (copied from Dr [name]'s note on 08/28/2017)   I have the pleasure of seeing Anthony. Yadira [name] today, meanwhile 75 year old Caucasian left-handed gentleman with a history of obstructive sleep apnea and treated with CPAP.  The patient also has diabetic neuropathy and he is followed for many of his prescriptions and general primary  needs by the [place].  He presents today on 28 August 2017 with his usual high compliance of 100% and average use at time of 7 hours 41 minutes with a residual AHI of 4.5.  His AutoSet machine allows for a window of pressure between 8 and 13 cmH2O was 2 cm expiratory pressure relief he does not have major air leaks, his AHI only peaked for about 3 out of the last 30 days and I am can only speculate what led to it.   He is using nose pillows which may interfere with the airflow as he has also nasal congestion / allergic rhinitis.  He endorsed the fatigue severity score of 21 points and the Epworth sleepiness score at 9 points, he did not endorse depression. His neuropathy is of burning quality, his planta pedi are " on fire"  - gabapentin at night only helps with that.      Date ? MM: Anthony [name] is a 75 year old male with a history of obstructive sleep apnea , RLS and peripheral neuropathy.  He returns today for an evaluation. His compliance download indicates that he uses machine 30 out of 30 days for compliance of 100%. He uses machine greater than 4 hours 30 out of 30 days for compliance of 100%. On average he uses his machine 8 hours and 17 minutes. His residual AHI is 2.9 on a minimum pressure of 8 cm of water and a maximum pressure of 13 cm of water with EPR of 2. The patient does not have a significant leak. Patient states that the increasing gabapentin was initially beneficial for his discomfort. He states that he only has significant discomfort at bedtime. He states that he has burning and tingling in the lower legs. He describes it as a hot poker feeling on the bottom of his feet. Denies any changes with his gait or balance. He is only taking Gabapentin 600 mg at bedtime. He returns today for an evaluation.   08-23-2014 Anthony [name] is using CPAP at a 96 pressure percentile of 12 cm water in form of an AutoSet between 8 and 13 cm water with 2 cm water expiratory pressure relief. Residual AHI is 1.4 with 100% compliance average user time is 7 hours 42 minutes. He had trouble with [facility] billing services and has changed to our aero care. I would encourage him to try a new mask if he feels that it could be more comfortable, he has seen the dream wear mask in our office. Epworth sleepiness score is endorsed at 12 points, fatigue severity 34 points, geriatric depression score 10 out of 15 points. We will address this today as well. Since the patient still has burning feet at night but no longer restless legs on Neurontin 900 mg nightly I suggested a lidocaine or ropivacaine cream. As for his clinical depression; his retirement is not what he hoped for. He supports still his 76 year old daughter and her 56 year old  sons.      HISTORY 08/23/14: Anthony [name] is a 75 year old male with a history of obstructive sleep apnea and numbness and tingling in the lower extremities. He returns today for follow-up. The patient's CPAP download indicates that he uses machine 30 out of 30 days for compliance 100%. He uses machine greater than 4 hours 29 out of 30 days for compliance of 97%. On average he uses machine 7 hours and 2 minutes. His AHI is 3 with a minimum pressure of 8 cm of water  and maximum pressure of 13 cm of water with EPR of 2. The patient does not have a significant leak. Overall patient feels that the CPAP has been beneficial however he states in the last 5-6 weeks he has not been sleeping as well. He states that he wakes up with his legs hurting and has been having night sweats. The patient has an appointment tomorrow with his primary care provider to talk about the night sweats. His Epworth score today is 16 and fatigue severity score is 39. He uses gabapentin 100 mg in the morning and at noon and 400 mg at bedtime. Initially this worked well for his symptoms. However he states that now he is waking up in the middle the night with his legs "aching.". He denies any new symptoms. Since the last visit the patient was diagnosed with pancreatitis and an enlarged liver. He returns today for an evaluation.   REVIEW OF SYSTEMS: Out of a complete 14 system review of symptoms, the patient complains only of the following symptoms, PTSD, neuropathy, fatigue, and all other reviewed systems are negative.    ALLERGIES: Allergies  Allergen Reactions   Carbidopa-Levodopa Other (See Comments)    Made skin crawl     HOME MEDICATIONS: Outpatient Medications Prior to Visit  Medication Sig Dispense Refill   acetaminophen (TYLENOL) 500 MG tablet Take 500 mg by mouth every 6 (six) hours as needed.     allopurinol (ZYLOPRIM) 300 MG tablet Take 300 mg by mouth daily.      Alogliptin Benzoate 25 MG TABS Take 25 mg by mouth daily.      amLODipine (NORVASC) 5 MG tablet Take 1 tablet (5 mg total) by mouth daily. 90 tablet 3   aspirin 81 MG EC tablet Take 1 tablet (81 mg total) by mouth daily. Swallow whole. 90 tablet 3   CAPSAICIN EX Apply 1 application topically in the morning and at bedtime.     colchicine 0.6 MG tablet Take 0.6 mg by mouth daily as needed (for gout).     divalproex (DEPAKOTE) 250 MG DR tablet Take 250 mg by mouth at bedtime.     DULoxetine (CYMBALTA) 30 MG capsule Take 1 capsule by mouth daily.     empagliflozin (JARDIANCE) 10 MG TABS tablet Take 10 mg by mouth daily.      fluticasone (FLONASE) 50 MCG/ACT nasal spray Place 1 spray into both nostrils daily. 16 g 0   furosemide (LASIX) 40 MG tablet Take 40 mg by mouth daily.     gabapentin (NEURONTIN) 100 MG capsule Take 100 mg by mouth daily.     gabapentin (NEURONTIN) 600 MG tablet Take 600 mg by mouth 3 (three) times daily.     ibuprofen (ADVIL) 200 MG tablet Take 200-400 mg by mouth as needed. Three times a week     isosorbide mononitrate (IMDUR) 60 MG 24 hr tablet Take 1 tablet (60 mg total) by mouth daily. 90 tablet 3   ketoconazole (NIZORAL) 2 % cream Apply 1 application topically daily as needed for irritation (yeast irritation).      lisinopril (PRINIVIL,ZESTRIL) 40 MG tablet Take 40 mg by mouth daily.      metoprolol succinate (TOPROL-XL) 50 MG 24 hr tablet Take 1 tablet (50 mg total) by mouth daily. Take with or immediately following a meal. 90 tablet 3   metroNIDAZOLE (METROGEL) 0.75 % gel Apply 1 application topically daily as needed (Skin rash on face).      Mirabegron (MYRBETRIQ PO) Take 1  tablet by mouth daily.     Multiple Vitamins-Minerals (CENTRUM SILVER PO) Take 1 tablet by mouth daily.     OVER THE COUNTER MEDICATION Apply 1 application topically daily as needed (Leg cramps). Thera Wox     oxybutynin (DITROPAN) 5 MG tablet Take 5 mg by mouth at bedtime.     pantoprazole (PROTONIX) 40 MG tablet Take protonix 40 mg twice daily for 2  months.  Resume once per day after that. 60 tablet 0   Polyethyl Glycol-Propyl Glycol (SYSTANE) 0.4-0.3 % SOLN Place 1 drop into both eyes daily as needed (Dry eye).     polyethylene glycol (MIRALAX / GLYCOLAX) 17 g packet Take 17 g by mouth daily as needed for mild constipation or moderate constipation.     pyridOXINE (VITAMIN B-6) 100 MG tablet Take 100 mg by mouth daily.     rosuvastatin (CRESTOR) 20 MG tablet Take 1 tablet (20 mg total) by mouth daily. 90 tablet 3   SYNTHROID 25 MCG tablet Take 25 mcg by mouth daily.      tamsulosin (FLOMAX) 0.4 MG CAPS capsule Take 0.4 mg by mouth every evening.      TOUJEO SOLOSTAR 300 UNIT/ML SOPN Inject 42 Units into the skin at bedtime.   3   triamcinolone cream (KENALOG) 0.1 % Apply 1 application topically at bedtime.     nitroGLYCERIN (NITROSTAT) 0.4 MG SL tablet Place 1 tablet (0.4 mg total) under the tongue every 5 (five) minutes as needed for chest pain. 30 tablet 3   No facility-administered medications prior to visit.    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   Obesity (BMI 30.0-34.9) 05/14/2013   OSA on CPAP    cpap setting of 10/ 13   Pancreatitis dx march 2016   Pneumonia 12 years ago    PAST SURGICAL HISTORY: Past Surgical History:  Procedure Laterality Date   BACK SURGERY  10/2009   Cervical, arterior   CARPAL TUNNEL RELEASE Left 2003   CARPAL TUNNEL RELEASE Bilateral    CATARACT EXTRACTION Bilateral 01/2012   EUS N/A 07/15/2014   Procedure: FULL UPPER ENDOSCOPIC ULTRASOUND (EUS) RADIAL;  Surgeon: Carol Ada, MD;  Location: [facility];  Service: Endoscopy;  Laterality: N/A;   LEFT HEART CATH AND  CORONARY ANGIOGRAPHY N/A 05/18/2019   Procedure: LEFT HEART CATH AND CORONARY ANGIOGRAPHY;  Surgeon: Adrian Prows, MD;  Location: [facility];  Service: Cardiovascular;  Laterality: N/A;   NASAL SINUS SURGERY  1981   RETINAL Bilateral 06/2013   Retinal peel   SHOULDER SURGERY Right 2003    FAMILY HISTORY: Family History  Problem Relation Age of Onset   Colon cancer Father    Heart disease Mother    Diabetes Mother    Lung cancer Brother    Colon cancer Maternal Grandfather    Heart Problems Maternal Grandfather    Prostate cancer Maternal Grandfather    Cancer Maternal Grandfather        right eye   Heart attack Maternal Grandfather    Diabetes Maternal Grandmother    Heart attack Maternal Grandmother  Diabetes Sister    Colon cancer Brother 7   Stomach cancer Neg Hx    Ulcerative colitis Neg Hx    Esophageal cancer Neg Hx     SOCIAL HISTORY: Social History   Socioeconomic History   Marital status: Married    Spouse name: [name]   Number of children: 2   Years of education: 12   Highest education level: Not on file  Occupational History   Occupation: [employer]    Employer: [employer]  Tobacco Use   Smoking status: Never   Smokeless tobacco: Never  Vaping Use   Vaping Use: Never used  Substance and Sexual Activity   Alcohol use: No   Drug use: No   Sexual activity: Not on file  Other Topics Concern   Not on file  Social History Narrative   Patient is married [name]) and lives at home with his wife.   Patient has two children (twins).   Patient is retired.   Patient has a high school education.   Patient does not drink any caffeine.   Patient is left-handed.   Social Determinants of Health   Financial Resource Strain: Not on file  Food Insecurity: Not on file  Transportation Needs: Not on file  Physical Activity: Not on file  Stress: Not on file  Social Connections: Not on file  Intimate Partner Violence: Not on file      PHYSICAL  EXAM  Vitals:   08/31/20 0902  BP: 129/69  Pulse: 64  Weight: 241 lb 6.4 oz (109.5 kg)  Height: '5\' 10"'$  (1.778 m)    Body mass index is 34.64 kg/m.  Generalized: Well developed, in no acute distress  Cardiology: normal rate and rhythm, no murmur noted Respiratory: clear to auscultation bilaterally  Neurological examination  Mentation: Alert oriented to time, place, history taking. Follows all commands speech and language fluent Cranial nerve II-XII: Pupils were equal round reactive to light. Extraocular movements were full, visual field were full on confrontational test. Facial sensation and strength were normal. Head turning and shoulder shrug  were normal and symmetric. Motor: The motor testing reveals 5 over 5 strength of all 4 extremities. Good symmetric motor tone is noted throughout.  Gait and station: Gait is normal.   DIAGNOSTIC DATA (LABS, IMAGING, TESTING) - I reviewed patient records, labs, notes, testing and imaging myself where available.  No flowsheet data found.   Lab Results  Component Value Date   WBC 11.5 (H) 08/17/2019   HGB 14.7 08/17/2019   HCT 46.0 08/17/2019   MCV 89.3 08/17/2019   PLT 290 08/17/2019      Component Value Date/Time   NA 136 08/17/2019 1643   K 4.3 08/17/2019 1643   CL 102 08/17/2019 1643   CO2 29 08/17/2019 1643   GLUCOSE 108 (H) 08/17/2019 1643   BUN 22 08/17/2019 1643   CREATININE 1.07 08/17/2019 1643   CALCIUM 9.5 08/17/2019 1643   PROT 6.9 11/10/2009 1554   ALBUMIN 4.3 11/10/2009 1554   AST 27 11/10/2009 1554   ALT 31 11/10/2009 1554   ALKPHOS 66 11/10/2009 1554   BILITOT 0.4 11/10/2009 1554   GFRNONAA >60 08/17/2019 1643   GFRAA >60 08/17/2019 1643   Lab Results  Component Value Date   CHOL 147 04/26/2019   HDL 37 (L) 04/26/2019   LDLCALC 84 04/26/2019   TRIG 149 04/26/2019   CHOLHDL 4.0 04/26/2019   No results found for: HGBA1C No results found for: VITAMINB12 No results found for:  TSH     ASSESSMENT AND  PLAN 75 y.o. year old male  has a past medical history of BPH (benign prostatic hyperplasia), Diabetes mellitus type 2, controlled ([place]), Fatty liver, GERD (gastroesophageal reflux disease), Gout, Heart disease, HTN (hypertension), Hyperlipidemia, Hypothyroidism, Nasal septal deformity (05/14/2013), Obesity (BMI 30.0-34.9) (05/14/2013), OSA on CPAP, Pancreatitis (dx march 2016), and Pneumonia (12 years ago). here with     ICD-10-CM   1. OSA on CPAP  G47.33 For home use only DME continuous positive airway pressure (CPAP)   Z99.89        Lamontre continues to do well on CPAP therapy. He was encouraged to continue using CPAP nightly and greater than 4 hours each night. I will send orders for a new CPAP machine with current settings. We will monitor AHI closely with new machine and may consider pressure adjustments pending review. He will continue to work closely with his PCP and [place] providers. Fall precautions advised. Healthy lifestyle habits encouraged. He will follow up with [place] in 31-90 days following CPAP set up. He verbalizes understanding and agreement with this plan.    Orders Placed This Encounter  Procedures   For home use only DME continuous positive airway pressure (CPAP)    New CPAP machine, continue current setting of min pressure 8cmH20 and max pressure 13cmH20. EPR 2    Order Specific Question:   Length of Need    Answer:   Lifetime    Order Specific Question:   Patient has OSA or probable OSA    Answer:   Yes    Order Specific Question:   Is the patient currently using CPAP in the home    Answer:   Yes    Order Specific Question:   Settings    Answer:   Other see comments    Order Specific Question:   CPAP supplies needed    Answer:   Mask, headgear, cushions, filters, heated tubing and water chamber      No orders of the defined types were placed in this encounter.      Debbora Presto, FNP-C 08/31/2020, 9:39 AM [facility] [address], [place], [place] [address] [phone]

## 2020-08-31 ENCOUNTER — Ambulatory Visit: Payer: HMO | Admitting: Family Medicine

## 2020-08-31 ENCOUNTER — Encounter: Payer: Self-pay | Admitting: Family Medicine

## 2020-08-31 VITALS — BP 129/69 | HR 64 | Ht 70.0 in | Wt 241.4 lb

## 2020-08-31 DIAGNOSIS — Z9989 Dependence on other enabling machines and devices: Secondary | ICD-10-CM

## 2020-08-31 DIAGNOSIS — G4733 Obstructive sleep apnea (adult) (pediatric): Secondary | ICD-10-CM

## 2020-08-31 NOTE — Patient Instructions (Signed)
Please continue using your CPAP regularly. While your insurance requires that you use CPAP at least 4 hours each night on 70% of the nights, I recommend, that you not skip any nights and use it throughout the night if you can. Getting used to CPAP and staying with the treatment long term does take time and patience and discipline. Untreated obstructive sleep apnea when it is moderate to severe can have an adverse impact on cardiovascular health and raise her risk for heart disease, arrhythmias, hypertension, congestive heart failure, stroke and diabetes. Untreated obstructive sleep apnea causes sleep disruption, nonrestorative sleep, and sleep deprivation. This can have an impact on your day to day functioning and cause daytime sleepiness and impairment of cognitive function, memory loss, mood disturbance, and problems focussing. Using CPAP regularly can improve these symptoms.   We will send orders for a new machine. Continue using your current machine. I will check on you in 4 weeks to see what [facility]. We may adjust pressure if needed.

## 2020-09-13 ENCOUNTER — Other Ambulatory Visit: Payer: Self-pay | Admitting: Cardiology

## 2020-09-13 DIAGNOSIS — I209 Angina pectoris, unspecified: Secondary | ICD-10-CM

## 2020-09-13 DIAGNOSIS — I25118 Atherosclerotic heart disease of native coronary artery with other forms of angina pectoris: Secondary | ICD-10-CM

## 2020-10-02 ENCOUNTER — Telehealth: Payer: Self-pay | Admitting: Family Medicine

## 2020-10-02 DIAGNOSIS — E7800 Pure hypercholesterolemia, unspecified: Secondary | ICD-10-CM | POA: Diagnosis not present

## 2020-10-02 DIAGNOSIS — E1165 Type 2 diabetes mellitus with hyperglycemia: Secondary | ICD-10-CM | POA: Diagnosis not present

## 2020-10-02 DIAGNOSIS — M109 Gout, unspecified: Secondary | ICD-10-CM | POA: Diagnosis not present

## 2020-10-02 DIAGNOSIS — E039 Hypothyroidism, unspecified: Secondary | ICD-10-CM | POA: Diagnosis not present

## 2020-10-02 NOTE — Telephone Encounter (Signed)
I called the pt and left a vm to check status on this.  If pt calls back, TE staff can relay and inquire on this message.

## 2020-10-02 NOTE — Telephone Encounter (Signed)
Can you guys please check with Anthony [name] to make sure he was able to get in touch with DME or [facility] regarding new CPAP machine. TY!

## 2020-10-02 NOTE — Telephone Encounter (Signed)
Pt called and states yes he was able to get in touch with them and they stated he would be able to pick up the machine on 10/23/2020.

## 2020-10-09 DIAGNOSIS — Z125 Encounter for screening for malignant neoplasm of prostate: Secondary | ICD-10-CM | POA: Diagnosis not present

## 2020-10-09 DIAGNOSIS — Z794 Long term (current) use of insulin: Secondary | ICD-10-CM | POA: Diagnosis not present

## 2020-10-09 DIAGNOSIS — E1165 Type 2 diabetes mellitus with hyperglycemia: Secondary | ICD-10-CM | POA: Diagnosis not present

## 2020-10-09 DIAGNOSIS — E669 Obesity, unspecified: Secondary | ICD-10-CM | POA: Diagnosis not present

## 2020-10-09 DIAGNOSIS — R32 Unspecified urinary incontinence: Secondary | ICD-10-CM | POA: Diagnosis not present

## 2020-10-09 DIAGNOSIS — D692 Other nonthrombocytopenic purpura: Secondary | ICD-10-CM | POA: Diagnosis not present

## 2020-10-09 DIAGNOSIS — Z0000 Encounter for general adult medical examination without abnormal findings: Secondary | ICD-10-CM | POA: Diagnosis not present

## 2020-10-09 DIAGNOSIS — Z23 Encounter for immunization: Secondary | ICD-10-CM | POA: Diagnosis not present

## 2020-10-09 DIAGNOSIS — R82998 Other abnormal findings in urine: Secondary | ICD-10-CM | POA: Diagnosis not present

## 2020-10-09 DIAGNOSIS — Z1331 Encounter for screening for depression: Secondary | ICD-10-CM | POA: Diagnosis not present

## 2020-10-09 DIAGNOSIS — E1140 Type 2 diabetes mellitus with diabetic neuropathy, unspecified: Secondary | ICD-10-CM | POA: Diagnosis not present

## 2020-10-09 DIAGNOSIS — E039 Hypothyroidism, unspecified: Secondary | ICD-10-CM | POA: Diagnosis not present

## 2020-10-09 DIAGNOSIS — E7800 Pure hypercholesterolemia, unspecified: Secondary | ICD-10-CM | POA: Diagnosis not present

## 2020-10-09 DIAGNOSIS — Z1339 Encounter for screening examination for other mental health and behavioral disorders: Secondary | ICD-10-CM | POA: Diagnosis not present

## 2020-10-25 DIAGNOSIS — I119 Hypertensive heart disease without heart failure: Secondary | ICD-10-CM | POA: Diagnosis not present

## 2020-10-25 DIAGNOSIS — E7800 Pure hypercholesterolemia, unspecified: Secondary | ICD-10-CM | POA: Diagnosis not present

## 2020-10-25 DIAGNOSIS — Z794 Long term (current) use of insulin: Secondary | ICD-10-CM | POA: Diagnosis not present

## 2020-10-25 DIAGNOSIS — E1140 Type 2 diabetes mellitus with diabetic neuropathy, unspecified: Secondary | ICD-10-CM | POA: Diagnosis not present

## 2020-10-25 NOTE — Telephone Encounter (Signed)
Called pt back. He confirmed he received new cpap machine but only got regular tubing (not heated), no filter.  He got set up w/ Air 11. He is being told by [facility] they are out of cpap supplies/unsure when it will be in stock. Advised we are under impression that there is cpap machine shortage but not on supplies. Recommended he f/u with them again on this. Unsure if they are short on supplies. Other option is for him to purchase OOP via another DME or purchase online. He will look into his options and call back w/ any other questions/concerns.

## 2020-10-25 NOTE — Telephone Encounter (Signed)
Pt called states he is needing a new Hose and filter for CPAP machine. Order needs to be sent to DME or the [place].

## 2020-10-28 ENCOUNTER — Ambulatory Visit (HOSPITAL_COMMUNITY)
Admission: EM | Admit: 2020-10-28 | Discharge: 2020-10-28 | Disposition: A | Discharge status: Home / Self Care | Payer: No Typology Code available for payment source | Attending: Family Medicine | Admitting: Family Medicine

## 2020-10-28 ENCOUNTER — Encounter (HOSPITAL_COMMUNITY): Payer: Self-pay

## 2020-10-28 ENCOUNTER — Other Ambulatory Visit: Payer: Self-pay

## 2020-10-28 DIAGNOSIS — R109 Unspecified abdominal pain: Secondary | ICD-10-CM

## 2020-10-28 LAB — POCT URINALYSIS DIPSTICK, ED / UC
Bilirubin Urine: NEGATIVE
Glucose, UA: >=1000 mg/dL — AB
Hgb urine dipstick: NEGATIVE
Leukocytes,Ua: NEGATIVE
Nitrite: NEGATIVE
Protein, ur: NEGATIVE mg/dL
Specific Gravity, Urine: 1.01 (ref 1.005–1.030)
Urobilinogen, UA: 0.2 mg/dL (ref 0.0–1.0)
pH: 6 (ref 5.0–8.0)

## 2020-10-28 MED ORDER — CYCLOBENZAPRINE HCL 5 MG PO TABS: 5.0000 mg | ORAL_TABLET | Freq: Two times a day (BID) | ORAL | 0 refills | Status: DC | PRN

## 2020-10-28 MED ORDER — KETOROLAC TROMETHAMINE 30 MG/ML IJ SOLN
30.0000 mg | Freq: Once | INTRAMUSCULAR | Status: AC
  Administered 2020-10-28: 30 mg via INTRAMUSCULAR

## 2020-10-28 MED ORDER — KETOROLAC TROMETHAMINE 30 MG/ML IJ SOLN
INTRAMUSCULAR | Status: AC
  Filled 2020-10-28: qty 1

## 2020-10-28 NOTE — ED Triage Notes (Signed)
Pt presents with left side pain X 3 days.   Pt states the pain has been severe. States the pain has progressed.

## 2020-10-31 DIAGNOSIS — E1140 Type 2 diabetes mellitus with diabetic neuropathy, unspecified: Secondary | ICD-10-CM | POA: Diagnosis not present

## 2020-10-31 DIAGNOSIS — F4310 Post-traumatic stress disorder, unspecified: Secondary | ICD-10-CM | POA: Diagnosis not present

## 2020-10-31 DIAGNOSIS — G2581 Restless legs syndrome: Secondary | ICD-10-CM | POA: Diagnosis not present

## 2020-10-31 DIAGNOSIS — R251 Tremor, unspecified: Secondary | ICD-10-CM | POA: Diagnosis not present

## 2020-10-31 DIAGNOSIS — E039 Hypothyroidism, unspecified: Secondary | ICD-10-CM | POA: Diagnosis not present

## 2020-11-01 NOTE — ED Provider Notes (Signed)
[facility]    CSN: [id number] Arrival date & time: 10/28/20  1517      History   Chief Complaint Chief Complaint  Patient presents with   Flank Pain    HPI Anthony [name] is a 75 y.o. male.   Patient presenting today with left flank pain for the past 3 days that is worsening.  Pain is constant and worse with movement.  Seemed to start after he was getting dressed in the bathroom and twisted wrong.  Denies discoloration to the area, bruising, swelling, radiation of pain, numbness, tingling, weakness to extremities, nausea, vomiting, diarrhea, fevers, chest pain, shortness of breath.  Has been taking over-the-counter pain relievers with minimal relief.  Denies any history of similar issues.   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   Obesity (BMI 30.0-34.9) 05/14/2013   OSA on CPAP    cpap setting of 10/ 13   Pancreatitis dx march 2016   Pneumonia 12 years ago    Patient Active Problem List   Diagnosis Date Noted   Coronary artery disease of native artery of native heart with stable angina pectoris ([place]) 12/03/2019   Esophageal dysphagia 11/21/2019   Gastroesophageal reflux disease 11/21/2019   Family history of colon cancer 11/21/2019   Abnormal stress test 05/11/2019   Mixed hyperlipidemia 05/11/2019   Angina pectoris ([place]) 08/07/2018   Essential hypertension 08/07/2018   Type 2 diabetes mellitus without complication, without long-term current use of insulin ([place]) 08/07/2018   Morbid obesity ([place]) 08/28/2017   Neuropathy 08/28/2017   Numbness and tingling of both legs below knees 11/10/2013   OSA on CPAP 05/14/2013   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    Obesity (BMI 30.0-34.9) 05/14/2013    Past Surgical History:  Procedure Laterality Date   BACK SURGERY  10/2009   Cervical, arterior   CARPAL TUNNEL RELEASE Left 2003   CARPAL TUNNEL RELEASE Bilateral    CATARACT EXTRACTION Bilateral 01/2012   EUS N/A 07/15/2014   Procedure: FULL UPPER ENDOSCOPIC ULTRASOUND (EUS) RADIAL;  Surgeon: Carol Ada, MD;  Location: [facility];  Service: Endoscopy;  Laterality: N/A;   LEFT HEART CATH AND CORONARY ANGIOGRAPHY N/A 05/18/2019   Procedure: LEFT HEART CATH AND CORONARY ANGIOGRAPHY;  Surgeon: Adrian Prows, MD;  Location: [facility];  Service: Cardiovascular;  Laterality: N/A;   NASAL SINUS SURGERY  1981   RETINAL Bilateral 06/2013   Retinal peel   SHOULDER SURGERY Right 2003     Home Medications    Prior to Admission medications   Medication Sig Start Date End Date Taking? Authorizing Provider  cyclobenzaprine (FLEXERIL) 5 MG tablet Take 1 tablet (5 mg total) by mouth 2 (two) times daily as needed for muscle spasms. 10/28/20  Yes Volney American, PA-C  acetaminophen (TYLENOL) 500 MG tablet Take 500 mg by mouth every 6 (six) hours as needed.    [provider]  allopurinol (ZYLOPRIM) 300 MG tablet Take 300 mg by mouth daily.  05/11/13   [provider]  Alogliptin Benzoate 25 MG TABS Take 25 mg by mouth daily.    [provider]  amLODipine (NORVASC) 5 MG tablet Take 1 tablet (5 mg total) by mouth daily. 12/06/19   Patwardhan, Reynold Bowen, MD  aspirin 81 MG EC tablet TAKE 1 TABLET (81 MG TOTAL) BY MOUTH DAILY. SWALLOW WHOLE. 09/14/20   Patwardhan, Reynold Bowen, MD  CAPSAICIN EX Apply 1 application topically in the morning and at bedtime.    [provider]  colchicine 0.6 MG tablet Take 0.6 mg by mouth daily as needed (for gout).    [provider]  divalproex (DEPAKOTE) 250 MG DR tablet Take 250 mg by mouth at bedtime.    [provider]  DULoxetine (CYMBALTA) 30 MG capsule Take 1 capsule  by mouth daily.    [provider]  empagliflozin (JARDIANCE) 10 MG TABS tablet Take 10 mg by mouth daily.     [provider]  fluticasone (FLONASE) 50 MCG/ACT nasal spray Place 1 spray into both nostrils daily. 02/27/20   Zigmund Gottron, NP  furosemide (LASIX) 40 MG tablet Take 40 mg by mouth daily.    [provider]  gabapentin (NEURONTIN) 100 MG capsule Take 100 mg by mouth daily.    [provider]  gabapentin (NEURONTIN) 600 MG tablet Take 600 mg by mouth 3 (three) times daily.    [provider]  ibuprofen (ADVIL) 200 MG tablet Take 200-400 mg by mouth as needed. Three times a week    [provider]  isosorbide mononitrate (IMDUR) 60 MG 24 hr tablet Take 1 tablet (60 mg total) by mouth daily. 12/06/19   Patwardhan, Reynold Bowen, MD  ketoconazole (NIZORAL) 2 % cream Apply 1 application topically daily as needed for irritation (yeast irritation).  03/02/19   [provider]  lisinopril (PRINIVIL,ZESTRIL) 40 MG tablet Take 40 mg by mouth daily.  05/11/13   [provider]  metoprolol succinate (TOPROL-XL) 50 MG 24 hr tablet Take 1 tablet (50 mg total) by mouth daily. Take with or immediately following a meal. 12/06/19   Patwardhan, Manish J, MD  metroNIDAZOLE (METROGEL) 0.75 % gel Apply 1 application topically daily as needed (Skin rash on face).     [provider]  Mirabegron (MYRBETRIQ PO) Take 1 tablet by mouth daily.    [provider]  Multiple Vitamins-Minerals (CENTRUM SILVER PO) Take 1 tablet by mouth daily.    [provider]  nitroGLYCERIN (NITROSTAT) 0.4 MG SL tablet Place 1 tablet (0.4 mg total) under the tongue every 5 (five) minutes as needed for chest pain. 07/18/18 12/06/19  Patwardhan, Reynold Bowen, MD  OVER THE COUNTER MEDICATION Apply 1 application topically daily as needed (Leg cramps). Thera Wox    [provider]  oxybutynin (DITROPAN) 5 MG tablet Take 5 mg by mouth at bedtime.     [provider]  pantoprazole (PROTONIX) 40 MG tablet Take protonix 40 mg twice daily for 2 months.  Resume once per day after that. 11/23/19   Mansouraty, Telford Nab., MD  Polyethyl Glycol-Propyl Glycol (SYSTANE) 0.4-0.3 % SOLN Place 1 drop into both eyes daily as needed (Dry eye).    [provider]  polyethylene glycol (MIRALAX / GLYCOLAX) 17 g packet Take 17 g by mouth daily as needed for mild constipation or moderate constipation.    [provider]  pyridOXINE (VITAMIN B-6) 100 MG tablet Take 100 mg by mouth daily.    [provider]  rosuvastatin (CRESTOR) 20 MG tablet Take 1 tablet (20 mg total) by mouth daily. 12/06/19   Patwardhan,  Manish J, MD  SYNTHROID 25 MCG tablet Take 25 mcg by mouth daily.  05/11/13   [provider]  tamsulosin (FLOMAX) 0.4 MG CAPS capsule Take 0.4 mg by mouth every evening.  02/16/13   [provider]  TOUJEO SOLOSTAR 300 UNIT/ML SOPN Inject 42 Units into the skin at bedtime.  07/31/17   [provider]  triamcinolone cream (KENALOG) 0.1 % Apply 1 application topically at bedtime.    [provider]    Family History Family History  Problem Relation Age of Onset   Colon cancer Father    Heart disease Mother    Diabetes Mother    Lung cancer Brother    Colon cancer Maternal Grandfather    Heart Problems Maternal Grandfather    Prostate cancer Maternal Grandfather    Cancer Maternal Grandfather        right eye   Heart attack Maternal Grandfather    Diabetes Maternal Grandmother    Heart attack Maternal Grandmother    Diabetes Sister    Colon cancer Brother 7   Stomach cancer Neg Hx    Ulcerative colitis Neg Hx    Esophageal cancer Neg Hx     Social History Social History   Tobacco Use   Smoking status: Never   Smokeless tobacco: Never  Vaping Use   Vaping Use: Never used  Substance Use Topics   Alcohol use: No   Drug use: No     Allergies    Carbidopa-levodopa   Review of Systems Review of Systems Per HPI  Physical Exam Triage Vital Signs ED Triage Vitals  Enc Vitals Group     BP 10/28/20 1631 103/70     Pulse Rate 10/28/20 1631 82     Resp 10/28/20 1631 16     Temp 10/28/20 1631 98.9 F (37.2 C)     Temp Source 10/28/20 1631 Oral     SpO2 10/28/20 1631 94 %     Weight --      Height --      Head Circumference --      Peak Flow --      Pain Score 10/28/20 1630 10     Pain Loc --      Pain Edu? --      Excl. in [place]? --    No data found.  Updated Vital Signs BP 103/70 (BP Location: Right Arm)   Pulse 82   Temp 98.9 F (37.2 C) (Oral)   Resp 16   SpO2 94%   Visual Acuity Right Eye Distance:   Left Eye Distance:   Bilateral Distance:    Right Eye Near:   Left Eye Near:    Bilateral Near:     Physical Exam Vitals and nursing note reviewed.  Constitutional:      Appearance: Normal appearance.  HENT:     Head: Atraumatic.     Mouth/Throat:     Mouth: Mucous membranes are moist.  Eyes:     Extraocular Movements: Extraocular movements intact.     Conjunctiva/sclera: Conjunctivae normal.  Cardiovascular:     Rate and Rhythm: Normal rate and regular rhythm.     Heart sounds: Normal heart sounds.  Pulmonary:     Effort: Pulmonary effort is normal.     Breath sounds: Normal breath sounds.  Abdominal:     General: Bowel sounds are normal. There is no distension.     Palpations: Abdomen is soft.     Tenderness: There is no  abdominal tenderness. There is left CVA tenderness. There is no right CVA tenderness or guarding.  Musculoskeletal:        General: Tenderness present. No swelling. Normal range of motion.     Cervical back: Normal range of motion and neck supple.     Comments: Significant left lateral rib tenderness to palpation extending toward the back.  Antalgic movements to this area.  Normal gait and range of motion of extremities.  Strength intact.  Skin:    General: Skin is warm and  dry.     Findings: No bruising or erythema.  Neurological:     General: No focal deficit present.     Mental Status: He is oriented to person, place, and time.     Motor: No weakness.     Gait: Gait normal.     Comments: All 4 extremities neurovascular intact  Psychiatric:        Mood and Affect: Mood normal.        Thought Content: Thought content normal.        Judgment: Judgment normal.   UC Treatments / Results  Labs (all labs ordered are listed, but only abnormal results are displayed) Labs Reviewed  POCT URINALYSIS DIPSTICK, ED / UC - Abnormal; Notable for the following components:      Result Value   Glucose, UA >=1000 (*)    Ketones, ur TRACE (*)    All other components within normal limits    EKG   Radiology No results found.  Procedures Procedures (including critical care time)  Medications Ordered in UC Medications  ketorolac (TORADOL) 30 MG/ML injection 30 mg (30 mg Intramuscular Given 10/28/20 1752)    Initial Impression / Assessment and Plan / UC Course  I have reviewed the triage vital signs and the nursing notes.  Pertinent labs & imaging results that were available during my care of the patient were reviewed by me and considered in my medical decision making (see chart for details).     Vital signs benign and reassuring today, exam overall very reassuring aside from point tenderness in the specific location of his pain which is also exacerbated with movement.  Given the reproducible nature of his pain suspect musculoskeletal in nature, likely rib strain.  Urinalysis negative for hemoglobin and pain is more superficial rib than flank so lower suspicion for a kidney stone.  Given benign nature of abdominal exam, low suspicion for intra-abdominal etiology.  Will trial Toradol shot today in clinic, Flexeril, heat, lidocaine patches, stretches.  Return for acutely worsening symptoms.  Final Clinical Impressions(s) / UC Diagnoses   Final diagnoses:  Left  flank pain   Discharge Instructions   None    ED Prescriptions     Medication Sig Dispense Auth. Provider   cyclobenzaprine (FLEXERIL) 5 MG tablet Take 1 tablet (5 mg total) by mouth 2 (two) times daily as needed for muscle spasms. 10 tablet Volney American, [place]      PDMP not reviewed this encounter.   Merrie [place], [place] 11/01/20 [phone]

## 2020-11-07 ENCOUNTER — Encounter: Payer: Self-pay | Admitting: Neurology

## 2020-11-07 ENCOUNTER — Other Ambulatory Visit: Payer: Self-pay

## 2020-11-07 ENCOUNTER — Ambulatory Visit (INDEPENDENT_AMBULATORY_CARE_PROVIDER_SITE_OTHER): Payer: HMO | Admitting: Neurology

## 2020-11-07 VITALS — BP 110/78 | HR 86 | Ht 70.0 in | Wt 236.0 lb

## 2020-11-07 DIAGNOSIS — J342 Deviated nasal septum: Secondary | ICD-10-CM | POA: Diagnosis not present

## 2020-11-07 DIAGNOSIS — Z794 Long term (current) use of insulin: Secondary | ICD-10-CM

## 2020-11-07 DIAGNOSIS — Z77098 Contact with and (suspected) exposure to other hazardous, chiefly nonmedicinal, chemicals: Secondary | ICD-10-CM | POA: Diagnosis not present

## 2020-11-07 DIAGNOSIS — G20 Parkinson's disease: Secondary | ICD-10-CM

## 2020-11-07 DIAGNOSIS — G622 Polyneuropathy due to other toxic agents: Secondary | ICD-10-CM

## 2020-11-07 DIAGNOSIS — E113391 Type 2 diabetes mellitus with moderate nonproliferative diabetic retinopathy without macular edema, right eye: Secondary | ICD-10-CM | POA: Diagnosis not present

## 2020-11-07 DIAGNOSIS — G20A1 Parkinson's disease without dyskinesia, without mention of fluctuations: Secondary | ICD-10-CM

## 2020-11-07 DIAGNOSIS — G20C Parkinsonism, unspecified: Secondary | ICD-10-CM

## 2020-11-07 MED ORDER — CARBIDOPA-LEVODOPA 25-100 MG PO TABS: 1.0000 | ORAL_TABLET | Freq: Three times a day (TID) | ORAL | 5 refills | Status: DC

## 2020-11-07 NOTE — Patient Instructions (Addendum)
See Dr. Shah on December 13th at 1 pm  Parkinson's Disease Parkinson's disease causes problems with movements. It makes it harder for you to walk or control your body. It is a long-term condition. It gets worse over time. What are the causes? This condition is caused by a loss of brain cells called neurons. These brain cells make a chemical called dopamine, which is needed to control body movement. It is not known what causes the brain cells to die. What increases the risk? Being male. Being age 60 or older. Having family members who had Parkinson's disease. Having had an injury to the brain. Being around things that are harmful or poisonous, such as pesticides. Being very sad (depressed). What are the signs or symptoms? Symptoms of this condition can vary. The main symptoms can be seen in your movement. These include: Shaking or tremors that you cannot control. This happens while you are resting. Stiffness in your neck, arms, and legs. Trouble making small movements that are needed to button your clothing or brush your teeth. Losing facial expressions. Walking in a way that is not normal. You may walk with short, shuffling steps. Loss of balance when standing. You may sway, fall backward, or have trouble making turns. Other symptoms include: Being very sad or worried. Having false beliefs (delusions). Seeing, hearing, or feeling things that are not real. Trouble speaking or swallowing. Having a hard time pooping (constipation). Needing to pee right away, peeing often, or not being able to control when you pee or poop. Sleep problems. How is this treated? There is no cure for this disease. The goal of treatment is to manage your symptoms. Treatment may include: Medicines. Therapy to help with talking or movement. Surgery to reduce shaking and other movements that you cannot control. Follow these instructions at home: Medicines Take over-the-counter and prescription medicines only  as told by your doctor. Avoid taking pain or sleeping medicines. These medicines affect your thinking. Eating and drinking Follow instructions from your doctor about what you cannot eat or drink. Do not drink alcohol. Activity Ask your doctor if it is safe for you to drive. Do exercises as told by your doctor. Lifestyle  Put in grab bars and railings in your home, especially in the toilet and shower. These help to prevent falls. Do not smoke or use any products that contain nicotine or tobacco. If you need help quitting, ask your doctor. Join a support group. General instructions Talk with your doctor to know the type of help that you need at home. Ask your doctor about ways to stay safe. Keep all follow-up visits. These include going to see a physical therapist, speech therapist, or occupational therapist. Where to find more information National Institute of Neurological Disorders and Stroke: www.ninds.nih.gov Parkinson's Foundation: www.parkinson.org Contact a doctor if: Medicines do not help your symptoms. You keep losing your balance. You fall at home. You need more help at home. You have trouble swallowing. You have a very hard time pooping. You have a lot of side effects from your medicines. You feel very sad, worried, or confused. You see, hear, or feel things that are not real. Get help right away if: You were hurt in a fall. You cannot swallow without choking. You have chest pain or trouble breathing. You do not feel safe at home. You have thoughts about hurting yourself or other people. These symptoms may be an emergency. Get help right away. Call your local emergency services (911 in the [place] Do not   not drive yourself to the hospital. Get help right away if you feel like you may hurt yourself or others, or have thoughts about taking your own life. Go to your nearest emergency room or: Call your local emergency services (911 in the [place] Call the [facility] at (272)727-3940. This is open 24 hours a day. Text the Crisis Text Line at 814-743-2282. Summary Parkinson's disease causes problems with movements. It is a long-term condition. It gets worse over time. There is no cure. Treatment focuses on managing your symptoms. Talk with your doctor to know the type of help that you need at home. Ask your doctor about ways to stay safe. Keep all follow-up visits. This information is not intended to replace advice given to you by your health care provider. Make sure you discuss any questions you have with your health care provider. Document Revised: 04/24/2020 Document Reviewed: 04/24/2020 Elsevier Patient Education  [place]; Levodopa Tablets What is this medication? CARBIDOPA; LEVODOPA (kar bi DOE pa; lee voe DOE pa) treats the symptoms of Parkinson disease. It works by increasing the amount of dopamine in your brain, a substance which helps manage body movements and coordination. This reduces the symptoms of Parkinson, such as body stiffness and tremors. This medicine may be used for other purposes; ask your health care provider or pharmacist if you have questions. COMMON BRAND NAME(S): Atamet, Dhivy, SINEMET What should I tell my care team before I take this medication? They need to know if you have any of these conditions: Depression or other mental illness Diabetes Glaucoma Heart disease, including history of a heart attack History of irregular heartbeat Kidney disease Liver disease Lung or breathing disease, like asthma Narcolepsy Sleep apnea Stomach or intestine problems An unusual or allergic reaction to levodopa, carbidopa, other medications, foods, dyes, or preservatives Pregnant or trying to get pregnant Breast-feeding How should I use this medication? Take this medication by mouth with a glass of water. Follow the directions on the prescription label. Take your doses at regular intervals.  Do not take your medication more often than directed. Do not stop taking except on the advice of your care team. Talk to your care team about the use of this medication in children. Special care may be needed. Overdosage: If you think you have taken too much of this medicine contact a poison control center or emergency room at once. NOTE: This medicine is only for you. Do not share this medicine with others. What if I miss a dose? If you miss a dose, take it as soon as you can. If it is almost time for your next dose, take only that dose. Do not take double or extra doses. What may interact with this medication? Do not take this medication with any of the following: MAOIs like Marplan, Nardil, and Parnate Reserpine Tetrabenazine This medication may also interact with the following: Alcohol Droperidol Entacapone Iron supplements or multivitamins with iron Isoniazid, INH Linezolid Medications for depression, anxiety, or psychotic disturbances Medications for high blood pressure Medications for sleep Metoclopramide Papaverine Procarbazine Tedizolid Rasagiline Selegiline Tolcapone This list may not describe all possible interactions. Give your health care provider a list of all the medicines, herbs, non-prescription drugs, or dietary supplements you use. Also tell them if you smoke, drink alcohol, or use illegal drugs. Some items may interact with your medicine. What should I watch for while using this medication? Visit your care team for regular checks on your progress. Tell your care  team if your symptoms do not start to get better or if they get worse. Do not stop taking except on your care team's advice. You may develop a severe reaction. Your care team will tell you how much medication to take. You may experience a wearing of effect prior to the time for your next dose of this medication. You may also experience an on-off effect where the medication apparently stops working for  anything from a minute to several hours, then suddenly starts working again. Tell your care team if any of these symptoms happen to you. Your dose may need to be changed. A high protein diet can slow or prevent absorption of this medication. Avoid high protein foods near the time of taking this medication to help to prevent these problems. Take this medication at least 30 minutes before eating or one hour after meals. You may want to eat higher protein foods later in the day or in small amounts. Discuss your diet with your care team. You may get drowsy or dizzy. Do not drive, use machinery, or do anything that needs mental alertness until you know how this medication affects you. Do not stand or sit up quickly, especially if you are an older patient. This reduces the risk of dizzy or fainting spells. Alcohol may interfere with the effect of this medication. Avoid alcoholic drinks. When taking this medication, you may fall asleep without notice. You may be doing activities like driving a car, talking, or eating. You may not feel drowsy before it happens. Contact your care team right away if this happens to you. There have been reports of increased sexual urges or other strong urges such as gambling while taking this medication. If you experience any of these while taking this medication, you should report this to your care team as soon as possible. If you are diabetic, this medication may interfere with the accuracy of some tests for sugar or ketones in the urine (does not interfere with blood tests). Check with your care team before changing the dose of your diabetic medication. This medication may discolor the urine or sweat, making it look darker or red in color. This is of no cause for concern. However, this may stain clothing or fabrics. This medication may cause a decrease in vitamin B6. You should make sure that you get enough vitamin B6 while you are taking this medication. Discuss the foods you eat  and the vitamins you take with your care team. What side effects may I notice from receiving this medication? Side effects that you should report to your care team as soon as possible: Allergic reactions-skin rash, itching, hives, swelling of the face, lips, tongue, or throat Falling asleep during daily activities Heart rhythm changes-fast or irregular heartbeat, dizziness, feeling faint or lightheaded, chest pain, trouble breathing Low blood pressure-dizziness, feeling faint or lightheaded, blurry vision Mood and behavior changes-anxiety, nervousness, confusion, hallucinations, irritability, hostility, thoughts of suicide or self-harm, worsening mood, feelings of depression New or worsening uncontrolled and repetitive movements of the face, mouth, or upper body Stomach bleeding-bloody or black, tar-like stools, vomiting blood or brown material that looks like coffee grounds Sudden eye pain or change in vision such as blurry vision, seeing halos around lights, vision loss Urges to engage in impulsive behaviors such as gambling, binge eating, sexual activity, or shopping in ways that are unusual for you Side effects that usually do not require medical attention (report to your care team if they continue or are bothersome): Dark  red or black saliva, sweat, or urine Dizziness Drowsiness Headache Nausea This list may not describe all possible side effects. Call your doctor for medical advice about side effects. You may report side effects to FDA at 1-800-FDA-1088. Where should I keep my medication? Keep out of the reach of children. Store at room temperature between 15 and 30 degrees C (59 and 86 degrees F). Protect from light. Throw away any unused medication after the expiration date. NOTE: This sheet is a summary. It may not cover all possible information. If you have questions about this medicine, talk to your doctor, pharmacist, or health care provider.  2022 Elsevier/Gold Standard  (2020-03-06 12:21:07)

## 2020-11-07 NOTE — Progress Notes (Signed)
Anthony [name]  Provider:  Dr Mendel Binsfeld Referring Provider: Geoffery Lyons, NP Primary Care Physician:  Haywood Pao, MD  Chief Complaint  Patient presents with   New Patient (Initial Visit)    RM 10 w/ wife. Paper referral from Anthony Kelp, NP for worsening tremor L>R x32months to 1 year. Fine motor skills harder, balance worse. Unsure of father's medical hx, unsure if mother had tremors.     HPI:  Anthony [name] is a 75 y.o. male here as a referral from Dr. Osborne Casco , NP [facility], for a tremor evaluation.    My last visit with [name] was in 2019 August 8.  And August 27, 2016 before that.  He has been followed here since 2016 or 2015 before and was actually followed for obstructive sleep apnea and CPAP use.  Today he presents for a tremor evaluation.  Nurse practitioner Earle Gell mentioned that the patient has noted a slowly progressive tremor that affected his left dominant hand greater than his right this has been present since earlier this year maybe even a little longer but it has progressed since.   The patient also has a history of neuropathy, he does not use socks and has had swelling and redness of the skin in the lower extremities sometimes numbness in the left upper extremity as well.  He has remained compliant with his CPAP use for which she has followed up with our nurse practitioner.  However as of early October this year his tremor has become so high amplitude that the shaking of his left side has left him unable to perform certain maneuvers, affects his fine motor skills.  He also seems to run into things and bumps into objects when he walks.  He drops objects to from his dominant hand.  The patient has a history of diabetes mellitus type 2 diagnosed in 2007, hyperglycemia, basal insulin was started 2019, he has a history of hypertension angina pectoris and has followed with Dr. Virgina Jock his cardiologist.  Hyperlipidemia hypothyroidism noted in 2008 history  of gout and benign prostate hyperplasia, pancreatitis bout in March 2016.  COVID in July 2022. Eye vision loss, 2020, right eye  DM related or stroke. At [facility].    Review of Systems: Out of a complete 14 system review, the patient complains of only the following symptoms, and all other reviewed systems are negative. Tremor   Social History   Socioeconomic History   Marital status: Married    Spouse name: Helene [name]   Number of children: 2   Years of education: 12   Highest education level: Not on file  Occupational History   Occupation: [employer]    Employer: [employer]  Tobacco Use   Smoking status: Never   Smokeless tobacco: Never  Vaping Use   Vaping Use: Never used  Substance and Sexual Activity   Alcohol use: No   Drug use: No   Sexual activity: Not on file  Other Topics Concern   Not on file  Social History Narrative   Patient is married Helene [name]) and lives at home with his wife.   Patient has two children (twins).   Patient is retired.   Patient has a high school education.   Patient does not drink any caffeine.   Patient is left-handed.   Social Determinants of Health   Financial Resource Strain: Not on file  Food Insecurity: Not on file  Transportation Needs: Not on file  Physical Activity: Not on file  Stress:  Not on file  Social Connections: Not on file  Intimate Partner Violence: Not on file    Family History  Problem Relation Age of Onset   Colon cancer Father    Heart disease Mother    Diabetes Mother    Lung cancer Brother    Colon cancer Maternal Grandfather    Heart Problems Maternal Grandfather    Prostate cancer Maternal Grandfather    Cancer Maternal Grandfather        right eye   Heart attack Maternal Grandfather    Diabetes Maternal Grandmother    Heart attack Maternal Grandmother    Diabetes Sister    Colon cancer Brother 7   Stomach cancer Neg Hx    Ulcerative colitis Neg Hx    Esophageal cancer Neg Hx     Past Medical  History:  Diagnosis Date   BPH (benign prostatic hyperplasia)    Diabetes mellitus type 2, controlled ([place])    Fatty liver    GERD (gastroesophageal reflux disease)    Gout    last flare up last week right ankle    Heart disease    HTN (hypertension)    Hyperlipidemia    Hypothyroidism    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   Obesity (BMI 30.0-34.9) 05/14/2013   OSA on CPAP    cpap setting of 10/ 13   Pancreatitis dx march 2016   Pneumonia 12 years ago    Past Surgical History:  Procedure Laterality Date   BACK SURGERY  10/2009   Cervical, arterior   CARPAL TUNNEL RELEASE Left 2003   CARPAL TUNNEL RELEASE Bilateral    CATARACT EXTRACTION Bilateral 01/2012   EUS N/A 07/15/2014   Procedure: FULL UPPER ENDOSCOPIC ULTRASOUND (EUS) RADIAL;  Surgeon: Carol Ada, MD;  Location: [facility];  Service: Endoscopy;  Laterality: N/A;   LEFT HEART CATH AND CORONARY ANGIOGRAPHY N/A 05/18/2019   Procedure: LEFT HEART CATH AND CORONARY ANGIOGRAPHY;  Surgeon: Adrian Prows, MD;  Location: [place] [facility];  Service: Cardiovascular;  Laterality: N/A;   NASAL SINUS SURGERY  1981   RETINAL Bilateral 06/2013   Retinal peel   SHOULDER SURGERY Right 2003    Current Outpatient Medications  Medication Sig Dispense Refill   acetaminophen (TYLENOL) 500 MG tablet Take 500 mg by mouth every 6 (six) hours as needed.     allopurinol (ZYLOPRIM) 300 MG tablet Take 300 mg by mouth daily.      Alogliptin Benzoate 25 MG TABS Take 25 mg by mouth daily.     amLODipine (NORVASC) 5 MG tablet Take 1 tablet (5 mg total) by mouth daily. 90 tablet 3   aspirin 81 MG EC tablet TAKE 1 TABLET (81 MG TOTAL) BY MOUTH DAILY. SWALLOW WHOLE. 90 tablet 3   CAPSAICIN EX Apply 1 application topically in the morning and at bedtime.     colchicine 0.6 MG tablet Take 0.6 mg by mouth daily as needed (for gout).     cyclobenzaprine (FLEXERIL) 5 MG tablet Take 1 tablet (5 mg total) by mouth 2 (two) times daily as needed for muscle  spasms. 10 tablet 0   divalproex (DEPAKOTE) 250 MG DR tablet Take 250 mg by mouth at bedtime.     DULoxetine (CYMBALTA) 30 MG capsule Take 1 capsule by mouth daily.     empagliflozin (JARDIANCE) 10 MG TABS tablet Take 10 mg by mouth daily.      fluticasone (FLONASE) 50 MCG/ACT nasal spray Place 1 spray into both nostrils daily. [place]  g 0   furosemide (LASIX) 40 MG tablet Take 40 mg by mouth daily.     gabapentin (NEURONTIN) 100 MG capsule Take 100 mg by mouth daily.     gabapentin (NEURONTIN) 600 MG tablet Take 600 mg by mouth 3 (three) times daily.     ibuprofen (ADVIL) 200 MG tablet Take 200-400 mg by mouth as needed. Three times a week     isosorbide mononitrate (IMDUR) 60 MG 24 hr tablet Take 1 tablet (60 mg total) by mouth daily. 90 tablet 3   ketoconazole (NIZORAL) 2 % cream Apply 1 application topically daily as needed for irritation (yeast irritation).      lisinopril (PRINIVIL,ZESTRIL) 40 MG tablet Take 40 mg by mouth daily.      metoprolol succinate (TOPROL-XL) 50 MG 24 hr tablet Take 1 tablet (50 mg total) by mouth daily. Take with or immediately following a meal. 90 tablet 3   metroNIDAZOLE (METROGEL) 0.75 % gel Apply 1 application topically daily as needed (Skin rash on face).      Mirabegron (MYRBETRIQ PO) Take 1 tablet by mouth daily.     Multiple Vitamins-Minerals (CENTRUM SILVER PO) Take 1 tablet by mouth daily.     OVER THE COUNTER MEDICATION Apply 1 application topically daily as needed (Leg cramps). Thera Wox     oxybutynin (DITROPAN) 5 MG tablet Take 5 mg by mouth at bedtime.     pantoprazole (PROTONIX) 40 MG tablet Take protonix 40 mg twice daily for 2 months.  Resume once per day after that. 60 tablet 0   Polyethyl Glycol-Propyl Glycol (SYSTANE) 0.4-0.3 % SOLN Place 1 drop into both eyes daily as needed (Dry eye).     polyethylene glycol (MIRALAX / GLYCOLAX) 17 g packet Take 17 g by mouth daily as needed for mild constipation or moderate constipation.     pyridOXINE (VITAMIN  B-6) 100 MG tablet Take 100 mg by mouth daily.     rosuvastatin (CRESTOR) 20 MG tablet Take 1 tablet (20 mg total) by mouth daily. 90 tablet 3   SYNTHROID 25 MCG tablet Take 25 mcg by mouth daily.      tamsulosin (FLOMAX) 0.4 MG CAPS capsule Take 0.4 mg by mouth every evening.      TOUJEO SOLOSTAR 300 UNIT/ML SOPN Inject 42 Units into the skin at bedtime.   3   triamcinolone cream (KENALOG) 0.1 % Apply 1 application topically at bedtime.     nitroGLYCERIN (NITROSTAT) 0.4 MG SL tablet Place 1 tablet (0.4 mg total) under the tongue every 5 (five) minutes as needed for chest pain. 30 tablet 3   No current facility-administered medications for this visit.    Allergies as of 11/07/2020 - Review Complete 10/28/2020  Allergen Reaction Noted   Carbidopa-levodopa Other (See Comments) 06/28/2014    Vitals: BP 110/78 (BP Location: Right Arm, Patient Position: Sitting, Cuff Size: Normal)   Pulse 86   Ht 5\' 10"  (1.778 m)   Wt 236 lb (107 kg)   SpO2 96%   BMI 33.86 kg/m  Last Weight:  Wt Readings from Last 1 Encounters:  11/07/20 236 lb (107 kg)   Last Height:   Ht Readings from Last 1 Encounters:  11/07/20 5\' 10"  (1.778 m)   Vision Screening:  Left eye with correction 20/ 70 with clouding of the right outer field.    Right eye with correction 20/400.  Everything is wavy, like a film .   Physical exam:  General: The patient is awake, alert  and appears not in acute distress. The patient is well groomed. Head: Normocephalic, atraumatic. Neck is supple. Mallampati 3, neck circumference:21 Cardiovascular:  Regular rate and rhyth, without  murmurs or carotid bruit, and without distended neck veins. Respiratory: Lungs are clear to auscultation. Skin:  Without evidence of edema, or rash Trunk: BMI is 33. 6. elevated and patient  has normal posture.   Neurologic exam : The patient is awake and alert, oriented to place and time.  Memory subjective  described as intact. There is a normal  attention span & concentration ability. Speech is fluent without  dysarthria, mild dysphonia . Wife reports mild aphasia. Mood and affect are appropriate.  Cranial nerves: Pupils are equal and briskly reactive to light. Extraocular movements  in vertical and horizontal planes intact and without nystagmus. Hearing to finger rub impaired   Facial sensation intact to fine touch.  Facial motor strength is symmetric and tongue and uvula move midline.  Motor exam:   Normal tone and normal muscle bulk and symmetric normal strength in all extremities.  Sensory:  Fine touch, pinprick and vibration were decreased in all extremities. Proprioception is tested in the upper extremities only. This was normal.  Coordination: Rapid alternating movements in the fingers/hands is left side slowed, and abnormal.  Finger-to-nose maneuver tested : slowed with evidence of ataxia, dysmetria and tremor on the left over right .  Left sided pill rolling tremor, large amplitude.  See attached tremogram.   Gait and station: Patient walks without assistive device , bare feet, and is able to provide a  normal Stance is . Tandem gait is impaired. He is walking slightly stooped, turning with 4 steps, unsafe turning. He has reduced arm swing in both arms, but more left, and his pill rolling tremor continued. , turns with 4 Steps are fragmented. Romberg testing is abnormal.  Deep tendon reflexes: in the  upper extremities are symmetric and intact. Lower extremities - not even a trace of DTR.  Babinski maneuver response is equivocal. .   Assessment:  After physical and neurologic examination, review of laboratory studies, imaging, neurophysiology testing and pre-existing records, assessment :   1) parkinsonian tremor, left over right, affecting dominant side.  Affecting dysmetria and and gait.   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   3) off balance - Not falling but running  into things- has significant vision impairment and visual field cut, unsafe to drive.    4) PTSD and OSA, followed here.    Plan:  Treatment plan and additional workup :  MRI I will order an MRI with and without contrast if the pain patient's kidney function allows this.  And if not we will do a noncontrast MRI.  I would also consider a DaTscan DAT scan in the near future.  I would like for the patient to start a trial of carbidopa levodopa 25/103 times a day.  Should he respond to this nausea or cannot tolerate the medication we will try it at twice a day.  My goal is to have the patient report if his tremor is improved and for how long after medication intake.  Carbidopa-levodopa allows for a upward titration.  Would definitely want him to have a nerve conduction study with EMG unless it was already done in the [place].  This is important also because his agent orange exposure can be related to his diabetes, his neuropathy and likely to his Parkinson -Disorder.  Rv in  3-4 month.

## 2020-11-08 ENCOUNTER — Telehealth: Payer: Self-pay | Admitting: Neurology

## 2020-11-08 ENCOUNTER — Telehealth: Payer: Self-pay

## 2020-11-08 LAB — COMPREHENSIVE METABOLIC PANEL
ALT: 35 IU/L (ref 0–44)
AST: 38 IU/L (ref 0–40)
Albumin/Globulin Ratio: 1.5 (ref 1.2–2.2)
Albumin: 4.5 g/dL (ref 3.7–4.7)
Alkaline Phosphatase: 111 IU/L (ref 44–121)
BUN/Creatinine Ratio: 15 (ref 10–24)
BUN: 15 mg/dL (ref 8–27)
Bilirubin Total: 0.3 mg/dL (ref 0.0–1.2)
CO2: 22 mmol/L (ref 20–29)
Calcium: 10.2 mg/dL (ref 8.6–10.2)
Chloride: 99 mmol/L (ref 96–106)
Creatinine, Ser: 1.01 mg/dL (ref 0.76–1.27)
Globulin, Total: 3.1 g/dL (ref 1.5–4.5)
Glucose: 87 mg/dL (ref 70–99)
Potassium: 5.9 mmol/L — ABNORMAL HIGH (ref 3.5–5.2)
Sodium: 139 mmol/L (ref 134–144)
Total Protein: 7.6 g/dL (ref 6.0–8.5)
eGFR: 78 mL/min/{1.73_m2} (ref >59)

## 2020-11-08 NOTE — Progress Notes (Signed)
Very high potassium level- normal renal function and liver function. I question if this could be the result of hemolytic artefact. Will share with Dr Loren Racer office.  Allowed to proceed to MRI

## 2020-11-08 NOTE — Progress Notes (Signed)
See telephone note from 11/08/20.

## 2020-11-08 NOTE — Telephone Encounter (Signed)
-----   Message from Larey Seat, MD sent at 11/08/2020  8:43 AM EDT ----- Very high potassium level- normal renal function and liver function. I question if this could be the result of hemolytic artefact. Will share with Dr Loren Racer office.  Allowed to proceed to MRI

## 2020-11-08 NOTE — Telephone Encounter (Signed)
Called and relayed results per Dr. Brett Fairy.   Informed pt that once insurance approves his MRI he will be getting a call to set up his appt. Pt was appreciative of the call and had no further questions at this time.

## 2020-11-08 NOTE — Telephone Encounter (Signed)
Authorization request for MRI brain w/wo contrast submitted to [employer]. Pending. Phone: [phone].

## 2020-11-09 NOTE — Telephone Encounter (Signed)
[employer] auth: [id number] (exp. 11/10/20 to 02/08/21) order sent to GI, they will reach out to the patient to schedule.

## 2020-11-14 ENCOUNTER — Other Ambulatory Visit: Payer: Self-pay | Admitting: Cardiology

## 2020-11-14 DIAGNOSIS — E782 Mixed hyperlipidemia: Secondary | ICD-10-CM

## 2020-11-14 DIAGNOSIS — I25118 Atherosclerotic heart disease of native coronary artery with other forms of angina pectoris: Secondary | ICD-10-CM

## 2020-11-21 ENCOUNTER — Ambulatory Visit
Admission: RE | Admit: 2020-11-21 | Discharge: 2020-11-21 | Disposition: A | Discharge status: Home / Self Care | Payer: HMO | Source: Ambulatory Visit | Attending: Neurology | Admitting: Neurology

## 2020-11-21 ENCOUNTER — Other Ambulatory Visit: Payer: Self-pay

## 2020-11-21 DIAGNOSIS — G20 Parkinson's disease: Secondary | ICD-10-CM | POA: Diagnosis not present

## 2020-11-21 DIAGNOSIS — G20A1 Parkinson's disease without dyskinesia, without mention of fluctuations: Secondary | ICD-10-CM

## 2020-11-21 MED ORDER — GADOBENATE DIMEGLUMINE 529 MG/ML IV SOLN
20.0000 mL | Freq: Once | INTRAVENOUS | Status: AC | PRN
  Administered 2020-11-21: 20 mL via INTRAVENOUS

## 2020-11-23 NOTE — Progress Notes (Signed)
IMPRESSION:   Unremarkable MRI brain with and without contrast.  No acute findings.

## 2020-12-06 ENCOUNTER — Ambulatory Visit: Payer: HMO | Admitting: Cardiology

## 2020-12-06 ENCOUNTER — Other Ambulatory Visit: Payer: Self-pay

## 2020-12-06 ENCOUNTER — Encounter: Payer: Self-pay | Admitting: Cardiology

## 2020-12-06 VITALS — BP 92/57 | HR 68 | Temp 97.6°F | Resp 17 | Ht 70.0 in | Wt 237.0 lb

## 2020-12-06 DIAGNOSIS — I25118 Atherosclerotic heart disease of native coronary artery with other forms of angina pectoris: Secondary | ICD-10-CM | POA: Diagnosis not present

## 2020-12-06 DIAGNOSIS — E875 Hyperkalemia: Secondary | ICD-10-CM | POA: Insufficient documentation

## 2020-12-06 DIAGNOSIS — E782 Mixed hyperlipidemia: Secondary | ICD-10-CM | POA: Diagnosis not present

## 2020-12-06 DIAGNOSIS — I10 Essential (primary) hypertension: Secondary | ICD-10-CM

## 2020-12-06 DIAGNOSIS — E119 Type 2 diabetes mellitus without complications: Secondary | ICD-10-CM

## 2020-12-06 MED ORDER — AMLODIPINE BESYLATE 5 MG PO TABS: 5.0000 mg | ORAL_TABLET | Freq: Every day | ORAL | 3 refills | Status: DC

## 2020-12-06 MED ORDER — LISINOPRIL 20 MG PO TABS: 40.0000 mg | ORAL_TABLET | Freq: Every day | ORAL | 3 refills | Status: DC

## 2020-12-06 MED ORDER — AMLODIPINE BESYLATE 2.5 MG PO TABS: 2.5000 mg | ORAL_TABLET | Freq: Every day | ORAL | 3 refills | Status: DC

## 2020-12-06 NOTE — Progress Notes (Signed)
Patient referred by Haywood Pao, MD for chest pain  Subjective:   Anthony [name], male    DOB: 06/29/1945, 75 y.o.   MRN: [id number]   Chief Complaint  Patient presents with   Follow-up    1 year   Coronary Artery Disease   75 y.o. Caucasian male with hypertension, hyperlipidemia, mod nonobstructive CAD, type 2 DM, OSA on CPAP, hypothyroidism, GERD  Given patient's worsening angina symptoms and prior abnormal stress test, he underwent coronary angiography on 05/18/2019.  This showed moderate nonobstructive coronary artery disease with suspicion for endothelial dysfunction.  Patient's anginal symptoms have been well controlled with current regimen. Reports 2 episodes of dyspnea on exertion that prompted him to take sublingual NTG, which quickly resolved his symptoms. He has had no chest pain over the last one year. Patient's primary concern today is lightheadedness intermittent throughout the day over the last several weeks. Patient reports no identifiable triggers.   Current Outpatient Medications on File Prior to Visit  Medication Sig Dispense Refill   acetaminophen (TYLENOL) 500 MG tablet Take 500 mg by mouth every 6 (six) hours as needed.     allopurinol (ZYLOPRIM) 300 MG tablet Take 300 mg by mouth daily.      Alogliptin Benzoate 25 MG TABS Take 25 mg by mouth daily.     aspirin 81 MG EC tablet TAKE 1 TABLET (81 MG TOTAL) BY MOUTH DAILY. SWALLOW WHOLE. 90 tablet 3   CAPSAICIN EX Apply 1 application topically in the morning and at bedtime.     carbidopa-levodopa (SINEMET) 25-100 MG tablet Take 1 tablet by mouth 3 (three) times daily. 90 tablet 5   colchicine 0.6 MG tablet Take 0.6 mg by mouth daily as needed (for gout).     cyclobenzaprine (FLEXERIL) 5 MG tablet Take 1 tablet (5 mg total) by mouth 2 (two) times daily as needed for muscle spasms. 10 tablet 0   divalproex (DEPAKOTE) 250 MG DR tablet Take 250 mg by mouth at bedtime.     DULoxetine (CYMBALTA) 30 MG capsule  Take 1 capsule by mouth daily.     empagliflozin (JARDIANCE) 10 MG TABS tablet Take 10 mg by mouth daily.      esomeprazole (NEXIUM) 40 MG capsule TAKE ONE CAPSULE BY MOUTH TWICE A DAY FOR ACID REFLUX.(TAKE ON AN EMPTY STOMACH 30 MINUTES PRIOR TO A MEAL)(APPROVED)     fluticasone (FLONASE) 50 MCG/ACT nasal spray Place 1 spray into both nostrils daily. 16 g 0   furosemide (LASIX) 40 MG tablet Take 40 mg by mouth daily.     gabapentin (NEURONTIN) 100 MG capsule Take 100 mg by mouth daily.     gabapentin (NEURONTIN) 600 MG tablet Take 600 mg by mouth 3 (three) times daily.     ibuprofen (ADVIL) 200 MG tablet Take 200-400 mg by mouth as needed. Three times a week     isosorbide mononitrate (IMDUR) 60 MG 24 hr tablet Take 1 tablet (60 mg total) by mouth daily. 90 tablet 3   ketoconazole (NIZORAL) 2 % cream Apply 1 application topically daily as needed for irritation (yeast irritation).      metoprolol succinate (TOPROL-XL) 50 MG 24 hr tablet TAKE 1 TABLET BY MOUTH DAILY. TAKE WITH OR IMMEDIATELY FOLLOWING A MEAL. 90 tablet 3   metroNIDAZOLE (METROGEL) 0.75 % gel Apply 1 application topically daily as needed (Skin rash on face).      Mirabegron (MYRBETRIQ PO) Take 1 tablet by mouth daily.  Multiple Vitamins-Minerals (CENTRUM SILVER PO) Take 1 tablet by mouth daily.     nitroGLYCERIN (NITROSTAT) 0.4 MG SL tablet Place 1 tablet (0.4 mg total) under the tongue every 5 (five) minutes as needed for chest pain. 30 tablet 3   OVER THE COUNTER MEDICATION Apply 1 application topically daily as needed (Leg cramps). Thera Wox     oxybutynin (DITROPAN) 5 MG tablet Take 5 mg by mouth at bedtime.     pantoprazole (PROTONIX) 40 MG tablet Take protonix 40 mg twice daily for 2 months.  Resume once per day after that. 60 tablet 0   Polyethyl Glycol-Propyl Glycol (SYSTANE) 0.4-0.3 % SOLN Place 1 drop into both eyes daily as needed (Dry eye).     polyethylene glycol (MIRALAX / GLYCOLAX) 17 g packet Take 17 g by mouth  daily as needed for mild constipation or moderate constipation.     pyridOXINE (VITAMIN B-6) 100 MG tablet Take 100 mg by mouth daily.     rosuvastatin (CRESTOR) 20 MG tablet TAKE 1 TABLET BY MOUTH EVERY DAY 90 tablet 3   SYNTHROID 25 MCG tablet Take 25 mcg by mouth daily.      tamsulosin (FLOMAX) 0.4 MG CAPS capsule Take 0.4 mg by mouth every evening.      TOUJEO SOLOSTAR 300 UNIT/ML SOPN Inject 42 Units into the skin at bedtime.   3   triamcinolone cream (KENALOG) 0.1 % Apply 1 application topically at bedtime.     No current facility-administered medications on file prior to visit.    Cardiovascular studies: EKG 12/06/2020: Sinus rhythm at a rate of 66 bpm.  Normal axis.  Normal EKG.  EKG 12/06/2019: Sinus rhythm 61 bpm Normal EKG  Coronary angiography 05/18/2019: Moderate diffuse ectasia involving specifically the LAD and also the right coronary artery with diffuse moderate amount of calcification, 50% stepdown post ectatic segment, slow flow which improved with intracoronary nitroglycerin to brisk flow. 50% mid ramus intermediate disease again mild ectasia noted. Recommendation: Patient already on isosorbide mononitrate, I will add amlodipine 5 mg daily.  Weight loss, exercise to improve endothelial function and control of diabetes recommended.  50 mill contrast utilized.  Lexiscan Myoview stress test 08/05/2018: Lexiscan stress test was performed. Stress EKG is non-diagnostic, as this is pharmacological stress test. SPECT imaging reveals small sized, mild intensity, reversible perfusion defect in mid to basal inferior/inferolateral myocardium, suggesting possible LCx/PDA territory ischemia. LVEF 62%. Low risk study.   Echocardiogram 07/23/2018:  1. The left ventricle has normal systolic function, with an ejection fraction of 55-60%. The cavity size was normal. Left ventricular diastolic Doppler parameters are consistent with impaired relaxation.  2. The right ventricle has normal  systolic function. The cavity was normal. There is no increase in right ventricular wall thickness.  3. The aortic valve is tricuspid. Mild calcification of the aortic valve. Trace aortic stenosis. of the aortic valve.  4. No other significant valvular abnormalities seen.  CT Chest 07/14/2018: 1. No evidence of pulmonary embolism, although evaluation of the segmental pulmonary arteries is limited due to motion artifact. 2. Diffuse mosaic attenuation of the lungs, nonspecific, but possibly reflecting small airways disease. 3.  Aortic atherosclerosis (ICD10-I70.0).  Recent labs: 11/07/2020: Glucose 87, BUN/Cr 15/1.01. EGFR 78. Na/K 139/5.9. Rest of the CMP normal  08/17/2019: Glucose 108, BUN/Cr 22/1.07. EGFR >60. Na/K 136/4.3.  H/H 14/46. MCV 89. Platelets 290  05/18/2019:  BUN/Cr 17/1.17.  Na/K 138/4.1. Glucose 81. EGFR 60. Rest of the CMP normal H/H 14.9/. MCV 86. Platelets  388.  04/26/2019: Chol 147, TG 149, HDL 37, LDL 84  03/23/2019: A1C 6.700 % TSH 1.77   Review of Systems  Cardiovascular:  Positive for dyspnea on exertion (2 brief episodes). Negative for chest pain, leg swelling, palpitations and syncope.        Vitals:   12/06/20 1020  BP: (!) 92/57  Pulse: 68  Resp: 17  Temp: 97.6 F (36.4 C)  SpO2: 95%     Body mass index is 34.01 kg/m. Filed Weights   12/06/20 1020  Weight: 237 lb (107.5 kg)     Objective:   Physical Exam Vitals reviewed.  Constitutional:      Appearance: He is well-developed.  Neck:     Vascular: No JVD.  Cardiovascular:     Rate and Rhythm: Normal rate and regular rhythm.     Pulses: Intact distal pulses.     Heart sounds: Normal heart sounds. No murmur heard. Pulmonary:     Effort: Pulmonary effort is normal.     Breath sounds: Normal breath sounds. No wheezing or rales.   Physical exam unchanged compared to previous office visit.       Assessment & Recommendations:   75 y.o. Caucasian male with hypertension,  hyperlipidemia, mod nonobstructive CAD, type 2 DM, OSA on CPAP, hypothyroidism, GERD  CAD: Moderate nonobstructive disease with possible endothelial dysfunction. Patient's symptoms have been well controlled. Continue aspirin, statin, metoprolol, amlodipine, Imdur. Due to soft blood pressure and lightheadedness along with hyperkalemia will reduce lisinopril from 40 mg to 20 mg daily.  Check lipid panel  Hyperkalemia: K 5.9 on 11/07/2020 He is on lisinopril 40 mg, not on K sparing diuretics/K supplement. Given soft blood pressure and lightheadedness along with hyperkalemia will reduce lisinopril from 40 mg to 20 mg daily.  Repeat BMP in 2 weeks, along with lipid panel as above  Hypertension: Blood pressure is soft and patient symptomatic.  Reduce lisinopril from 40 mg to 20 mg daily.   Type 2 diabetes mellitus without complication: Continue current management, including Jardiance.  Mixed hyperlipidemia: Check lipid panel  F/u in 6 weeks for hypotension and hyperkalemia.    Alethia Berthold, PA-C 12/06/2020, 12:45 PM Office: [phone]

## 2020-12-18 DIAGNOSIS — I10 Essential (primary) hypertension: Secondary | ICD-10-CM | POA: Diagnosis not present

## 2020-12-18 DIAGNOSIS — E782 Mixed hyperlipidemia: Secondary | ICD-10-CM | POA: Diagnosis not present

## 2020-12-19 LAB — BASIC METABOLIC PANEL
BUN/Creatinine Ratio: 13 (ref 10–24)
BUN: 14 mg/dL (ref 8–27)
CO2: 27 mmol/L (ref 20–29)
Calcium: 9.7 mg/dL (ref 8.6–10.2)
Chloride: 100 mmol/L (ref 96–106)
Creatinine, Ser: 1.1 mg/dL (ref 0.76–1.27)
Glucose: 107 mg/dL — ABNORMAL HIGH (ref 70–99)
Potassium: 4.6 mmol/L (ref 3.5–5.2)
Sodium: 139 mmol/L (ref 134–144)
eGFR: 70 mL/min/{1.73_m2} (ref >59)

## 2020-12-19 LAB — LIPID PANEL WITH LDL/HDL RATIO
Cholesterol, Total: 145 mg/dL (ref 100–199)
HDL: 33 mg/dL — ABNORMAL LOW (ref >39)
LDL Chol Calc (NIH): 79 mg/dL (ref 0–99)
LDL/HDL Ratio: 2.4 ratio (ref 0.0–3.6)
Triglycerides: 197 mg/dL — ABNORMAL HIGH (ref 0–149)
VLDL Cholesterol Cal: 33 mg/dL (ref 5–40)

## 2020-12-27 ENCOUNTER — Ambulatory Visit (INDEPENDENT_AMBULATORY_CARE_PROVIDER_SITE_OTHER): Payer: HMO | Admitting: Neurology

## 2020-12-27 ENCOUNTER — Encounter (INDEPENDENT_AMBULATORY_CARE_PROVIDER_SITE_OTHER): Payer: HMO | Admitting: Neurology

## 2020-12-27 DIAGNOSIS — Z77098 Contact with and (suspected) exposure to other hazardous, chiefly nonmedicinal, chemicals: Secondary | ICD-10-CM

## 2020-12-27 DIAGNOSIS — L814 Other melanin hyperpigmentation: Secondary | ICD-10-CM | POA: Diagnosis not present

## 2020-12-27 DIAGNOSIS — M5412 Radiculopathy, cervical region: Secondary | ICD-10-CM

## 2020-12-27 DIAGNOSIS — G622 Polyneuropathy due to other toxic agents: Secondary | ICD-10-CM

## 2020-12-27 DIAGNOSIS — G20 Parkinson's disease: Secondary | ICD-10-CM

## 2020-12-27 DIAGNOSIS — L821 Other seborrheic keratosis: Secondary | ICD-10-CM | POA: Diagnosis not present

## 2020-12-27 DIAGNOSIS — R200 Anesthesia of skin: Secondary | ICD-10-CM | POA: Diagnosis not present

## 2020-12-27 DIAGNOSIS — M5416 Radiculopathy, lumbar region: Secondary | ICD-10-CM

## 2020-12-27 DIAGNOSIS — Z0289 Encounter for other administrative examinations: Secondary | ICD-10-CM

## 2020-12-27 DIAGNOSIS — E113391 Type 2 diabetes mellitus with moderate nonproliferative diabetic retinopathy without macular edema, right eye: Secondary | ICD-10-CM

## 2020-12-27 DIAGNOSIS — D225 Melanocytic nevi of trunk: Secondary | ICD-10-CM | POA: Diagnosis not present

## 2020-12-27 DIAGNOSIS — G629 Polyneuropathy, unspecified: Secondary | ICD-10-CM

## 2020-12-27 DIAGNOSIS — G20C Parkinsonism, unspecified: Secondary | ICD-10-CM

## 2020-12-27 DIAGNOSIS — L570 Actinic keratosis: Secondary | ICD-10-CM | POA: Diagnosis not present

## 2020-12-27 DIAGNOSIS — G5622 Lesion of ulnar nerve, left upper limb: Secondary | ICD-10-CM

## 2020-12-27 NOTE — Progress Notes (Signed)
Full Name: Anthony [name] Gender: Male MRN #: [id number] Date of Birth: 05-06-1945    Visit Date: 12/27/2020 13:17 Age: 75 Years Examining Physician: Arlice Colt, MD  Referring Physician: Larey Seat, MD Height: 5 feet 10 inch Patient History: 237lbs    History: Anthony [name] is a 75 year old [name] with numbness below his knees and tremor in the left hand.  He has a history of C6-C7 cervical fusion followed by C5-C6 fusion.  He also reports sciatica type pain down the right leg but not the left leg.  He has had left ulnar nerve transposition and bilateral carpal tunnel release.  Nerve conduction study of the foot in 2015 was normal.  Nerve conduction studies: Bilateral tibial and peroneal motor responses had normal distal latencies, amplitudes and conduction velocities.  The tibial F-wave latencies were normal.  Bilateral sural and superficial peroneal sensory responses had slightly reduced amplitudes with normal peak latencies.  The galvanic skin response was absent in the left foot and left palm.  Electromyography: Needle EMG of selected muscles of the left arm and right leg was performed.  In the left arm, there was chronic denervation in the C7 innervated muscles and in the ulnar innervated first dorsal interosseous muscle.  Some polyphasic motor units with normal recruitment was noted in the C5/C6 innervated muscles.  In the right leg, there was mild chronic elevation in the L5 and S1 innervated muscles.  Of note, the adductor hallucis  in the foot was not affected out of proportion to the other similarly innervated muscles.  There was no abnormal spontaneous activity in any of the muscles tested.  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  The large fibers appear normal for age. Left C7 chronic radiculopathy Denervation in the first dorsal interosseous muscle of  the left arm is consistent with his known history of ulnar neuropathy L5 and S1 chronic radiculopathy   Anthony [name] A. Felecia Shelling, MD, PhD, FAAN Certified in Neurology, Clinical Neurophysiology, Sleep Medicine, Pain Medicine and Neuroimaging Director, [facility] [address] [place], [place] [address] [phone]  Verbal informed consent was obtained from the patient, patient was informed of potential risk of procedure, including bruising, bleeding, hematoma formation, infection, muscle weakness, muscle pain, numbness, among others.        [name]    Nerve / Sites Muscle Latency Ref. Amplitude Ref. Rel Amp Segments Distance Velocity Ref. Area    ms ms mV mV %  cm m/s m/s mVms  R Peroneal - EDB     Ankle EDB 4.7 ?6.5 5.8 ?2.0 100 Ankle - EDB 9   18.3     Fib head EDB 11.6  4.2  73.3 Fib head - Ankle 31 45 ?44 16.0     Pop fossa EDB 13.9  3.9  91.8 Pop fossa - Fib head 10 44 ?44 15.3         Pop fossa - Ankle      L Peroneal - EDB     Ankle EDB 4.3 ?6.5 6.1 ?2.0 100 Ankle - EDB 9   16.6     Fib head EDB 10.7  4.7  76.6 Fib head - Ankle 28 44 ?44 15.1     Pop fossa EDB 13.0  4.3  92.2 Pop fossa - Fib head 10 44 ?44 14.1  Pop fossa - Ankle      R Tibial - AH     Ankle AH 3.6 ?5.8 8.1 ?4.0 100 Ankle - AH 9   23.2     Pop fossa AH 11.6  6.3  77 Pop fossa - Ankle 41 51 ?41 21.6  L Tibial - AH     Ankle AH 3.5 ?5.8 5.9 ?4.0 100 Ankle - AH 9   18.4     Pop fossa AH 12.5  4.0  67.3 Pop fossa - Ankle 41 46 ?41 19.6             SSR    Nerve / Sites Latency   s  L Sympathetic - Palm     Palm NR  L Sympathetic - Foot     Foot NR          SNC    Nerve / Sites Rec. Site Peak Lat Ref.  Amp Ref. Segments Distance    ms ms V V  cm  R Sural - Ankle (Calf)     Calf Ankle 4.3 ?4.4 5 ?6 Calf - Ankle 14  L Sural - Ankle (Calf)     Calf Ankle 3.3 ?4.4 5 ?6 Calf - Ankle 14  R Superficial peroneal -  Ankle     Lat leg Ankle 4.0 ?4.4 5 ?6 Lat leg - Ankle 14  L Superficial peroneal - Ankle     Lat leg Ankle 4.0 ?4.4 5 ?6 Lat leg - Ankle 14             F  Wave    Nerve F Lat Ref.   ms ms  R Tibial - AH 55.9 ?56.0  L Tibial - AH 55.2 ?56.0         EMG Summary Table    Spontaneous MUAP Recruitment  Muscle IA Fib PSW Fasc Other Amp Dur. Poly Pattern  L. Deltoid Normal None None None _______ Normal Normal 1+ Normal  L. Triceps brachii Normal None None None _______ Increased Increased 2+ Reduced  L. Biceps brachii Normal None None None _______ Normal Normal 1+ Normal  L. Extensor digitorum communis Normal None None None _______ Increased Increased 2+ Reduced  L. First dorsal interosseous Normal None None None _______ Increased Increased 2+ Reduced  L. Abductor pollicis brevis Normal None None None _______ Normal Normal 1+ Normal  R. Vastus medialis Normal None None None _______ Normal Normal 1+ Normal  R. Tibialis anterior Normal None None None _______ Increased Increased 1+ Reduced  R. Peroneus longus Normal None None None _______ Increased Increased 1+ Reduced  R. Gastrocnemius (Medial head) Normal None None None _______ Increased Increased 1+ Reduced  R. Abductor hallucis Normal None None None _______ Normal Normal 1+ Normal  R. Iliopsoas Normal None None None _______ Normal Normal Normal Normal

## 2021-01-17 ENCOUNTER — Other Ambulatory Visit: Payer: Self-pay

## 2021-01-17 ENCOUNTER — Encounter: Payer: Self-pay | Admitting: Student

## 2021-01-17 ENCOUNTER — Ambulatory Visit: Payer: HMO | Admitting: Student

## 2021-01-17 VITALS — BP 116/62 | HR 63 | Temp 97.1°F | Resp 16 | Ht 70.0 in | Wt 243.0 lb

## 2021-01-17 DIAGNOSIS — I25118 Atherosclerotic heart disease of native coronary artery with other forms of angina pectoris: Secondary | ICD-10-CM | POA: Diagnosis not present

## 2021-01-17 DIAGNOSIS — E782 Mixed hyperlipidemia: Secondary | ICD-10-CM

## 2021-01-17 DIAGNOSIS — E875 Hyperkalemia: Secondary | ICD-10-CM | POA: Diagnosis not present

## 2021-01-17 DIAGNOSIS — I10 Essential (primary) hypertension: Secondary | ICD-10-CM

## 2021-01-17 MED ORDER — LISINOPRIL 10 MG PO TABS: 10.0000 mg | ORAL_TABLET | Freq: Every day | ORAL | 3 refills | Status: DC

## 2021-01-17 NOTE — Progress Notes (Signed)
Patient referred by Haywood Pao, MD for chest pain  Subjective:   Anthony [name], male    DOB: 1945/03/15, 75 y.o.   MRN: [id number]   Chief Complaint  Patient presents with   Hypotension   Follow-up   75 y.o. Caucasian male with hypertension, hyperlipidemia, mod nonobstructive CAD, type 2 DM, OSA on CPAP, hypothyroidism, GERD  Given patient's worsening angina symptoms and prior abnormal stress test, he underwent coronary angiography on 05/18/2019.  This showed moderate nonobstructive coronary artery disease with suspicion for endothelial dysfunction.  Patient presents for 6-week follow-up of hypertension and hyperkalemia.  At last office visit reduce lisinopril from 40 mg to 20 mg daily.  Repeat BMP showed that potassium had normalized.  Patient does continue to have episodes of lightheadedness, particularly upon standing.  He has had no recurrence of angina or dyspnea requiring sublingual nitroglycerin.  Current Outpatient Medications on File Prior to Visit  Medication Sig Dispense Refill   acetaminophen (TYLENOL) 500 MG tablet Take 500 mg by mouth every 6 (six) hours as needed.     allopurinol (ZYLOPRIM) 300 MG tablet Take 300 mg by mouth daily.      Alogliptin Benzoate 25 MG TABS Take 25 mg by mouth daily.     amLODipine (NORVASC) 5 MG tablet Take 1 tablet (5 mg total) by mouth daily. 30 tablet 3   aspirin 81 MG EC tablet TAKE 1 TABLET (81 MG TOTAL) BY MOUTH DAILY. SWALLOW WHOLE. 90 tablet 3   CAPSAICIN EX Apply 1 application topically in the morning and at bedtime.     carbidopa-levodopa (SINEMET) 25-100 MG tablet Take 1 tablet by mouth 3 (three) times daily. 90 tablet 5   colchicine 0.6 MG tablet Take 0.6 mg by mouth daily as needed (for gout).     cyclobenzaprine (FLEXERIL) 5 MG tablet Take 1 tablet (5 mg total) by mouth 2 (two) times daily as needed for muscle spasms. 10 tablet 0   divalproex (DEPAKOTE) 250 MG DR tablet Take 250 mg by mouth at bedtime.     DULoxetine  (CYMBALTA) 30 MG capsule Take 1 capsule by mouth daily.     empagliflozin (JARDIANCE) 10 MG TABS tablet Take 10 mg by mouth daily.      esomeprazole (NEXIUM) 40 MG capsule TAKE ONE CAPSULE BY MOUTH TWICE A DAY FOR ACID REFLUX.(TAKE ON AN EMPTY STOMACH 30 MINUTES PRIOR TO A MEAL)(APPROVED)     fluticasone (FLONASE) 50 MCG/ACT nasal spray Place 1 spray into both nostrils daily. 16 g 0   furosemide (LASIX) 40 MG tablet Take 40 mg by mouth daily.     gabapentin (NEURONTIN) 100 MG capsule Take 100 mg by mouth daily.     gabapentin (NEURONTIN) 600 MG tablet Take 600 mg by mouth 3 (three) times daily.     ibuprofen (ADVIL) 200 MG tablet Take 200-400 mg by mouth as needed. Three times a week     isosorbide mononitrate (IMDUR) 60 MG 24 hr tablet Take 1 tablet (60 mg total) by mouth daily. 90 tablet 3   ketoconazole (NIZORAL) 2 % cream Apply 1 application topically daily as needed for irritation (yeast irritation).      metoprolol succinate (TOPROL-XL) 50 MG 24 hr tablet TAKE 1 TABLET BY MOUTH DAILY. TAKE WITH OR IMMEDIATELY FOLLOWING A MEAL. 90 tablet 3   metroNIDAZOLE (METROGEL) 0.75 % gel Apply 1 application topically daily as needed (Skin rash on face).      Mirabegron (MYRBETRIQ PO) Take 1  tablet by mouth daily.     nitroGLYCERIN (NITROSTAT) 0.4 MG SL tablet Place 1 tablet (0.4 mg total) under the tongue every 5 (five) minutes as needed for chest pain. 30 tablet 3   OVER THE COUNTER MEDICATION Apply 1 application topically daily as needed (Leg cramps). Thera Wox     oxybutynin (DITROPAN) 5 MG tablet Take 5 mg by mouth at bedtime.     pantoprazole (PROTONIX) 40 MG tablet Take protonix 40 mg twice daily for 2 months.  Resume once per day after that. 60 tablet 0   Polyethyl Glycol-Propyl Glycol (SYSTANE) 0.4-0.3 % SOLN Place 1 drop into both eyes daily as needed (Dry eye).     polyethylene glycol (MIRALAX / GLYCOLAX) 17 g packet Take 17 g by mouth daily as needed for mild constipation or moderate  constipation.     pyridOXINE (VITAMIN B-6) 100 MG tablet Take 100 mg by mouth daily.     rosuvastatin (CRESTOR) 20 MG tablet TAKE 1 TABLET BY MOUTH EVERY DAY 90 tablet 3   SYNTHROID 25 MCG tablet Take 25 mcg by mouth daily.      tamsulosin (FLOMAX) 0.4 MG CAPS capsule Take 0.4 mg by mouth every evening.      TOUJEO SOLOSTAR 300 UNIT/ML SOPN Inject 42 Units into the skin at bedtime.   3   triamcinolone cream (KENALOG) 0.1 % Apply 1 application topically at bedtime.     No current facility-administered medications on file prior to visit.    Cardiovascular studies: EKG 12/06/2020: Sinus rhythm at a rate of 66 bpm.  Normal axis.  Normal EKG.  EKG 12/06/2019: Sinus rhythm 61 bpm Normal EKG  Coronary angiography 05/18/2019: Moderate diffuse ectasia involving specifically the LAD and also the right coronary artery with diffuse moderate amount of calcification, 50% stepdown post ectatic segment, slow flow which improved with intracoronary nitroglycerin to brisk flow. 50% mid ramus intermediate disease again mild ectasia noted. Recommendation: Patient already on isosorbide mononitrate, I will add amlodipine 5 mg daily.  Weight loss, exercise to improve endothelial function and control of diabetes recommended.  50 mill contrast utilized.  Lexiscan Myoview stress test 08/05/2018: Lexiscan stress test was performed. Stress EKG is non-diagnostic, as this is pharmacological stress test. SPECT imaging reveals small sized, mild intensity, reversible perfusion defect in mid to basal inferior/inferolateral myocardium, suggesting possible LCx/PDA territory ischemia. LVEF 62%. Low risk study.   Echocardiogram 07/23/2018:  1. The left ventricle has normal systolic function, with an ejection fraction of 55-60%. The cavity size was normal. Left ventricular diastolic Doppler parameters are consistent with impaired relaxation.  2. The right ventricle has normal systolic function. The cavity was normal. There is  no increase in right ventricular wall thickness.  3. The aortic valve is tricuspid. Mild calcification of the aortic valve. Trace aortic stenosis. of the aortic valve.  4. No other significant valvular abnormalities seen.  CT Chest 07/14/2018: 1. No evidence of pulmonary embolism, although evaluation of the segmental pulmonary arteries is limited due to motion artifact. 2. Diffuse mosaic attenuation of the lungs, nonspecific, but possibly reflecting small airways disease. 3.  Aortic atherosclerosis (ICD10-I70.0).  Recent labs: CMP Latest Ref Rng & Units 12/18/2020 11/07/2020 08/17/2019  Glucose 70 - 99 mg/dL 107(H) 87 108(H)  BUN 8 - 27 mg/dL _0 Creatinine 0.76 - 1.27 mg/dL 1.10 1.01 1.07  Sodium 134 - 144 mmol/L 139 139 136  Potassium 3.5 - 5.2 mmol/L 4.6 5.9(H) 4.3  Chloride 96 - 106 mmol/L  100 99 102  CO2 20 - 29 mmol/L _0 Calcium 8.6 - 10.2 mg/dL 9.7 10.2 9.5  Total Protein 6.0 - 8.5 g/dL - 7.6 -  Total Bilirubin 0.0 - 1.2 mg/dL - 0.3 -  Alkaline Phos 44 - 121 IU/L - 111 -  AST 0 - 40 IU/L - 38 -  ALT 0 - 44 IU/L - 35 -   CBC Latest Ref Rng & Units 08/17/2019 05/14/2019 03/17/2010  WBC 4.0 - 10.5 K/uL 11.5(H) 8.9 -  Hemoglobin 13.0 - 17.0 g/dL 14.7 14.9 15.6  Hematocrit 39.0 - 52.0 % 46.0 45.5 46.0  Platelets 150 - 400 K/uL 290 388 -   Lipid Panel     Component Value Date/Time   CHOL 145 12/18/2020 0753   TRIG 197 (H) 12/18/2020 0753   HDL 33 (L) 12/18/2020 0753   CHOLHDL 4.0 04/26/2019 0837   LDLCALC 79 12/18/2020 0753   HEMOGLOBIN A1C No results found for: HGBA1C, MPG TSH No results for input(s): TSH in the last 8760 hours.  Other Labs: 11/07/2020: Glucose 87, BUN/Cr 15/1.01. EGFR 78. Na/K 139/5.9. Rest of the CMP normal  08/17/2019: Glucose 108, BUN/Cr 22/1.07. EGFR >60. Na/K 136/4.3.  H/H 14/46. MCV 89. Platelets 290  05/18/2019:  BUN/Cr 17/1.17.  Na/K 138/4.1. Glucose 81. EGFR 60. Rest of the CMP normal H/H 14.9/. MCV 86. Platelets  388.  04/26/2019: Chol 147, TG 149, HDL 37, LDL 84  03/23/2019: A1C 6.700 % TSH 1.77  Review of Systems  Cardiovascular:  Positive for dyspnea on exertion (2 brief episodes). Negative for chest pain, leg swelling, palpitations and syncope.        Vitals:   01/17/21 0940 01/17/21 0941  BP:    Pulse:    Resp:    Temp:    SpO2: 95% 95%   Orthostatic VS for the past 72 hrs (Last 3 readings):  Orthostatic BP Patient Position BP Location Cuff Size Orthostatic Pulse  01/17/21 0941 102/60 Standing Left Arm Large 76  01/17/21 0940 114/66 Sitting Left Arm Large 68  01/17/21 0939 123/67 Supine Left Arm Large 65    Body mass index is 34.87 kg/m. Filed Weights   01/17/21 0934  Weight: 243 lb (110.2 kg)     Objective:   Physical Exam Vitals reviewed.  Constitutional:      Appearance: He is well-developed.  Neck:     Vascular: No JVD.  Cardiovascular:     Rate and Rhythm: Normal rate and regular rhythm.     Pulses: Intact distal pulses.     Heart sounds: Normal heart sounds. No murmur heard. Pulmonary:     Effort: Pulmonary effort is normal.     Breath sounds: Normal breath sounds. No wheezing or rales.   Physical exam unchanged compared to previous office visit.       Assessment & Recommendations:   75 y.o. Caucasian male with hypertension, hyperlipidemia, mod nonobstructive CAD, type 2 DM, OSA on CPAP, hypothyroidism, GERD  CAD: Moderate nonobstructive disease with possible endothelial dysfunction. Patient's symptoms have been well controlled. Continue aspirin, statin, metoprolol, amlodipine, Imdur. LDL is well controlled, however triglycerides were elevated on lipid panel.  Discussed at length with patient diet and lifestyle modifications.  He admits to poor diet compliance, however he does appear motivated to make changes.  Hyperkalemia: Potassium normalized with reduced dose of lisinopril.  Hypertension: Patient's blood pressure remains soft and he is  orthostatic.  Although patient is asymptomatic today, he does report continued episodes  of occasional lightheadedness at home. Reduce lisinopril from 20 mg to 10 mg daily Patient will continue to monitor his blood pressure and symptoms.  Patient will notify our office if blood pressure becomes uncontrolled or he continues to have episodes of lightheadedness.  Type 2 diabetes mellitus without complication: Continue current management, including Jardiance.  Mixed hyperlipidemia: LDL well controlled, triglycerides elevated. Discussed at length diet and lifestyle modifications, patient admits to poor diet compliance. Will defer repeat testing to PCP.   F/u in 6 months.    Alethia Berthold, PA-C 01/17/2021, 1:02 PM Office: [phone]

## 2021-01-21 DIAGNOSIS — G709 Myoneural disorder, unspecified: Secondary | ICD-10-CM

## 2021-01-21 HISTORY — DX: Myoneural disorder, unspecified: G70.9

## 2021-01-24 ENCOUNTER — Ambulatory Visit: Payer: HMO | Admitting: Neurology

## 2021-01-25 DIAGNOSIS — I119 Hypertensive heart disease without heart failure: Secondary | ICD-10-CM | POA: Diagnosis not present

## 2021-01-25 DIAGNOSIS — Z794 Long term (current) use of insulin: Secondary | ICD-10-CM | POA: Diagnosis not present

## 2021-01-25 DIAGNOSIS — E1140 Type 2 diabetes mellitus with diabetic neuropathy, unspecified: Secondary | ICD-10-CM | POA: Diagnosis not present

## 2021-01-25 DIAGNOSIS — I700 Atherosclerosis of aorta: Secondary | ICD-10-CM | POA: Diagnosis not present

## 2021-02-15 ENCOUNTER — Ambulatory Visit: Payer: HMO | Admitting: Adult Health

## 2021-02-15 ENCOUNTER — Encounter: Payer: Self-pay | Admitting: Adult Health

## 2021-02-15 VITALS — BP 132/71 | HR 60 | Ht 70.0 in | Wt 240.0 lb

## 2021-02-15 DIAGNOSIS — G20C Parkinsonism, unspecified: Secondary | ICD-10-CM

## 2021-02-15 DIAGNOSIS — Z9989 Dependence on other enabling machines and devices: Secondary | ICD-10-CM

## 2021-02-15 DIAGNOSIS — G20 Parkinson's disease: Secondary | ICD-10-CM

## 2021-02-15 DIAGNOSIS — R251 Tremor, unspecified: Secondary | ICD-10-CM

## 2021-02-15 DIAGNOSIS — G4733 Obstructive sleep apnea (adult) (pediatric): Secondary | ICD-10-CM

## 2021-02-15 NOTE — Progress Notes (Signed)
PATIENT: Anthony [name] DOB: [birth year]  REASON FOR VISIT: follow up HISTORY FROM: patient PRIMARY NEUROLOGIST:   HISTORY OF PRESENT ILLNESS: Today 02/15/21:  Anthony [name] is a 76 year old male with a history of obstructive sleep apnea on CPAP and tremor.  Reports that CPAP is working well.  Denies any new issues.  Tremor: Reports tremor has improved since being on Sinemet.  Currently taking 25-100 mg 3 times a day.  Denies any significant changes in his gait or balance.  States that he is not walking as much as he was since his dog passed away.  He does have some trouble swallowing food.  He reports that he did have a swallow test with the [place].  Unsure of the results.  It seems that they also recommended endoscopy this has not been completed yet.  He reports that he is also had his esophagus stretched in the past.    HISTORY (Copied from Dr.Dohmeier's note) Nurse practitioner Earle Gell mentioned that the patient has noted a slowly progressive tremor that affected his left dominant hand greater than his right this has been present since earlier this year maybe even a little longer but it has progressed since.   The patient also has a history of neuropathy, he does not use socks and has had swelling and redness of the skin in the lower extremities sometimes numbness in the left upper extremity as well.  He has remained compliant with his CPAP use for which she has followed up with our nurse practitioner.  However as of early October this year his tremor has become so high amplitude that the shaking of his left side has left him unable to perform certain maneuvers, affects his fine motor skills.  He also seems to run into things and bumps into objects when he walks.  He drops objects to from his dominant hand.  The patient has a history of diabetes mellitus type 2 diagnosed in 2007, hyperglycemia, basal insulin was started 2019, he has a history of hypertension angina pectoris and has  followed with Dr. Virgina Jock his cardiologist.  Hyperlipidemia hypothyroidism noted in 2008 history of gout and benign prostate hyperplasia, pancreatitis bout in March 2016.  COVID in July 2022. Eye vision loss, 2020, right eye  DM related or stroke. At [facility].     REVIEW OF SYSTEMS: Out of a complete 14 system review of symptoms, the patient complains only of the following symptoms, and all other reviewed systems are negative.  ALLERGIES: Allergies  Allergen Reactions   Carbidopa-Levodopa Other (See Comments)    Made skin crawl     HOME MEDICATIONS: Outpatient Medications Prior to Visit  Medication Sig Dispense Refill   acetaminophen (TYLENOL) 500 MG tablet Take 500 mg by mouth every 6 (six) hours as needed.     allopurinol (ZYLOPRIM) 300 MG tablet Take 300 mg by mouth daily.      Alogliptin Benzoate 25 MG TABS Take 25 mg by mouth daily.     amLODipine (NORVASC) 5 MG tablet Take 1 tablet (5 mg total) by mouth daily. 30 tablet 3   aspirin 81 MG EC tablet TAKE 1 TABLET (81 MG TOTAL) BY MOUTH DAILY. SWALLOW WHOLE. 90 tablet 3   CAPSAICIN EX Apply 1 application topically in the morning and at bedtime.     carbidopa-levodopa (SINEMET) 25-100 MG tablet Take 1 tablet by mouth 3 (three) times daily. 90 tablet 5   colchicine 0.6 MG tablet Take 0.6 mg by mouth daily as needed (  for gout).     cyclobenzaprine (FLEXERIL) 5 MG tablet Take 1 tablet (5 mg total) by mouth 2 (two) times daily as needed for muscle spasms. 10 tablet 0   divalproex (DEPAKOTE) 250 MG DR tablet Take 250 mg by mouth at bedtime.     DULoxetine (CYMBALTA) 30 MG capsule Take 1 capsule by mouth daily.     empagliflozin (JARDIANCE) 10 MG TABS tablet Take 10 mg by mouth daily.      esomeprazole (NEXIUM) 40 MG capsule TAKE ONE CAPSULE BY MOUTH TWICE A DAY FOR ACID REFLUX.(TAKE ON AN EMPTY STOMACH 30 MINUTES PRIOR TO A MEAL)(APPROVED)     fluticasone (FLONASE) 50 MCG/ACT nasal spray Place 1 spray into both nostrils daily. 16 g 0    furosemide (LASIX) 40 MG tablet Take 40 mg by mouth daily.     gabapentin (NEURONTIN) 100 MG capsule Take 100 mg by mouth daily.     gabapentin (NEURONTIN) 600 MG tablet Take 600 mg by mouth 3 (three) times daily.     ibuprofen (ADVIL) 200 MG tablet Take 200-400 mg by mouth as needed. Three times a week     isosorbide mononitrate (IMDUR) 60 MG 24 hr tablet Take 1 tablet (60 mg total) by mouth daily. 90 tablet 3   ketoconazole (NIZORAL) 2 % cream Apply 1 application topically daily as needed for irritation (yeast irritation).      lisinopril (ZESTRIL) 10 MG tablet Take 1 tablet (10 mg total) by mouth daily. 90 tablet 3   metoprolol succinate (TOPROL-XL) 50 MG 24 hr tablet TAKE 1 TABLET BY MOUTH DAILY. TAKE WITH OR IMMEDIATELY FOLLOWING A MEAL. 90 tablet 3   metroNIDAZOLE (METROGEL) 0.75 % gel Apply 1 application topically daily as needed (Skin rash on face).      Mirabegron (MYRBETRIQ PO) Take 1 tablet by mouth daily.     OVER THE COUNTER MEDICATION Apply 1 application topically daily as needed (Leg cramps). Thera Wox     oxybutynin (DITROPAN) 5 MG tablet Take 5 mg by mouth at bedtime.     pantoprazole (PROTONIX) 40 MG tablet Take protonix 40 mg twice daily for 2 months.  Resume once per day after that. 60 tablet 0   Polyethyl Glycol-Propyl Glycol (SYSTANE) 0.4-0.3 % SOLN Place 1 drop into both eyes daily as needed (Dry eye).     polyethylene glycol (MIRALAX / GLYCOLAX) 17 g packet Take 17 g by mouth daily as needed for mild constipation or moderate constipation.     pyridOXINE (VITAMIN B-6) 100 MG tablet Take 100 mg by mouth daily.     rosuvastatin (CRESTOR) 20 MG tablet TAKE 1 TABLET BY MOUTH EVERY DAY 90 tablet 3   SYNTHROID 25 MCG tablet Take 25 mcg by mouth daily.      tamsulosin (FLOMAX) 0.4 MG CAPS capsule Take 0.4 mg by mouth every evening.      TOUJEO SOLOSTAR 300 UNIT/ML SOPN Inject 50 Units into the skin at bedtime.  3   triamcinolone cream (KENALOG) 0.1 % Apply 1 application topically  at bedtime.     nitroGLYCERIN (NITROSTAT) 0.4 MG SL tablet Place 1 tablet (0.4 mg total) under the tongue every 5 (five) minutes as needed for chest pain. 30 tablet 3   No facility-administered medications prior to visit.    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   Obesity (BMI 30.0-34.9) 05/14/2013   OSA on CPAP    cpap setting of 10/ 13   Pancreatitis dx march 2016   Pneumonia 12 years ago    PAST SURGICAL HISTORY: Past Surgical History:  Procedure Laterality Date   BACK SURGERY  10/2009   Cervical, arterior   CARPAL TUNNEL RELEASE Left 2003   CARPAL TUNNEL RELEASE Bilateral    CATARACT EXTRACTION Bilateral 01/2012   EUS N/A 07/15/2014   Procedure: FULL UPPER ENDOSCOPIC ULTRASOUND (EUS) RADIAL;  Surgeon: Carol Ada, MD;  Location: [facility];  Service: Endoscopy;  Laterality: N/A;   LEFT HEART CATH AND CORONARY ANGIOGRAPHY N/A 05/18/2019   Procedure: LEFT HEART CATH AND CORONARY ANGIOGRAPHY;  Surgeon: Adrian Prows, MD;  Location: [facility];  Service: Cardiovascular;  Laterality: N/A;   NASAL SINUS SURGERY  1981   RETINAL Bilateral 06/2013   Retinal peel   SHOULDER SURGERY Right 2003    FAMILY HISTORY: Family History  Problem Relation Age of Onset   Heart disease Mother    Diabetes Mother    Neuropathy Mother    Colon cancer Father    Diabetes Sister    Lung cancer Brother    Colon cancer Brother 7   Diabetes Maternal Grandmother    Heart attack Maternal Grandmother    Colon cancer Maternal Grandfather    Heart Problems Maternal Grandfather    Prostate cancer Maternal Grandfather    Cancer Maternal Grandfather        right eye    Heart attack Maternal Grandfather    Stomach cancer Neg Hx    Ulcerative colitis Neg Hx    Esophageal cancer Neg Hx     SOCIAL HISTORY: Social History   Socioeconomic History   Marital status: Married    Spouse name: [name]   Number of children: 2   Years of education: 12   Highest education level: Not on file  Occupational History   Occupation: [employer]    Employer: [employer]  Tobacco Use   Smoking status: Never   Smokeless tobacco: Never  Vaping Use   Vaping Use: Never used  Substance and Sexual Activity   Alcohol use: No   Drug use: No   Sexual activity: Not on file  Other Topics Concern   Not on file  Social History Narrative   Patient is married [name]) and lives at home with his wife.   Patient has two children (twins).   Patient is retired.   Patient has a high school education.   Patient does not drink any caffeine.   Patient is left-handed.   Social Determinants of Health   Financial Resource Strain: Not on file  Food Insecurity: Not on file  Transportation Needs: Not on file  Physical Activity: Not on file  Stress: Not on file  Social Connections: Not on file  Intimate Partner Violence: Not on file      PHYSICAL EXAM  Vitals:   02/15/21 0952  BP: 132/71  Pulse: 60  Weight: 240 lb (108.9 kg)  Height: 5\' 10"  (1.778 m)   Body mass index is 34.44 kg/m.  Generalized: Well developed, in no acute distress   Neurological examination  Mentation: Alert oriented to time, place, history taking.  Follows all commands speech and language fluent Cranial nerve II-XII: Pupils were equal round reactive to light. Extraocular movements were full, visual field were full on confrontational test. Facial sensation and strength were normal. Uvula tongue midline. Head turning and shoulder shrug  were normal and symmetric. Motor: The motor testing reveals 5 over 5 strength of all 4 extremities.  Mild impairment of finger taps in the upper extremities.  Left  greater than right.  Moderate impairment of toe taps in the lower extremities. Sensory: Sensory testing is intact to soft touch on all 4 extremities. No evidence of extinction is noted.  Coordination: Cerebellar testing reveals good finger-nose-finger and heel-to-shin bilaterally.  Resting and action tremor noted in the left upper extremity. Gait and station: Decreased arm swing with ambulation.  Tandem gait not attempted.  2 steps with turns.  Romberg is negative Reflexes: Deep tendon reflexes are symmetric and normal bilaterally.   DIAGNOSTIC DATA (LABS, IMAGING, TESTING) - I reviewed patient records, labs, notes, testing and imaging myself where available.  Lab Results  Component Value Date   WBC 11.5 (H) 08/17/2019   HGB 14.7 08/17/2019   HCT 46.0 08/17/2019   MCV 89.3 08/17/2019   PLT 290 08/17/2019      Component Value Date/Time   NA 139 12/18/2020 0753   K 4.6 12/18/2020 0753   CL 100 12/18/2020 0753   CO2 27 12/18/2020 0753   GLUCOSE 107 (H) 12/18/2020 0753   GLUCOSE 108 (H) 08/17/2019 1643   BUN 14 12/18/2020 0753   CREATININE 1.10 12/18/2020 0753   CALCIUM 9.7 12/18/2020 0753   PROT 7.6 11/07/2020 1527   ALBUMIN 4.5 11/07/2020 1527   AST 38 11/07/2020 1527   ALT 35 11/07/2020 1527   ALKPHOS 111 11/07/2020 1527   BILITOT 0.3 11/07/2020 1527   GFRNONAA >60 08/17/2019 1643   GFRAA >60 08/17/2019 1643   Lab Results  Component Value Date   CHOL 145 12/18/2020   HDL 33 (L) 12/18/2020   LDLCALC 79 12/18/2020   TRIG 197 (H) 12/18/2020   CHOLHDL 4.0 04/26/2019   No results found for: HGBA1C No results found for: VITAMINB12 No results found for: TSH    ASSESSMENT AND PLAN 76 y.o. 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 here with :  1.  Obstructive sleep apnea on CPAP  Continue CPAP Good compliance and treatment of apnea Encourage patient to continue using CPAP nightly greater than 4 hours each night  2.  Tremor- parkinsons?  MRI of the brain was unremarkable. Tremor has improved with Sinemet.  Continue Sinemet 25-100 mg 3 times a day Advised to discuss swallowing test with the [place].  Also advised that he can get the results and I would be happy to review this with him. Will continue to monitor symptoms for now Patient also had nerve conduction with EMG in December.  He reports that no one ever went over these results with him.  I reviewed results with the patient and his wife     Ward Givens, MSN, NP-C 02/15/2021, 10:09 AM [facility] [address] [place], [place] [address] [phone]

## 2021-02-15 NOTE — Patient Instructions (Signed)
Your Plan:  Continue CPAP Continue Sinemet 25-100 mg three times day If your symptoms worsen or you develop new symptoms please let us know.   Thank you for coming to see [place] at [facility]. I hope we have been able to provide you high quality care today.  You may receive a patient satisfaction survey over the next few weeks. We would appreciate your feedback and comments so that we may continue to improve ourselves and the health of our patients.

## 2021-02-21 ENCOUNTER — Other Ambulatory Visit: Payer: Self-pay | Admitting: Neurology

## 2021-02-21 ENCOUNTER — Other Ambulatory Visit: Payer: Self-pay | Admitting: Cardiology

## 2021-02-21 DIAGNOSIS — I25118 Atherosclerotic heart disease of native coronary artery with other forms of angina pectoris: Secondary | ICD-10-CM

## 2021-02-21 DIAGNOSIS — I209 Angina pectoris, unspecified: Secondary | ICD-10-CM

## 2021-02-22 ENCOUNTER — Ambulatory Visit: Payer: HMO | Admitting: Adult Health

## 2021-04-09 DIAGNOSIS — N401 Enlarged prostate with lower urinary tract symptoms: Secondary | ICD-10-CM | POA: Diagnosis not present

## 2021-04-09 DIAGNOSIS — R32 Unspecified urinary incontinence: Secondary | ICD-10-CM | POA: Diagnosis not present

## 2021-04-09 DIAGNOSIS — E1140 Type 2 diabetes mellitus with diabetic neuropathy, unspecified: Secondary | ICD-10-CM | POA: Diagnosis not present

## 2021-04-09 DIAGNOSIS — E039 Hypothyroidism, unspecified: Secondary | ICD-10-CM | POA: Diagnosis not present

## 2021-04-09 DIAGNOSIS — D692 Other nonthrombocytopenic purpura: Secondary | ICD-10-CM | POA: Diagnosis not present

## 2021-04-09 DIAGNOSIS — G2581 Restless legs syndrome: Secondary | ICD-10-CM | POA: Diagnosis not present

## 2021-04-09 DIAGNOSIS — E7800 Pure hypercholesterolemia, unspecified: Secondary | ICD-10-CM | POA: Diagnosis not present

## 2021-04-09 DIAGNOSIS — G4733 Obstructive sleep apnea (adult) (pediatric): Secondary | ICD-10-CM | POA: Diagnosis not present

## 2021-04-09 DIAGNOSIS — M109 Gout, unspecified: Secondary | ICD-10-CM | POA: Diagnosis not present

## 2021-04-09 DIAGNOSIS — E669 Obesity, unspecified: Secondary | ICD-10-CM | POA: Diagnosis not present

## 2021-04-09 DIAGNOSIS — Z794 Long term (current) use of insulin: Secondary | ICD-10-CM | POA: Diagnosis not present

## 2021-06-14 ENCOUNTER — Ambulatory Visit: Payer: HMO | Admitting: Adult Health

## 2021-06-15 ENCOUNTER — Other Ambulatory Visit: Payer: Self-pay | Admitting: Neurology

## 2021-06-27 DIAGNOSIS — Z794 Long term (current) use of insulin: Secondary | ICD-10-CM | POA: Diagnosis not present

## 2021-06-27 DIAGNOSIS — I700 Atherosclerosis of aorta: Secondary | ICD-10-CM | POA: Diagnosis not present

## 2021-06-27 DIAGNOSIS — E1140 Type 2 diabetes mellitus with diabetic neuropathy, unspecified: Secondary | ICD-10-CM | POA: Diagnosis not present

## 2021-06-27 DIAGNOSIS — I119 Hypertensive heart disease without heart failure: Secondary | ICD-10-CM | POA: Diagnosis not present

## 2021-07-16 DIAGNOSIS — E1165 Type 2 diabetes mellitus with hyperglycemia: Secondary | ICD-10-CM | POA: Diagnosis not present

## 2021-07-16 NOTE — Progress Notes (Signed)
Patient referred by Haywood Pao, MD for chest pain  Subjective:   Anthony [name], male    DOB: March 23, 1945, 76 y.o.   MRN: [id number]   Chief Complaint  Patient presents with   Coronary Artery Disease   Hypertension   Hyperlipidemia   Follow-up    6 month   76 y.o. Caucasian male with hypertension, hyperlipidemia, mod nonobstructive CAD, type 2 DM, OSA on CPAP, hypothyroidism, GERD  Given patient's worsening angina symptoms and prior abnormal stress test, he underwent coronary angiography on 05/18/2019.  This showed moderate nonobstructive coronary artery disease with suspicion for endothelial dysfunction.  Patient was last seen in the office 01/17/2021 at which time due to orthostasis reduced lisinopril from 20 mg to 10 mg daily.  Patient now presents for 60-monthfollow-up.  I personally reviewed labs from November 2022, lipids are well controlled.  Patient is feeling well overall without recurrence of lightheadedness or dizziness.  On further questioning however patient reports mild chest pressure and dyspnea on exertion.   Current Outpatient Medications on File Prior to Visit  Medication Sig Dispense Refill   acetaminophen (TYLENOL) 500 MG tablet Take 500 mg by mouth every 6 (six) hours as needed.     allopurinol (ZYLOPRIM) 300 MG tablet Take 300 mg by mouth daily.      Alogliptin Benzoate 25 MG TABS Take 25 mg by mouth daily.     amLODipine (NORVASC) 5 MG tablet Take 1 tablet (5 mg total) by mouth daily. 30 tablet 3   aspirin 81 MG EC tablet TAKE 1 TABLET (81 MG TOTAL) BY MOUTH DAILY. SWALLOW WHOLE. 90 tablet 3   CAPSAICIN EX Apply 1 application topically in the morning and at bedtime.     carbidopa-levodopa (SINEMET IR) 25-100 MG tablet TAKE 1 TABLET BY MOUTH THREE TIMES A DAY 270 tablet 1   colchicine 0.6 MG tablet Take 0.6 mg by mouth daily as needed (for gout).     cyclobenzaprine (FLEXERIL) 5 MG tablet Take 1 tablet (5 mg total) by mouth 2 (two) times daily as  needed for muscle spasms. 10 tablet 0   divalproex (DEPAKOTE) 250 MG DR tablet Take 250 mg by mouth at bedtime.     DULoxetine (CYMBALTA) 30 MG capsule Take 1 capsule by mouth daily.     empagliflozin (JARDIANCE) 10 MG TABS tablet Take 10 mg by mouth daily.      esomeprazole (NEXIUM) 40 MG capsule TAKE ONE CAPSULE BY MOUTH TWICE A DAY FOR ACID REFLUX.(TAKE ON AN EMPTY STOMACH 30 MINUTES PRIOR TO A MEAL)(APPROVED)     fluticasone (FLONASE) 50 MCG/ACT nasal spray Place 1 spray into both nostrils daily. 16 g 0   furosemide (LASIX) 40 MG tablet Take 40 mg by mouth daily.     gabapentin (NEURONTIN) 100 MG capsule Take 100 mg by mouth daily.     gabapentin (NEURONTIN) 600 MG tablet Take 600 mg by mouth 3 (three) times daily.     ibuprofen (ADVIL) 200 MG tablet Take 200-400 mg by mouth as needed. Three times a week     insulin glargine-yfgn (SEMGLEE) 100 UNIT/ML Pen INJECT 48 UNITS SUBCUTANEOUSLY DAILY DIABETES FOR DIABETES NEW DOSE SINCE OFF METFORMIN (CONVERTED FROM LANTUS)     isosorbide mononitrate (IMDUR) 60 MG 24 hr tablet TAKE 1 TABLET BY MOUTH EVERY DAY 90 tablet 3   ketoconazole (NIZORAL) 2 % cream Apply 1 application topically daily as needed for irritation (yeast irritation).      lisinopril (  ZESTRIL) 10 MG tablet Take 1 tablet (10 mg total) by mouth daily. 90 tablet 3   metoprolol succinate (TOPROL-XL) 50 MG 24 hr tablet TAKE 1 TABLET BY MOUTH DAILY. TAKE WITH OR IMMEDIATELY FOLLOWING A MEAL. 90 tablet 3   metroNIDAZOLE (METROGEL) 0.75 % gel Apply 1 application topically daily as needed (Skin rash on face).      Mirabegron (MYRBETRIQ PO) Take 1 tablet by mouth daily.     OVER THE COUNTER MEDICATION Apply 1 application topically daily as needed (Leg cramps). Thera Wox     oxybutynin (DITROPAN) 5 MG tablet Take 5 mg by mouth at bedtime.     pantoprazole (PROTONIX) 40 MG tablet Take protonix 40 mg twice daily for 2 months.  Resume once per day after that. 60 tablet 0   Polyethyl Glycol-Propyl  Glycol (SYSTANE) 0.4-0.3 % SOLN Place 1 drop into both eyes daily as needed (Dry eye).     polyethylene glycol (MIRALAX / GLYCOLAX) 17 g packet Take 17 g by mouth daily as needed for mild constipation or moderate constipation.     pyridOXINE (VITAMIN B-6) 100 MG tablet Take 100 mg by mouth daily.     rosuvastatin (CRESTOR) 20 MG tablet TAKE 1 TABLET BY MOUTH EVERY DAY 90 tablet 3   SYNTHROID 25 MCG tablet Take 25 mcg by mouth daily.      tamsulosin (FLOMAX) 0.4 MG CAPS capsule Take 0.4 mg by mouth every evening.      TOUJEO SOLOSTAR 300 UNIT/ML SOPN Inject 50 Units into the skin at bedtime.  3   triamcinolone cream (KENALOG) 0.1 % Apply 1 application topically at bedtime.     nitroGLYCERIN (NITROSTAT) 0.4 MG SL tablet Place 1 tablet (0.4 mg total) under the tongue every 5 (five) minutes as needed for chest pain. 30 tablet 3   No current facility-administered medications on file prior to visit.    Cardiovascular studies: EKG 07/10/2021: Normal sinus rhythm rate of 65 bpm.  Normal axis.  No evidence of ischemia or underlying injury pattern.  Compared EKG 12/06/2020, no significant change.  Coronary angiography 05/18/2019: Moderate diffuse ectasia involving specifically the LAD and also the right coronary artery with diffuse moderate amount of calcification, 50% stepdown post ectatic segment, slow flow which improved with intracoronary nitroglycerin to brisk flow. 50% mid ramus intermediate disease again mild ectasia noted. Recommendation: Patient already on isosorbide mononitrate, I will add amlodipine 5 mg daily.  Weight loss, exercise to improve endothelial function and control of diabetes recommended.  50 mill contrast utilized.  Lexiscan Myoview stress test 08/05/2018: Lexiscan stress test was performed. Stress EKG is non-diagnostic, as this is pharmacological stress test. SPECT imaging reveals small sized, mild intensity, reversible perfusion defect in mid to basal inferior/inferolateral  myocardium, suggesting possible LCx/PDA territory ischemia. LVEF 62%. Low risk study.   Echocardiogram 07/23/2018:  1. The left ventricle has normal systolic function, with an ejection fraction of 55-60%. The cavity size was normal. Left ventricular diastolic Doppler parameters are consistent with impaired relaxation.  2. The right ventricle has normal systolic function. The cavity was normal. There is no increase in right ventricular wall thickness.  3. The aortic valve is tricuspid. Mild calcification of the aortic valve. Trace aortic stenosis. of the aortic valve.  4. No other significant valvular abnormalities seen.  CT Chest 07/14/2018: 1. No evidence of pulmonary embolism, although evaluation of the segmental pulmonary arteries is limited due to motion artifact. 2. Diffuse mosaic attenuation of the lungs, nonspecific, but possibly  reflecting small airways disease. 3.  Aortic atherosclerosis (ICD10-I70.0).  Recent labs:    Latest Ref Rng & Units 12/18/2020    7:53 AM 11/07/2020    3:27 PM 08/17/2019    4:43 PM  CMP  Glucose 70 - 99 mg/dL 107  87  108   BUN 8 - 27 mg/dL _0 Creatinine 0.76 - 1.27 mg/dL 1.10  1.01  1.07   Sodium 134 - 144 mmol/L 139  139  136   Potassium 3.5 - 5.2 mmol/L 4.6  5.9  4.3   Chloride 96 - 106 mmol/L 100  99  102   CO2 20 - 29 mmol/L _1 Calcium 8.6 - 10.2 mg/dL 9.7  10.2  9.5   Total Protein 6.0 - 8.5 g/dL  7.6    Total Bilirubin 0.0 - 1.2 mg/dL  0.3    Alkaline Phos 44 - 121 IU/L  111    AST 0 - 40 IU/L  38    ALT 0 - 44 IU/L  35        Latest Ref Rng & Units 08/17/2019    4:43 PM 05/14/2019    8:32 AM 03/17/2010   11:45 AM  CBC  WBC 4.0 - 10.5 K/uL 11.5  8.9    Hemoglobin 13.0 - 17.0 g/dL 14.7  14.9  15.6   Hematocrit 39.0 - 52.0 % 46.0  45.5  46.0   Platelets 150 - 400 K/uL 290  388     Lipid Panel     Component Value Date/Time   CHOL 145 12/18/2020 0753   TRIG 197 (H) 12/18/2020 0753   HDL 33 (L) 12/18/2020 0753    CHOLHDL 4.0 04/26/2019 0837   LDLCALC 79 12/18/2020 0753   HEMOGLOBIN A1C No results found for: "HGBA1C", "MPG" TSH No results for input(s): "TSH" in the last 8760 hours.  Other Labs: 11/07/2020: Glucose 87, BUN/Cr 15/1.01. EGFR 78. Na/K 139/5.9. Rest of the CMP normal  08/17/2019: Glucose 108, BUN/Cr 22/1.07. EGFR >60. Na/K 136/4.3.  H/H 14/46. MCV 89. Platelets 290  05/18/2019:  BUN/Cr 17/1.17.  Na/K 138/4.1. Glucose 81. EGFR 60. Rest of the CMP normal H/H 14.9/. MCV 86. Platelets 388.  04/26/2019: Chol 147, TG 149, HDL 37, LDL 84  03/23/2019: A1C 6.700 % TSH 1.77  Review of Systems  Cardiovascular:  Positive for chest pain (with exertion) and dyspnea on exertion. Negative for leg swelling, palpitations and syncope.         Vitals:   07/18/21 0815 07/18/21 0824  BP: (!) 150/89 128/73  Pulse: 70 70  Resp: 16   Temp: 98 F (36.7 C)   SpO2: 94%    Orthostatic VS for the past 72 hrs (Last 3 readings):  Patient Position BP Location Cuff Size  07/18/21 0824 Sitting Left Arm Large  07/18/21 0815 Sitting Left Arm Normal    Body mass index is 34.87 kg/m. Filed Weights   07/18/21 0815  Weight: 243 lb (110.2 kg)     Objective:   Physical Exam Vitals reviewed.  Constitutional:      Appearance: He is well-developed. He is obese.  Neck:     Vascular: No JVD.  Cardiovascular:     Rate and Rhythm: Normal rate and regular rhythm.     Pulses: Intact distal pulses.     Heart sounds: Murmur heard.     Systolic murmur is present with a grade of 2/6 at the upper  right sternal border.  Pulmonary:     Effort: Pulmonary effort is normal.     Breath sounds: Normal breath sounds. No wheezing or rales.          Assessment & Recommendations:   76 y.o. Caucasian male with hypertension, hyperlipidemia, mod nonobstructive CAD, type 2 DM, OSA on CPAP, hypothyroidism, GERD  CAD: Moderate nonobstructive disease with possible endothelial dysfunction. Continue aspirin,  statin, metoprolol, amlodipine, Imdur. Lipids are well controlled Given patient's dyspnea and chest pain with exertion recommend further evaluation with nuclear stress test and echocardiogram Will also obtain BNP given dyspnea   Aortic murmur:  New aortic murmur noted on exam.  Will obtain echocardiogram to evaluate further.  Hypertension: Patient's blood pressure is well controlled and he has had no recurrence of lightheadedness or dizziness concerning for orthostasis. Continue present medications.  Type 2 diabetes mellitus without complication: We will defer management to PCP.  Mixed hyperlipidemia: Continue statin therapy Lipids well controlled at last check  Follow-up in 6 months, sooner if needed.   Alethia Berthold, PA-C 07/18/2021, 10:18 AM Office: [phone]

## 2021-07-18 ENCOUNTER — Encounter: Payer: Self-pay | Admitting: Student

## 2021-07-18 ENCOUNTER — Ambulatory Visit: Payer: HMO | Admitting: Student

## 2021-07-18 VITALS — BP 128/73 | HR 70 | Temp 98.0°F | Resp 16 | Ht 70.0 in | Wt 243.0 lb

## 2021-07-18 DIAGNOSIS — I25118 Atherosclerotic heart disease of native coronary artery with other forms of angina pectoris: Secondary | ICD-10-CM | POA: Diagnosis not present

## 2021-07-18 DIAGNOSIS — I358 Other nonrheumatic aortic valve disorders: Secondary | ICD-10-CM

## 2021-07-18 DIAGNOSIS — E875 Hyperkalemia: Secondary | ICD-10-CM

## 2021-07-25 DIAGNOSIS — I25118 Atherosclerotic heart disease of native coronary artery with other forms of angina pectoris: Secondary | ICD-10-CM | POA: Diagnosis not present

## 2021-07-26 LAB — BRAIN NATRIURETIC PEPTIDE: BNP: 34.7 pg/mL (ref 0.0–100.0)

## 2021-08-01 ENCOUNTER — Ambulatory Visit: Payer: HMO

## 2021-08-01 DIAGNOSIS — I25118 Atherosclerotic heart disease of native coronary artery with other forms of angina pectoris: Secondary | ICD-10-CM

## 2021-08-06 NOTE — Progress Notes (Signed)
Tried calling patient no answer left a vm

## 2021-08-06 NOTE — Progress Notes (Signed)
Patient called back, he is already scheduled for 7/21 to se you after the echocardiogram.

## 2021-08-10 ENCOUNTER — Ambulatory Visit: Payer: HMO

## 2021-08-10 ENCOUNTER — Encounter: Payer: Self-pay | Admitting: Student

## 2021-08-10 ENCOUNTER — Ambulatory Visit: Payer: HMO | Admitting: Student

## 2021-08-10 VITALS — BP 108/62 | HR 67 | Temp 98.0°F | Resp 17 | Ht 70.0 in | Wt 241.6 lb

## 2021-08-10 DIAGNOSIS — I358 Other nonrheumatic aortic valve disorders: Secondary | ICD-10-CM

## 2021-08-10 DIAGNOSIS — I209 Angina pectoris, unspecified: Secondary | ICD-10-CM

## 2021-08-10 DIAGNOSIS — I25118 Atherosclerotic heart disease of native coronary artery with other forms of angina pectoris: Secondary | ICD-10-CM

## 2021-08-10 NOTE — Progress Notes (Signed)
Patient referred by Haywood Pao, MD for chest pain  Subjective:   Anthony [name], male    DOB: 08-Mar-1945, 76 y.o.   MRN: [id number]   Chief Complaint  Patient presents with   Coronary Artery Disease   Follow-up   Results   76 y.o. Caucasian male with hypertension, hyperlipidemia, mod nonobstructive CAD, type 2 DM, OSA on CPAP, hypothyroidism, GERD  Patient was last seen in the office 07/18/2021 at which time given complaints of chest pain and dyspnea ordered stress test and echocardiogram as well as BNP.  BMP was normal limits.  Results of echocardiogram are pending, however Lexiscan nuclear stress test revealed ischemia in the LAD and LCx distribution. He now presents for follow up. Patient continues to have both exertional chest pain and dyspnea quickly relieved with rest.  Patient's blood pressure remains soft, he reports compliance with current medication regimen.   Current Outpatient Medications on File Prior to Visit  Medication Sig Dispense Refill   acetaminophen (TYLENOL) 500 MG tablet Take 500 mg by mouth every 6 (six) hours as needed.     allopurinol (ZYLOPRIM) 300 MG tablet Take 300 mg by mouth daily.      Alogliptin Benzoate 25 MG TABS Take 25 mg by mouth daily.     amLODipine (NORVASC) 5 MG tablet Take 1 tablet (5 mg total) by mouth daily. 30 tablet 3   aspirin 81 MG EC tablet TAKE 1 TABLET (81 MG TOTAL) BY MOUTH DAILY. SWALLOW WHOLE. 90 tablet 3   CAPSAICIN EX Apply 1 application topically in the morning and at bedtime.     carbidopa-levodopa (SINEMET IR) 25-100 MG tablet TAKE 1 TABLET BY MOUTH THREE TIMES A DAY 270 tablet 1   colchicine 0.6 MG tablet Take 0.6 mg by mouth daily as needed (for gout).     cyclobenzaprine (FLEXERIL) 5 MG tablet Take 1 tablet (5 mg total) by mouth 2 (two) times daily as needed for muscle spasms. 10 tablet 0   divalproex (DEPAKOTE) 250 MG DR tablet Take 250 mg by mouth at bedtime.     DULoxetine (CYMBALTA) 30 MG capsule Take 1  capsule by mouth daily.     empagliflozin (JARDIANCE) 10 MG TABS tablet Take 10 mg by mouth daily.      esomeprazole (NEXIUM) 40 MG capsule TAKE ONE CAPSULE BY MOUTH TWICE A DAY FOR ACID REFLUX.(TAKE ON AN EMPTY STOMACH 30 MINUTES PRIOR TO A MEAL)(APPROVED)     furosemide (LASIX) 40 MG tablet Take 40 mg by mouth daily.     gabapentin (NEURONTIN) 100 MG capsule Take 100 mg by mouth daily.     gabapentin (NEURONTIN) 600 MG tablet Take 600 mg by mouth 3 (three) times daily.     ibuprofen (ADVIL) 200 MG tablet Take 200-400 mg by mouth as needed. Three times a week     isosorbide mononitrate (IMDUR) 60 MG 24 hr tablet TAKE 1 TABLET BY MOUTH EVERY DAY 90 tablet 3   ketoconazole (NIZORAL) 2 % cream Apply 1 application topically daily as needed for irritation (yeast irritation).      lisinopril (ZESTRIL) 10 MG tablet Take 1 tablet (10 mg total) by mouth daily. 90 tablet 3   metoprolol succinate (TOPROL-XL) 50 MG 24 hr tablet TAKE 1 TABLET BY MOUTH DAILY. TAKE WITH OR IMMEDIATELY FOLLOWING A MEAL. 90 tablet 3   metroNIDAZOLE (METROGEL) 0.75 % gel Apply 1 application topically daily as needed (Skin rash on face).      Mirabegron (  MYRBETRIQ PO) Take 1 tablet by mouth daily.     nitroGLYCERIN (NITROSTAT) 0.4 MG SL tablet Place 1 tablet (0.4 mg total) under the tongue every 5 (five) minutes as needed for chest pain. 30 tablet 3   OVER THE COUNTER MEDICATION Apply 1 application topically daily as needed (Leg cramps). Thera Wox     oxybutynin (DITROPAN) 5 MG tablet Take 5 mg by mouth at bedtime.     pantoprazole (PROTONIX) 40 MG tablet Take protonix 40 mg twice daily for 2 months.  Resume once per day after that. 60 tablet 0   Polyethyl Glycol-Propyl Glycol (SYSTANE) 0.4-0.3 % SOLN Place 1 drop into both eyes daily as needed (Dry eye).     polyethylene glycol (MIRALAX / GLYCOLAX) 17 g packet Take 17 g by mouth daily as needed for mild constipation or moderate constipation.     pyridOXINE (VITAMIN B-6) 100 MG  tablet Take 100 mg by mouth daily.     rosuvastatin (CRESTOR) 20 MG tablet TAKE 1 TABLET BY MOUTH EVERY DAY 90 tablet 3   SYNTHROID 25 MCG tablet Take 25 mcg by mouth daily.      tamsulosin (FLOMAX) 0.4 MG CAPS capsule Take 0.4 mg by mouth every evening.      TOUJEO SOLOSTAR 300 UNIT/ML SOPN Inject 50 Units into the skin at bedtime.  3   triamcinolone cream (KENALOG) 0.1 % Apply 1 application topically at bedtime.     fluticasone (FLONASE) 50 MCG/ACT nasal spray Place 1 spray into both nostrils daily. 16 g 0   insulin glargine-yfgn (SEMGLEE) 100 UNIT/ML Pen Inject 50 Units into the skin daily.     No current facility-administered medications on file prior to visit.    Cardiovascular studies: Echocardiogram 08/10/2021: Pending  Lexiscan Tetrofosmin stress test 08/01/2021: 1 Day Rest/Stress protocol.  Scheduled as an exercise nuclear stress test.  Exercise time 6 minutes 3 seconds, achieved 7.05 METS, developed 7 out of 10 chest pain, 70% age-predicted maximum heart rate, hypertensive response to exercise (Rest BP 112/80, peak 200/60). Patient was converted to pharmacological stress test. Stress EKG is non-diagnostic, target heart rate not achieved. Medium size, mild intensity reversible perfusion defect suggestive of ischemia in the distal LAD distribution. Medium size, mild intensity reversible perfusion defect suggestive of ischemia in the LCx distribution. Left ventricular size normal, wall thickness preserved, calculated LVEF 60%. Prior study dated 08/05/2018 reported small size, mild intensity reversible perfusion defect suggestive of ischemia in the LCx/PDA distribution, calculated LVEF 62%. Intermediate risk & clinical correlation required.  EKG 07/10/2021: Normal sinus rhythm rate of 65 bpm.  Normal axis.  No evidence of ischemia or underlying injury pattern.  Compared EKG 12/06/2020, no significant change.  Coronary angiography 05/18/2019: Moderate diffuse ectasia involving  specifically the LAD and also the right coronary artery with diffuse moderate amount of calcification, 50% stepdown post ectatic segment, slow flow which improved with intracoronary nitroglycerin to brisk flow. 50% mid ramus intermediate disease again mild ectasia noted. Recommendation: Patient already on isosorbide mononitrate, I will add amlodipine 5 mg daily.  Weight loss, exercise to improve endothelial function and control of diabetes recommended.  50 mill contrast utilized.  Lexiscan Myoview stress test 08/05/2018: Lexiscan stress test was performed. Stress EKG is non-diagnostic, as this is pharmacological stress test. SPECT imaging reveals small sized, mild intensity, reversible perfusion defect in mid to basal inferior/inferolateral myocardium, suggesting possible LCx/PDA territory ischemia. LVEF 62%. Low risk study.   Echocardiogram 07/23/2018:  1. The left ventricle has normal systolic  function, with an ejection fraction of 55-60%. The cavity size was normal. Left ventricular diastolic Doppler parameters are consistent with impaired relaxation.  2. The right ventricle has normal systolic function. The cavity was normal. There is no increase in right ventricular wall thickness.  3. The aortic valve is tricuspid. Mild calcification of the aortic valve. Trace aortic stenosis. of the aortic valve.  4. No other significant valvular abnormalities seen.  CT Chest 07/14/2018: 1. No evidence of pulmonary embolism, although evaluation of the segmental pulmonary arteries is limited due to motion artifact. 2. Diffuse mosaic attenuation of the lungs, nonspecific, but possibly reflecting small airways disease. 3.  Aortic atherosclerosis (ICD10-I70.0).  Recent labs:    Latest Ref Rng & Units 12/18/2020    7:53 AM 11/07/2020    3:27 PM 08/17/2019    4:43 PM  CMP  Glucose 70 - 99 mg/dL 107  87  108   BUN 8 - 27 mg/dL _0 Creatinine 0.76 - 1.27 mg/dL 1.10  1.01  1.07   Sodium 134 - 144  mmol/L 139  139  136   Potassium 3.5 - 5.2 mmol/L 4.6  5.9  4.3   Chloride 96 - 106 mmol/L 100  99  102   CO2 20 - 29 mmol/L _1 Calcium 8.6 - 10.2 mg/dL 9.7  10.2  9.5   Total Protein 6.0 - 8.5 g/dL  7.6    Total Bilirubin 0.0 - 1.2 mg/dL  0.3    Alkaline Phos 44 - 121 IU/L  111    AST 0 - 40 IU/L  38    ALT 0 - 44 IU/L  35        Latest Ref Rng & Units 08/17/2019    4:43 PM 05/14/2019    8:32 AM 03/17/2010   11:45 AM  CBC  WBC 4.0 - 10.5 K/uL 11.5  8.9    Hemoglobin 13.0 - 17.0 g/dL 14.7  14.9  15.6   Hematocrit 39.0 - 52.0 % 46.0  45.5  46.0   Platelets 150 - 400 K/uL 290  388     Lipid Panel     Component Value Date/Time   CHOL 145 12/18/2020 0753   TRIG 197 (H) 12/18/2020 0753   HDL 33 (L) 12/18/2020 0753   CHOLHDL 4.0 04/26/2019 0837   LDLCALC 79 12/18/2020 0753   HEMOGLOBIN A1C No results found for: "HGBA1C", "MPG" TSH No results for input(s): "TSH" in the last 8760 hours.  BNP    Component Value Date/Time   BNP 34.7 07/25/2021 0806    ProBNP No results found for: "PROBNP"  Other Labs: 11/07/2020: Glucose 87, BUN/Cr 15/1.01. EGFR 78. Na/K 139/5.9. Rest of the CMP normal  08/17/2019: Glucose 108, BUN/Cr 22/1.07. EGFR >60. Na/K 136/4.3.  H/H 14/46. MCV 89. Platelets 290  05/18/2019:  BUN/Cr 17/1.17.  Na/K 138/4.1. Glucose 81. EGFR 60. Rest of the CMP normal H/H 14.9/. MCV 86. Platelets 388.  04/26/2019: Chol 147, TG 149, HDL 37, LDL 84  03/23/2019: A1C 6.700 % TSH 1.77  Review of Systems  Cardiovascular:  Positive for chest pain (with exertion) and dyspnea on exertion. Negative for leg swelling, palpitations and syncope.         Vitals:   08/10/21 1048  BP: 108/62  Pulse: 67  Resp: 17  Temp: 98 F (36.7 C)  SpO2: 98%   Orthostatic VS for the past 72 hrs (Last 3 readings):  Patient  Position BP Location Cuff Size  08/10/21 1048 Sitting Left Arm Normal    Body mass index is 34.67 kg/m. Filed Weights   08/10/21 1048  Weight:  241 lb 9.6 oz (109.6 kg)     Objective:   Physical Exam Vitals reviewed.  Constitutional:      Appearance: He is well-developed. He is obese.  Neck:     Vascular: No JVD.  Cardiovascular:     Rate and Rhythm: Normal rate and regular rhythm.     Pulses: Intact distal pulses.     Heart sounds: Murmur heard.     Systolic murmur is present with a grade of 2/6 at the upper right sternal border.  Pulmonary:     Effort: Pulmonary effort is normal.     Breath sounds: Normal breath sounds. No wheezing or rales.   Physical exam unchanged compared to previous office visit.        Assessment & Recommendations:   76 y.o. Caucasian male with hypertension, hyperlipidemia, mod nonobstructive CAD, type 2 DM, OSA on CPAP, hypothyroidism, GERD  CAD: Reviewed and discussed results of stress test revealing clear reversible ischemia in the LAD and LCx distribution, details above.  Patient continues to have symptoms of exertional chest pain and dyspnea.  Given stress test findings and continued anginal symptoms recommend repeat coronary angiography.  The left heart catheterization procedure was explained to the patient in detail. The indication, alternatives, risks and benefits were reviewed. Complications including but not limited to bleeding, infection, acute kidney injury, blood transfusion, heart rhythm disturbances, contrast (dye) reaction, damage to the arteries or nerves in the legs or hands, cerebrovascular accident, myocardial infarction, need for emergent bypass surgery, blood clots in the legs, possible need for emergent blood transfusion, and rarely death were reviewed and discussed with the patient. The patient voices understanding and wishes to proceed.  Continue aspirin, statin, metoprolol, amlodipine, Imdur. Lipids are well controlled Echo results pending   Aortic murmur:  Echocardiogram results pending   Hypertension: Patient's blood pressure remains soft. Continue present  medications.   Type 2 diabetes mellitus without complication: We will defer management to PCP.  Mixed hyperlipidemia: Continue statin therapy  Follow-up in after cardiac catheterization.   Patient was seen in collaboration with Dr. Virgina Jock and he is in agreement with the plan.    Alethia Berthold, PA-C 08/10/2021, 11:15 AM Office: [phone]

## 2021-08-15 DIAGNOSIS — E1165 Type 2 diabetes mellitus with hyperglycemia: Secondary | ICD-10-CM | POA: Diagnosis not present

## 2021-08-16 ENCOUNTER — Ambulatory Visit: Payer: HMO | Admitting: Adult Health

## 2021-08-17 DIAGNOSIS — E7800 Pure hypercholesterolemia, unspecified: Secondary | ICD-10-CM | POA: Diagnosis not present

## 2021-08-17 DIAGNOSIS — E1140 Type 2 diabetes mellitus with diabetic neuropathy, unspecified: Secondary | ICD-10-CM | POA: Diagnosis not present

## 2021-08-17 DIAGNOSIS — I119 Hypertensive heart disease without heart failure: Secondary | ICD-10-CM | POA: Diagnosis not present

## 2021-08-17 DIAGNOSIS — Z794 Long term (current) use of insulin: Secondary | ICD-10-CM | POA: Diagnosis not present

## 2021-08-20 ENCOUNTER — Other Ambulatory Visit: Payer: Self-pay | Admitting: Student

## 2021-08-20 DIAGNOSIS — I25118 Atherosclerotic heart disease of native coronary artery with other forms of angina pectoris: Secondary | ICD-10-CM | POA: Diagnosis not present

## 2021-08-21 LAB — BASIC METABOLIC PANEL
BUN/Creatinine Ratio: 14 (ref 10–24)
BUN: 16 mg/dL (ref 8–27)
CO2: 24 mmol/L (ref 20–29)
Calcium: 9.4 mg/dL (ref 8.6–10.2)
Chloride: 100 mmol/L (ref 96–106)
Creatinine, Ser: 1.11 mg/dL (ref 0.76–1.27)
Glucose: 134 mg/dL — ABNORMAL HIGH (ref 70–99)
Potassium: 4.8 mmol/L (ref 3.5–5.2)
Sodium: 139 mmol/L (ref 134–144)
eGFR: 69 mL/min/{1.73_m2} (ref >59)

## 2021-08-21 LAB — CBC
Hematocrit: 46.7 % (ref 37.5–51.0)
Hemoglobin: 15 g/dL (ref 13.0–17.7)
MCH: 28.3 pg (ref 26.6–33.0)
MCHC: 32.1 g/dL (ref 31.5–35.7)
MCV: 88 fL (ref 79–97)
Platelets: 254 10*3/uL (ref 150–450)
RBC: 5.3 x10E6/uL (ref 4.14–5.80)
RDW: 14.8 % (ref 11.6–15.4)
WBC: 9.2 10*3/uL (ref 3.4–10.8)

## 2021-08-28 ENCOUNTER — Other Ambulatory Visit: Payer: Self-pay

## 2021-08-28 ENCOUNTER — Ambulatory Visit (HOSPITAL_COMMUNITY)
Admission: RE | Disposition: A | Discharge status: Home / Self Care | Payer: Self-pay | Source: Home / Self Care | Attending: Cardiology

## 2021-08-28 ENCOUNTER — Ambulatory Visit (HOSPITAL_COMMUNITY)
Admission: RE | Admit: 2021-08-28 | Discharge: 2021-08-28 | Disposition: A | Discharge status: Home / Self Care | Payer: HMO | Attending: Cardiology | Admitting: Cardiology

## 2021-08-28 ENCOUNTER — Other Ambulatory Visit (HOSPITAL_COMMUNITY): Payer: Self-pay

## 2021-08-28 DIAGNOSIS — K219 Gastro-esophageal reflux disease without esophagitis: Secondary | ICD-10-CM | POA: Insufficient documentation

## 2021-08-28 DIAGNOSIS — G4733 Obstructive sleep apnea (adult) (pediatric): Secondary | ICD-10-CM | POA: Diagnosis not present

## 2021-08-28 DIAGNOSIS — E785 Hyperlipidemia, unspecified: Secondary | ICD-10-CM | POA: Diagnosis not present

## 2021-08-28 DIAGNOSIS — I2584 Coronary atherosclerosis due to calcified coronary lesion: Secondary | ICD-10-CM | POA: Insufficient documentation

## 2021-08-28 DIAGNOSIS — Z9861 Coronary angioplasty status: Secondary | ICD-10-CM

## 2021-08-28 DIAGNOSIS — I25118 Atherosclerotic heart disease of native coronary artery with other forms of angina pectoris: Secondary | ICD-10-CM | POA: Diagnosis not present

## 2021-08-28 DIAGNOSIS — E119 Type 2 diabetes mellitus without complications: Secondary | ICD-10-CM | POA: Diagnosis not present

## 2021-08-28 DIAGNOSIS — I10 Essential (primary) hypertension: Secondary | ICD-10-CM | POA: Insufficient documentation

## 2021-08-28 DIAGNOSIS — E039 Hypothyroidism, unspecified: Secondary | ICD-10-CM | POA: Diagnosis not present

## 2021-08-28 DIAGNOSIS — R9439 Abnormal result of other cardiovascular function study: Secondary | ICD-10-CM | POA: Diagnosis present

## 2021-08-28 HISTORY — PX: LEFT HEART CATH AND CORONARY ANGIOGRAPHY: CATH118249

## 2021-08-28 HISTORY — PX: CORONARY BALLOON ANGIOPLASTY: CATH118233

## 2021-08-28 LAB — POCT ACTIVATED CLOTTING TIME: Activated Clotting Time: 588 seconds

## 2021-08-28 LAB — GLUCOSE, CAPILLARY: Glucose-Capillary: 97 mg/dL (ref 70–99)

## 2021-08-28 SURGERY — LEFT HEART CATH AND CORONARY ANGIOGRAPHY
Anesthesia: LOCAL

## 2021-08-28 MED ORDER — FENTANYL CITRATE (PF) 100 MCG/2ML IJ SOLN
INTRAMUSCULAR | Status: AC
  Filled 2021-08-28: qty 2

## 2021-08-28 MED ORDER — LABETALOL HCL 5 MG/ML IV SOLN: 10.0000 mg | INTRAVENOUS | Status: DC | PRN

## 2021-08-28 MED ORDER — SODIUM CHLORIDE 0.9 % IV SOLN: 250.0000 mL | INTRAVENOUS | Status: DC | PRN

## 2021-08-28 MED ORDER — LIDOCAINE HCL (PF) 1 % IJ SOLN
INTRAMUSCULAR | Status: AC
  Filled 2021-08-28: qty 30

## 2021-08-28 MED ORDER — ONDANSETRON HCL 4 MG/2ML IJ SOLN: 4.0000 mg | Freq: Four times a day (QID) | INTRAMUSCULAR | Status: DC | PRN

## 2021-08-28 MED ORDER — SODIUM CHLORIDE 0.9 % WEIGHT BASED INFUSION
1.0000 mL/kg/h | INTRAVENOUS | Status: DC
Start: 2021-08-28 — End: 2021-08-28

## 2021-08-28 MED ORDER — NITROGLYCERIN 1 MG/10 ML FOR IR/CATH LAB
INTRA_ARTERIAL | Status: AC
  Filled 2021-08-28: qty 10

## 2021-08-28 MED ORDER — SODIUM CHLORIDE 0.9% FLUSH: 3.0000 mL | INTRAVENOUS | Status: DC | PRN

## 2021-08-28 MED ORDER — HYDRALAZINE HCL 20 MG/ML IJ SOLN: 10.0000 mg | INTRAMUSCULAR | Status: DC | PRN

## 2021-08-28 MED ORDER — SODIUM CHLORIDE 0.9% FLUSH
3.0000 mL | Freq: Two times a day (BID) | INTRAVENOUS | Status: DC
Start: 2021-08-28 — End: 2021-08-28

## 2021-08-28 MED ORDER — SODIUM CHLORIDE 0.9% FLUSH: 3.0000 mL | Freq: Two times a day (BID) | INTRAVENOUS | Status: DC

## 2021-08-28 MED ORDER — FENTANYL CITRATE (PF) 100 MCG/2ML IJ SOLN
INTRAMUSCULAR | Status: DC | PRN
  Administered 2021-08-28: 50 ug via INTRAVENOUS

## 2021-08-28 MED ORDER — SODIUM CHLORIDE 0.9 % IV SOLN
INTRAVENOUS | Status: AC
Start: 2021-08-28 — End: 2021-08-28

## 2021-08-28 MED ORDER — VERAPAMIL HCL 2.5 MG/ML IV SOLN
INTRAVENOUS | Status: DC | PRN
  Administered 2021-08-28: 10 mL via INTRA_ARTERIAL

## 2021-08-28 MED ORDER — ACETAMINOPHEN 325 MG PO TABS: 650.0000 mg | ORAL_TABLET | ORAL | Status: DC | PRN

## 2021-08-28 MED ORDER — HEPARIN (PORCINE) IN NACL 1000-0.9 UT/500ML-% IV SOLN
INTRAVENOUS | Status: AC
  Filled 2021-08-28: qty 1000

## 2021-08-28 MED ORDER — VERAPAMIL HCL 2.5 MG/ML IV SOLN
INTRAVENOUS | Status: AC
  Filled 2021-08-28: qty 2

## 2021-08-28 MED ORDER — ASPIRIN 81 MG PO CHEW: 81.0000 mg | CHEWABLE_TABLET | ORAL | Status: DC

## 2021-08-28 MED ORDER — MIDAZOLAM HCL 2 MG/2ML IJ SOLN
INTRAMUSCULAR | Status: DC | PRN
  Administered 2021-08-28: 1 mg via INTRAVENOUS

## 2021-08-28 MED ORDER — TICAGRELOR 90 MG PO TABS
ORAL_TABLET | ORAL | Status: DC | PRN
  Administered 2021-08-28: 180 mg via ORAL

## 2021-08-28 MED ORDER — HEPARIN SODIUM (PORCINE) 1000 UNIT/ML IJ SOLN
INTRAMUSCULAR | Status: AC
  Filled 2021-08-28: qty 10

## 2021-08-28 MED ORDER — HEPARIN (PORCINE) IN NACL 1000-0.9 UT/500ML-% IV SOLN
INTRAVENOUS | Status: DC | PRN
  Administered 2021-08-28 (×2): 500 mL

## 2021-08-28 MED ORDER — MIDAZOLAM HCL 2 MG/2ML IJ SOLN
INTRAMUSCULAR | Status: AC
  Filled 2021-08-28: qty 2

## 2021-08-28 MED ORDER — CLOPIDOGREL BISULFATE 75 MG PO TABS: 75.0000 mg | ORAL_TABLET | Freq: Every day | ORAL | Status: DC

## 2021-08-28 MED ORDER — SODIUM CHLORIDE 0.9 % WEIGHT BASED INFUSION
3.0000 mL/kg/h | INTRAVENOUS | Status: AC
  Administered 2021-08-28: 3 mL/kg/h via INTRAVENOUS

## 2021-08-28 MED ORDER — CLOPIDOGREL BISULFATE 75 MG PO TABS
75.0000 mg | ORAL_TABLET | Freq: Every day | ORAL | 1 refills | Status: DC
  Filled 2021-08-28 – 2021-09-26 (×2): qty 30, 30d supply, fill #0

## 2021-08-28 MED ORDER — HEPARIN SODIUM (PORCINE) 1000 UNIT/ML IJ SOLN
INTRAMUSCULAR | Status: DC | PRN
  Administered 2021-08-28: 8000 [IU] via INTRAVENOUS
  Administered 2021-08-28: 2000 [IU] via INTRAVENOUS

## 2021-08-28 MED ORDER — IOHEXOL 350 MG/ML SOLN
INTRAVENOUS | Status: DC | PRN
  Administered 2021-08-28: 80 mL

## 2021-08-28 MED ORDER — TICAGRELOR 90 MG PO TABS
ORAL_TABLET | ORAL | Status: AC
  Filled 2021-08-28: qty 2

## 2021-08-28 MED ORDER — LIDOCAINE HCL (PF) 1 % IJ SOLN
INTRAMUSCULAR | Status: DC | PRN
  Administered 2021-08-28: 3 mL via INTRADERMAL

## 2021-08-28 SURGICAL SUPPLY — 16 items
BALLN SAPPHIRE 2.0X12 (BALLOONS) ×2
BALLOON SAPPHIRE 2.0X12 (BALLOONS) IMPLANT
CATH LAUNCHER 6FR EBU 3 (CATHETERS) ×1 IMPLANT
CATH OPTITORQUE TIG 4.0 5F (CATHETERS) ×1 IMPLANT
DEVICE RAD COMP TR BAND LRG (VASCULAR PRODUCTS) ×1 IMPLANT
GLIDESHEATH SLEND A-KIT 6F 22G (SHEATH) ×1 IMPLANT
GUIDEWIRE INQWIRE 1.5J.035X260 (WIRE) IMPLANT
INQWIRE 1.5J .035X260CM (WIRE) ×2
KIT ENCORE 26 ADVANTAGE (KITS) ×1 IMPLANT
KIT HEART LEFT (KITS) ×2 IMPLANT
PACK CARDIAC CATHETERIZATION (CUSTOM PROCEDURE TRAY) ×2 IMPLANT
SHEATH PROBE COVER 6X72 (BAG) ×1 IMPLANT
TRANSDUCER W/STOPCOCK (MISCELLANEOUS) ×2 IMPLANT
TUBING CIL FLEX 10 FLL-RA (TUBING) ×2 IMPLANT
VALVE GUARDIAN II ~~LOC~~ HEMO (MISCELLANEOUS) ×1 IMPLANT
WIRE ASAHI PROWATER 180CM (WIRE) ×1 IMPLANT

## 2021-08-28 NOTE — Progress Notes (Signed)
Pt doing well. Pharmacy teaching completed. Pt has plavix. C ardiac rehab teaching completed. (R) radial site WNL with Dsg CDI and arm board

## 2021-08-28 NOTE — Progress Notes (Signed)
CARDIAC REHAB PHASE I   PTCA education completed with pt and wife. Pt educated on importance of ASA and Plavix. Pt given heart healthy and diabetic diets. Reviewed site care, restrictions, and exercise guidelines. Will refer to CRP II [place].  [id number] Rufina Falco, RN BSN 08/28/2021 2:57 PM

## 2021-08-28 NOTE — Interval H&P Note (Signed)
History and Physical Interval Note:  08/28/2021 11:08 AM  Anthony [name]  has presented today for surgery, with the diagnosis of chest pain, cad.  The various methods of treatment have been discussed with the patient and family. After consideration of risks, benefits and other options for treatment, the patient has consented to  Procedure(s): LEFT HEART CATH AND CORONARY ANGIOGRAPHY (N/A) as a surgical intervention.  The patient's history has been reviewed, patient examined, no change in status, stable for surgery.  I have reviewed the patient's chart and labs.  Questions were answered to the patient's satisfaction.    2016/2017 Appropriate Use Criteria for Coronary Revascularization Symptom Status: Ischemic Symptoms  Non-invasive Testing: Intermediate Risk  If no or indeterminate stress test, FFR/iFR results in all diseased vessels: N/A  Diabetes Mellitus: Yes  S/P CABG: No  Antianginal therapy (number of long-acting drugs): >=2  Patient undergoing renal transplant: No  Patient undergoing percutaneous valve procedure: No  1 Vessel Disease PCI CABG  No proximal LAD involvement, No proximal left dominant LCX involvement A (8); Indication 2 M (6); Indication 2  Proximal left dominant LCX involvement A (8); Indication 5 A (8); Indication 5  Proximal LAD involvement A (8); Indication 5 A (8); Indication 5  2 Vessel Disease  No proximal LAD involvement A (8); Indication 8 A (7); Indication 8  Proximal LAD involvement A (8); Indication 14 A (9); Indication 14  3 Vessel Disease  Low disease complexity (e.g., focal stenoses, SYNTAX <=22) A (7); Indication 19 A (9); Indication 19  Intermediate or high disease complexity (e.g., SYNTAX >=23) M (6); Indication 23 A (9); Indication 23  Left Main Disease  Isolated LMCA disease: ostial or midshaft A (7); Indication 24 A (9); Indication 24  Isolated LMCA disease: bifurcation involvement M (6); Indication 25 A (9); Indication 25  LMCA ostial or midshaft,  concurrent low disease burden multivessel disease (e.g., 1-2 additional focal stenoses, SYNTAX <=22) A (7); Indication 26 A (9); Indication 26  LMCA ostial or midshaft, concurrent intermediate or high disease burden multivessel disease (e.g., 1-2 additional bifurcation stenoses, long stenoses, SYNTAX >=23) M (4); Indication 27 A (9); Indication 27  LMCA bifurcation involvement, concurrent low disease burden multivessel disease (e.g., 1-2 additional focal stenoses, SYNTAX <=22) M (6); Indication 28 A (9); Indication 28  LMCA bifurcation involvement, concurrent intermediate or high disease burden multivessel disease (e.g., 1-2 additional bifurcation stenoses, long stenoses, SYNTAX >=23) R (3); Indication 29 A (9); Indication [place]

## 2021-08-28 NOTE — Discharge Instructions (Signed)
Radial Site Care  This sheet gives you information about how to care for yourself after your procedure. Your health care provider may also give you more specific instructions. If you have problems or questions, contact your health care provider. What can I expect after the procedure? After the procedure, it is common to have: Bruising and tenderness at the catheter insertion area. Follow these instructions at home: Medicines Take over-the-counter and prescription medicines only as told by your health care provider. Insertion site care Follow instructions from your health care provider about how to take care of your insertion site. Make sure you: Wash your hands with soap and water before you remove your bandage (dressing). If soap and water are not available, use hand sanitizer. May remove dressing in 24 hours. Check your insertion site every day for signs of infection. Check for: Redness, swelling, or pain. Fluid or blood. Pus or a bad smell. Warmth. Do no take baths, swim, or use a hot tub for 5 days. You may shower 24-48 hours after the procedure. Remove the dressing and gently wash the site with plain soap and water. Pat the area dry with a clean towel. Do not rub the site. That could cause bleeding. Do not apply powder or lotion to the site. Activity  For 24 hours after the procedure, or as directed by your health care provider: Do not flex or bend the affected arm. Do not push or pull heavy objects with the affected arm. Do not drive yourself home from the hospital or clinic. You may drive 24 hours after the procedure. Do not operate machinery or power tools. KEEP ARM ELEVATED THE REMAINDER OF THE DAY. Do not push, pull or lift anything that is heavier than 10 lb for 5 days. Ask your health care provider when it is okay to: Return to work or school. Resume usual physical activities or sports. Resume sexual activity. General instructions If the catheter site starts to  bleed, raise your arm and put firm pressure on the site. If the bleeding does not stop, get help right away. This is a medical emergency. DRINK PLENTY OF FLUIDS FOR THE NEXT 2-3 DAYS. No alcohol consumption for 24 hours after receiving sedation. If you went home on the same day as your procedure, a responsible adult should be with you for the first 24 hours after you arrive home. Keep all follow-up visits as told by your health care provider. This is important. Contact a health care provider if: You have a fever. You have redness, swelling, or yellow drainage around your insertion site. Get help right away if: You have unusual pain at the radial site. The catheter insertion area swells very fast. The insertion area is bleeding, and the bleeding does not stop when you hold steady pressure on the area. Your arm or hand becomes pale, cool, tingly, or numb. These symptoms may represent a serious problem that is an emergency. Do not wait to see if the symptoms will go away. Get medical help right away. Call your local emergency services (911 in the [place] Do not drive yourself to the hospital. Summary After the procedure, it is common to have bruising and tenderness at the site. Follow instructions from your health care provider about how to take care of your radial site wound. Check the wound every day for signs of infection.  This information is not intended to replace advice given to you by your health care provider. Make sure you discuss any questions you have with   your health care provider. Document Revised: 02/12/2017 Document Reviewed: 02/12/2017 Elsevier Patient Education  2020 [employer]  

## 2021-08-28 NOTE — H&P (Signed)
OV 08/10/2021 copied for documentation     Patient referred by Tisovec, Fransico Him, MD for chest pain  Subjective:   Anthony [name], male    DOB: 08-19-1945, 76 y.o.   MRN: [id number]   Chief Complaint  Patient presents with   Coronary Artery Disease   Follow-up   Results   76 y.o. Caucasian male with hypertension, hyperlipidemia, mod nonobstructive CAD, type 2 DM, OSA on CPAP, hypothyroidism, GERD  Patient was last seen in the office 07/18/2021 at which time given complaints of chest pain and dyspnea ordered stress test and echocardiogram as well as BNP.  BMP was normal limits.  Results of echocardiogram are pending, however Lexiscan nuclear stress test revealed ischemia in the LAD and LCx distribution. He now presents for follow up. Patient continues to have both exertional chest pain and dyspnea quickly relieved with rest.  Patient's blood pressure remains soft, he reports compliance with current medication regimen.   Current Outpatient Medications on File Prior to Visit  Medication Sig Dispense Refill   acetaminophen (TYLENOL) 500 MG tablet Take 500 mg by mouth every 6 (six) hours as needed.     allopurinol (ZYLOPRIM) 300 MG tablet Take 300 mg by mouth daily.      Alogliptin Benzoate 25 MG TABS Take 25 mg by mouth daily.     amLODipine (NORVASC) 5 MG tablet Take 1 tablet (5 mg total) by mouth daily. 30 tablet 3   aspirin 81 MG EC tablet TAKE 1 TABLET (81 MG TOTAL) BY MOUTH DAILY. SWALLOW WHOLE. 90 tablet 3   CAPSAICIN EX Apply 1 application topically in the morning and at bedtime.     carbidopa-levodopa (SINEMET IR) 25-100 MG tablet TAKE 1 TABLET BY MOUTH THREE TIMES A DAY 270 tablet 1   colchicine 0.6 MG tablet Take 0.6 mg by mouth daily as needed (for gout).     cyclobenzaprine (FLEXERIL) 5 MG tablet Take 1 tablet (5 mg total) by mouth 2 (two) times daily as needed for muscle spasms. 10 tablet 0   divalproex (DEPAKOTE) 250 MG DR tablet Take 250 mg by mouth at bedtime.      DULoxetine (CYMBALTA) 30 MG capsule Take 1 capsule by mouth daily.     empagliflozin (JARDIANCE) 10 MG TABS tablet Take 10 mg by mouth daily.      esomeprazole (NEXIUM) 40 MG capsule TAKE ONE CAPSULE BY MOUTH TWICE A DAY FOR ACID REFLUX.(TAKE ON AN EMPTY STOMACH 30 MINUTES PRIOR TO A MEAL)(APPROVED)     furosemide (LASIX) 40 MG tablet Take 40 mg by mouth daily.     gabapentin (NEURONTIN) 100 MG capsule Take 100 mg by mouth daily.     gabapentin (NEURONTIN) 600 MG tablet Take 600 mg by mouth 3 (three) times daily.     ibuprofen (ADVIL) 200 MG tablet Take 200-400 mg by mouth as needed. Three times a week     isosorbide mononitrate (IMDUR) 60 MG 24 hr tablet TAKE 1 TABLET BY MOUTH EVERY DAY 90 tablet 3   ketoconazole (NIZORAL) 2 % cream Apply 1 application topically daily as needed for irritation (yeast irritation).      lisinopril (ZESTRIL) 10 MG tablet Take 1 tablet (10 mg total) by mouth daily. 90 tablet 3   metoprolol succinate (TOPROL-XL) 50 MG 24 hr tablet TAKE 1 TABLET BY MOUTH DAILY. TAKE WITH OR IMMEDIATELY FOLLOWING A MEAL. 90 tablet 3   metroNIDAZOLE (METROGEL) 0.75 % gel Apply 1 application topically daily as needed (Skin rash on  face).      Mirabegron (MYRBETRIQ PO) Take 1 tablet by mouth daily.     nitroGLYCERIN (NITROSTAT) 0.4 MG SL tablet Place 1 tablet (0.4 mg total) under the tongue every 5 (five) minutes as needed for chest pain. 30 tablet 3   OVER THE COUNTER MEDICATION Apply 1 application topically daily as needed (Leg cramps). Thera Wox     oxybutynin (DITROPAN) 5 MG tablet Take 5 mg by mouth at bedtime.     pantoprazole (PROTONIX) 40 MG tablet Take protonix 40 mg twice daily for 2 months.  Resume once per day after that. 60 tablet 0   Polyethyl Glycol-Propyl Glycol (SYSTANE) 0.4-0.3 % SOLN Place 1 drop into both eyes daily as needed (Dry eye).     polyethylene glycol (MIRALAX / GLYCOLAX) 17 g packet Take 17 g by mouth daily as needed for mild constipation or moderate  constipation.     pyridOXINE (VITAMIN B-6) 100 MG tablet Take 100 mg by mouth daily.     rosuvastatin (CRESTOR) 20 MG tablet TAKE 1 TABLET BY MOUTH EVERY DAY 90 tablet 3   SYNTHROID 25 MCG tablet Take 25 mcg by mouth daily.      tamsulosin (FLOMAX) 0.4 MG CAPS capsule Take 0.4 mg by mouth every evening.      TOUJEO SOLOSTAR 300 UNIT/ML SOPN Inject 50 Units into the skin at bedtime.  3   triamcinolone cream (KENALOG) 0.1 % Apply 1 application topically at bedtime.     fluticasone (FLONASE) 50 MCG/ACT nasal spray Place 1 spray into both nostrils daily. 16 g 0   insulin glargine-yfgn (SEMGLEE) 100 UNIT/ML Pen Inject 50 Units into the skin daily.     No current facility-administered medications on file prior to visit.    Cardiovascular studies: Echocardiogram 08/10/2021: Pending  Lexiscan Tetrofosmin stress test 08/01/2021: 1 Day Rest/Stress protocol.  Scheduled as an exercise nuclear stress test.  Exercise time 6 minutes 3 seconds, achieved 7.05 METS, developed 7 out of 10 chest pain, 70% age-predicted maximum heart rate, hypertensive response to exercise (Rest BP 112/80, peak 200/60). Patient was converted to pharmacological stress test. Stress EKG is non-diagnostic, target heart rate not achieved. Medium size, mild intensity reversible perfusion defect suggestive of ischemia in the distal LAD distribution. Medium size, mild intensity reversible perfusion defect suggestive of ischemia in the LCx distribution. Left ventricular size normal, wall thickness preserved, calculated LVEF 60%. Prior study dated 08/05/2018 reported small size, mild intensity reversible perfusion defect suggestive of ischemia in the LCx/PDA distribution, calculated LVEF 62%. Intermediate risk & clinical correlation required.  EKG 07/10/2021: Normal sinus rhythm rate of 65 bpm.  Normal axis.  No evidence of ischemia or underlying injury pattern.  Compared EKG 12/06/2020, no significant change.  Coronary angiography  05/18/2019: Moderate diffuse ectasia involving specifically the LAD and also the right coronary artery with diffuse moderate amount of calcification, 50% stepdown post ectatic segment, slow flow which improved with intracoronary nitroglycerin to brisk flow. 50% mid ramus intermediate disease again mild ectasia noted. Recommendation: Patient already on isosorbide mononitrate, I will add amlodipine 5 mg daily.  Weight loss, exercise to improve endothelial function and control of diabetes recommended.  50 mill contrast utilized.  Lexiscan Myoview stress test 08/05/2018: Lexiscan stress test was performed. Stress EKG is non-diagnostic, as this is pharmacological stress test. SPECT imaging reveals small sized, mild intensity, reversible perfusion defect in mid to basal inferior/inferolateral myocardium, suggesting possible LCx/PDA territory ischemia. LVEF 62%. Low risk study.   Echocardiogram 07/23/2018:  1. The left ventricle has normal systolic function, with an ejection fraction of 55-60%. The cavity size was normal. Left ventricular diastolic Doppler parameters are consistent with impaired relaxation.  2. The right ventricle has normal systolic function. The cavity was normal. There is no increase in right ventricular wall thickness.  3. The aortic valve is tricuspid. Mild calcification of the aortic valve. Trace aortic stenosis. of the aortic valve.  4. No other significant valvular abnormalities seen.  CT Chest 07/14/2018: 1. No evidence of pulmonary embolism, although evaluation of the segmental pulmonary arteries is limited due to motion artifact. 2. Diffuse mosaic attenuation of the lungs, nonspecific, but possibly reflecting small airways disease. 3.  Aortic atherosclerosis (ICD10-I70.0).  Recent labs:    Latest Ref Rng & Units 12/18/2020    7:53 AM 11/07/2020    3:27 PM 08/17/2019    4:43 PM  CMP  Glucose 70 - 99 mg/dL 107  87  108   BUN 8 - 27 mg/dL _0 Creatinine 0.76 -  1.27 mg/dL 1.10  1.01  1.07   Sodium 134 - 144 mmol/L 139  139  136   Potassium 3.5 - 5.2 mmol/L 4.6  5.9  4.3   Chloride 96 - 106 mmol/L 100  99  102   CO2 20 - 29 mmol/L _1 Calcium 8.6 - 10.2 mg/dL 9.7  10.2  9.5   Total Protein 6.0 - 8.5 g/dL  7.6    Total Bilirubin 0.0 - 1.2 mg/dL  0.3    Alkaline Phos 44 - 121 IU/L  111    AST 0 - 40 IU/L  38    ALT 0 - 44 IU/L  35        Latest Ref Rng & Units 08/17/2019    4:43 PM 05/14/2019    8:32 AM 03/17/2010   11:45 AM  CBC  WBC 4.0 - 10.5 K/uL 11.5  8.9    Hemoglobin 13.0 - 17.0 g/dL 14.7  14.9  15.6   Hematocrit 39.0 - 52.0 % 46.0  45.5  46.0   Platelets 150 - 400 K/uL 290  388     Lipid Panel     Component Value Date/Time   CHOL 145 12/18/2020 0753   TRIG 197 (H) 12/18/2020 0753   HDL 33 (L) 12/18/2020 0753   CHOLHDL 4.0 04/26/2019 0837   LDLCALC 79 12/18/2020 0753   HEMOGLOBIN A1C No results found for: "HGBA1C", "MPG" TSH No results for input(s): "TSH" in the last 8760 hours.  BNP    Component Value Date/Time   BNP 34.7 07/25/2021 0806    ProBNP No results found for: "PROBNP"  Other Labs: 11/07/2020: Glucose 87, BUN/Cr 15/1.01. EGFR 78. Na/K 139/5.9. Rest of the CMP normal  08/17/2019: Glucose 108, BUN/Cr 22/1.07. EGFR >60. Na/K 136/4.3.  H/H 14/46. MCV 89. Platelets 290  05/18/2019:  BUN/Cr 17/1.17.  Na/K 138/4.1. Glucose 81. EGFR 60. Rest of the CMP normal H/H 14.9/. MCV 86. Platelets 388.  04/26/2019: Chol 147, TG 149, HDL 37, LDL 84  03/23/2019: A1C 6.700 % TSH 1.77  Review of Systems  Cardiovascular:  Positive for chest pain (with exertion) and dyspnea on exertion. Negative for leg swelling, palpitations and syncope.         Vitals:   08/10/21 1048  BP: 108/62  Pulse: 67  Resp: 17  Temp: 98 F (36.7 C)  SpO2: 98%   Orthostatic VS for the past  72 hrs (Last 3 readings):  Patient Position BP Location Cuff Size  08/10/21 1048 Sitting Left Arm Normal    Body mass index is 34.67  kg/m. Filed Weights   08/10/21 1048  Weight: 241 lb 9.6 oz (109.6 kg)     Objective:   Physical Exam Vitals reviewed.  Constitutional:      Appearance: He is well-developed. He is obese.  Neck:     Vascular: No JVD.  Cardiovascular:     Rate and Rhythm: Normal rate and regular rhythm.     Pulses: Intact distal pulses.     Heart sounds: Murmur heard.     Systolic murmur is present with a grade of 2/6 at the upper right sternal border.  Pulmonary:     Effort: Pulmonary effort is normal.     Breath sounds: Normal breath sounds. No wheezing or rales.   Physical exam unchanged compared to previous office visit.        Assessment & Recommendations:   76 y.o. Caucasian male with hypertension, hyperlipidemia, mod nonobstructive CAD, type 2 DM, OSA on CPAP, hypothyroidism, GERD  CAD: Reviewed and discussed results of stress test revealing clear reversible ischemia in the LAD and LCx distribution, details above.  Patient continues to have symptoms of exertional chest pain and dyspnea.  Given stress test findings and continued anginal symptoms recommend repeat coronary angiography.  The left heart catheterization procedure was explained to the patient in detail. The indication, alternatives, risks and benefits were reviewed. Complications including but not limited to bleeding, infection, acute kidney injury, blood transfusion, heart rhythm disturbances, contrast (dye) reaction, damage to the arteries or nerves in the legs or hands, cerebrovascular accident, myocardial infarction, need for emergent bypass surgery, blood clots in the legs, possible need for emergent blood transfusion, and rarely death were reviewed and discussed with the patient. The patient voices understanding and wishes to proceed.  Continue aspirin, statin, metoprolol, amlodipine, Imdur. Lipids are well controlled Echo results pending   Aortic murmur:  Echocardiogram results pending   Hypertension: Patient's  blood pressure remains soft. Continue present medications.   Type 2 diabetes mellitus without complication: We will defer management to PCP.  Mixed hyperlipidemia: Continue statin therapy  Follow-up in after cardiac catheterization.   Patient was seen in collaboration with Dr. Virgina Jock and he is in agreement with the plan.    Alethia Berthold, PA-C 08/10/2021, 11:15 AM Office: [phone]

## 2021-08-29 ENCOUNTER — Encounter (HOSPITAL_COMMUNITY): Payer: Self-pay | Admitting: Cardiology

## 2021-08-29 MED FILL — Nitroglycerin IV Soln 100 MCG/ML in D5W: INTRA_ARTERIAL | Qty: 10 | Status: AC

## 2021-09-04 ENCOUNTER — Telehealth (HOSPITAL_COMMUNITY): Payer: Self-pay

## 2021-09-04 NOTE — Progress Notes (Unsigned)
PATIENT: Anthony [name] DOB: 1945-04-27  REASON FOR VISIT: follow up HISTORY FROM: patient PRIMARY NEUROLOGIST:   Chief Complaint  Patient presents with   Follow-up    Pt in 9  Pt here for CPAP f/u   Pt states CPAP is fine no issues . Pt states last month Dr Vickey Huger spoke to him about  being tested for Parkinson's last appointment Pt states increased tremors in left arm and hand  Pt has tremors in right hand also but left hand is worse Pt states losses his balance easily.     HISTORY OF PRESENT ILLNESS: Today 09/05/21:  Anthony [name] is a 76 year old male with a history of obstructive sleep apnea on CPAP and Parkinson disease.  He returns today for follow-up.   OSA on CPAP: no issues, DL is below   Parkinsonism: No falls but feels off balance. Feels that tremor in the left hand is worse. Notices it at both rest and with action but more with action. Some trouble swallowing food. Feels that it gets stuck. Had his esophagus stretched a year ago. Had swallow test completed at [facility] 2 years ago.  His mental is no longer list Sinemet but he does think he is taking it twice a day.  He will check his list at home.  He may be interested in doing the DaTscan.    REVIEW OF SYSTEMS: Out of a complete 14 system review of symptoms, the patient complains only of the following symptoms, and all other reviewed systems are negative.  FSS 40 ESS 18  ALLERGIES: Allergies  Allergen Reactions   Carbidopa-Levodopa Other (See Comments)    Made skin crawl    Lyrica [Pregabalin]     Delirium, Dizziness, Drowsy   Metformin Diarrhea    HOME MEDICATIONS: Outpatient Medications Prior to Visit  Medication Sig Dispense Refill   acetaminophen (TYLENOL) 500 MG tablet Take 500 mg by mouth every 6 (six) hours as needed for moderate pain.     allopurinol (ZYLOPRIM) 300 MG tablet Take 300 mg by mouth daily.      Alogliptin Benzoate 25 MG TABS Take 25 mg by mouth daily.     amLODipine (NORVASC) 2.5 MG  tablet Take 2.5 mg by mouth daily.     Ascorbic Acid (VITAMIN C) 1000 MG tablet Take 1,000 mg by mouth daily.     aspirin 81 MG EC tablet TAKE 1 TABLET (81 MG TOTAL) BY MOUTH DAILY. SWALLOW WHOLE. 90 tablet 3   Capsaicin 0.05 % CREA Apply 1 application  topically at bedtime. Apply to feet     carboxymethylcellulose (REFRESH PLUS) 0.5 % SOLN Place 1 drop into both eyes 3 (three) times daily as needed (dry eyes).     clopidogrel (PLAVIX) 75 MG tablet Take 1 tablet (75 mg total) by mouth daily with breakfast. 30 tablet 1   colchicine 0.6 MG tablet Take 0.6 mg by mouth daily as needed (for gout).     cyclobenzaprine (FLEXERIL) 5 MG tablet Take 1 tablet (5 mg total) by mouth 2 (two) times daily as needed for muscle spasms. 10 tablet 0   divalproex (DEPAKOTE) 250 MG DR tablet Take 250 mg by mouth at bedtime.     DULoxetine (CYMBALTA) 30 MG capsule Take 30 mg by mouth daily.     empagliflozin (JARDIANCE) 10 MG TABS tablet Take 10 mg by mouth daily.      fluticasone (FLONASE) 50 MCG/ACT nasal spray Place 1 spray into both nostrils daily. (Patient taking differently:  Place 1 spray into both nostrils daily as needed for allergies.) 16 g 0   furosemide (LASIX) 40 MG tablet Take 40 mg by mouth daily.     gabapentin (NEURONTIN) 100 MG capsule Take 100 mg by mouth at bedtime. Take with 600 mg tablet     gabapentin (NEURONTIN) 600 MG tablet Take 600 mg by mouth 3 (three) times daily.     ibuprofen (ADVIL) 200 MG tablet Take 200-400 mg by mouth every 8 (eight) hours as needed for moderate pain. Three times a week     insulin glargine-yfgn (SEMGLEE) 100 UNIT/ML Pen Inject 50 Units into the skin at bedtime.     isosorbide mononitrate (IMDUR) 60 MG 24 hr tablet TAKE 1 TABLET BY MOUTH EVERY DAY 90 tablet 3   ketoconazole (NIZORAL) 2 % cream Apply 1 application topically daily as needed for irritation (yeast irritation).      lisinopril (ZESTRIL) 40 MG tablet Take 40 mg by mouth daily.     metoprolol succinate  (TOPROL-XL) 50 MG 24 hr tablet TAKE 1 TABLET BY MOUTH DAILY. TAKE WITH OR IMMEDIATELY FOLLOWING A MEAL. 90 tablet 3   metroNIDAZOLE (METROGEL) 0.75 % gel Apply 1 application topically daily as needed (Skin rash on face).      mirabegron ER (MYRBETRIQ) 50 MG TB24 tablet Take 50 mg by mouth daily.     nitroGLYCERIN (NITROSTAT) 0.4 MG SL tablet Place 0.4 mg under the tongue every 5 (five) minutes as needed for chest pain.     OVER THE COUNTER MEDICATION Apply 1 application  topically daily as needed (Leg cramps). Thera Worx     oxybutynin (DITROPAN) 5 MG tablet Take 5 mg by mouth at bedtime.     pantoprazole (PROTONIX) 40 MG tablet Take protonix 40 mg twice daily for 2 months.  Resume once per day after that. (Patient taking differently: Take 40 mg by mouth 2 (two) times daily before a meal.) 60 tablet 0   polyethylene glycol (MIRALAX / GLYCOLAX) 17 g packet Take 17 g by mouth daily as needed for mild constipation or moderate constipation.     pyridOXINE (VITAMIN B-6) 100 MG tablet Take 100 mg by mouth daily.     rosuvastatin (CRESTOR) 20 MG tablet TAKE 1 TABLET BY MOUTH EVERY DAY 90 tablet 3   SYNTHROID 25 MCG tablet Take 25 mcg by mouth daily before breakfast.     tamsulosin (FLOMAX) 0.4 MG CAPS capsule Take 0.4 mg by mouth every evening.      White Petrolatum-Mineral Oil (REFRESH LACRI-LUBE OP) Place 1 drop into both eyes at bedtime.     No facility-administered medications prior to visit.    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   Obesity (BMI 30.0-34.9) 05/14/2013   OSA on CPAP    cpap setting of 10/ 13   Pancreatitis dx march 2016   Pneumonia 12 years ago    PAST  SURGICAL HISTORY: Past Surgical History:  Procedure Laterality Date   BACK SURGERY  10/2009   Cervical, arterior   CARPAL TUNNEL RELEASE Left 2003   CARPAL TUNNEL RELEASE  Bilateral    CATARACT EXTRACTION Bilateral 01/2012   CORONARY BALLOON ANGIOPLASTY N/A 08/28/2021   Procedure: CORONARY BALLOON ANGIOPLASTY;  Surgeon: Elder Negus, MD;  Location: [facility];  Service: Cardiovascular;  Laterality: N/A;   EUS N/A 07/15/2014   Procedure: FULL UPPER ENDOSCOPIC ULTRASOUND (EUS) RADIAL;  Surgeon: Jeani Hawking, MD;  Location: [facility];  Service: Endoscopy;  Laterality: N/A;   LEFT HEART CATH AND CORONARY ANGIOGRAPHY N/A 05/18/2019   Procedure: LEFT HEART CATH AND CORONARY ANGIOGRAPHY;  Surgeon: Yates Decamp, MD;  Location: [facility];  Service: Cardiovascular;  Laterality: N/A;   LEFT HEART CATH AND CORONARY ANGIOGRAPHY N/A 08/28/2021   Procedure: LEFT HEART CATH AND CORONARY ANGIOGRAPHY;  Surgeon: Elder Negus, MD;  Location: [facility];  Service: Cardiovascular;  Laterality: N/A;   NASAL SINUS SURGERY  1981   RETINAL Bilateral 06/2013   Retinal peel   SHOULDER SURGERY Right 2003    FAMILY HISTORY: Family History  Problem Relation Age of Onset   Heart disease Mother    Diabetes Mother    Neuropathy Mother    Colon cancer Father    Diabetes Sister    Lung cancer Brother    Sleep apnea Brother    Colon cancer Brother 7   Diabetes Maternal Grandmother    Heart attack Maternal Grandmother    Colon cancer Maternal Grandfather    Heart Problems Maternal Grandfather    Prostate cancer Maternal Grandfather    Cancer Maternal Grandfather        right eye   Heart attack Maternal Grandfather    Stomach cancer Neg Hx    Ulcerative colitis Neg Hx    Esophageal cancer Neg Hx    Parkinson's disease Neg Hx     SOCIAL HISTORY: Social History   Socioeconomic History   Marital status: Married    Spouse name: Rosey Bath   Number of children: 2   Years of  education: 12   Highest education level: Not on file  Occupational History   Occupation: [employer]    Employer: [employer]  Tobacco Use   Smoking status: Never   Smokeless tobacco: Never  Vaping Use   Vaping Use: Never used  Substance and Sexual Activity   Alcohol use: No   Drug use: No   Sexual activity: Not on file  Other Topics Concern   Not on file  Social History Narrative   Patient is married Rosey Bath) and lives at home with his wife.   Patient has two children (twins).   Patient is retired.   Patient has a high school education.   Patient does not drink any caffeine.   Patient is left-handed.   Social Determinants of Health   Financial Resource Strain: Not on file  Food Insecurity: No Food Insecurity (03/18/2019)   Hunger Vital Sign    Worried About Running Out of Food in the Last Year: Never true    Ran Out of Food in the Last Year: Never true  Transportation Needs: No Transportation Needs (03/18/2019)   PRAPARE - Administrator, Civil Service (Medical): No    Lack of Transportation (Non-Medical): No  Physical Activity: Not on file  Stress: Not on file  Social Connections: Not on file  Intimate Partner Violence: Not on file      PHYSICAL EXAM  Vitals:   09/05/21 1324  BP: (!) 102/53  Pulse: (!) 57  Weight: 239 lb 3.2 oz (108.5 kg)  Height:  5\' 10"  (1.778 m)   Body mass index is 34.32 kg/m.  Generalized: Well developed, in no acute distress   Neurological examination  Mentation: Alert oriented to time, place, history taking. Follows all commands speech and language fluent Cranial nerve II-XII: Fundoscopic exam reveals sharp disc margins.Pupils were equal round reactive to light extraocular movements were full, visual field were full on confrontational test. Facial sensation and strength were normal. Head turning and shoulder shrug  were normal and symmetric. Motor: The motor testing reveals 5 over 5 strength of all 4 extremities. Good  symmetric motor tone is noted throughout.  Sensory: Sensory testing is intact  on all 4 extremities. No evidence of extinction is noted.  Coordination: Cerebellar testing reveals good finger-nose-finger and heel-to-shin bilaterally.  Resting tremor not noted.  Action tremor noted in the left hand Gait and station: Patient is able to stand without assistance.  Good stride and arm swing.  2 steps with turns       DIAGNOSTIC DATA (LABS, IMAGING, TESTING) - I reviewed patient records, labs, notes, testing and imaging myself where available.  Lab Results  Component Value Date   WBC 9.2 08/20/2021   HGB 15.0 08/20/2021   HCT 46.7 08/20/2021   MCV 88 08/20/2021   PLT 254 08/20/2021      Component Value Date/Time   NA 139 08/20/2021 0851   K 4.8 08/20/2021 0851   CL 100 08/20/2021 0851   CO2 24 08/20/2021 0851   GLUCOSE 134 (H) 08/20/2021 0851   GLUCOSE 108 (H) 08/17/2019 1643   BUN 16 08/20/2021 0851   CREATININE 1.11 08/20/2021 0851   CALCIUM 9.4 08/20/2021 0851   PROT 7.6 11/07/2020 1527   ALBUMIN 4.5 11/07/2020 1527   AST 38 11/07/2020 1527   ALT 35 11/07/2020 1527   ALKPHOS 111 11/07/2020 1527   BILITOT 0.3 11/07/2020 1527   GFRNONAA >60 08/17/2019 1643   GFRAA >60 08/17/2019 1643   Lab Results  Component Value Date   CHOL 145 12/18/2020   HDL 33 (L) 12/18/2020   LDLCALC 79 12/18/2020   TRIG 197 (H) 12/18/2020   CHOLHDL 4.0 04/26/2019      ASSESSMENT AND PLAN 76 y.o. year old male  has a past medical history of BPH (benign prostatic hyperplasia), Diabetes mellitus type 2, controlled (HCC), Fatty liver, GERD (gastroesophageal reflux disease), Gout, Heart disease, HTN (hypertension), Hyperlipidemia, Hypothyroidism, Nasal septal deformity (05/14/2013), Obesity (BMI 30.0-34.9) (05/14/2013), OSA on CPAP, Pancreatitis (dx march 2016), and Pneumonia (12 years ago). here with:  OSA on CPAP  - CPAP compliance excellent - Good treatment of AHI  - Encourage patient to use  CPAP nightly and > 4 hours each night  2.  Parkinsonism  -Patient will check his prescriptions at home and verify that he is taking Sinemet.  If he is taking Sinemet 25-100 mg twice a day he will try increasing to 3 times a day to see if he notices any benefit. -Discussed DaTscan the patient will discuss this with his wife.   - F/U in 3 months or sooner if needed with Dr. Vergia Alcon, MSN, NP-C 09/05/2021, 1:40 PM [facility] [address] [place], [place] [address] [phone]

## 2021-09-04 NOTE — Telephone Encounter (Signed)
Called patient to see if he is interested in the Cardiac Rehab Program. Patient expressed interest. Explained scheduling process and went over insurance, patient verbalized understanding. Will contact patient for scheduling once f/u has been completed.  °

## 2021-09-04 NOTE — Telephone Encounter (Signed)
Pt insurance is active and benefits verified through [employer]. Co-pay 0, DED 0/0 met, out of pocket $5,000/$333.78 met, co-insurance 0%. no pre-authorization required Tammy/[employer] 09/04/2021_0 :09pm, REF# [id number]   How many CR sessions are covered? (72 sessions for ICR) 72 Has the member used any of these services to date? no Is there a time limit (weeks/months) on start of program and/or program completion? no   Will contact patient to see if he is interested in the Cardiac Rehab Program. If interested, patient will need to complete follow up appt. Once completed, patient will be contacted for scheduling upon review by the RN Navigator.

## 2021-09-05 ENCOUNTER — Telehealth: Payer: Self-pay | Admitting: Adult Health

## 2021-09-05 ENCOUNTER — Encounter: Payer: Self-pay | Admitting: Adult Health

## 2021-09-05 ENCOUNTER — Ambulatory Visit: Payer: HMO | Admitting: Adult Health

## 2021-09-05 VITALS — BP 102/53 | HR 57 | Ht 70.0 in | Wt 239.2 lb

## 2021-09-05 DIAGNOSIS — Z9989 Dependence on other enabling machines and devices: Secondary | ICD-10-CM

## 2021-09-05 DIAGNOSIS — G20 Parkinson's disease: Secondary | ICD-10-CM

## 2021-09-05 DIAGNOSIS — G4733 Obstructive sleep apnea (adult) (pediatric): Secondary | ICD-10-CM | POA: Diagnosis not present

## 2021-09-05 DIAGNOSIS — G20C Parkinsonism, unspecified: Secondary | ICD-10-CM

## 2021-09-05 NOTE — Patient Instructions (Addendum)
-   Check at home to see if you are taking sinemet 25-100 mg. If you are taking it twice a day, you can increase to three times a day  - Continue CPAP therapy  -Consider DaTscan

## 2021-09-05 NOTE — Telephone Encounter (Signed)
Pt states he was asked by Jinny Blossom, NP to call back [id number] 25/'100MG'$ .  Pt confirms that he does not have this medication.  Pt uses [facility]: [id number]

## 2021-09-06 NOTE — Telephone Encounter (Signed)
I called pt and relayed that no other med options considered at this time but per Jinny Blossom, NP would consider dat scan .  I offered to mail information to pt and he appreciated this. I also stated could look on google about it as well.  He appreciated call back.

## 2021-09-06 NOTE — Telephone Encounter (Signed)
I called pharmacy pt had gotten a 90 day supply 06-15-2021 at [facility].  He states that he has taken this and did not have issue with skin crawling sensation initially but has noticed that it had gotten progressively worse so he stopped taking the CL 25/100 about 2 months ago.  The crawling sensation has resolved since he has not taken.  [facility] rest so he would not take.  What other options for him?

## 2021-09-06 NOTE — Telephone Encounter (Signed)
I would consider DaTscan so we can conclude if this is parkinson's.

## 2021-09-06 NOTE — Telephone Encounter (Signed)
Does it want to try it? Or is there some reason he stopped it?

## 2021-09-10 NOTE — Telephone Encounter (Signed)
Yes do we have to fill out form? Or just place the order?

## 2021-09-10 NOTE — Addendum Note (Signed)
Addended by: Gildardo Griffes on: 09/10/2021 01:05 PM   Modules accepted: Orders

## 2021-09-11 NOTE — Addendum Note (Signed)
Addended by: Trudie Buckler on: 09/11/2021 10:50 AM   Modules accepted: Orders

## 2021-09-12 ENCOUNTER — Telehealth: Payer: Self-pay | Admitting: Adult Health

## 2021-09-12 NOTE — Telephone Encounter (Signed)
[employer] sent to [facility] nuclear medicine dept

## 2021-09-15 DIAGNOSIS — E1165 Type 2 diabetes mellitus with hyperglycemia: Secondary | ICD-10-CM | POA: Diagnosis not present

## 2021-09-16 ENCOUNTER — Other Ambulatory Visit: Payer: Self-pay | Admitting: Neurology

## 2021-09-16 ENCOUNTER — Other Ambulatory Visit: Payer: Self-pay | Admitting: Cardiology

## 2021-09-16 DIAGNOSIS — E782 Mixed hyperlipidemia: Secondary | ICD-10-CM

## 2021-09-16 DIAGNOSIS — I25118 Atherosclerotic heart disease of native coronary artery with other forms of angina pectoris: Secondary | ICD-10-CM

## 2021-09-19 ENCOUNTER — Ambulatory Visit: Payer: HMO | Admitting: Cardiology

## 2021-09-19 ENCOUNTER — Encounter: Payer: Self-pay | Admitting: Cardiology

## 2021-09-19 VITALS — BP 152/72 | HR 70 | Temp 98.0°F | Resp 16 | Ht 70.0 in | Wt 240.0 lb

## 2021-09-19 DIAGNOSIS — I10 Essential (primary) hypertension: Secondary | ICD-10-CM | POA: Diagnosis not present

## 2021-09-19 DIAGNOSIS — I358 Other nonrheumatic aortic valve disorders: Secondary | ICD-10-CM | POA: Diagnosis not present

## 2021-09-19 DIAGNOSIS — I25118 Atherosclerotic heart disease of native coronary artery with other forms of angina pectoris: Secondary | ICD-10-CM | POA: Diagnosis not present

## 2021-09-19 DIAGNOSIS — E782 Mixed hyperlipidemia: Secondary | ICD-10-CM | POA: Diagnosis not present

## 2021-09-19 NOTE — Progress Notes (Signed)
Patient referred by Haywood Pao, MD for chest pain  Subjective:   Anthony [name], male    DOB: 1945/03/02, 76 y.o.   MRN: [id number]   Chief Complaint  Patient presents with   Coronary Artery Disease   Follow-up    Cath post   76 y.o. Caucasian male with hypertension, hyperlipidemia, CAD, type 2 DM, OSA on CPAP, hypothyroidism, GERD  Patient has complete resolution of angina symptoms since PTCA to mid LAD (08/2021). BP elevated today, but generally well controlled.    Current Outpatient Medications:    acetaminophen (TYLENOL) 500 MG tablet, Take 500 mg by mouth every 6 (six) hours as needed for moderate pain., Disp: , Rfl:    allopurinol (ZYLOPRIM) 300 MG tablet, Take 300 mg by mouth daily. , Disp: , Rfl:    Alogliptin Benzoate 25 MG TABS, Take 25 mg by mouth daily., Disp: , Rfl:    amLODipine (NORVASC) 2.5 MG tablet, Take 2.5 mg by mouth daily., Disp: , Rfl:    Ascorbic Acid (VITAMIN C) 1000 MG tablet, Take 1,000 mg by mouth daily., Disp: , Rfl:    aspirin 81 MG EC tablet, TAKE 1 TABLET (81 MG TOTAL) BY MOUTH DAILY. SWALLOW WHOLE., Disp: 90 tablet, Rfl: 3   Capsaicin 0.05 % CREA, Apply 1 application  topically at bedtime. Apply to feet, Disp: , Rfl:    carboxymethylcellulose (REFRESH PLUS) 0.5 % SOLN, Place 1 drop into both eyes 3 (three) times daily as needed (dry eyes)., Disp: , Rfl:    clopidogrel (PLAVIX) 75 MG tablet, Take 1 tablet (75 mg total) by mouth daily with breakfast., Disp: 30 tablet, Rfl: 1   colchicine 0.6 MG tablet, Take 0.6 mg by mouth daily as needed (for gout)., Disp: , Rfl:    cyclobenzaprine (FLEXERIL) 5 MG tablet, Take 1 tablet (5 mg total) by mouth 2 (two) times daily as needed for muscle spasms., Disp: 10 tablet, Rfl: 0   divalproex (DEPAKOTE) 250 MG DR tablet, Take 250 mg by mouth at bedtime., Disp: , Rfl:    DULoxetine (CYMBALTA) 30 MG capsule, Take 30 mg by mouth daily., Disp: , Rfl:    empagliflozin (JARDIANCE) 10 MG TABS tablet, Take 10 mg by  mouth daily. , Disp: , Rfl:    fluticasone (FLONASE) 50 MCG/ACT nasal spray, Place 1 spray into both nostrils daily. (Patient taking differently: Place 1 spray into both nostrils daily as needed for allergies.), Disp: 16 g, Rfl: 0   furosemide (LASIX) 40 MG tablet, Take 40 mg by mouth daily., Disp: , Rfl:    gabapentin (NEURONTIN) 100 MG capsule, Take 100 mg by mouth at bedtime. Take with 600 mg tablet, Disp: , Rfl:    gabapentin (NEURONTIN) 600 MG tablet, Take 600 mg by mouth 3 (three) times daily., Disp: , Rfl:    ibuprofen (ADVIL) 200 MG tablet, Take 200-400 mg by mouth every 8 (eight) hours as needed for moderate pain. Three times a week, Disp: , Rfl:    insulin glargine-yfgn (SEMGLEE) 100 UNIT/ML Pen, Inject 50 Units into the skin at bedtime., Disp: , Rfl:    isosorbide mononitrate (IMDUR) 60 MG 24 hr tablet, TAKE 1 TABLET BY MOUTH EVERY DAY, Disp: 90 tablet, Rfl: 3   ketoconazole (NIZORAL) 2 % cream, Apply 1 application topically daily as needed for irritation (yeast irritation). , Disp: , Rfl:    lisinopril (ZESTRIL) 40 MG tablet, Take 40 mg by mouth daily., Disp: , Rfl:    metoprolol succinate (  TOPROL-XL) 50 MG 24 hr tablet, TAKE 1 TABLET BY MOUTH DAILY. TAKE WITH OR IMMEDIATELY FOLLOWING A MEAL., Disp: 90 tablet, Rfl: 3   metroNIDAZOLE (METROGEL) 0.75 % gel, Apply 1 application topically daily as needed (Skin rash on face). , Disp: , Rfl:    mirabegron ER (MYRBETRIQ) 50 MG TB24 tablet, Take 50 mg by mouth daily., Disp: , Rfl:    nitroGLYCERIN (NITROSTAT) 0.4 MG SL tablet, Place 0.4 mg under the tongue every 5 (five) minutes as needed for chest pain., Disp: , Rfl:    OVER THE COUNTER MEDICATION, Apply 1 application  topically daily as needed (Leg cramps). Thera Worx, Disp: , Rfl:    oxybutynin (DITROPAN) 5 MG tablet, Take 5 mg by mouth at bedtime., Disp: , Rfl:    pantoprazole (PROTONIX) 40 MG tablet, Take protonix 40 mg twice daily for 2 months.  Resume once per day after that. (Patient  taking differently: Take 40 mg by mouth 2 (two) times daily before a meal.), Disp: 60 tablet, Rfl: 0   polyethylene glycol (MIRALAX / GLYCOLAX) 17 g packet, Take 17 g by mouth daily as needed for mild constipation or moderate constipation., Disp: , Rfl:    pyridOXINE (VITAMIN B-6) 100 MG tablet, Take 100 mg by mouth daily., Disp: , Rfl:    rosuvastatin (CRESTOR) 20 MG tablet, TAKE 1 TABLET BY MOUTH EVERY DAY, Disp: 90 tablet, Rfl: 3   SYNTHROID 25 MCG tablet, Take 25 mcg by mouth daily before breakfast., Disp: , Rfl:    tamsulosin (FLOMAX) 0.4 MG CAPS capsule, Take 0.4 mg by mouth every evening. , Disp: , Rfl:    White Petrolatum-Mineral Oil (REFRESH LACRI-LUBE OP), Place 1 drop into both eyes at bedtime., Disp: , Rfl:   Cardiovascular studies:  EKG 09/19/2021: Sinus rhythm 76 bpm  Normal EKG  Coronary intervention 08/28/2021: LM: Normal LAD: Prox-mid 50% disease (Unchanged compared to cath 2021)         Distal focal 75% stenosis (New since cath 2021) Lcx: Prox-mid 50% disease (Unchanged compared to cath 2021) Ramus: Prox 50% disease (Unchanged compared to cath 2021) RCA: Prox-distal 30% disease (Unchanged compared to cath 2021) Lcx: Prox-mid 50% disease   Successful PTCA distal LAD  w/2.0X12 mm balloon at 6 atm Excellent results, thus stenting deferred.  Echocardiogram 08/10/2021:  Normal LV systolic function with EF 69%. Left ventricle cavity is normal  in size. Moderate concentric hypertrophy of the left ventricle. Normal  global wall motion. Normal diastolic filling pattern. Calculated EF 69%.  Trileaflet aortic valve. Trace aortic stenosis. Trace aortic  regurgitation. Mild aortic valve leaflet calcification. Peak velocity    2.43ms, Peak Pressure Gradient  210mg, Mean Gradient 9.81m57m, AVA  1.51cm, Dimensionless Index 0.5  Structurally normal tricuspid valve with trace regurgitation. No evidence  of pulmonary hypertension.  IVC is dilated with respiratory variation. May  suggest mild elevation in  CVP.   Recent labs: 08/20/2021: Glucose 134, BUN/Cr 16/1.11. EGFR 69. Na/K 139/4.8.  H/H 15/46. MCV 88. Platelets 254  11/2020: Chol 145, TG 197, HDL 33, LDL 79  Review of Systems  Cardiovascular:  Negative for chest pain, dyspnea on exertion, leg swelling, palpitations and syncope.         Vitals:   09/19/21 1154  BP: (!) 152/72  Pulse: 70  Resp: 16  Temp: 98 F (36.7 C)  SpO2: 94%     Body mass index is 34.44 kg/m. Filed Weights   09/19/21 1154  Weight: 240 lb (108.9 kg)  Objective:   Physical Exam Vitals reviewed.  Constitutional:      Appearance: He is well-developed.  Neck:     Vascular: No JVD.  Cardiovascular:     Rate and Rhythm: Normal rate and regular rhythm.     Pulses: Intact distal pulses.     Heart sounds: Normal heart sounds. No murmur heard. Pulmonary:     Effort: Pulmonary effort is normal.     Breath sounds: Normal breath sounds. No wheezing or rales.  Musculoskeletal:     Right lower leg: No edema.     Left lower leg: No edema.         Assessment & Recommendations:   76 y.o. Caucasian male with hypertension, hyperlipidemia, mod nonobstructive CAD, type 2 DM, OSA on CPAP, hypothyroidism, GERD  CAD: S/p mid LAD PTCA (08/2021). Recommend DAPT with Aspirin and plavix till 04/2022 Continue aspirin, metoprolol, amlodipine. Okay to stop Imdur. Check lipid panel.  Hypertension: Generally well controlled. Monitor for now.  Type 2 diabetes mellitus without complication: Continue current management, including Jardiance.  Mixed hyperlipidemia: Check lipid panel  F/u in 3 months    [employer], PA-C 09/19/2021, 8:14 AM Office: [phone]

## 2021-09-26 ENCOUNTER — Other Ambulatory Visit (HOSPITAL_COMMUNITY): Payer: Self-pay

## 2021-09-26 LAB — LIPID PANEL
Chol/HDL Ratio: 3.9 ratio (ref 0.0–5.0)
Cholesterol, Total: 125 mg/dL (ref 100–199)
HDL: 32 mg/dL — ABNORMAL LOW (ref >39)
LDL Chol Calc (NIH): 60 mg/dL (ref 0–99)
Triglycerides: 200 mg/dL — ABNORMAL HIGH (ref 0–149)
VLDL Cholesterol Cal: 33 mg/dL (ref 5–40)

## 2021-09-26 NOTE — Addendum Note (Signed)
Addended by: Nigel Mormon on: 09/26/2021 04:12 PM   Modules accepted: Orders

## 2021-10-02 ENCOUNTER — Encounter (HOSPITAL_COMMUNITY)
Admission: RE | Admit: 2021-10-02 | Discharge: 2021-10-02 | Disposition: A | Discharge status: Home / Self Care | Payer: HMO | Source: Ambulatory Visit | Attending: Adult Health | Admitting: Adult Health

## 2021-10-02 DIAGNOSIS — G20 Parkinson's disease: Secondary | ICD-10-CM | POA: Insufficient documentation

## 2021-10-02 DIAGNOSIS — G20C Parkinsonism, unspecified: Secondary | ICD-10-CM

## 2021-10-02 MED ORDER — POTASSIUM IODIDE (ANTIDOTE) 130 MG PO TABS
ORAL_TABLET | ORAL | Status: DC
Start: 2021-10-02 — End: 2021-10-02
  Filled 2021-10-02: qty 1

## 2021-10-08 DIAGNOSIS — G20 Parkinson's disease: Secondary | ICD-10-CM | POA: Diagnosis not present

## 2021-10-08 MED ORDER — IOFLUPANE I 123 185 MBQ/2.5ML IV SOLN
4.0000 | Freq: Once | INTRAVENOUS | Status: AC | PRN
  Administered 2021-10-02: 4 via INTRAVENOUS

## 2021-10-10 ENCOUNTER — Telehealth: Payer: Self-pay | Admitting: *Deleted

## 2021-10-10 NOTE — Telephone Encounter (Signed)
-----   Message from Ward Givens, NP sent at 10/09/2021  2:27 PM EDT ----- Datscan did not show activity in the basal ganglia to suggest parkinsons. Was within normal limits. Can discuss with Dr. Brett Fairy more in November.

## 2021-10-11 DIAGNOSIS — M109 Gout, unspecified: Secondary | ICD-10-CM | POA: Diagnosis not present

## 2021-10-11 DIAGNOSIS — E1140 Type 2 diabetes mellitus with diabetic neuropathy, unspecified: Secondary | ICD-10-CM | POA: Diagnosis not present

## 2021-10-11 DIAGNOSIS — I10 Essential (primary) hypertension: Secondary | ICD-10-CM | POA: Diagnosis not present

## 2021-10-11 DIAGNOSIS — R7989 Other specified abnormal findings of blood chemistry: Secondary | ICD-10-CM | POA: Diagnosis not present

## 2021-10-11 DIAGNOSIS — E039 Hypothyroidism, unspecified: Secondary | ICD-10-CM | POA: Diagnosis not present

## 2021-10-11 DIAGNOSIS — E7800 Pure hypercholesterolemia, unspecified: Secondary | ICD-10-CM | POA: Diagnosis not present

## 2021-10-11 DIAGNOSIS — Z125 Encounter for screening for malignant neoplasm of prostate: Secondary | ICD-10-CM | POA: Diagnosis not present

## 2021-10-15 DIAGNOSIS — Z1211 Encounter for screening for malignant neoplasm of colon: Secondary | ICD-10-CM | POA: Diagnosis not present

## 2021-10-16 DIAGNOSIS — E1165 Type 2 diabetes mellitus with hyperglycemia: Secondary | ICD-10-CM | POA: Diagnosis not present

## 2021-10-18 ENCOUNTER — Encounter: Payer: Self-pay | Admitting: Cardiology

## 2021-10-18 ENCOUNTER — Other Ambulatory Visit: Payer: Self-pay | Admitting: Cardiology

## 2021-10-18 DIAGNOSIS — H53139 Sudden visual loss, unspecified eye: Secondary | ICD-10-CM

## 2021-10-18 DIAGNOSIS — E1140 Type 2 diabetes mellitus with diabetic neuropathy, unspecified: Secondary | ICD-10-CM | POA: Diagnosis not present

## 2021-10-18 DIAGNOSIS — R82998 Other abnormal findings in urine: Secondary | ICD-10-CM | POA: Diagnosis not present

## 2021-10-18 NOTE — Progress Notes (Signed)
Received a call from patient's PCP, Dr. Osborne Casco. Patient had an episode of sudden loss of vision in one eye for 10 minutes, concerning for possible cardio embolic event. Will arrange carotid ultrasound and two cardiac telemetry. If unyielding, may need to consider loop recorder.

## 2021-10-19 ENCOUNTER — Ambulatory Visit: Payer: HMO

## 2021-10-19 DIAGNOSIS — G453 Amaurosis fugax: Secondary | ICD-10-CM | POA: Diagnosis not present

## 2021-10-19 DIAGNOSIS — H53139 Sudden visual loss, unspecified eye: Secondary | ICD-10-CM

## 2021-10-19 DIAGNOSIS — I25118 Atherosclerotic heart disease of native coronary artery with other forms of angina pectoris: Secondary | ICD-10-CM | POA: Diagnosis not present

## 2021-10-22 NOTE — Progress Notes (Signed)
No significant stenosis in carotids. Will await monitor results.   Thanks MJP

## 2021-10-25 ENCOUNTER — Telehealth (HOSPITAL_COMMUNITY): Payer: Self-pay

## 2021-10-25 NOTE — Telephone Encounter (Signed)
Called and spoke with pt in regards to CR, pt stated he has a lot of things going on right now and is not able to participate at this time.   Closed referral

## 2021-10-29 ENCOUNTER — Other Ambulatory Visit: Payer: Self-pay | Admitting: Neurology

## 2021-10-30 ENCOUNTER — Telehealth: Payer: Self-pay | Admitting: Adult Health

## 2021-10-30 MED ORDER — CARBIDOPA-LEVODOPA 25-100 MG PO TABS: 1.0000 | ORAL_TABLET | Freq: Three times a day (TID) | ORAL | 5 refills | Status: DC

## 2021-10-30 NOTE — Telephone Encounter (Signed)
The Mychart message from [place], [place] was read to pt and he said yes, he has been back on the medication since he last saw Jinny Blossom, NP

## 2021-10-30 NOTE — Telephone Encounter (Signed)
Looks like he's been off this since approx. June 2023. Does he wish to restart?   Called pt & LVM asking for call back. Also sent mychart message to pt that he can respond to if preferred.

## 2021-10-30 NOTE — Telephone Encounter (Signed)
Pt requesting a refill for [facility] 25-[facility] mg at [id number]

## 2021-10-30 NOTE — Telephone Encounter (Signed)
I have renewed the sinemet 25/'100mg'$  IR tabs taking 1 tablet TID.  #90 with 5 refills.

## 2021-10-30 NOTE — Addendum Note (Signed)
Addended by: Oliver Hum S on: 10/30/2021 01:10 PM   Modules accepted: Orders

## 2021-11-26 ENCOUNTER — Telehealth: Payer: Self-pay

## 2021-11-26 NOTE — Telephone Encounter (Signed)
Spoke with Inez Catalina from [employer] Per Anthony [name] was seen for an oral procedure last week at the [facility] and received Novocaine in both his top and bottom jaw. After the injections the patient experienced tremors that subsided quickly and chest pressure for about 30 min. He is wanting to know if it is correlated to the Novocaine and if it safe to receive the medication again when he returns to complete the procedure. After consulting with Dr. Virgina Jock, he said it probably isn't related to the medication and that he wants to see the patient on Wednesday 11/28/2021. I let Inez Catalina know what Dr. Virgina Jock said and called the patient to get him scheduled. He is scheduled for Wednesday 11/28/2021.

## 2021-11-27 ENCOUNTER — Other Ambulatory Visit: Payer: Self-pay | Admitting: Cardiology

## 2021-11-27 DIAGNOSIS — I25118 Atherosclerotic heart disease of native coronary artery with other forms of angina pectoris: Secondary | ICD-10-CM

## 2021-11-27 NOTE — Progress Notes (Unsigned)
Patient referred by Haywood Pao, MD for chest pain  Subjective:   Anthony [name], male    DOB: 1945-07-24, 76 y.o.   MRN: [id number]   No chief complaint on file.  76 y.o. Caucasian male with hypertension, hyperlipidemia, CAD, type 2 DM, OSA on CPAP, hypothyroidism, GERD  ***   Current Outpatient Medications:    acetaminophen (TYLENOL) 500 MG tablet, Take 500 mg by mouth every 6 (six) hours as needed for moderate pain., Disp: , Rfl:    allopurinol (ZYLOPRIM) 300 MG tablet, Take 300 mg by mouth daily. , Disp: , Rfl:    Alogliptin Benzoate 25 MG TABS, Take 25 mg by mouth daily., Disp: , Rfl:    amLODipine (NORVASC) 2.5 MG tablet, Take 2.5 mg by mouth daily., Disp: , Rfl:    Ascorbic Acid (VITAMIN C) 1000 MG tablet, Take 1,000 mg by mouth daily., Disp: , Rfl:    aspirin 81 MG EC tablet, TAKE 1 TABLET (81 MG TOTAL) BY MOUTH DAILY. SWALLOW WHOLE., Disp: 90 tablet, Rfl: 3   Capsaicin 0.05 % CREA, Apply 1 application  topically at bedtime. Apply to feet, Disp: , Rfl:    carbidopa-levodopa (SINEMET IR) 25-100 MG tablet, Take 1 tablet by mouth 3 (three) times daily., Disp: 90 tablet, Rfl: 5   carboxymethylcellulose (REFRESH PLUS) 0.5 % SOLN, Place 1 drop into both eyes 3 (three) times daily as needed (dry eyes)., Disp: , Rfl:    clopidogrel (PLAVIX) 75 MG tablet, Take 1 tablet (75 mg total) by mouth daily with breakfast., Disp: 30 tablet, Rfl: 1   colchicine 0.6 MG tablet, Take 0.6 mg by mouth daily as needed (for gout)., Disp: , Rfl:    cyclobenzaprine (FLEXERIL) 5 MG tablet, Take 1 tablet (5 mg total) by mouth 2 (two) times daily as needed for muscle spasms., Disp: 10 tablet, Rfl: 0   divalproex (DEPAKOTE) 250 MG DR tablet, Take 250 mg by mouth at bedtime., Disp: , Rfl:    DULoxetine (CYMBALTA) 30 MG capsule, Take 30 mg by mouth daily., Disp: , Rfl:    empagliflozin (JARDIANCE) 10 MG TABS tablet, Take 10 mg by mouth daily. , Disp: , Rfl:    furosemide (LASIX) 40 MG tablet, Take 40  mg by mouth daily., Disp: , Rfl:    gabapentin (NEURONTIN) 100 MG capsule, Take 200 mg by mouth at bedtime. Take with 600 mg tablet, Disp: , Rfl:    gabapentin (NEURONTIN) 600 MG tablet, Take 600 mg by mouth 3 (three) times daily., Disp: , Rfl:    ibuprofen (ADVIL) 200 MG tablet, Take 200-400 mg by mouth every 8 (eight) hours as needed for moderate pain. Three times a week, Disp: , Rfl:    insulin glargine-yfgn (SEMGLEE) 100 UNIT/ML Pen, Inject 50 Units into the skin at bedtime., Disp: , Rfl:    isosorbide mononitrate (IMDUR) 60 MG 24 hr tablet, TAKE 1 TABLET BY MOUTH EVERY DAY, Disp: 90 tablet, Rfl: 3   ketoconazole (NIZORAL) 2 % cream, Apply 1 application topically daily as needed for irritation (yeast irritation). , Disp: , Rfl:    lisinopril (ZESTRIL) 40 MG tablet, Take 40 mg by mouth daily., Disp: , Rfl:    metoprolol succinate (TOPROL-XL) 50 MG 24 hr tablet, TAKE 1 TABLET BY MOUTH EVERY DAY WITH OR IMMEDIATELY FOLLOWING A MEAL, Disp: 90 tablet, Rfl: 3   metroNIDAZOLE (METROGEL) 0.75 % gel, Apply 1 application topically daily as needed (Skin rash on face). , Disp: , Rfl:  mirabegron ER (MYRBETRIQ) 50 MG TB24 tablet, Take 50 mg by mouth daily., Disp: , Rfl:    nitroGLYCERIN (NITROSTAT) 0.4 MG SL tablet, Place 0.4 mg under the tongue every 5 (five) minutes as needed for chest pain., Disp: , Rfl:    OVER THE COUNTER MEDICATION, Apply 1 application  topically daily as needed (Leg cramps). Thera Worx, Disp: , Rfl:    oxybutynin (DITROPAN) 5 MG tablet, Take 5 mg by mouth at bedtime., Disp: , Rfl:    pantoprazole (PROTONIX) 40 MG tablet, Take protonix 40 mg twice daily for 2 months.  Resume once per day after that. (Patient taking differently: Take 40 mg by mouth 2 (two) times daily before a meal.), Disp: 60 tablet, Rfl: 0   polyethylene glycol (MIRALAX / GLYCOLAX) 17 g packet, Take 17 g by mouth daily as needed for mild constipation or moderate constipation., Disp: , Rfl:    pyridOXINE (VITAMIN B-6)  100 MG tablet, Take 100 mg by mouth daily., Disp: , Rfl:    rosuvastatin (CRESTOR) 20 MG tablet, TAKE 1 TABLET BY MOUTH EVERY DAY, Disp: 90 tablet, Rfl: 3   SYNTHROID 25 MCG tablet, Take 25 mcg by mouth daily before breakfast., Disp: , Rfl:    tamsulosin (FLOMAX) 0.4 MG CAPS capsule, Take 0.4 mg by mouth every evening. , Disp: , Rfl:    White Petrolatum-Mineral Oil (REFRESH LACRI-LUBE OP), Place 1 drop into both eyes at bedtime., Disp: , Rfl:   Cardiovascular studies:  EKG 09/19/2021: Sinus rhythm 76 bpm  Normal EKG  Coronary intervention 08/28/2021: LM: Normal LAD: Prox-mid 50% disease (Unchanged compared to cath 2021)         Distal focal 75% stenosis (New since cath 2021) Lcx: Prox-mid 50% disease (Unchanged compared to cath 2021) Ramus: Prox 50% disease (Unchanged compared to cath 2021) RCA: Prox-distal 30% disease (Unchanged compared to cath 2021) Lcx: Prox-mid 50% disease   Successful PTCA distal LAD  w/2.0X12 mm balloon at 6 atm Excellent results, thus stenting deferred.  Echocardiogram 08/10/2021:  Normal LV systolic function with EF 69%. Left ventricle cavity is normal  in size. Moderate concentric hypertrophy of the left ventricle. Normal  global wall motion. Normal diastolic filling pattern. Calculated EF 69%.  Trileaflet aortic valve. Trace aortic stenosis. Trace aortic  regurgitation. Mild aortic valve leaflet calcification. Peak velocity    2.64ms, Peak Pressure Gradient  273mg, Mean Gradient 9.38m738m, AVA  1.51cm, Dimensionless Index 0.5  Structurally normal tricuspid valve with trace regurgitation. No evidence  of pulmonary hypertension.  IVC is dilated with respiratory variation. May suggest mild elevation in  CVP.   Recent labs: 09/25/2021: Chol 125, TG 200, HDL 32, LDL 60  08/20/2021: Glucose 134, BUN/Cr 16/1.11. EGFR 69. Na/K 139/4.8.  H/H 15/46. MCV 88. Platelets 254  11/2020: Chol 145, TG 197, HDL 33, LDL 79  Review of Systems  Cardiovascular:   Negative for chest pain, dyspnea on exertion, leg swelling, palpitations and syncope.         There were no vitals filed for this visit.    There is no height or weight on file to calculate BMI. There were no vitals filed for this visit.    Objective:   Physical Exam Vitals reviewed.  Constitutional:      Appearance: He is well-developed.  Neck:     Vascular: No JVD.  Cardiovascular:     Rate and Rhythm: Normal rate and regular rhythm.     Pulses: Intact distal pulses.  Heart sounds: Normal heart sounds. No murmur heard. Pulmonary:     Effort: Pulmonary effort is normal.     Breath sounds: Normal breath sounds. No wheezing or rales.  Musculoskeletal:     Right lower leg: No edema.     Left lower leg: No edema.         Assessment & Recommendations:   76 y.o. Caucasian male with hypertension, hyperlipidemia, mod nonobstructive CAD, type 2 DM, OSA on CPAP, hypothyroidism, GERD  *** CAD: S/p mid LAD PTCA (08/2021). Recommend DAPT with Aspirin and plavix till 04/2022 Continue aspirin, metoprolol, amlodipine. Okay to stop Imdur. Check lipid panel.  Hypertension: Generally well controlled. Monitor for now.  Type 2 diabetes mellitus without complication: Continue current management, including Jardiance.  Mixed hyperlipidemia: Chol 125, TG 200, HDL 32, LDL 60 (09/2021) on ***  *** F/u in 3 months    [employer], PA-C 11/27/2021, 8:26 PM Office: [phone]

## 2021-11-28 ENCOUNTER — Encounter: Payer: Self-pay | Admitting: Cardiology

## 2021-11-28 ENCOUNTER — Ambulatory Visit: Payer: HMO | Admitting: Cardiology

## 2021-11-28 VITALS — BP 126/71 | HR 70 | Resp 17 | Ht 70.0 in | Wt 249.0 lb

## 2021-11-28 DIAGNOSIS — I25118 Atherosclerotic heart disease of native coronary artery with other forms of angina pectoris: Secondary | ICD-10-CM

## 2021-11-28 DIAGNOSIS — I10 Essential (primary) hypertension: Secondary | ICD-10-CM

## 2021-11-29 ENCOUNTER — Other Ambulatory Visit: Payer: Self-pay

## 2021-11-29 LAB — LIPID PANEL
Chol/HDL Ratio: 4.2 ratio (ref 0.0–5.0)
Cholesterol, Total: 142 mg/dL (ref 100–199)
HDL: 34 mg/dL — ABNORMAL LOW
LDL Chol Calc (NIH): 74 mg/dL (ref 0–99)
Triglycerides: 205 mg/dL — ABNORMAL HIGH (ref 0–149)
VLDL Cholesterol Cal: 34 mg/dL (ref 5–40)

## 2021-11-29 LAB — TROPONIN T: Troponin T (Highly Sensitive): 10 ng/L (ref 0–22)

## 2021-11-29 MED ORDER — ROSUVASTATIN CALCIUM 40 MG PO TABS: 40.0000 mg | ORAL_TABLET | Freq: Every day | ORAL | 3 refills | Status: DC

## 2021-11-29 NOTE — Progress Notes (Signed)
Patient has been made aware.

## 2021-11-29 NOTE — Progress Notes (Signed)
Called and spoke to patient he voiced understanding and medication has been sent

## 2021-11-29 NOTE — Progress Notes (Signed)
LDL and TG remain high. Please increase Crestor to 40 mg daily. Please send a new prescription 90 pillsX3 refills  Thanks MJP

## 2021-12-06 ENCOUNTER — Telehealth: Payer: Self-pay | Admitting: Neurology

## 2021-12-06 ENCOUNTER — Encounter: Payer: Self-pay | Admitting: Neurology

## 2021-12-06 ENCOUNTER — Ambulatory Visit: Payer: HMO | Admitting: Neurology

## 2021-12-06 VITALS — BP 146/77 | HR 63 | Ht 70.0 in | Wt 244.4 lb

## 2021-12-06 DIAGNOSIS — G622 Polyneuropathy due to other toxic agents: Secondary | ICD-10-CM

## 2021-12-06 DIAGNOSIS — G4733 Obstructive sleep apnea (adult) (pediatric): Secondary | ICD-10-CM | POA: Diagnosis not present

## 2021-12-06 DIAGNOSIS — G20C Parkinsonism, unspecified: Secondary | ICD-10-CM | POA: Diagnosis not present

## 2021-12-06 DIAGNOSIS — E113391 Type 2 diabetes mellitus with moderate nonproliferative diabetic retinopathy without macular edema, right eye: Secondary | ICD-10-CM

## 2021-12-06 DIAGNOSIS — Z794 Long term (current) use of insulin: Secondary | ICD-10-CM

## 2021-12-06 NOTE — Telephone Encounter (Signed)
Sent referral for physical therapy through [facility] to [facility].

## 2021-12-06 NOTE — Progress Notes (Signed)
[facility]  Provider:  Dr Patrisia Faeth Referring Provider: Osborne Casco, Fransico Him, MD Primary Care Physician:  Haywood Pao, MD  Chief Complaint  Patient presents with   Follow-up    RM 10, alone. OSA/cpap f/u. Last seen 09/05/21. Tremor medication helping. Started back on this August 2023.  Having trouble with balance intermittently. Left worse than right for balance. This started in the last 6 months.     HPI:  12-06-2021;  Anthony [name] is a 76 y.o. male here seen originally upon referral from Dr. Osborne Casco , NP [facility], for a tremor evaluation. He is an established CPAP patient , was last seen 09-05-2021 by Ward Givens for tremor/ Parkinson's disease related medication management- Has DM related  Neuropathy ( [facility] 2022, Dr Felecia Shelling).   I briefly reviewed his medication list there are several medications used for neuropathy with pain and some of his medications of other sedating which includes somewhat Cymbalta and Neurontin Sinemet is used at 25 over 100 mg 3 times daily, and 30 minutes prior to meals. He had been taken off the continuous metformin and has better digestion, he had noted good effect of sinemet on tremor.  25/ 100 mg tid.  It takes 45 minutes to feel this improvement and it works for about 4 hours .  [name] has been 100% compliant CPAP user by days and hours with an average time of 8 hours and 6 minutes each night.  The auto titration CPAP is used at a setting between 8 and 13 cm water pressure with 2 cm expiratory relief.  The residual AHI is 2.9/h most of the residuals are obstructive.  The 95th percentile pressure is 11.8 cm water.  We should increase his CPAP pressure by 1 or 2 cm today.  His air leakage is moderate to high at the 95th percentile 23 L a minute.      11-07-2020: He has been followed here since 2016 or 2015 before and was actually followed for obstructive sleep apnea and CPAP use.  Today he presents for a tremor  evaluation.  Nurse practitioner Earle Gell mentioned that the patient has noted a slowly progressive tremor that affected his left dominant hand greater than his right this has been present since earlier this year maybe even a little longer but it has progressed since.   The patient also has a history of neuropathy, he does not use socks and has had swelling and redness of the skin in the lower extremities sometimes numbness in the left upper extremity as well.  He has remained compliant with his CPAP use for which she has followed up with our nurse practitioner.  However as of early October this year his tremor has become so high amplitude that the shaking of his left side has left him unable to perform certain maneuvers, affects his fine motor skills.  He also seems to run into things and bumps into objects when he walks.  He drops objects to from his dominant hand.  The patient has a history of diabetes mellitus type 2 diagnosed in 2007, hyperglycemia, basal insulin was started 2019, he has a history of hypertension angina pectoris and has followed with Dr. Virgina Jock his cardiologist.  Hyperlipidemia hypothyroidism noted in 2008 history of gout and benign prostate hyperplasia, pancreatitis bout in March 2016.  COVID in July 2022. Eye vision loss, 2020, right eye  DM related or stroke. At [facility].    Review of Systems: Out of a complete  14 system review, the patient complains of only the following symptoms, and all other reviewed systems are negative.  How likely are you to doze in the following situations: 0 = not likely, 1 = slight chance, 2 = moderate chance, 3 = high chance  Sitting and Reading? Watching Television? Sitting inactive in a public place (theater or meeting)? Lying down in the afternoon when circumstances permit? Sitting and talking to someone? Sitting quietly after lunch without alcohol? In a car, while stopped for a few minutes in traffic? As a passenger in a car for an hour  without a break?  Total =  15/ 24 points HIGH while CPAP compliant.  PTSD flash backs at night.  FSS endorsed at 33/ 63 points.   Social History   Socioeconomic History   Marital status: Married    Spouse name: [name]   Number of children: 2   Years of education: 12   Highest education level: Not on file  Occupational History   Occupation: [employer]    Employer: [employer]  Tobacco Use   Smoking status: Never   Smokeless tobacco: Never  Vaping Use   Vaping Use: Never used  Substance and Sexual Activity   Alcohol use: No   Drug use: No   Sexual activity: Not on file  Other Topics Concern   Not on file  Social History Narrative   Patient is married [name]) and lives at home with his wife.   Patient has two children (twins).   Patient is retired.   Patient has a high school education.   Patient does not drink any caffeine.   Patient is left-handed.   Social Determinants of Health   Financial Resource Strain: Not on file  Food Insecurity: No Food Insecurity (03/18/2019)   Hunger Vital Sign    Worried About Running Out of Food in the Last Year: Never true    Ran Out of Food in the Last Year: Never true  Transportation Needs: No Transportation Needs (03/18/2019)   PRAPARE - Hydrologist (Medical): No    Lack of Transportation (Non-Medical): No  Physical Activity: Not on file  Stress: Not on file  Social Connections: Not on file  Intimate Partner Violence: Not on file    Family History  Problem Relation Age of Onset   Heart disease Mother    Diabetes Mother    Neuropathy Mother    Colon cancer Father    Diabetes Sister    Lung cancer Brother    Sleep apnea Brother    Colon cancer Brother 7   Diabetes Maternal Grandmother    Heart attack Maternal Grandmother    Colon cancer Maternal Grandfather    Heart Problems Maternal Grandfather    Prostate cancer Maternal Grandfather    Cancer Maternal Grandfather        right eye    Heart attack Maternal Grandfather    Stomach cancer Neg Hx    Ulcerative colitis Neg Hx    Esophageal cancer Neg Hx    Parkinson's disease Neg Hx     Past Medical History:  Diagnosis Date   BPH (benign prostatic hyperplasia)    Diabetes mellitus type 2, controlled ([place])    Fatty liver    GERD (gastroesophageal reflux disease)    Gout    last flare up last week right ankle    Heart disease    HTN (hypertension)    Hyperlipidemia    Hypothyroidism    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   Obesity (BMI 30.0-34.9) 05/14/2013   OSA on CPAP    cpap setting of 10/ 13   Pancreatitis dx march 2016   Pneumonia 12 years ago    Past Surgical History:  Procedure Laterality Date   BACK SURGERY  10/2009   Cervical, arterior   CARPAL TUNNEL RELEASE Left 2003   CARPAL TUNNEL RELEASE Bilateral    CATARACT EXTRACTION Bilateral 01/2012   CORONARY BALLOON ANGIOPLASTY N/A 08/28/2021   Procedure: CORONARY BALLOON ANGIOPLASTY;  Surgeon: Nigel Mormon, MD;  Location: [facility];  Service: Cardiovascular;  Laterality: N/A;   EUS N/A 07/15/2014   Procedure: FULL UPPER ENDOSCOPIC ULTRASOUND (EUS) RADIAL;  Surgeon: Carol Ada, MD;  Location: [facility];  Service: Endoscopy;  Laterality: N/A;   LEFT HEART CATH AND CORONARY ANGIOGRAPHY N/A 05/18/2019   Procedure: LEFT HEART CATH AND CORONARY ANGIOGRAPHY;  Surgeon: Adrian Prows, MD;  Location: [facility];  Service: Cardiovascular;  Laterality: N/A;   LEFT HEART CATH AND CORONARY ANGIOGRAPHY N/A 08/28/2021   Procedure: LEFT HEART CATH AND CORONARY ANGIOGRAPHY;  Surgeon: Nigel Mormon, MD;  Location: [facility];  Service: Cardiovascular;  Laterality: N/A;   NASAL SINUS SURGERY  1981   RETINAL Bilateral 06/2013   Retinal peel   SHOULDER SURGERY Right 2003    Current Outpatient Medications  Medication Sig Dispense Refill   acetaminophen (TYLENOL) 500 MG tablet Take 500 mg by mouth every 6 (six) hours as needed for moderate  pain.     allopurinol (ZYLOPRIM) 300 MG tablet Take 300 mg by mouth daily.      Alogliptin Benzoate 25 MG TABS Take 25 mg by mouth daily.     amLODipine (NORVASC) 2.5 MG tablet Take 2.5 mg by mouth daily.     Ascorbic Acid (VITAMIN C) 1000 MG tablet Take 1,000 mg by mouth daily.     aspirin 81 MG EC tablet TAKE 1 TABLET (81 MG TOTAL) BY MOUTH DAILY. SWALLOW WHOLE. 90 tablet 3   Capsaicin 0.05 % CREA Apply 1 application  topically at bedtime. Apply to feet     carbidopa-levodopa (SINEMET IR) 25-100 MG tablet Take 1 tablet by mouth 3 (three) times daily. 90 tablet 5   carboxymethylcellulose (REFRESH PLUS) 0.5 % SOLN Place 1 drop into both eyes 3 (three) times daily as needed (dry eyes).     clopidogrel (PLAVIX) 75 MG tablet Take 1 tablet (75 mg total) by mouth daily with breakfast. 30 tablet 1   colchicine 0.6 MG tablet Take 0.6 mg by mouth daily as needed (for gout).     cyclobenzaprine (FLEXERIL) 5 MG tablet Take 1 tablet (5 mg total) by mouth 2 (two) times daily as needed for muscle spasms. 10 tablet 0   divalproex (DEPAKOTE) 250 MG DR tablet Take 250 mg by mouth at bedtime.     DULoxetine (CYMBALTA) 30 MG capsule Take 30 mg by mouth daily.     empagliflozin (JARDIANCE) 10 MG TABS tablet Take 10 mg by mouth daily.      furosemide (LASIX) 40 MG tablet Take 40 mg by mouth daily.     gabapentin (NEURONTIN) 100 MG capsule Take 200 mg by mouth at bedtime. Take with 600 mg tablet     gabapentin (NEURONTIN) 600 MG tablet Take 600 mg by mouth 3 (three) times daily.     insulin glargine-yfgn (SEMGLEE) 100 UNIT/ML Pen Inject 50 Units into the skin at bedtime.     isosorbide  mononitrate (IMDUR) 60 MG 24 hr tablet TAKE 1 TABLET BY MOUTH EVERY DAY 90 tablet 3   ketoconazole (NIZORAL) 2 % cream Apply 1 application topically daily as needed for irritation (yeast irritation).      lisinopril (ZESTRIL) 40 MG tablet Take 40 mg by mouth daily.     metoprolol succinate (TOPROL-XL) 50 MG 24 hr tablet TAKE 1 TABLET  BY MOUTH EVERY DAY WITH OR IMMEDIATELY FOLLOWING A MEAL 90 tablet 3   metroNIDAZOLE (METROGEL) 0.75 % gel Apply 1 application topically daily as needed (Skin rash on face).      mirabegron ER (MYRBETRIQ) 50 MG TB24 tablet Take 50 mg by mouth daily.     nitroGLYCERIN (NITROSTAT) 0.4 MG SL tablet Place 0.4 mg under the tongue every 5 (five) minutes as needed for chest pain.     OVER THE COUNTER MEDICATION Apply 1 application  topically daily as needed (Leg cramps). Thera Worx     oxybutynin (DITROPAN) 5 MG tablet Take 5 mg by mouth at bedtime.     pantoprazole (PROTONIX) 40 MG tablet Take protonix 40 mg twice daily for 2 months.  Resume once per day after that. (Patient taking differently: Take 40 mg by mouth 2 (two) times daily before a meal.) 60 tablet 0   polyethylene glycol (MIRALAX / GLYCOLAX) 17 g packet Take 17 g by mouth daily as needed for mild constipation or moderate constipation.     pyridOXINE (VITAMIN B-6) 100 MG tablet Take 100 mg by mouth daily.     rosuvastatin (CRESTOR) 40 MG tablet Take 1 tablet (40 mg total) by mouth daily. 90 tablet 3   SYNTHROID 25 MCG tablet Take 25 mcg by mouth daily before breakfast.     tamsulosin (FLOMAX) 0.4 MG CAPS capsule Take 0.4 mg by mouth every evening.      White Petrolatum-Mineral Oil (REFRESH LACRI-LUBE OP) Place 1 drop into both eyes at bedtime.     No current facility-administered medications for this visit.    Allergies as of 12/06/2021 - Review Complete 12/06/2021  Allergen Reaction Noted   Carbidopa-levodopa Other (See Comments) 06/28/2014   Lyrica [pregabalin]  08/23/2019   Metformin Diarrhea 02/19/2016    Vitals: BP (!) 146/77 (BP Location: Left Arm, Patient Position: Sitting, Cuff Size: Normal)   Pulse 63   Ht '5\' 10"'$  (1.778 m)   Wt 244 lb 6.4 oz (110.9 kg)   BMI 35.07 kg/m  Last Weight:  Wt Readings from Last 1 Encounters:  12/06/21 244 lb 6.4 oz (110.9 kg)   Last Height:   Ht Readings from Last 1 Encounters:  12/06/21  '5\' 10"'$  (1.778 m)   Vision Screening:  Left eye with correction 20/ 70 with clouding of the right outer field.    Right eye with correction 20/400.  Everything is wavy, like a film .   Physical exam:  General: The patient is awake, alert and appears not in acute distress. The patient is well groomed. Head: Normocephalic, atraumatic. Neck is supple. Mallampati 3, neck circumference:21 Cardiovascular:  Regular rate and rhyth, without  murmurs or carotid bruit, and without distended neck veins. Respiratory: Lungs are clear to auscultation. Skin:  Without evidence of edema, or rash Trunk: BMI is 33. 6. elevated and patient  has normal posture.   Neurologic exam : The patient is awake and alert, oriented to place and time.  Memory subjective  described as intact. There is a normal attention span & concentration ability. Speech is fluent without  dysarthria, mild dysphonia . Wife reports mild aphasia. Mood and affect are appropriate.  Cranial nerves: Pupils are equal and briskly reactive to light. Extraocular movements  in vertical and horizontal planes intact and without nystagmus. Hearing to finger rub impaired   Facial sensation intact to fine touch.  Facial motor strength is symmetric and tongue and uvula move midline.  Motor exam:   Normal tone and normal muscle bulk and symmetric normal strength in all extremities.  Sensory:  Fine touch, pinprick and vibration were decreased in all extremities. Proprioception is tested in the upper extremities only. This was normal.  Coordination: Rapid alternating movements in the fingers/hands is left side slowed, and abnormal.  Finger-to-nose maneuver tested : slowed with evidence of ataxia, dysmetria and tremor on the left over right .  Left sided pill rolling tremor, large amplitude.  See attached tremogram.   Gait and station: Patient walks without assistive device , bare feet, and is able to provide a  normal Stance is . Tandem gait is  impaired. He is walking slightly stooped, turning with 4 steps, unsafe turning. He has reduced arm swing in both arms, but more left, and his pill rolling tremor continued. , turns with 4 Steps are fragmented. Romberg testing is abnormal.  Deep tendon reflexes: in the  upper extremities are symmetric and intact. Lower extremities - not even a trace of DTR.  Babinski maneuver response is equivocal. .   Assessment:  After physical and neurologic examination, review of laboratory studies, imaging, neurophysiology testing and pre-existing records, assessment :   1) Left dominant Tremor  with fine amplitude, but also sudden jerking movements, Left over right. had medication 3 hours ago,  affecting dominant side.   Affecting  gait. off balance - Not falling but running into things- has significant vision impairment and visual field cut, unsafe to drive.  Has been since at least 18 months.   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   3) Persistent daytime Sleepiness/ hypersomnia:  OSA, followed here. He is highly compliant with CPAP but has a relatively high air leak and he is still endorsing an Epworth score of 15/ 24 points- Very high.    Plan:  Treatment plan and additional workup :   PD / Gait disorder : important also because his agent orange exposure can be related to this, neuropathy in  diabetes,  and likely worsening  as is his Parkinson -Disorder. I will continue current medication, 25/ 100 TID before meals. I also asked him to attend Physical gait therapy- ordered here today.   CPAP - was given a new machine by DME, replacement without baseline check. 100% compliant but high air leakage could be improved-  but it is not leading to an excessive residual apnea hypopnea index.  The patient is currently using a nasal pillow, dreamwear tubing on top of the head.  I will increase the upper setting of pressure to 15 cm water.      Rv in 6-8 months  with NP .

## 2021-12-06 NOTE — Patient Instructions (Signed)
Assessment:  After physical and neurologic examination, review of laboratory studies, imaging, neurophysiology testing and pre-existing records, assessment :   1) Left dominant Tremor  with fine amplitude, but also sudden jerking movements, Left over right.  He had taken medication 3 hours ago,  noted decreased in tremor mostly in the left, his  dominant side.   Affecting  gait. off balance - Not falling but running into things- has significant vision impairment and visual field cut, unsafe to drive.  Has been since at least 18 months.   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   3) Persistent daytime Sleepiness/ hypersomnia:  OSA, followed here. He is highly compliant with CPAP but has a relatively high air leak and he is still endorsing an Epworth score of 15/ 24 points- Very high.    Plan:  Treatment plan and additional workup :   PD / Gait disorder : important also because his agent orange exposure can be related to this, neuropathy in  diabetes,  and likely worsening  as is his Parkinson -Disorder. I will continue current medication, 25/ 100 TID before meals. I also asked him to attend Physical gait therapy- ordered here today.   CPAP - was given a new machine by DME, replacement without baseline check. 100% compliant but high air leakage could be improved-  but it is not leading to an excessive residual apnea hypopnea index.  The patient is currently using a nasal pillow, dreamwear tubing on top of the head.  I will increase the upper setting of pressure to 15 cm water.      Rv in 6-8 months with NP .

## 2021-12-10 ENCOUNTER — Telehealth: Payer: Self-pay

## 2021-12-20 ENCOUNTER — Encounter: Payer: Self-pay | Admitting: Physical Therapy

## 2021-12-20 ENCOUNTER — Other Ambulatory Visit: Payer: Self-pay

## 2021-12-20 ENCOUNTER — Ambulatory Visit: Payer: PPO | Attending: Neurology | Admitting: Physical Therapy

## 2021-12-20 DIAGNOSIS — G4733 Obstructive sleep apnea (adult) (pediatric): Secondary | ICD-10-CM | POA: Diagnosis not present

## 2021-12-20 DIAGNOSIS — R2689 Other abnormalities of gait and mobility: Secondary | ICD-10-CM | POA: Diagnosis present

## 2021-12-20 DIAGNOSIS — G622 Polyneuropathy due to other toxic agents: Secondary | ICD-10-CM | POA: Insufficient documentation

## 2021-12-20 DIAGNOSIS — Z794 Long term (current) use of insulin: Secondary | ICD-10-CM | POA: Diagnosis not present

## 2021-12-20 DIAGNOSIS — E113391 Type 2 diabetes mellitus with moderate nonproliferative diabetic retinopathy without macular edema, right eye: Secondary | ICD-10-CM | POA: Diagnosis not present

## 2021-12-20 DIAGNOSIS — G20C Parkinsonism, unspecified: Secondary | ICD-10-CM | POA: Insufficient documentation

## 2021-12-20 DIAGNOSIS — R2681 Unsteadiness on feet: Secondary | ICD-10-CM | POA: Insufficient documentation

## 2021-12-20 DIAGNOSIS — M6281 Muscle weakness (generalized): Secondary | ICD-10-CM | POA: Diagnosis present

## 2021-12-20 NOTE — Therapy (Signed)
OUTPATIENT PHYSICAL THERAPY NEURO EVALUATION   Patient Name: Anthony [name] MRN: [id number] DOB:1945-05-16, 76 y.o., male Today's Date: 12/20/2021   PCP: Domenick Gong MD REFERRING PROVIDER: Larey Seat, MD  END OF SESSION:  PT End of Session - 12/20/21 1403     Visit Number 1    Number of Visits 17    Date for PT Re-Evaluation 02/15/22    Authorization Type [employer]    PT Start Time 1400    PT Stop Time 1440    PT Time Calculation (min) 40 min    Equipment Utilized During Treatment Gait belt    Activity Tolerance Patient tolerated treatment well    Behavior During Therapy WFL for tasks assessed/performed             Past Medical History:  Diagnosis Date   BPH (benign prostatic hyperplasia)    Diabetes mellitus type 2, controlled ([place])    Fatty liver    GERD (gastroesophageal reflux disease)    Gout    last flare up last week right ankle    Heart disease    HTN (hypertension)    Hyperlipidemia    Hypothyroidism    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   Obesity (BMI 30.0-34.9) 05/14/2013   OSA on CPAP    cpap setting of 10/ 13   Pancreatitis dx march 2016   Pneumonia 12 years ago   Past Surgical History:  Procedure Laterality Date   BACK SURGERY  10/2009   Cervical, arterior   CARPAL TUNNEL RELEASE Left 2003   CARPAL TUNNEL RELEASE Bilateral    CATARACT EXTRACTION Bilateral 01/2012   CORONARY BALLOON ANGIOPLASTY N/A 08/28/2021   Procedure: CORONARY BALLOON ANGIOPLASTY;  Surgeon: Nigel Mormon, MD;  Location: [facility];  Service: Cardiovascular;  Laterality: N/A;   EUS N/A 07/15/2014   Procedure: FULL UPPER ENDOSCOPIC ULTRASOUND (EUS) RADIAL;  Surgeon: Carol Ada, MD;  [facility];  Service: Endoscopy;  Laterality: N/A;   LEFT HEART CATH AND CORONARY ANGIOGRAPHY N/A 05/18/2019   Procedure: LEFT HEART CATH AND CORONARY ANGIOGRAPHY;  Surgeon: Adrian Prows, MD;  Location: [facility];  Service: Cardiovascular;   Laterality: N/A;   LEFT HEART CATH AND CORONARY ANGIOGRAPHY N/A 08/28/2021   Procedure: LEFT HEART CATH AND CORONARY ANGIOGRAPHY;  Surgeon: Nigel Mormon, MD;  Location: [facility];  Service: Cardiovascular;  Laterality: N/A;   NASAL SINUS SURGERY  1981   RETINAL Bilateral 06/2013   Retinal peel   SHOULDER SURGERY Right 2003   Patient Active Problem List   Diagnosis Date Noted   Parkinsonism 12/06/2021   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[place]) 12/06/2021   Hyperkalemia 12/06/2020   Coronary artery disease of native artery of native heart with stable angina pectoris ([place]) 12/03/2019   Esophageal dysphagia 11/21/2019   Gastroesophageal reflux disease 11/21/2019   Family history of colon cancer 11/21/2019   Abnormal stress test 05/11/2019   Mixed hyperlipidemia 05/11/2019   Angina pectoris ([place]) 08/07/2018   Essential hypertension 08/07/2018   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, with long-term current use of insulin ([place]) 08/07/2018   Morbid obesity ([place]) 08/28/2017   Neuropathy 08/28/2017   Numbness and tingling of both legs below knees 11/10/2013   OSA on CPAP 05/14/2013   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   Obesity (BMI 30.0-34.9) 05/14/2013    ONSET DATE: 12/06/2021  REFERRING DIAG:  G47.33 (ICD-10-CM) - OSA on CPAP  G20.C (ICD-10-CM) -  Parkinsonism, unspecified Parkinsonism type  E11.3391,Z79.4 (ICD-10-CM) - 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, with long-term current use of insulin, macular edema presence unspecified (HCC)  G62.2 (ICD-10-CM) - 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  Having trouble with my balance-early in the morning and late in afternoon.  Haven't fallen, but do bump into things.  Balance has worsened in the past 3-4 months. Pt accompanied by: self  PERTINENT HISTORY: The patient has a history of diabetes mellitus type 2 diagnosed in 2007, hyperglycemia, basal insulin was started 2019, he has a history of hypertension angina pectoris and has followed with Dr. Virgina Jock his cardiologist.  Hyperlipidemia hypothyroidism noted in 2008 history of gout and benign prostate hyperplasia, pancreatitis bout in March 2016.  COVID in July 2022. Eye vision loss, 2020, right eye  DM related or stroke.     PAIN:  Are you having pain? Yes: NPRS scale: varies/10 Pain location: feet Pain description: burning and tingling Aggravating factors: worse as the day goes on, worse at night Relieving factors: medication, cream  PRECAUTIONS: Fall  WEIGHT BEARING RESTRICTIONS: No  FALLS: Has patient fallen in last 6 months? No  LIVING ENVIRONMENT: Lives with: lives with their spouse Lives in: House/apartment Stairs: Yes: External: 2-4 steps; on right going up Has following equipment at home:  none  PLOF: Independent  PATIENT GOALS: Pt's goals for therapy are to improve balance and strength  OBJECTIVE:   DIAGNOSTIC FINDINGS: DaT Scan 09/2021: Ioflupane scan favored within normal limits. No reduced radiotracer activity in basal ganglia to suggest Parkinson's syndrome pathology.  COGNITION: Overall cognitive status: Within functional limits for tasks assessed   SENSATION: Light touch: Impaired Did not feel light tough at feet bilaterally  LOWER EXTREMITY ROM:   WFL BLEs And MMT  MMT Right Eval Left Eval  Hip flexion 4+ 4  Hip extension    Hip  abduction 4 4  Hip adduction 4 4  Hip internal rotation    Hip external rotation    Knee flexion 4+ 4  Knee extension 4+ 4  Ankle dorsiflexion 5 3+  Ankle plantarflexion    Ankle inversion    Ankle eversion     (Blank rows = not tested)   With PROM, BLEs, increased tone, mild resistance to PROM  TRANSFERS: Assistive device utilized: None  Sit to stand: SBA Stand to sit: SBA GAIT: Gait pattern: step through pattern, decreased stride length, and narrow BOS Distance walked: 50 ft x 4 Assistive device utilized:  trial of single walking pole, SPC and None Level of assistance: SBA Comments: Unsteadiness noted with turns  FUNCTIONAL TESTS:  5 times sit to stand: 19.34 sec Timed up and go (TUG): 14.62 sec TUG cognitive: 19 sec (1 LOB during turn) MiniBESTest: 8/28 (Scores <22/28 indicate increased fall risk) Gait velocity:  16.63 sec:  1.97 ft/sec   [facility] PT Assessment - 12/20/21 0001       Standardized Balance Assessment   Standardized Balance Assessment Mini-BESTest      Mini-BESTest   Sit To Stand Normal: Comes to stand without use of hands and stabilizes independently.    Rise to Toes < 3 s.    Stand on one leg (left) Severe: Unable    Stand on one leg (right) Severe: Unable    Stand on one leg - lowest score 0    Compensatory Stepping Correction - Forward No step, OR would fall if not caught, OR falls spontaneously.    Compensatory Stepping Correction - Backward No step, OR would fall if not caught, OR falls spontaneously.    Compensatory Stepping Correction - Left Lateral Severe: Falls, or cannot step    Compensatory Stepping Correction - Right Lateral Severe:  Falls, or cannot step    Stepping Corredtion Lateral - lowest score 0    Stance - Feet together, eyes open, firm surface  Normal: 30s    Stance - Feet together, eyes closed, foam surface  Severe: Unable    Incline - Eyes Closed Severe: Unable    Change in Gait Speed Moderate: Unable to change walking speed or  signs of imbalance    Walk with head turns - Horizontal Moderate: performs head turns with reduction in gait speed.    Walk with pivot turns Severe: Cannot turn with feet close at any speed without imbalance.    Step over obstacles Moderate: Steps over box but touches box OR displays cautious behavior by slowing gait.    Timed UP & GO with Dual Task Moderate: Dual Task affects either counting OR walking (>10%) when compared to the TUG without Dual Task.    Mini-BEST total score 8             TODAY'S TREATMENT:                                                                                                                              DATE: 12/20/2021    PATIENT EDUCATION: Education details: PT eval results, POC, intiated HEP Person educated: Patient Education method: Explanation, Demonstration, and Handouts Education comprehension:  verbalized understanding, returned demonstration, and needs further education  HOME EXERCISE PROGRAM: Access Code: [email or web address] URL: [email or web address] Date: 12/20/2021 Prepared by: [facility]  Exercises - Heel Toe Raises with Counter Support  - 1 x daily - 7 x weekly - 2 sets - 10 reps - Side to side weightshift  - 1 x daily - 7 x weekly - 2 sets - 10 reps  GOALS: Goals reviewed with patient? Yes  SHORT TERM GOALS: Target date: 01/18/2022  Pt will be independent with HEP for improved balance, strength, gait. Baseline: Goal status: INITIAL  2.  Pt will improve 5x sit<>stand to less than or equal to 15 sec to demonstrate improved functional strength and transfer efficiency. Baseline: 19.34 sec Goal status: INITIAL  3.  Pt will improve TUG score to less than or equal to 13.5 sec for decreased fall risk. Baseline:  Goal status: INITIAL  4.  Pt will verbalize understanding of local Parkinson's disease related resources. Baseline:  Goal status: INITIAL   LONG TERM GOALS: Target date:  02/15/2022  Pt will be independent with HEP for improved balance, gait, strength. Baseline:  Goal status: INITIAL  2.  Pt will improve 5x sit<>stand to less than or equal to 13 sec to demonstrate improved functional strength and transfer efficiency.  Baseline: 19.34 sec Goal status: INITIAL  3.  Pt will improve TUG/TUG cognitive score to less than or equal to 10% difference sec for decreased fall risk.  Baseline: 1462, 19 sec Goal status: INITIAL  4.  Pt will improve MiniBESTest score to at least 14/28 to decrease fall risk. Baseline: 8/28 Goal status: INITIAL  5.  Pt will improve gait velocity to at least 2.3 ft/sec for improved gait efficiency and safety.  Baseline: 1.97 ft/sec Goal status: INITIAL   ASSESSMENT:  CLINICAL IMPRESSION: Patient is a 76 y.o. male who was seen today for physical therapy evaluation and treatment for Parkinson's disease and peripheral neuropathy.  He presents to OPPT eval today with reports of worsening balance over the past 3-4 months, with noted LUE and LLE weakness and tremors.  He presents with decreased balance, decreased strength, decreased timing and coordination of gait, decreased vestibular system use for balance, postural instability.  He is at increased risk of falls per FTSTS, TUG, MiniBESTest, and gait velocity scores.  He will benefit from skilled PT to address the above stated deficits to improve overall functional mobility and decrease fall risk.   OBJECTIVE IMPAIRMENTS: Abnormal gait, decreased balance, decreased knowledge of use of DME, decreased mobility, difficulty walking, decreased strength, and impaired tone.   ACTIVITY LIMITATIONS: standing, squatting, transfers, dressing, locomotion level, and caring for others  PARTICIPATION LIMITATIONS: cleaning, laundry, and community activity  PERSONAL FACTORS: 3+ comorbidities: See above PMH  are also affecting patient's functional outcome.   REHAB POTENTIAL: Good  CLINICAL DECISION  MAKING: Evolving/moderate complexity  EVALUATION COMPLEXITY: Moderate  PLAN:  PT FREQUENCY: 2x/week  PT DURATION: 8 weeks plus eval visit (9 weeks total POC)  PLANNED INTERVENTIONS: Therapeutic exercises, Therapeutic activity, Neuromuscular re-education, Balance training, Gait training, Patient/Family education, Self Care, Stair training, and DME instructions  PLAN FOR NEXT SESSION: Review HEP initiated this visit; work on ankle, hip, step strategies for balance; gait training with cane versus walker.     Frazier Butt., PT 12/20/2021, 4:02 PM  [facility] [address] [place], [place], [place] [address] Phone # [phone] Fax # [phone]

## 2021-12-24 ENCOUNTER — Ambulatory Visit: Payer: PPO | Admitting: Physical Therapy

## 2021-12-24 ENCOUNTER — Ambulatory Visit: Payer: HMO

## 2021-12-24 DIAGNOSIS — I25118 Atherosclerotic heart disease of native coronary artery with other forms of angina pectoris: Secondary | ICD-10-CM

## 2021-12-24 NOTE — Therapy (Incomplete)
OUTPATIENT PHYSICAL THERAPY NEURO TREATMENT NOTES   Patient Name: Anthony [name] MRN: [id number] DOB:04-07-1945, 76 y.o., male Today's Date: 12/24/2021   PCP: Domenick Gong MD REFERRING PROVIDER: Larey Seat, MD  END OF SESSION:    Past Medical History:  Diagnosis Date   BPH (benign prostatic hyperplasia)    Diabetes mellitus type 2, controlled ([place])    Fatty liver    GERD (gastroesophageal reflux disease)    Gout    last flare up last week right ankle    Heart disease    HTN (hypertension)    Hyperlipidemia    Hypothyroidism    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   Obesity (BMI 30.0-34.9) 05/14/2013   OSA on CPAP    cpap setting of 10/ 13   Pancreatitis dx march 2016   Pneumonia 12 years ago   Past Surgical History:  Procedure Laterality Date   BACK SURGERY  10/2009   Cervical, arterior   CARPAL TUNNEL RELEASE Left 2003   CARPAL TUNNEL RELEASE Bilateral    CATARACT EXTRACTION Bilateral 01/2012   CORONARY BALLOON ANGIOPLASTY N/A 08/28/2021   Procedure: CORONARY BALLOON ANGIOPLASTY;  Surgeon: Nigel Mormon, MD;  Location: [facility];  Service: Cardiovascular;  Laterality: N/A;   EUS N/A 07/15/2014   Procedure: FULL UPPER ENDOSCOPIC ULTRASOUND (EUS) RADIAL;  Surgeon: Carol Ada, MD;  [facility];  Service: Endoscopy;  Laterality: N/A;   LEFT HEART CATH AND CORONARY ANGIOGRAPHY N/A 05/18/2019   Procedure: LEFT HEART CATH AND CORONARY ANGIOGRAPHY;  Surgeon: Adrian Prows, MD;  Location: [facility];  Service: Cardiovascular;  Laterality: N/A;   LEFT HEART CATH AND CORONARY ANGIOGRAPHY N/A 08/28/2021   Procedure: LEFT HEART CATH AND CORONARY ANGIOGRAPHY;  Surgeon: Nigel Mormon, MD;  Location: [facility];  Service: Cardiovascular;  Laterality: N/A;   NASAL SINUS SURGERY  1981   RETINAL Bilateral 06/2013   Retinal peel   SHOULDER SURGERY Right 2003   Patient Active Problem List   Diagnosis Date Noted   Parkinsonism  12/06/2021   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[place]) 12/06/2021   Hyperkalemia 12/06/2020   Coronary artery disease of native artery of native heart with stable angina pectoris ([place]) 12/03/2019   Esophageal dysphagia 11/21/2019   Gastroesophageal reflux disease 11/21/2019   Family history of colon cancer 11/21/2019   Abnormal stress test 05/11/2019   Mixed hyperlipidemia 05/11/2019   Angina pectoris ([place]) 08/07/2018   Essential hypertension 08/07/2018   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, with long-term current use of insulin ([place]) 08/07/2018   Morbid obesity ([place]) 08/28/2017   Neuropathy 08/28/2017   Numbness and tingling of both legs below knees 11/10/2013   OSA on CPAP 05/14/2013   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   Obesity (BMI 30.0-34.9) 05/14/2013    ONSET DATE: 12/06/2021  REFERRING DIAG:  G47.33 (ICD-10-CM) - OSA on CPAP  G20.C (ICD-10-CM) - Parkinsonism, unspecified Parkinsonism type  E11.3391,Z79.4 (ICD-10-CM) - 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  Rationale for Evaluation and Treatment: Rehabilitation  SUBJECTIVE:  SUBJECTIVE STATEMENT: ***Was in [place] and was exposed to [employer]; have neuropathy and doctor thinks early stages of Parkinson's.  Having trouble with my balance-early in the morning and late in afternoon.  Haven't fallen, but do bump into things.  Balance has worsened in the past 3-4 months. Pt accompanied by: self  PERTINENT HISTORY: The patient has a history of diabetes mellitus type 2  diagnosed in 2007, hyperglycemia, basal insulin was started 2019, he has a history of hypertension angina pectoris and has followed with Dr. Virgina Jock his cardiologist.  Hyperlipidemia hypothyroidism noted in 2008 history of gout and benign prostate hyperplasia, pancreatitis bout in March 2016.  COVID in July 2022. Eye vision loss, 2020, right eye  DM related or stroke.     PAIN:  Are you having pain? Yes: NPRS scale: varies/10 Pain location: feet Pain description: burning and tingling Aggravating factors: worse as the day goes on, worse at night Relieving factors: medication, cream  PRECAUTIONS: Fall  WEIGHT BEARING RESTRICTIONS: No  FALLS: Has patient fallen in last 6 months? No  LIVING ENVIRONMENT: Lives with: lives with their spouse Lives in: House/apartment Stairs: Yes: External: 2-4 steps; on right going up Has following equipment at home:  none  PLOF: Independent  PATIENT GOALS: Pt's goals for therapy are to improve balance and strength  OBJECTIVE:    TODAY'S TREATMENT: 12/24/2021 Activity Comments                      --------------------------------------------------------------------------------- Objective measures below taken at initial evaluation: DIAGNOSTIC FINDINGS: DaT Scan 09/2021: Ioflupane scan favored within normal limits. No reduced radiotracer activity in basal ganglia to suggest Parkinson's syndrome pathology.  COGNITION: Overall cognitive status: Within functional limits for tasks assessed   SENSATION: Light touch: Impaired Did not feel light tough at feet bilaterally  LOWER EXTREMITY ROM:   WFL BLEs And MMT  MMT Right Eval Left Eval  Hip flexion 4+ 4  Hip extension    Hip abduction 4 4  Hip adduction 4 4  Hip internal rotation    Hip external rotation    Knee flexion 4+ 4  Knee extension 4+ 4  Ankle dorsiflexion 5 3+  Ankle plantarflexion    Ankle inversion    Ankle eversion     (Blank rows = not tested)   With PROM, BLEs,  increased tone, mild resistance to PROM  TRANSFERS: Assistive device utilized: None  Sit to stand: SBA Stand to sit: SBA GAIT: Gait pattern: step through pattern, decreased stride length, and narrow BOS Distance walked: 50 ft x 4 Assistive device utilized:  trial of single walking pole, SPC and None Level of assistance: SBA Comments: Unsteadiness noted with turns  FUNCTIONAL TESTS:  5 times sit to stand: 19.34 sec Timed up and go (TUG): 14.62 sec TUG cognitive: 19 sec (1 LOB during turn) MiniBESTest: 8/28 (Scores <22/28 indicate increased fall risk) Gait velocity:  16.63 sec:  1.97 ft/sec     TODAY'S TREATMENT:  DATE: 12/20/2021    PATIENT EDUCATION: Education details: PT eval results, POC, intiated HEP Person educated: Patient Education method: Explanation, Demonstration, and Handouts Education comprehension: verbalized understanding, returned demonstration, and needs further education  HOME EXERCISE PROGRAM: Access Code: [email or web address] URL: [email or web address] Date: 12/20/2021 Prepared by: [facility]  Exercises - Heel Toe Raises with Counter Support  - 1 x daily - 7 x weekly - 2 sets - 10 reps - Side to side weightshift  - 1 x daily - 7 x weekly - 2 sets - 10 reps  GOALS: Goals reviewed with patient? Yes  SHORT TERM GOALS: Target date: 01/18/2022  Pt will be independent with HEP for improved balance, strength, gait. Baseline: Goal status: INITIAL  2.  Pt will improve 5x sit<>stand to less than or equal to 15 sec to demonstrate improved functional strength and transfer efficiency. Baseline: 19.34 sec Goal status: INITIAL  3.  Pt will improve TUG score to less than or equal to 13.5 sec for decreased fall risk. Baseline:  Goal status: INITIAL  4.  Pt will verbalize understanding of local  Parkinson's disease related resources. Baseline:  Goal status: INITIAL   LONG TERM GOALS: Target date: 02/15/2022  Pt will be independent with HEP for improved balance, gait, strength. Baseline:  Goal status: INITIAL  2.  Pt will improve 5x sit<>stand to less than or equal to 13 sec to demonstrate improved functional strength and transfer efficiency.  Baseline: 19.34 sec Goal status: INITIAL  3.  Pt will improve TUG/TUG cognitive score to less than or equal to 10% difference sec for decreased fall risk.  Baseline: 1462, 19 sec Goal status: INITIAL  4.  Pt will improve MiniBESTest score to at least 14/28 to decrease fall risk. Baseline: 8/28 Goal status: INITIAL  5.  Pt will improve gait velocity to at least 2.3 ft/sec for improved gait efficiency and safety.  Baseline: 1.97 ft/sec Goal status: INITIAL   ASSESSMENT:  CLINICAL IMPRESSION: ***Patient is a 76 y.o. male who was seen today for physical therapy evaluation and treatment for Parkinson's disease and peripheral neuropathy.  He presents to OPPT eval today with reports of worsening balance over the past 3-4 months, with noted LUE and LLE weakness and tremors.  He presents with decreased balance, decreased strength, decreased timing and coordination of gait, decreased vestibular system use for balance, postural instability.  He is at increased risk of falls per FTSTS, TUG, MiniBESTest, and gait velocity scores.  He will benefit from skilled PT to address the above stated deficits to improve overall functional mobility and decrease fall risk.   OBJECTIVE IMPAIRMENTS: Abnormal gait, decreased balance, decreased knowledge of use of DME, decreased mobility, difficulty walking, decreased strength, and impaired tone.   ACTIVITY LIMITATIONS: standing, squatting, transfers, dressing, locomotion level, and caring for others  PARTICIPATION LIMITATIONS: cleaning, laundry, and community activity  PERSONAL FACTORS: 3+ comorbidities: See  above PMH  are also affecting patient's functional outcome.   REHAB POTENTIAL: Good  CLINICAL DECISION MAKING: Evolving/moderate complexity  EVALUATION COMPLEXITY: Moderate  PLAN:  PT FREQUENCY: 2x/week  PT DURATION: 8 weeks plus eval visit (9 weeks total POC)  PLANNED INTERVENTIONS: Therapeutic exercises, Therapeutic activity, Neuromuscular re-education, Balance training, Gait training, Patient/Family education, Self Care, Stair training, and DME instructions  PLAN FOR NEXT SESSION: ***Review HEP initiated this visit; work on ankle, hip, step strategies for balance; gait training with cane versus walker.     Carnie Bruemmer W., PT 12/24/2021, 8:25 AM  [facility] at [facility] [place], [place], [place] [address] Phone # [phone] Fax # [phone]

## 2021-12-26 ENCOUNTER — Ambulatory Visit: Payer: PPO | Admitting: Physical Therapy

## 2021-12-28 ENCOUNTER — Ambulatory Visit: Payer: PPO | Attending: Neurology | Admitting: Physical Therapy

## 2021-12-28 ENCOUNTER — Encounter: Payer: Self-pay | Admitting: Physical Therapy

## 2021-12-28 DIAGNOSIS — R2689 Other abnormalities of gait and mobility: Secondary | ICD-10-CM

## 2021-12-28 DIAGNOSIS — R2681 Unsteadiness on feet: Secondary | ICD-10-CM

## 2021-12-28 DIAGNOSIS — M6281 Muscle weakness (generalized): Secondary | ICD-10-CM | POA: Diagnosis present

## 2021-12-28 NOTE — Therapy (Signed)
OUTPATIENT PHYSICAL THERAPY NEURO TREATMENT NOTES   Patient Name: Anthony [name] MRN: [id number] DOB:05-03-1945, 76 y.o., male Today's Date: 12/28/2021   PCP: Domenick Gong MD REFERRING PROVIDER: Larey Seat, MD  END OF SESSION:  PT End of Session - 12/28/21 0838     Visit Number 2    Number of Visits 17    Date for PT Re-Evaluation 02/15/22    Authorization Type [employer]    PT Start Time 0843    PT Stop Time 0925    PT Time Calculation (min) 42 min    Equipment Utilized During Treatment Gait belt    Activity Tolerance Patient tolerated treatment well    Behavior During Therapy WFL for tasks assessed/performed              Past Medical History:  Diagnosis Date   BPH (benign prostatic hyperplasia)    Diabetes mellitus type 2, controlled ([place])    Fatty liver    GERD (gastroesophageal reflux disease)    Gout    last flare up last week right ankle    Heart disease    HTN (hypertension)    Hyperlipidemia    Hypothyroidism    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   Obesity (BMI 30.0-34.9) 05/14/2013   OSA on CPAP    cpap setting of 10/ 13   Pancreatitis dx march 2016   Pneumonia 12 years ago   Past Surgical History:  Procedure Laterality Date   BACK SURGERY  10/2009   Cervical, arterior   CARPAL TUNNEL RELEASE Left 2003   CARPAL TUNNEL RELEASE Bilateral    CATARACT EXTRACTION Bilateral 01/2012   CORONARY BALLOON ANGIOPLASTY N/A 08/28/2021   Procedure: CORONARY BALLOON ANGIOPLASTY;  Surgeon: Nigel Mormon, MD;  Location: [facility];  Service: Cardiovascular;  Laterality: N/A;   EUS N/A 07/15/2014   Procedure: FULL UPPER ENDOSCOPIC ULTRASOUND (EUS) RADIAL;  Surgeon: Carol Ada, MD;  Location: [facility];  Service: Endoscopy;  Laterality: N/A;   LEFT HEART CATH AND CORONARY ANGIOGRAPHY N/A 05/18/2019   Procedure: LEFT HEART CATH AND CORONARY ANGIOGRAPHY;  Surgeon: Adrian Prows, MD;  Location: [facility];  Service:  Cardiovascular;  Laterality: N/A;   LEFT HEART CATH AND CORONARY ANGIOGRAPHY N/A 08/28/2021   Procedure: LEFT HEART CATH AND CORONARY ANGIOGRAPHY;  Surgeon: Nigel Mormon, MD;  Location: [place];  Service: Cardiovascular;  Laterality: N/A;   NASAL SINUS SURGERY  1981   RETINAL Bilateral 06/2013   Retinal peel   SHOULDER SURGERY Right 2003   Patient Active Problem List   Diagnosis Date Noted   Parkinsonism 12/06/2021   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, with long-term current use of insulin ([place]) 08/07/2018   Morbid obesity ([place]) 08/28/2017   Neuropathy 08/28/2017   Numbness and tingling of both legs below knees 11/10/2013   OSA on CPAP 05/14/2013   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   Obesity (BMI 30.0-34.9) 05/14/2013    ONSET DATE: 12/06/2021  REFERRING DIAG:  G47.33 (ICD-10-CM) - OSA on CPAP  G20.C (  ICD-10-CM) - Parkinsonism, unspecified Parkinsonism type  E11.3391,Z79.4 (ICD-10-CM) - 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  No falls. Pt accompanied by: self  PERTINENT HISTORY: The patient has a history of diabetes mellitus type 2 diagnosed in 2007, hyperglycemia, basal insulin was started 2019, he has a history of hypertension angina pectoris and has followed with Dr. Virgina Jock his cardiologist.  Hyperlipidemia hypothyroidism noted in 2008 history of gout and benign prostate hyperplasia, pancreatitis bout in March 2016.  COVID in July 2022. Eye vision loss, 2020, right eye  DM related or stroke.     PAIN:  Are you having pain? Yes: NPRS scale: varies/10 Pain location: feet Pain description: burning and tingling Aggravating factors: worse as the day goes on, worse at night Relieving factors: medication, cream  PRECAUTIONS: Fall  WEIGHT BEARING RESTRICTIONS: No  FALLS: Has patient fallen in last 6 months? No  LIVING ENVIRONMENT: Lives with: lives with their spouse Lives in: House/apartment Stairs: Yes: External: 2-4 steps; on right going up Has following equipment at home:  none  PLOF: Independent  PATIENT GOALS: Pt's goals for therapy are to improve balance and strength  OBJECTIVE:    TODAY'S TREATMENT: 12/28/2021 Activity Comments  Reviewed HEP from last visit: Ankle pumps x 10 Wide BOS lateral weightshifting x 10 Return demo understanding  Stagger stance forward/back rocking x 10 reps each leg   Step strategy work: -lateral x 10 -posterior x 10 -forward x 10 -forward/back x 10 with unsteadiness and decreased posterior step length   SLS:  alt step taps x 10, consecutive step taps x 10 UE support  Sidestepping along counter x 3 reps; forward/backward stepping at counter, 3 reps Cues for step length, UE support  NuStep, Level 3, BLES x 8 minutes for leg strengthening Cues to keep SPM >70 HR 71, O2 95% after workout   Gait training with SPC 100 x 2  ft, then 150 ft  Cues for cane sequence, improves with repetition  Access Code: [email or web address] URL: [email or web address] Date: 12/28/2021-updates Prepared by: [facility] -  Outpatient  Rehab - [facility]  Exercises - Heel Toe Raises with Counter Support  - 1 x daily - 7 x weekly - 2 sets - 10 reps - Side to side weightshift  - 1 x daily - 7 x weekly - 2 sets - 10 reps - Staggered Stance Forward Backward Weight Shift with Counter Support  - 1 x daily - 5 x weekly - 2 sets - 10 reps - Side Stepping with Counter Support  - 1 x daily - 5 x weekly - 2 sets - 10 reps - Alternating Step Taps with Counter Support  - 1 x daily - 5 x weekly - 2 sets - 10 reps  PATIENT EDUCATION: Education details: HEP update Person educated: Patient Education method: Explanation, Demonstration, and Handouts Education comprehension: verbalized understanding, returned demonstration, and needs further education  --------------------------------------------------------------------------------- Objective measures below taken at initial evaluation: DIAGNOSTIC FINDINGS: DaT Scan 09/2021: Ioflupane scan favored within normal limits. No reduced radiotracer activity in basal ganglia to suggest Parkinson's syndrome pathology.  COGNITION: Overall cognitive status: Within functional limits for tasks assessed   SENSATION: Light touch: Impaired Did not feel light tough at feet bilaterally  LOWER EXTREMITY ROM:   WFL BLEs And MMT  MMT Right Eval Left Eval  Hip flexion 4+ 4  Hip extension    Hip abduction 4 4  Hip adduction 4 4  Hip internal rotation    Hip external rotation    Knee flexion 4+ 4  Knee extension 4+ 4  Ankle dorsiflexion 5 3+  Ankle plantarflexion    Ankle inversion    Ankle eversion     (Blank rows = not tested)   With PROM, BLEs, increased tone, mild resistance to PROM  TRANSFERS: Assistive device utilized: None  Sit to stand: SBA Stand to  sit: SBA GAIT: Gait pattern: step through pattern, decreased stride length, and narrow BOS Distance walked: 50 ft x 4 Assistive device utilized:  trial of single walking pole, SPC and None Level of assistance: SBA Comments: Unsteadiness noted with turns  FUNCTIONAL TESTS:  5 times sit to stand: 19.34 sec Timed up and go (TUG): 14.62 sec TUG cognitive: 19 sec (1 LOB during turn) MiniBESTest: 8/28 (Scores <22/28 indicate increased fall risk) Gait velocity:  16.63 sec:  1.97 ft/sec     TODAY'S TREATMENT:                                                                                                                              DATE: 12/20/2021    PATIENT EDUCATION: Education details: PT eval results, POC, intiated HEP Person educated: Patient Education method: Explanation, Demonstration, and Handouts Education comprehension: verbalized understanding, returned demonstration, and needs further education  HOME EXERCISE PROGRAM: Access Code: [email or web address] URL: [email or web address] Date: 12/20/2021 Prepared by: [facility]  Exercises - Heel Toe Raises with Counter Support  - 1 x daily - 7 x weekly -  2 sets - 10 reps - Side to side weightshift  - 1 x daily - 7 x weekly - 2 sets - 10 reps  GOALS: Goals reviewed with patient? Yes  SHORT TERM GOALS: Target date: 01/18/2022  Pt will be independent with HEP for improved balance, strength, gait. Baseline: Goal status: INITIAL  2.  Pt will improve 5x sit<>stand to less than or equal to 15 sec to demonstrate improved functional strength and transfer efficiency. Baseline: 19.34 sec Goal status: INITIAL  3.  Pt will improve TUG score to less than or equal to 13.5 sec for decreased fall risk. Baseline:  Goal status: INITIAL  4.  Pt will verbalize understanding of local Parkinson's disease related resources. Baseline:  Goal status: INITIAL   LONG TERM GOALS: Target date:  02/15/2022  Pt will be independent with HEP for improved balance, gait, strength. Baseline:  Goal status: INITIAL  2.  Pt will improve 5x sit<>stand to less than or equal to 13 sec to demonstrate improved functional strength and transfer efficiency.  Baseline: 19.34 sec Goal status: INITIAL  3.  Pt will improve TUG/TUG cognitive score to less than or equal to 10% difference sec for decreased fall risk.  Baseline: 1462, 19 sec Goal status: INITIAL  4.  Pt will improve MiniBESTest score to at least 14/28 to decrease fall risk. Baseline: 8/28 Goal status: INITIAL  5.  Pt will improve gait velocity to at least 2.3 ft/sec for improved gait efficiency and safety.  Baseline: 1.97 ft/sec Goal status: INITIAL   ASSESSMENT:  CLINICAL IMPRESSION: Skilled PT session today focused on continuing to progress balance strategy work.  Worked on progressing HEP with stagger stance hip/ankle strategy work as well as step strategy and SLS.  Pt requires UE support for stability, with increased difficulty with combined forward/back stepping and SLS work.  Trial of gait with cane today, with pt initially needing cues for sequence, improving with repetition but will need more practice.  OBJECTIVE IMPAIRMENTS: Abnormal gait, decreased balance, decreased knowledge of use of DME, decreased mobility, difficulty walking, decreased strength, and impaired tone.   ACTIVITY LIMITATIONS: standing, squatting, transfers, dressing, locomotion level, and caring for others  PARTICIPATION LIMITATIONS: cleaning, laundry, and community activity  PERSONAL FACTORS: 3+ comorbidities: See above PMH  are also affecting patient's functional outcome.   REHAB POTENTIAL: Good  CLINICAL DECISION MAKING: Evolving/moderate complexity  EVALUATION COMPLEXITY: Moderate  PLAN:  PT FREQUENCY: 2x/week  PT DURATION: 8 weeks plus eval visit (9 weeks total POC)  PLANNED INTERVENTIONS: Therapeutic exercises, Therapeutic activity,  Neuromuscular re-education, Balance training, Gait training, Patient/Family education, Self Care, Stair training, and DME instructions  PLAN FOR NEXT SESSION: Review updates to HEP ; continue to work on ankle, hip, step strategies for balance; gait training with cane versus walker.     Frazier Butt., PT 12/28/2021, 9:27 AM  [facility] at [facility] [address], [place], [place] [address] Phone # [phone] Fax # [phone]

## 2021-12-31 ENCOUNTER — Ambulatory Visit: Payer: PPO | Admitting: Physical Therapy

## 2022-01-01 NOTE — Therapy (Signed)
OUTPATIENT PHYSICAL THERAPY NEURO TREATMENT NOTES   Patient Name: Anthony [name] MRN: [id number] DOB:[birth year], 76 y.o., male Today's Date: 01/02/2022   PCP: Domenick Gong MD REFERRING PROVIDER: Larey Seat, MD  END OF SESSION:  PT End of Session - 01/02/22 1533     Visit Number 3    Number of Visits 17    Date for PT Re-Evaluation 02/15/22    Authorization Type [employer]    PT Start Time 1443    PT Stop Time 1525    PT Time Calculation (min) 42 min    Equipment Utilized During Treatment Gait belt    Activity Tolerance Patient tolerated treatment well    Behavior During Therapy WFL for tasks assessed/performed               Past Medical History:  Diagnosis Date   BPH (benign prostatic hyperplasia)    Diabetes mellitus type 2, controlled ([place])    Fatty liver    GERD (gastroesophageal reflux disease)    Gout    last flare up last week right ankle    Heart disease    HTN (hypertension)    Hyperlipidemia    Hypothyroidism    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   Obesity (BMI 30.0-34.9) 05/14/2013   OSA on CPAP    cpap setting of 10/ 13   Pancreatitis dx march 2016   Pneumonia 12 years ago   Past Surgical History:  Procedure Laterality Date   BACK SURGERY  10/2009   Cervical, arterior   CARPAL TUNNEL RELEASE Left 2003   CARPAL TUNNEL RELEASE Bilateral    CATARACT EXTRACTION Bilateral 01/2012   CORONARY BALLOON ANGIOPLASTY N/A 08/28/2021   Procedure: CORONARY BALLOON ANGIOPLASTY;  Surgeon: Nigel Mormon, MD;  Location: [facility];  Service: Cardiovascular;  Laterality: N/A;   EUS N/A 07/15/2014   Procedure: FULL UPPER ENDOSCOPIC ULTRASOUND (EUS) RADIAL;  Surgeon: Carol Ada, MD;  Location: [facility];  Service: Endoscopy;  Laterality: N/A;   LEFT HEART CATH AND CORONARY ANGIOGRAPHY N/A 05/18/2019   Procedure: LEFT HEART CATH AND CORONARY ANGIOGRAPHY;  Surgeon: Adrian Prows, MD;  Location: [facility];  Service:  Cardiovascular;  Laterality: N/A;   LEFT HEART CATH AND CORONARY ANGIOGRAPHY N/A 08/28/2021   Procedure: LEFT HEART CATH AND CORONARY ANGIOGRAPHY;  Surgeon: Nigel Mormon, MD;  Location: [facility];  Service: Cardiovascular;  Laterality: N/A;   NASAL SINUS SURGERY  1981   RETINAL Bilateral 06/2013   Retinal peel   SHOULDER SURGERY Right 2003   Patient Active Problem List   Diagnosis Date Noted   Parkinsonism 12/06/2021   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   Hyperkalemia 12/06/2020   Coronary artery disease of native artery of native heart with stable angina pectoris ([place]) 12/03/2019   Esophageal dysphagia 11/21/2019   Gastroesophageal reflux disease 11/21/2019   Family history of colon cancer 11/21/2019   Abnormal stress test 05/11/2019   Mixed hyperlipidemia 05/11/2019   Angina pectoris ([place]) 08/07/2018   Essential hypertension 08/07/2018   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, with long-term current use of insulin ([place]) 08/07/2018   Morbid obesity ([place]) 08/28/2017   Neuropathy 08/28/2017   Numbness and tingling of both legs below knees 11/10/2013   OSA on CPAP 05/14/2013   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   Obesity (BMI 30.0-34.9) 05/14/2013    ONSET DATE: 12/06/2021  REFERRING DIAG:  G47.33 (ICD-10-CM) - OSA on CPAP  G20.C (ICD-10-CM) - Parkinsonism, unspecified Parkinsonism type  E11.3391,Z79.4 (ICD-10-CM) - 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 HEP is going "pretty good." Reports that he uses walking poles when walking outside for fitness.  Pt accompanied by: self  PERTINENT HISTORY: The patient has a history of diabetes mellitus type 2 diagnosed in 2007, hyperglycemia, basal insulin was started 2019, he has a history of hypertension angina pectoris and has followed with Dr. Virgina Jock his cardiologist.  Hyperlipidemia hypothyroidism noted in 2008 history of gout and benign prostate hyperplasia, pancreatitis bout in March 2016.  COVID in July 2022. Eye vision loss, 2020, right eye  DM related or stroke.     PAIN:  Are you having pain? Yes: NPRS scale: 0/10 Pain location: feet Pain description: burning and tingling Aggravating factors: worse as the day goes on, worse at night Relieving factors: medication, cream  PRECAUTIONS: Fall  WEIGHT BEARING RESTRICTIONS: No  FALLS: Has patient fallen in last 6 months? No  LIVING ENVIRONMENT: Lives with: lives with their spouse Lives in: House/apartment Stairs: Yes: External: 2-4 steps; on right going up Has following equipment at home:  none  PLOF: Independent  PATIENT GOALS: Pt's goals for therapy are to improve balance and strength  OBJECTIVE:     TODAY'S TREATMENT: 01/02/22 Activity Comments  L4 x 6 mins LE only Maintaining ~80 SPM  Reviewed updates to HEP: staggered fwd/back wt shift sidestepping alt step taps Performed ant/pos staggered wt shifts in front of mirror for increased feedback and added in arm swing for greater challenge; some increased posterior wt shift with activities   Standing toe tap on 1st, 2nd step, down 1 LOB with reaching strategy to correct   standing on foam ant/pos wt shifts Tendency to use hip strategy; encouraged hip strategy  fwd/back stepping 10x each Cueing to widen BOS and  avoid excessive posterior trunk lean with posterior step  backwards walking, then with added hand to hand ball toss Good stability without dual task; required cueing to slow down and control steps with  ball toss d/t too much backwards momentum       HOME EXERCISE PROGRAM Last updated: 01/02/22 Access Code: [email or web address] URL: [email or web address] Date: 01/02/2022 Prepared by: [facility]  Exercises - Heel Toe Raises with Counter Support  - 1 x daily - 7 x weekly - 2 sets - 10 reps - Side to side weightshift  - 1 x daily - 7 x weekly - 2 sets - 10 reps - Side Stepping with Counter Support  - 1 x daily - 5 x weekly - 2 sets - 10 reps - Staggered Stance Weight Shift with Arms Reaching  - 1 x daily - 5 x weekly - 2 sets - 10 reps - Standing Anterior Posterior Weight Shift with Chair  - 1 x daily - 5 x weekly - 2 sets - 10 reps - Alternating Step Forward with Support  - 1 x daily - 5 x weekly - 2 sets - 10 reps   PATIENT EDUCATION: Education details: HEP update- to be performed at counter top  Person educated: Patient Education method: Explanation, Demonstration, Tactile cues, Verbal cues, and Handouts Education comprehension: verbalized understanding and returned demonstration     --------------------------------------------------------------------------------- Objective measures below taken at initial evaluation: DIAGNOSTIC FINDINGS: DaT Scan 09/2021: Ioflupane scan favored within normal limits. No reduced radiotracer activity in basal ganglia to suggest Parkinson's syndrome pathology.  COGNITION: Overall cognitive status: Within functional limits for tasks assessed   SENSATION: Light touch: Impaired Did not feel light tough at feet bilaterally  LOWER EXTREMITY ROM:   WFL BLEs And MMT  MMT Right Eval Left Eval  Hip flexion 4+ 4  Hip extension    Hip abduction 4 4  Hip adduction 4 4  Hip internal rotation    Hip external  rotation    Knee flexion 4+ 4  Knee extension 4+ 4  Ankle dorsiflexion 5 3+  Ankle plantarflexion    Ankle inversion    Ankle eversion     (Blank rows = not tested)   With PROM, BLEs, increased tone, mild resistance to PROM  TRANSFERS: Assistive device utilized: None  Sit to stand: SBA Stand to sit: SBA GAIT: Gait pattern: step through pattern, decreased stride length, and narrow BOS Distance walked: 50 ft x 4 Assistive device utilized:  trial of single walking pole, SPC and None Level of assistance: SBA Comments: Unsteadiness noted with turns  FUNCTIONAL TESTS:  5 times sit to stand: 19.34 sec Timed up and go (TUG): 14.62 sec TUG cognitive: 19 sec (1 LOB during turn) MiniBESTest: 8/28 (Scores <22/28 indicate increased fall risk) Gait velocity:  16.63 sec:  1.97 ft/sec     TODAY'S TREATMENT:                                                                                                                              DATE: 12/20/2021    PATIENT EDUCATION: Education details: PT eval results,  POC, intiated HEP Person educated: Patient Education method: Explanation, Demonstration, and Handouts Education comprehension: verbalized understanding, returned demonstration, and needs further education  HOME EXERCISE PROGRAM: Access Code: [email or web address] URL: [email or web address] Date: 12/20/2021 Prepared by: [facility]  Exercises - Heel Toe Raises with Counter Support  - 1 x daily - 7 x weekly - 2 sets - 10 reps - Side to side weightshift  - 1 x daily - 7 x weekly - 2 sets - 10 reps  GOALS: Goals reviewed with patient? Yes  SHORT TERM GOALS: Target date: 01/18/2022  Pt will be independent with HEP for improved balance, strength, gait. Baseline: Goal status: IN PROGRESS  2.  Pt will improve 5x sit<>stand to less than or equal to 15 sec to demonstrate improved functional strength and transfer efficiency. Baseline: 19.34  sec Goal status: IN PROGRESS  3.  Pt will improve TUG score to less than or equal to 13.5 sec for decreased fall risk. Baseline:  Goal status: IN PROGRESS  4.  Pt will verbalize understanding of local Parkinson's disease related resources. Baseline:  Goal status: IN PROGRESS   LONG TERM GOALS: Target date: 02/15/2022  Pt will be independent with HEP for improved balance, gait, strength. Baseline:  Goal status: IN PROGRESS  2.  Pt will improve 5x sit<>stand to less than or equal to 13 sec to demonstrate improved functional strength and transfer efficiency.  Baseline: 19.34 sec Goal status: IN PROGRESS  3.  Pt will improve TUG/TUG cognitive score to less than or equal to 10% difference sec for decreased fall risk.  Baseline: 1462, 19 sec Goal status: IN PROGRESS  4.  Pt will improve MiniBESTest score to at least 14/28 to decrease fall risk. Baseline: 8/28 Goal status: IN PROGRESS  5.  Pt will improve gait velocity to at least 2.3 ft/sec for improved gait efficiency and safety.  Baseline: 1.97 ft/sec Goal status: IN PROGRESS   ASSESSMENT:  CLINICAL IMPRESSION: Patient arrived to session without complaints. Reviewed most recent HEP update, providing cueing to increase amplitude and stretch with weight shifting. Noticed tendency for increased posterior wt shift and trunk lean occasionally with standing activities. Worked on finding limits of stability and use of ankle strategy on foam; patient with tendency to use hip strategy instead. Updated HEP to include challenging activities from today. Patient reported understanding and without complaints at end of session.   OBJECTIVE IMPAIRMENTS: Abnormal gait, decreased balance, decreased knowledge of use of DME, decreased mobility, difficulty walking, decreased strength, and impaired tone.   ACTIVITY LIMITATIONS: standing, squatting, transfers, dressing, locomotion level, and caring for others  PARTICIPATION LIMITATIONS: cleaning,  laundry, and community activity  PERSONAL FACTORS: 3+ comorbidities: See above PMH  are also affecting patient's functional outcome.   REHAB POTENTIAL: Good  CLINICAL DECISION MAKING: Evolving/moderate complexity  EVALUATION COMPLEXITY: Moderate  PLAN:  PT FREQUENCY: 2x/week  PT DURATION: 8 weeks plus eval visit (9 weeks total POC)  PLANNED INTERVENTIONS: Therapeutic exercises, Therapeutic activity, Neuromuscular re-education, Balance training, Gait training, Patient/Family education, Self Care, Stair training, and DME instructions  PLAN FOR NEXT SESSION: Review updates to HEP ; continue to work on ankle, hip, step strategies for balance; gait training with cane versus walker.     Janene Harvey, PT, DPT 01/02/22 3:34 PM  [facility] [address] [place], [place], [place] [address] Phone # [phone] Fax # [phone]

## 2022-01-02 ENCOUNTER — Ambulatory Visit: Payer: PPO | Admitting: Physical Therapy

## 2022-01-02 ENCOUNTER — Encounter: Payer: Self-pay | Admitting: Physical Therapy

## 2022-01-02 DIAGNOSIS — R2689 Other abnormalities of gait and mobility: Secondary | ICD-10-CM

## 2022-01-02 DIAGNOSIS — R2681 Unsteadiness on feet: Secondary | ICD-10-CM

## 2022-01-02 DIAGNOSIS — M6281 Muscle weakness (generalized): Secondary | ICD-10-CM

## 2022-01-03 NOTE — Therapy (Signed)
OUTPATIENT PHYSICAL THERAPY NEURO TREATMENT NOTES   Patient Name: Anthony [name] MRN: [id number] DOB:Sep 09, 1945, 76 y.o., male Today's Date: 01/07/2022   PCP: Domenick Gong MD REFERRING PROVIDER: Larey Seat, MD  END OF SESSION:  PT End of Session - 01/07/22 1528     Visit Number 5    Number of Visits 17    Date for PT Re-Evaluation 02/15/22    Authorization Type [employer]    PT Start Time 1445    PT Stop Time 1527    PT Time Calculation (min) 42 min    Equipment Utilized During Treatment Gait belt    Activity Tolerance Patient tolerated treatment well    Behavior During Therapy WFL for tasks assessed/performed               Past Medical History:  Diagnosis Date   BPH (benign prostatic hyperplasia)    Diabetes mellitus type 2, controlled ([place])    Fatty liver    GERD (gastroesophageal reflux disease)    Gout    last flare up last week right ankle    Heart disease    HTN (hypertension)    Hyperlipidemia    Hypothyroidism    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   Obesity (BMI 30.0-34.9) 05/14/2013   OSA on CPAP    cpap setting of 10/ 13   Pancreatitis dx march 2016   Pneumonia 12 years ago   Past Surgical History:  Procedure Laterality Date   BACK SURGERY  10/2009   Cervical, arterior   CARPAL TUNNEL RELEASE Left 2003   CARPAL TUNNEL RELEASE Bilateral    CATARACT EXTRACTION Bilateral 01/2012   CORONARY BALLOON ANGIOPLASTY N/A 08/28/2021   Procedure: CORONARY BALLOON ANGIOPLASTY;  Surgeon: Nigel Mormon, MD;  Location: [facility];  Service: Cardiovascular;  Laterality: N/A;   EUS N/A 07/15/2014   Procedure: FULL UPPER ENDOSCOPIC ULTRASOUND (EUS) RADIAL;  Surgeon: Carol Ada, MD;  Location: [facility];  Service: Endoscopy;  Laterality: N/A;   LEFT HEART CATH AND CORONARY ANGIOGRAPHY N/A 05/18/2019   Procedure: LEFT HEART CATH AND CORONARY ANGIOGRAPHY;  Surgeon: Adrian Prows, MD;  Location: [facility];  Service:  Cardiovascular;  Laterality: N/A;   LEFT HEART CATH AND CORONARY ANGIOGRAPHY N/A 08/28/2021   Procedure: LEFT HEART CATH AND CORONARY ANGIOGRAPHY;  Surgeon: Nigel Mormon, MD;  Location: [facility];  Service: Cardiovascular;  Laterality: N/A;   NASAL SINUS SURGERY  1981   RETINAL Bilateral 06/2013   Retinal peel   SHOULDER SURGERY Right 2003   Patient Active Problem List   Diagnosis Date Noted   Parkinsonism 12/06/2021   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   Hyperkalemia 12/06/2020   Coronary artery disease of native artery of native heart with stable angina pectoris ([place]) 12/03/2019   Esophageal dysphagia 11/21/2019   Gastroesophageal reflux disease 11/21/2019   Family history of colon cancer 11/21/2019   Abnormal stress test 05/11/2019   Mixed hyperlipidemia 05/11/2019   Angina pectoris ([place]) 08/07/2018   Essential hypertension 08/07/2018   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, with long-term current use of insulin ([place]) 08/07/2018   Morbid obesity ([place]) 08/28/2017   Neuropathy 08/28/2017   Numbness and tingling of both legs below knees 11/10/2013   OSA on CPAP 05/14/2013   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   Obesity (BMI 30.0-34.9) 05/14/2013    ONSET DATE: 12/06/2021  REFERRING DIAG:  G47.33 (ICD-10-CM) - OSA on CPAP  G20.C (ICD-10-CM) - Parkinsonism, unspecified Parkinsonism type  E11.3391,Z79.4 (ICD-10-CM) - 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, with long-term current use of insulin, macular edema presence unspecified (H[facility])  G62.2 (ICD-10-CM) - 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  Pt accompanied by: self  PERTINENT HISTORY: The patient has a history of diabetes mellitus type 2 diagnosed in 2007, hyperglycemia, basal insulin was started 2019, he has a history of hypertension angina pectoris and has followed with Dr. Virgina Jock his cardiologist.  Hyperlipidemia hypothyroidism noted in 2008 history of gout and benign prostate hyperplasia, pancreatitis bout in March 2016.  COVID in July 2022. Eye vision loss, 2020, right eye  DM related or stroke.     PAIN:  Are you having pain? Yes: NPRS scale: 0/10 Pain location: feet Pain description: burning and tingling Aggravating factors: worse as the day goes on, worse at night Relieving factors: medication, cream  PRECAUTIONS: Fall  WEIGHT BEARING RESTRICTIONS: No  FALLS: Has patient fallen in last 6 months? No  LIVING ENVIRONMENT: Lives with: lives with their spouse Lives in: House/apartment Stairs: Yes: External: 2-4 steps; on right going up Has following equipment at home:  none  PLOF: Independent  PATIENT GOALS: Pt's goals for therapy are to improve balance and strength  OBJECTIVE:     TODAY'S TREATMENT: 01/07/22 Activity Comments  Nustep Les only L4x6 min  Maintaining 70-80 SPM  figure 8 turns + ball toss Cueing for straight up and higher ball toss; c/o mild dizziness when looking overhead which resolved   figure 8 turns backwards CGA and cueing to decrease speed and increase step length d/t tendency to shuffle and gain too much backwards momentum   4 square step Cueing to avoid circumduction with R LE; improved execution with each rep; CGA  quick step backs from 6" step 15x each Weaning UE support; ultimately able to perform at good speed without UE support   romberg EO/EC foam 2x30"  Mod-severe  sway with EC  resisted walking with pulley backwards 6x 25lbs, each side 3x 20# Occasional LOB particularly to each side  HOME EXERCISE PROGRAM Last updated: 01/02/22 Access Code: [email or web address] URL: [email or web address] Date: 01/02/2022 Prepared by: [facility]  Exercises - Heel Toe Raises with Counter Support  - 1 x daily - 7 x weekly - 2 sets - 10 reps - Side to side weightshift  - 1 x daily - 7 x weekly - 2 sets - 10 reps - Side Stepping with Counter Support  - 1 x daily - 5 x weekly - 2 sets - 10 reps - Staggered Stance Weight Shift with Arms Reaching  - 1 x daily - 5 x weekly - 2 sets - 10 reps - Standing Anterior Posterior Weight Shift with Chair  - 1 x daily - 5 x weekly - 2 sets - 10 reps - Alternating Step Forward with Support  - 1 x daily - 5 x weekly - 2 sets - 10 reps      --------------------------------------------------------------------------------- Objective measures below taken at initial evaluation: DIAGNOSTIC FINDINGS: DaT Scan 09/2021: Ioflupane scan favored within normal limits. No reduced radiotracer activity in basal ganglia to suggest Parkinson's syndrome pathology.  COGNITION: Overall cognitive status: Within functional limits for tasks assessed   SENSATION: Light touch: Impaired Did not feel light tough at feet bilaterally  LOWER EXTREMITY ROM:   WFL BLEs And MMT  MMT Right Eval Left Eval  Hip flexion 4+ 4  Hip extension    Hip abduction 4 4  Hip adduction 4 4  Hip internal rotation    Hip external rotation    Knee flexion 4+ 4  Knee extension 4+ 4  Ankle dorsiflexion 5 3+  Ankle plantarflexion    Ankle inversion    Ankle eversion     (Blank rows = not tested)   With PROM, BLEs, increased tone, mild resistance to PROM  TRANSFERS: Assistive device utilized: None  Sit to stand: SBA Stand to sit: SBA GAIT: Gait pattern: step through pattern, decreased stride length, and narrow  BOS Distance walked: 50 ft x 4 Assistive device utilized:  trial of single walking pole, SPC and None Level of assistance: SBA Comments: Unsteadiness noted with turns  FUNCTIONAL TESTS:  5 times sit to stand: 19.34 sec Timed up and go (TUG): 14.62 sec TUG cognitive: 19 sec (1 LOB during turn) MiniBESTest: 8/28 (Scores <22/28 indicate increased fall risk) Gait velocity:  16.63 sec:  1.97 ft/sec     TODAY'S TREATMENT:                                                                                                                              DATE: 12/20/2021    PATIENT EDUCATION: Education details: PT eval results, POC, intiated HEP Person educated: Patient Education method: Explanation, Demonstration, and Handouts Education comprehension: verbalized understanding, returned demonstration, and needs further education  HOME EXERCISE PROGRAM: Access Code: [email or web address] URL: [email or web address] Date: 12/20/2021 Prepared by: [facility]  Exercises -  Heel Toe Raises with Counter Support  - 1 x daily - 7 x weekly - 2 sets - 10 reps - Side to side weightshift  - 1 x daily - 7 x weekly - 2 sets - 10 reps  GOALS: Goals reviewed with patient? Yes  SHORT TERM GOALS: Target date: 01/18/2022  Pt will be independent with HEP for improved balance, strength, gait. Baseline: Goal status: IN PROGRESS  2.  Pt will improve 5x sit<>stand to less than or equal to 15 sec to demonstrate improved functional strength and transfer efficiency. Baseline: 19.34 sec Goal status: IN PROGRESS  3.  Pt will improve TUG score to less than or equal to 13.5 sec for decreased fall risk. Baseline:  Goal status: IN PROGRESS  4.  Pt will verbalize understanding of local Parkinson's disease related resources. Baseline:  Goal status: IN PROGRESS   LONG TERM GOALS: Target date: 02/15/2022  Pt will be independent with HEP for improved balance, gait,  strength. Baseline:  Goal status: IN PROGRESS  2.  Pt will improve 5x sit<>stand to less than or equal to 13 sec to demonstrate improved functional strength and transfer efficiency.  Baseline: 19.34 sec Goal status: IN PROGRESS  3.  Pt will improve TUG/TUG cognitive score to less than or equal to 10% difference sec for decreased fall risk.  Baseline: 1462, 19 sec Goal status: IN PROGRESS  4.  Pt will improve MiniBESTest score to at least 14/28 to decrease fall risk. Baseline: 8/28 Goal status: IN PROGRESS  5.  Pt will improve gait velocity to at least 2.3 ft/sec for improved gait efficiency and safety.  Baseline: 1.97 ft/sec Goal status: IN PROGRESS   ASSESSMENT:  CLINICAL IMPRESSION: Patient arrived to session with report of some LE muscle soreness. Patient performed dynamic balance activities with addition of dual tasking activities. Required cueing for smooth and continuous stepping while increasing amplitude of ball toss. More difficulty evident with backwards walking. Stepping over obstacles was performed with good step length and stability. Similarly, patient able to perform quick backwards steps when prompted. Patient tolerated session well and without complaints at end of session.   OBJECTIVE IMPAIRMENTS: Abnormal gait, decreased balance, decreased knowledge of use of DME, decreased mobility, difficulty walking, decreased strength, and impaired tone.   ACTIVITY LIMITATIONS: standing, squatting, transfers, dressing, locomotion level, and caring for others  PARTICIPATION LIMITATIONS: cleaning, laundry, and community activity  PERSONAL FACTORS: 3+ comorbidities: See above PMH  are also affecting patient's functional outcome.   REHAB POTENTIAL: Good  CLINICAL DECISION MAKING: Evolving/moderate complexity  EVALUATION COMPLEXITY: Moderate  PLAN:  PT FREQUENCY: 2x/week  PT DURATION: 8 weeks plus eval visit (9 weeks total POC)  PLANNED INTERVENTIONS: Therapeutic  exercises, Therapeutic activity, Neuromuscular re-education, Balance training, Gait training, Patient/Family education, Self Care, Stair training, and DME instructions  PLAN FOR NEXT SESSION: Review updates to HEP ; continue to work on ankle, hip, step strategies for balance; gait training with cane versus walker.     Janene Harvey, PT, DPT 01/07/22 3:29 PM  [facility] [address] [place], [place], [place] [address] Phone # [phone] Fax # [phone]

## 2022-01-04 ENCOUNTER — Encounter: Payer: Self-pay | Admitting: Physical Therapy

## 2022-01-04 ENCOUNTER — Ambulatory Visit: Payer: PPO | Admitting: Physical Therapy

## 2022-01-04 DIAGNOSIS — R2681 Unsteadiness on feet: Secondary | ICD-10-CM | POA: Diagnosis not present

## 2022-01-04 DIAGNOSIS — M6281 Muscle weakness (generalized): Secondary | ICD-10-CM

## 2022-01-04 DIAGNOSIS — R2689 Other abnormalities of gait and mobility: Secondary | ICD-10-CM

## 2022-01-04 NOTE — Therapy (Signed)
OUTPATIENT PHYSICAL THERAPY NEURO TREATMENT NOTES   Patient Name: Anthony [name] MRN: [id number] DOB:Oct 09, 1945, 76 y.o., male Today's Date: 01/04/2022   PCP: Domenick Gong MD REFERRING PROVIDER: Larey Seat, MD  END OF SESSION:  PT End of Session - 01/04/22 0852     Visit Number 4    Number of Visits 17    Date for PT Re-Evaluation 02/15/22    Authorization Type [employer]    PT Start Time 0850    PT Stop Time 0930    PT Time Calculation (min) 40 min    Equipment Utilized During Treatment Gait belt    Activity Tolerance Patient tolerated treatment well    Behavior During Therapy WFL for tasks assessed/performed               Past Medical History:  Diagnosis Date   BPH (benign prostatic hyperplasia)    Diabetes mellitus type 2, controlled ([place])    Fatty liver    GERD (gastroesophageal reflux disease)    Gout    last flare up last week right ankle    Heart disease    HTN (hypertension)    Hyperlipidemia    Hypothyroidism    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   Obesity (BMI 30.0-34.9) 05/14/2013   OSA on CPAP    cpap setting of 10/ 13   Pancreatitis dx march 2016   Pneumonia 12 years ago   Past Surgical History:  Procedure Laterality Date   BACK SURGERY  10/2009   Cervical, arterior   CARPAL TUNNEL RELEASE Left 2003   CARPAL TUNNEL RELEASE Bilateral    CATARACT EXTRACTION Bilateral 01/2012   CORONARY BALLOON ANGIOPLASTY N/A 08/28/2021   Procedure: CORONARY BALLOON ANGIOPLASTY;  Surgeon: Nigel Mormon, MD;  Location: [facility];  Service: Cardiovascular;  Laterality: N/A;   EUS N/A 07/15/2014   Procedure: FULL UPPER ENDOSCOPIC ULTRASOUND (EUS) RADIAL;  Surgeon: Carol Ada, MD;  Location: [facility];  Service: Endoscopy;  Laterality: N/A;   LEFT HEART CATH AND CORONARY ANGIOGRAPHY N/A 05/18/2019   Procedure: LEFT HEART CATH AND CORONARY ANGIOGRAPHY;  Surgeon: Adrian Prows, MD;  Location: [facility];  Service:  Cardiovascular;  Laterality: N/A;   LEFT HEART CATH AND CORONARY ANGIOGRAPHY N/A 08/28/2021   Procedure: LEFT HEART CATH AND CORONARY ANGIOGRAPHY;  Surgeon: Nigel Mormon, MD;  Location: [facility];  Service: Cardiovascular;  Laterality: N/A;   NASAL SINUS SURGERY  1981   RETINAL Bilateral 06/2013   Retinal peel   SHOULDER SURGERY Right 2003   Patient Active Problem List   Diagnosis Date Noted   Parkinsonism 12/06/2021   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   Hyperkalemia 12/06/2020   Coronary artery disease of native artery of native heart with stable angina pectoris ([place]) 12/03/2019   Esophageal dysphagia 11/21/2019   Gastroesophageal reflux disease 11/21/2019   Family history of colon cancer 11/21/2019   Abnormal stress test 05/11/2019   Mixed hyperlipidemia 05/11/2019   Angina pectoris ([place]) 08/07/2018   Essential hypertension 08/07/2018   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, with long-term current use of insulin ([place]) 08/07/2018   Morbid obesity ([place]) 08/28/2017   Neuropathy 08/28/2017   Numbness and tingling of both legs below knees 11/10/2013   OSA on CPAP 05/14/2013   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   Obesity (BMI 30.0-34.9) 05/14/2013    ONSET DATE: 12/06/2021  REFERRING DIAG:  G47.33 (ICD-10-CM) - OSA on CPAP  G20.C (ICD-10-CM) - Parkinsonism, unspecified Parkinsonism type  E11.3391,Z79.4 (ICD-10-CM) - 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, with long-term current use of insulin, macular edema presence unspecified ([facility])  G62.2 (ICD-10-CM) - 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  I think therapy is helping some. Pt accompanied by: self  PERTINENT HISTORY: The patient has a history of diabetes mellitus type 2 diagnosed in 2007, hyperglycemia, basal insulin was started 2019, he has a history of hypertension angina pectoris and has followed with Dr. Virgina Jock his cardiologist.  Hyperlipidemia hypothyroidism noted in 2008 history of gout and benign prostate hyperplasia, pancreatitis bout in March 2016.  COVID in July 2022. Eye vision loss, 2020, right eye  DM related or stroke.     PAIN:  Are you having pain? Yes: NPRS scale: 0/10 Pain location: feet Pain description: burning and tingling Aggravating factors: worse as the day goes on, worse at night Relieving factors: medication, cream  PRECAUTIONS: Fall  WEIGHT BEARING RESTRICTIONS: No  FALLS: Has patient fallen in last 6 months? No  LIVING ENVIRONMENT: Lives with: lives with their spouse Lives in: House/apartment Stairs: Yes: External: 2-4 steps; on right going up Has following equipment at home:  none  PLOF: Independent  PATIENT GOALS: Pt's goals for therapy are to improve balance and strength  OBJECTIVE:    TODAY'S TREATMENT: 01/04/2022 Activity Comments  NuStep, Level 4, BLEs x 6 minutes Cues to keep SPM >80  Reviewed updates to HEP: -alternating forward stepping -ant/posterior weigthshift through ankles/hips at counter Cues to use hip strategy to hinge at hips slightly with ant/posterior weightshift  Forward/back walking at parallel bars, x 5 reps 1 UE support  Standing on incline: forward/back step and weigthshift x 10 reps; head turns/nods with EO, head steady EC x 20 sec Slight posterior lean with EC  Standing on Airex:   -marching in place x 10 -alt forward step taps x 10 -alt  back step taps x 10 -alt side step taps x 10 UE support, good foot clearance, initial cues for optimal weightshift through hips  Sit<>stand 2 x 5 reps   Minisquats standing on  Airex x 10, then additional squats to up on toes x 10   Short distance gait with head turns/head nods Min guard, slowed pace      HOME EXERCISE PROGRAM Last updated: 01/02/22 Access Code: [email or web address] URL: [email or web address] Date: 01/02/2022 Prepared by: [facility]  Exercises - Heel Toe Raises with Counter Support  - 1 x daily - 7 x weekly - 2 sets - 10 reps - Side to side weightshift  - 1 x daily - 7 x weekly - 2 sets - 10 reps - Side Stepping with Counter Support  - 1 x daily - 5 x weekly - 2 sets - 10 reps - Staggered Stance Weight Shift with Arms Reaching  - 1 x daily - 5 x weekly - 2 sets - 10 reps - Standing Anterior Posterior Weight Shift with Chair  - 1 x daily - 5 x weekly - 2 sets - 10 reps - Alternating Step Forward with Support  - 1 x daily - 5 x weekly - 2 sets - 10 reps   PATIENT EDUCATION: Education details: HEP update- to be performed at counter top  Person educated: Patient Education method: Explanation, Demonstration, Tactile cues, Verbal cues, and Handouts Education comprehension: verbalized understanding and returned demonstration     --------------------------------------------------------------------------------- Objective measures below taken at initial evaluation: DIAGNOSTIC FINDINGS: DaT Scan 09/2021: Ioflupane scan favored within normal limits. No reduced radiotracer activity in basal ganglia to suggest Parkinson's syndrome pathology.  COGNITION: Overall cognitive status: Within functional limits for tasks assessed   SENSATION: Light touch: Impaired Did not feel light tough at feet bilaterally  LOWER EXTREMITY ROM:   WFL BLEs And MMT  MMT Right Eval Left Eval  Hip flexion 4+ 4  Hip extension    Hip abduction 4 4   Hip adduction 4 4  Hip internal rotation    Hip external rotation    Knee flexion 4+ 4  Knee extension 4+ 4  Ankle dorsiflexion 5 3+  Ankle plantarflexion    Ankle inversion    Ankle eversion     (Blank rows = not tested)   With PROM, BLEs, increased tone, mild resistance to PROM  TRANSFERS: Assistive device utilized: None  Sit to stand: SBA Stand to sit: SBA GAIT: Gait pattern: step through pattern, decreased stride length, and narrow BOS Distance walked: 50 ft x 4 Assistive device utilized:  trial of single walking pole, SPC and None Level of assistance: SBA Comments: Unsteadiness noted with turns  FUNCTIONAL TESTS:  5 times sit to stand: 19.34 sec Timed up and go (TUG): 14.62 sec TUG cognitive: 19 sec (1 LOB during turn) MiniBESTest: 8/28 (Scores <22/28 indicate increased fall risk) Gait velocity:  16.63 sec:  1.97 ft/sec     TODAY'S TREATMENT:  DATE: 12/20/2021    PATIENT EDUCATION: Education details: PT eval results, POC, intiated HEP Person educated: Patient Education method: Explanation, Demonstration, and Handouts Education comprehension: verbalized understanding, returned demonstration, and needs further education  HOME EXERCISE PROGRAM: Access Code: [email or web address] URL: [email or web address] Date: 12/20/2021 Prepared by: [facility]  Exercises - Heel Toe Raises with Counter Support  - 1 x daily - 7 x weekly - 2 sets - 10 reps - Side to side weightshift  - 1 x daily - 7 x weekly - 2 sets - 10 reps  GOALS: Goals reviewed with patient? Yes  SHORT TERM GOALS: Target date: 01/18/2022  Pt will be independent with HEP for improved balance, strength, gait. Baseline: Goal status: IN PROGRESS  2.  Pt will improve 5x sit<>stand to less than or equal to 15 sec to demonstrate improved  functional strength and transfer efficiency. Baseline: 19.34 sec Goal status: IN PROGRESS  3.  Pt will improve TUG score to less than or equal to 13.5 sec for decreased fall risk. Baseline:  Goal status: IN PROGRESS  4.  Pt will verbalize understanding of local Parkinson's disease related resources. Baseline:  Goal status: IN PROGRESS   LONG TERM GOALS: Target date: 02/15/2022  Pt will be independent with HEP for improved balance, gait, strength. Baseline:  Goal status: IN PROGRESS  2.  Pt will improve 5x sit<>stand to less than or equal to 13 sec to demonstrate improved functional strength and transfer efficiency.  Baseline: 19.34 sec Goal status: IN PROGRESS  3.  Pt will improve TUG/TUG cognitive score to less than or equal to 10% difference sec for decreased fall risk.  Baseline: 1462, 19 sec Goal status: IN PROGRESS  4.  Pt will improve MiniBESTest score to at least 14/28 to decrease fall risk. Baseline: 8/28 Goal status: IN PROGRESS  5.  Pt will improve gait velocity to at least 2.3 ft/sec for improved gait efficiency and safety.  Baseline: 1.97 ft/sec Goal status: IN PROGRESS   ASSESSMENT:  CLINICAL IMPRESSION: Skilled PT session today continues to focus on balance and functional strengthening.  Utilized varied surfaces (incline, foam surfaces) for balance activities, with pt using at least 1 UE support throughout.  With standing stepping and weightshifting on foam surfaces, pt overall has good foot clearance.  He does continue to have some posterior bias  at times, with cues to use hip/ankle strategy for improved balance. Patient continues to benefit from skilled PT towards goals for improved balance and functional mobility, decreased fall risk.  OBJECTIVE IMPAIRMENTS: Abnormal gait, decreased balance, decreased knowledge of use of DME, decreased mobility, difficulty walking, decreased strength, and impaired tone.   ACTIVITY LIMITATIONS: standing, squatting,  transfers, dressing, locomotion level, and caring for others  PARTICIPATION LIMITATIONS: cleaning, laundry, and community activity  PERSONAL FACTORS: 3+ comorbidities: See above PMH  are also affecting patient's functional outcome.   REHAB POTENTIAL: Good  CLINICAL DECISION MAKING: Evolving/moderate complexity  EVALUATION COMPLEXITY: Moderate  PLAN:  PT FREQUENCY: 2x/week  PT DURATION: 8 weeks plus eval visit (9 weeks total POC)  PLANNED INTERVENTIONS: Therapeutic exercises, Therapeutic activity, Neuromuscular re-education, Balance training, Gait training, Patient/Family education, Self Care, Stair training, and DME instructions  PLAN FOR NEXT SESSION: Continue to work on ankle, hip, step strategies for balance; gait training with cane versus walker.  Consider [employer] for exercise, educate in Parkinson's community resources   Mady Haagensen, [place] 01/04/22 9:25 AM Phone: [phone] Fax: [phone]   [place]  [facility] [address], [place], [place] [address] Phone # [phone] Fax # [phone]

## 2022-01-07 ENCOUNTER — Encounter: Payer: Self-pay | Admitting: Physical Therapy

## 2022-01-07 ENCOUNTER — Ambulatory Visit: Payer: PPO | Admitting: Physical Therapy

## 2022-01-07 DIAGNOSIS — M6281 Muscle weakness (generalized): Secondary | ICD-10-CM

## 2022-01-07 DIAGNOSIS — R2681 Unsteadiness on feet: Secondary | ICD-10-CM

## 2022-01-07 DIAGNOSIS — R2689 Other abnormalities of gait and mobility: Secondary | ICD-10-CM

## 2022-01-09 ENCOUNTER — Encounter: Payer: Self-pay | Admitting: Physical Therapy

## 2022-01-09 ENCOUNTER — Ambulatory Visit: Payer: PPO | Admitting: Physical Therapy

## 2022-01-09 DIAGNOSIS — R2681 Unsteadiness on feet: Secondary | ICD-10-CM | POA: Diagnosis not present

## 2022-01-09 DIAGNOSIS — M6281 Muscle weakness (generalized): Secondary | ICD-10-CM

## 2022-01-09 DIAGNOSIS — R2689 Other abnormalities of gait and mobility: Secondary | ICD-10-CM

## 2022-01-09 NOTE — Therapy (Signed)
OUTPATIENT PHYSICAL THERAPY NEURO TREATMENT NOTES   Patient Name: Anthony [name] MRN: [id number] DOB:Aug 08, 1945, 76 y.o., male Today's Date: 01/09/2022   PCP: Domenick Gong MD REFERRING PROVIDER: Larey Seat, MD  END OF SESSION:  PT End of Session - 01/09/22 1344     Visit Number 6    Number of Visits 17    Date for PT Re-Evaluation 02/15/22    Authorization Type [employer]    PT Start Time 1352    PT Stop Time 1435    PT Time Calculation (min) 43 min    Equipment Utilized During Treatment Gait belt    Activity Tolerance Patient tolerated treatment well    Behavior During Therapy WFL for tasks assessed/performed               Past Medical History:  Diagnosis Date   BPH (benign prostatic hyperplasia)    Diabetes mellitus type 2, controlled ([place])    Fatty liver    GERD (gastroesophageal reflux disease)    Gout    last flare up last week right ankle    Heart disease    HTN (hypertension)    Hyperlipidemia    Hypothyroidism    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   Obesity (BMI 30.0-34.9) 05/14/2013   OSA on CPAP    cpap setting of 10/ 13   Pancreatitis dx march 2016   Pneumonia 12 years ago   Past Surgical History:  Procedure Laterality Date   BACK SURGERY  10/2009   Cervical, arterior   CARPAL TUNNEL RELEASE Left 2003   CARPAL TUNNEL RELEASE Bilateral    CATARACT EXTRACTION Bilateral 01/2012   CORONARY BALLOON ANGIOPLASTY N/A 08/28/2021   Procedure: CORONARY BALLOON ANGIOPLASTY;  Surgeon: Nigel Mormon, MD;  Location: [facility];  Service: Cardiovascular;  Laterality: N/A;   EUS N/A 07/15/2014   Procedure: FULL UPPER ENDOSCOPIC ULTRASOUND (EUS) RADIAL;  Surgeon: Carol Ada, MD;  Location: [facility];  Service: Endoscopy;  Laterality: N/A;   LEFT HEART CATH AND CORONARY ANGIOGRAPHY N/A 05/18/2019   Procedure: LEFT HEART CATH AND CORONARY ANGIOGRAPHY;  Surgeon: Adrian Prows, MD;  Location: [facility];  Service:  Cardiovascular;  Laterality: N/A;   LEFT HEART CATH AND CORONARY ANGIOGRAPHY N/A 08/28/2021   Procedure: LEFT HEART CATH AND CORONARY ANGIOGRAPHY;  Surgeon: Nigel Mormon, MD;  Location: [facility];  Service: Cardiovascular;  Laterality: N/A;   NASAL SINUS SURGERY  1981   RETINAL Bilateral 06/2013   Retinal peel   SHOULDER SURGERY Right 2003   Patient Active Problem List   Diagnosis Date Noted   Parkinsonism 12/06/2021   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/2021   Hyperkalemia 12/06/2020   Coronary artery disease of native artery of native heart with stable angina pectoris ([place]) 12/03/2019   Esophageal dysphagia 11/21/2019   Gastroesophageal reflux disease 11/21/2019   Family history of colon cancer 11/21/2019   Abnormal stress test 05/11/2019   Mixed hyperlipidemia 05/11/2019   Angina pectoris ([place]) 08/07/2018   Essential hypertension 08/07/2018   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, with long-term current use of insulin ([place]) 08/07/2018   Morbid obesity ([place]) 08/28/2017   Neuropathy 08/28/2017   Numbness and tingling of both legs below knees 11/10/2013   OSA on CPAP 05/14/2013   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   Obesity (BMI 30.0-34.9) 05/14/2013    ONSET DATE: 12/06/2021  REFERRING DIAG:  G47.33 (ICD-10-CM) - OSA on CPAP  G20.C (ICD-10-CM) - Parkinsonism, unspecified Parkinsonism type  E11.3391,Z79.4 (ICD-10-CM) - 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    Pt accompanied by: self  PERTINENT HISTORY: The patient has a history of diabetes mellitus type 2 diagnosed in 2007, hyperglycemia, basal insulin was started 2019, he has a history of hypertension angina pectoris and has followed with Dr. Virgina Jock his cardiologist.  Hyperlipidemia hypothyroidism noted in 2008 history of gout and benign prostate hyperplasia, pancreatitis bout in March 2016.  COVID in July 2022. Eye vision loss, 2020, right eye  DM related or stroke.    PAIN:  Are you having pain? Yes: NPRS scale: 8/10 Pain location: low back Pain description: sore, sciatica type pain Aggravating factors: too much walking yesterday (with shopping, carrying packages) Relieving factors: heating pad    PRECAUTIONS: Fall  WEIGHT BEARING RESTRICTIONS: No  FALLS: Has patient fallen in last 6 months? No  LIVING ENVIRONMENT: Lives with: lives with their spouse Lives in: House/apartment Stairs: Yes: External: 2-4 steps; on right going up Has following equipment at home:  none  PLOF: Independent  PATIENT GOALS: Pt's goals for therapy are to improve balance and strength  OBJECTIVE:     TODAY'S TREATMENT: 01/09/2022 Activity Comments  5x sit<>stand:  14.16 sec One episode of posterior LOB  TUG:  12.68 sec Attempted TUG cognitive:  unable to continue counting   17M walk:  3.02 ft/sec Improved from 1.97 ft/sec  Attempted PWR! Moves in standing: -PWR! Up x 10 -PWR! Rock x 5 reps Discontinued due to increase in sciatica type pain  Forward/back walking at parallel bars, 5 reps   Sidestepping at parallel bars, 5 reps Cues for foot clearance  Quarter turns, 90 degrees, R and L x 5 reps; then 180 degrees, 5 reps Minor LOB with quick turns to R  Short distance gait with turns, figure-8 turns, with min guard Occasional  decreased foot clearance, instability with turns.   PATIENT EDUCATION: Education details: Parkinson's community/local resources, potential benefits of occupational therapy Person educated: Patient Education method:  Explanation and Handouts Education comprehension: verbalized understanding      HOME EXERCISE PROGRAM Last updated: 01/02/22 Access Code: [email or web address] URL: [email or web address] Date: 01/02/2022 Prepared by: [facility]  Exercises - Heel Toe Raises with Counter Support  - 1 x daily - 7 x weekly - 2 sets - 10 reps - Side to side weightshift  - 1 x daily - 7 x weekly - 2 sets - 10 reps - Side Stepping with Counter Support  - 1 x daily - 5 x weekly - 2 sets - 10 reps - Staggered Stance Weight Shift with Arms Reaching  - 1 x daily - 5 x weekly - 2 sets - 10 reps - Standing Anterior Posterior Weight Shift with Chair  - 1 x daily - 5 x weekly - 2 sets - 10 reps - Alternating Step Forward with Support  - 1 x daily - 5 x weekly - 2 sets - 10 reps      --------------------------------------------------------------------------------- Objective measures below taken at initial evaluation: DIAGNOSTIC FINDINGS: DaT Scan 09/2021: Ioflupane scan favored within normal limits. No reduced radiotracer activity in basal ganglia to suggest Parkinson's syndrome pathology.  COGNITION: Overall cognitive status: Within functional limits for tasks assessed   SENSATION: Light touch: Impaired Did not feel light tough at feet bilaterally  LOWER EXTREMITY ROM:   WFL BLEs And MMT  MMT Right Eval Left Eval  Hip flexion 4+ 4  Hip extension    Hip abduction 4 4  Hip adduction 4 4  Hip internal rotation    Hip external rotation    Knee flexion 4+ 4  Knee extension 4+ 4  Ankle dorsiflexion 5 3+  Ankle plantarflexion    Ankle inversion    Ankle eversion     (Blank rows = not tested)   With PROM, BLEs, increased tone, mild resistance to  PROM  TRANSFERS: Assistive device utilized: None  Sit to stand: SBA Stand to sit: SBA GAIT: Gait pattern: step through pattern, decreased stride length, and narrow BOS Distance walked: 50 ft x 4 Assistive device utilized:  trial of single walking pole, SPC and None Level of assistance: SBA Comments: Unsteadiness noted with turns  FUNCTIONAL TESTS:  5 times sit to stand: 19.34 sec Timed up and go (TUG): 14.62 sec TUG cognitive: 19 sec (1 LOB during turn) MiniBESTest: 8/28 (Scores <22/28 indicate increased fall risk) Gait velocity:  16.63 sec:  1.97 ft/sec     TODAY'S TREATMENT:                                                                                                                              DATE: 12/20/2021    PATIENT EDUCATION: Education details: PT eval results, POC, intiated HEP Person educated: Patient Education method: Explanation, Demonstration, and Handouts Education comprehension: verbalized understanding, returned demonstration, and needs further education  HOME EXERCISE PROGRAM: Access Code: [email or web address] URL: [email or web address] Date: 12/20/2021 Prepared by:  [facility]  Exercises - Heel Toe Raises with Counter Support  - 1 x daily - 7 x weekly - 2 sets - 10 reps - Side to side weightshift  - 1 x daily - 7 x weekly - 2 sets - 10 reps  GOALS: Goals reviewed with patient? Yes  SHORT TERM GOALS: Target date: 01/18/2022  Pt will be independent with HEP for improved balance, strength, gait. Baseline: Goal status: IN PROGRESS  2.  Pt will improve 5x sit<>stand to less than or equal to 15 sec to demonstrate improved functional strength and transfer efficiency. Baseline: 19.34 sec at eval:  14.16 sec 01/09/2022 Goal status: GOAL MET  3.  Pt will improve TUG score to less than or equal to 13.5 sec for decreased fall risk. Baseline: 12.68 sec 01/09/2022 Goal status: GOAL MET  4.  Pt will verbalize  understanding of local Parkinson's disease related resources. Baseline: info provided 01/09/2022 Goal status: IN PROGRESS   LONG TERM GOALS: Target date: 02/15/2022  Pt will be independent with HEP for improved balance, gait, strength. Baseline:  Goal status: IN PROGRESS  2.  Pt will improve 5x sit<>stand to less than or equal to 13 sec to demonstrate improved functional strength and transfer efficiency.  Baseline: 19.34 sec Goal status: IN PROGRESS  3.  Pt will improve TUG/TUG cognitive score to less than or equal to 10% difference sec for decreased fall risk.  Baseline: 1462, 19 sec Goal status: IN PROGRESS  4.  Pt will improve MiniBESTest score to at least 14/28 to decrease fall risk. Baseline: 8/28 Goal status: IN PROGRESS  5.  Pt will improve gait velocity to at least 2.3 ft/sec for improved gait efficiency and safety.  Baseline: 1.97 ft/sec> 3 ft/sec 01/09/2022 Goal status: GOAL MET   ASSESSMENT:  CLINICAL IMPRESSION: Began looking at STG measures today, with improvements made in TUG, 5x sit<>stand, and gait velocity scores.  He has met STG 2, 3.  LTG 5 met.  Initiated Teaching laboratory technician.  Will further assess STGs next week.  Pt appears to be improving with short distance gait, with functional strength and gait velocity.  He does continue to have difficulty with turning stability with gait.  He will continue to benefit from skilled PT towards goals for improved functional mobility and decreased fall risk.  OBJECTIVE IMPAIRMENTS: Abnormal gait, decreased balance, decreased knowledge of use of DME, decreased mobility, difficulty walking, decreased strength, and impaired tone.   ACTIVITY LIMITATIONS: standing, squatting, transfers, dressing, locomotion level, and caring for others  PARTICIPATION LIMITATIONS: cleaning, laundry, and community activity  PERSONAL FACTORS: 3+ comorbidities: See above PMH  are also affecting patient's functional outcome.    REHAB POTENTIAL: Good  CLINICAL DECISION MAKING: Evolving/moderate complexity  EVALUATION COMPLEXITY: Moderate  PLAN:  PT FREQUENCY: 2x/week  PT DURATION: 8 weeks plus eval visit (9 weeks total POC)  PLANNED INTERVENTIONS: Therapeutic exercises, Therapeutic activity, Neuromuscular re-education, Balance training, Gait training, Patient/Family education, Self Care, Stair training, and DME instructions  PLAN FOR NEXT SESSION: Utilize Nustep and resisted gait; check HEP and remaining STGs; look at [place] and decide about further appointments after next week.     Mady Haagensen, PT 01/09/22 2:41 PM Phone: [phone] Fax: [phone]   [facility] at [facility] [address], [place], [place] [address] Phone # [phone] Fax # [phone]

## 2022-01-15 ENCOUNTER — Encounter: Payer: Self-pay | Admitting: Physical Therapy

## 2022-01-15 ENCOUNTER — Ambulatory Visit: Payer: PPO | Admitting: Physical Therapy

## 2022-01-15 DIAGNOSIS — R2681 Unsteadiness on feet: Secondary | ICD-10-CM

## 2022-01-15 DIAGNOSIS — R2689 Other abnormalities of gait and mobility: Secondary | ICD-10-CM

## 2022-01-15 DIAGNOSIS — M6281 Muscle weakness (generalized): Secondary | ICD-10-CM

## 2022-01-15 NOTE — Therapy (Signed)
OUTPATIENT PHYSICAL THERAPY NEURO TREATMENT NOTE/DISCHARGE SUMMARY   Patient Name: Anthony [name] MRN: [id number] DOB:1945/03/04, 76 y.o., male Today's Date: 01/15/2022   PCP: Domenick Gong MD REFERRING PROVIDER: Larey Seat, MD   PHYSICAL THERAPY DISCHARGE SUMMARY  Visits from Start of Care: 7  Current functional level related to goals / functional outcomes: Pt has met 4 of 4 STGs, and he has met 4 of 5 LTGs.  See below   Remaining deficits: Balance with vision removed, compliant surface balance; at fall risk, but measures have improved   Education / Equipment: Educated in HEP   Patient agrees to discharge. Patient goals were met. Patient is being discharged due to being pleased with the current functional level.  He requests to discharge this visit, due to needing to help his wife with some upcoming medical needs.  He agrees to return screen recommendation for 6-9 months.  Mady Haagensen, PT 01/15/22 2:52 PM Phone: [phone] Fax: [phone]   END OF SESSION:  PT End of Session - 01/15/22 1402     Visit Number 7    Number of Visits 17    Date for PT Re-Evaluation 02/15/22    Authorization Type [employer]    PT Start Time 1404    PT Stop Time 1444    PT Time Calculation (min) 40 min    Equipment Utilized During Treatment --    Activity Tolerance Patient tolerated treatment well    Behavior During Therapy WFL for tasks assessed/performed               Past Medical History:  Diagnosis Date   BPH (benign prostatic hyperplasia)    Diabetes mellitus type 2, controlled ([place])    Fatty liver    GERD (gastroesophageal reflux disease)    Gout    last flare up last week right ankle    Heart disease    HTN (hypertension)    Hyperlipidemia    Hypothyroidism    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   Obesity (BMI 30.0-34.9) 05/14/2013   OSA on CPAP    cpap setting of 10/ 13   Pancreatitis dx march 2016   Pneumonia 12 years ago   Past  Surgical History:  Procedure Laterality Date   BACK SURGERY  10/2009   Cervical, arterior   CARPAL TUNNEL RELEASE Left 2003   CARPAL TUNNEL RELEASE Bilateral    CATARACT EXTRACTION Bilateral 01/2012   CORONARY BALLOON ANGIOPLASTY N/A 08/28/2021   Procedure: CORONARY BALLOON ANGIOPLASTY;  Surgeon: Nigel Mormon, MD;  Location: [facility] [facility] [facility];  Service: Cardiovascular;  Laterality: N/A;   EUS N/A 07/15/2014   Procedure: FULL UPPER ENDOSCOPIC ULTRASOUND (EUS) RADIAL;  Surgeon: Carol Ada, MD;  Location: [facility];  Service: Endoscopy;  Laterality: N/A;   LEFT HEART CATH AND CORONARY ANGIOGRAPHY N/A 05/18/2019   Procedure: LEFT HEART CATH AND CORONARY ANGIOGRAPHY;  Surgeon: Adrian Prows, MD;  Location: [place] [facility];  Service: Cardiovascular;  Laterality: N/A;   LEFT HEART CATH AND CORONARY ANGIOGRAPHY N/A 08/28/2021   Procedure: LEFT HEART CATH AND CORONARY ANGIOGRAPHY;  Surgeon: Nigel Mormon, MD;  Location: [facility];  Service: Cardiovascular;  Laterality: N/A;   NASAL SINUS SURGERY  1981   RETINAL Bilateral 06/2013   Retinal peel   SHOULDER SURGERY Right 2003   Patient Active Problem List   Diagnosis Date Noted   Parkinsonism 12/06/2021   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[place]) 12/06/2021   Hyperkalemia 12/06/2020   Coronary artery  disease of native artery of native heart with stable angina pectoris ([place]) 12/03/2019   Esophageal dysphagia 11/21/2019   Gastroesophageal reflux disease 11/21/2019   Family history of colon cancer 11/21/2019   Abnormal stress test 05/11/2019   Mixed hyperlipidemia 05/11/2019   Angina pectoris ([place]) 08/07/2018   Essential hypertension 08/07/2018   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, with long-term current use of insulin ([place]) 08/07/2018   Morbid obesity ([place]) 08/28/2017   Neuropathy 08/28/2017   Numbness and tingling of both legs below knees 11/10/2013   OSA on CPAP  05/14/2013   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   Obesity (BMI 30.0-34.9) 05/14/2013    ONSET DATE: 12/06/2021  REFERRING DIAG:  G47.33 (ICD-10-CM) - OSA on CPAP  G20.C (ICD-10-CM) - Parkinsonism, unspecified Parkinsonism type  E11.3391,Z79.4 (ICD-10-CM) - 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  Back is better.  Would like to discharge today, as my wife has multiple medical appointments and I need to take care of her. Pt accompanied by: self  PERTINENT HISTORY: The patient has a history of diabetes mellitus type 2 diagnosed in 2007, hyperglycemia, basal insulin was started 2019, he has a history of hypertension angina pectoris and has followed with Dr. Virgina Jock his cardiologist.  Hyperlipidemia hypothyroidism noted in 2008 history of gout and benign prostate hyperplasia, pancreatitis bout in March 2016.  COVID in July 2022. Eye vision loss, 2020, right eye  DM related or stroke.    PAIN:  Are you having pain? No  PRECAUTIONS: Fall  WEIGHT BEARING RESTRICTIONS: No  FALLS: Has patient fallen in last 6 months? No  LIVING ENVIRONMENT: Lives with: lives with their spouse Lives in: House/apartment Stairs: Yes: External: 2-4 steps; on right going up Has following equipment at home:  none  PLOF: Independent  PATIENT GOALS: Pt's goals for therapy are to improve balance and  strength  OBJECTIVE:    TODAY'S TREATMENT: 01/15/2022 Activity Comments  Full review of HEP-see below Pt return demo understanding  12M walk:  10.59 sec = 3.1  ft/sec Improved from 1.97 ft/sec  5x sit<>stand:  12.84 sec Improved from 14.16 sec  TUG:12.37 sec TUG cognitive:  14.54 sec (miscounts)   MiniBESTest:  16/28 Improved from 8/28  Gait training 75 ft x 2 with cane Initial cues for sequence      [facility] Adult PT Treatment/Exercise - 01/15/22 0001       Mini-BESTest   Sit To Stand Normal: Comes to stand without use of hands and stabilizes independently.    Rise to Toes Normal: Stable for 3 s with maximum height.    Stand on one leg (left) Moderate: < 20 s   3.07, 1.88 sec   Stand on one leg (right) Moderate: < 20 s   1.34, 1.36 sec   Stand on one leg - lowest score 1    Compensatory Stepping Correction - Forward Moderate: More than one step is required to recover equilibrium    Compensatory Stepping Correction - Backward Moderate: More than one step is required to recover equilibrium    Compensatory Stepping Correction - Left Lateral Severe: Falls, or cannot step    Compensatory Stepping Correction - Right Lateral Severe:  Falls, or cannot step    Stepping Corredtion Lateral - lowest score 0    Stance - Feet together, eyes open, firm surface  Normal: 30s    Stance - Feet together, eyes closed, foam surface  Severe: Unable    Incline - Eyes Closed Moderate: Stands independently < 30s OR aligns with surface   13.06   Change in Gait Speed Normal: Significantly changes walkling speed without imbalance    Walk with head turns - Horizontal Moderate: performs head turns with reduction in gait speed.    Walk with pivot turns Moderate:Turns with feet close SLOW (>4 steps) with good balance.    Step over obstacles Moderate: Steps over box but touches box OR displays cautious behavior by slowing gait.    Timed UP & GO with Dual Task Moderate: Dual Task affects either counting OR walking  (>10%) when compared to the TUG without Dual Task.    Mini-BEST total score 16             PATIENT EDUCATION: Education details: Progress toward goals, recommendation for cane use for longer distance, outdoor surfaces; plan for discharge today per patient request with recommendation for return PT screen in 6-9 months. Person educated: Patient Education method: Explanation Education comprehension: verbalized understanding    HOME EXERCISE PROGRAM Last updated: 01/02/22 Access Code: [email or web address] URL: [email or web address] Date: 01/02/2022 Prepared by: [facility]  Exercises - Heel Toe Raises with Counter Support  - 1 x daily - 7 x weekly - 2 sets - 10 reps - Side to side weightshift  - 1 x daily - 7 x weekly - 2 sets - 10 reps - Side Stepping with Counter Support  - 1 x daily - 5 x weekly - 2 sets - 10 reps - Staggered Stance Weight Shift with Arms Reaching  - 1 x daily - 5 x weekly - 2 sets - 10 reps - Standing Anterior Posterior Weight Shift with Chair  - 1 x daily - 5 x weekly - 2 sets - 10 reps - Alternating Step Forward with Support  - 1 x daily - 5 x weekly - 2 sets - 10 reps      --------------------------------------------------------------------------------- Objective measures below taken at initial evaluation: DIAGNOSTIC FINDINGS: DaT Scan 09/2021: Ioflupane scan favored within normal limits. No  reduced radiotracer activity in basal ganglia to suggest Parkinson's syndrome pathology.  COGNITION: Overall cognitive status: Within functional limits for tasks assessed   SENSATION: Light touch: Impaired Did not feel light tough at feet bilaterally  LOWER EXTREMITY ROM:   WFL BLEs And MMT  MMT Right Eval Left Eval  Hip flexion 4+ 4  Hip extension    Hip abduction 4 4  Hip adduction 4 4  Hip internal rotation    Hip external rotation    Knee flexion 4+ 4  Knee extension 4+ 4  Ankle dorsiflexion 5 3+  Ankle  plantarflexion    Ankle inversion    Ankle eversion     (Blank rows = not tested)   With PROM, BLEs, increased tone, mild resistance to PROM  TRANSFERS: Assistive device utilized: None  Sit to stand: SBA Stand to sit: SBA GAIT: Gait pattern: step through pattern, decreased stride length, and narrow BOS Distance walked: 50 ft x 4 Assistive device utilized:  trial of single walking pole, SPC and None Level of assistance: SBA Comments: Unsteadiness noted with turns  FUNCTIONAL TESTS:  5 times sit to stand: 19.34 sec Timed up and go (TUG): 14.62 sec TUG cognitive: 19 sec (1 LOB during turn) MiniBESTest: 8/28 (Scores <22/28 indicate increased fall risk) Gait velocity:  16.63 sec:  1.97 ft/sec     TODAY'S TREATMENT:                                                                                                                              DATE: 12/20/2021    PATIENT EDUCATION: Education details: PT eval results, POC, intiated HEP Person educated: Patient Education method: Explanation, Demonstration, and Handouts Education comprehension: verbalized understanding, returned demonstration, and needs further education  HOME EXERCISE PROGRAM: Access Code: [email or web address] URL: [email or web address] Date: 12/20/2021 Prepared by: [facility]  Exercises - Heel Toe Raises with Counter Support  - 1 x daily - 7 x weekly - 2 sets - 10 reps - Side to side weightshift  - 1 x daily - 7 x weekly - 2 sets - 10 reps  GOALS: Goals reviewed with patient? Yes  SHORT TERM GOALS: Target date: 01/18/2022  Pt will be independent with HEP for improved balance, strength, gait. Baseline: Goal status: GOAL MET, 01/15/2022  2.  Pt will improve 5x sit<>stand to less than or equal to 15 sec to demonstrate improved functional strength and transfer efficiency. Baseline: 19.34 sec at eval:  14.16 sec 01/09/2022 Goal status: GOAL MET  3.  Pt will improve  TUG score to less than or equal to 13.5 sec for decreased fall risk. Baseline: 12.68 sec 01/09/2022 Goal status: GOAL MET  4.  Pt will verbalize understanding of local Parkinson's disease related resources. Baseline: info provided 01/09/2022 Goal status:GOAL MET, 01/15/2022   LONG TERM GOALS: Target date: 02/15/2022  Pt will be independent with HEP for improved balance, gait, strength. Baseline:  Goal status: GOAL MET, 01/15/2022  2.  Pt will improve 5x sit<>stand to less than or equal to 13 sec to demonstrate improved functional strength and transfer efficiency.  Baseline: 19.34 sec>12.84 sec 01/15/2022 Goal status: GOAL MET  3.  Pt will improve TUG/TUG cognitive score to less than or equal to 10% difference sec for decreased fall risk.  Baseline: 14.62, 19 sec>12.3, 14.5 sec 01/16/2023 Goal status: GOAL PARTIALLY MET  4.  Pt will improve MiniBESTest score to at least 14/28 to decrease fall risk. Baseline: 8/28>16/2812/26/2024 Goal status: GOAL MET  5.  Pt will improve gait velocity to at least 2.3 ft/sec for improved gait efficiency and safety.  Baseline: 1.97 ft/sec> 3 ft/sec 01/09/2022 Goal status: GOAL MET   ASSESSMENT:  CLINICAL IMPRESSION: Pt presents to OPPT today, reporting he would like to discharge today due to upcoming medical appointments for his wife.  He feels like balance has improved and he verbalizes plans to continue HEP upon d/c.  Assessed remaining goals today.  Pt has met all 4 of 4 STGs.  He has met 4 of 5 LTGs.  He has met LTG 1 and 2, 4 and 5 (improved 5x sit<>stand, MiniBESTest, and gait velocity scores) and he has partially met LTG 3 for improved TUG/TUG cognitive scores, just not to goal level.  Pt does remain at increased fall risk per MiniBESTest score and PT recommends cane use due to pt's extensive neuropathy and difficulty with balance on unlevel surfaces and with vision removed.  He is appropriate for d/c this visit and agrees to return PT screen  in 6-9 months due to progressive nature of disease.  OBJECTIVE IMPAIRMENTS: Abnormal gait, decreased balance, decreased knowledge of use of DME, decreased mobility, difficulty walking, decreased strength, and impaired tone.   ACTIVITY LIMITATIONS: standing, squatting, transfers, dressing, locomotion level, and caring for others  PARTICIPATION LIMITATIONS: cleaning, laundry, and community activity  PERSONAL FACTORS: 3+ comorbidities: See above PMH  are also affecting patient's functional outcome.   REHAB POTENTIAL: Good  CLINICAL DECISION MAKING: Evolving/moderate complexity  EVALUATION COMPLEXITY: Moderate  PLAN:  PT FREQUENCY: 2x/week  PT DURATION: 8 weeks plus eval visit (9 weeks total POC)  PLANNED INTERVENTIONS: Therapeutic exercises, Therapeutic activity, Neuromuscular re-education, Balance training, Gait training, Patient/Family education, Self Care, Stair training, and DME instructions  PLAN FOR NEXT SESSION: Discharge PT this visit; return PT screen in 6-9 months.    Mady Haagensen, PT 01/15/22 2:49 PM Phone: [phone] Fax: [phone]   [facility] [place], [place], [place] [address] Phone # [phone] Fax # [phone]

## 2022-01-17 ENCOUNTER — Ambulatory Visit: Payer: HMO | Admitting: Cardiology

## 2022-01-17 ENCOUNTER — Ambulatory Visit: Payer: PPO | Admitting: Physical Therapy

## 2022-01-21 HISTORY — PX: COLONOSCOPY: SHX174

## 2022-01-25 DIAGNOSIS — M25512 Pain in left shoulder: Secondary | ICD-10-CM | POA: Diagnosis not present

## 2022-01-25 DIAGNOSIS — I119 Hypertensive heart disease without heart failure: Secondary | ICD-10-CM | POA: Diagnosis not present

## 2022-01-28 ENCOUNTER — Ambulatory Visit: Payer: PPO | Admitting: Cardiology

## 2022-01-28 ENCOUNTER — Encounter: Payer: Self-pay | Admitting: Cardiology

## 2022-01-28 VITALS — BP 115/67 | HR 62 | Resp 16 | Ht 70.0 in | Wt 236.8 lb

## 2022-01-28 DIAGNOSIS — I10 Essential (primary) hypertension: Secondary | ICD-10-CM | POA: Diagnosis not present

## 2022-01-28 DIAGNOSIS — I25118 Atherosclerotic heart disease of native coronary artery with other forms of angina pectoris: Secondary | ICD-10-CM | POA: Diagnosis not present

## 2022-01-28 NOTE — Progress Notes (Signed)
Patient referred by Haywood Pao, MD for chest pain  Subjective:   Anthony [name], male    DOB: 25-Dec-1945, 77 y.o.   MRN: [id number]   Chief Complaint  Patient presents with   Coronary Artery Disease   Hypertension   Follow-up    6 months   77 y.o. Caucasian male with hypertension, hyperlipidemia, CAD, type 2 DM, OSA on CPAP, hypothyroidism, GERD  Patient denies chest pain, shortness of breath, palpitations, leg edema, orthopnea, PND, TIA/syncope. He underwent intra articular injections for neck and shoulder pain by Dr. Osborne Casco.    Current Outpatient Medications:    acetaminophen (TYLENOL) 500 MG tablet, Take 500 mg by mouth every 6 (six) hours as needed for moderate pain., Disp: , Rfl:    allopurinol (ZYLOPRIM) 300 MG tablet, Take 300 mg by mouth daily. , Disp: , Rfl:    Alogliptin Benzoate 25 MG TABS, Take 25 mg by mouth daily., Disp: , Rfl:    amLODipine (NORVASC) 2.5 MG tablet, Take 2.5 mg by mouth daily., Disp: , Rfl:    Ascorbic Acid (VITAMIN C) 1000 MG tablet, Take 1,000 mg by mouth daily., Disp: , Rfl:    aspirin 81 MG EC tablet, TAKE 1 TABLET (81 MG TOTAL) BY MOUTH DAILY. SWALLOW WHOLE., Disp: 90 tablet, Rfl: 3   Capsaicin 0.05 % CREA, Apply 1 application  topically at bedtime. Apply to feet, Disp: , Rfl:    carbidopa-levodopa (SINEMET IR) 25-100 MG tablet, Take 1 tablet by mouth 3 (three) times daily., Disp: 90 tablet, Rfl: 5   carboxymethylcellulose (REFRESH PLUS) 0.5 % SOLN, Place 1 drop into both eyes 3 (three) times daily as needed (dry eyes)., Disp: , Rfl:    clopidogrel (PLAVIX) 75 MG tablet, Take 1 tablet (75 mg total) by mouth daily with breakfast., Disp: 30 tablet, Rfl: 1   colchicine 0.6 MG tablet, Take 0.6 mg by mouth daily as needed (for gout)., Disp: , Rfl:    cyclobenzaprine (FLEXERIL) 5 MG tablet, Take 1 tablet (5 mg total) by mouth 2 (two) times daily as needed for muscle spasms., Disp: 10 tablet, Rfl: 0   divalproex (DEPAKOTE) 250 MG DR tablet,  Take 250 mg by mouth at bedtime., Disp: , Rfl:    DULoxetine (CYMBALTA) 30 MG capsule, Take 30 mg by mouth daily., Disp: , Rfl:    empagliflozin (JARDIANCE) 10 MG TABS tablet, Take 10 mg by mouth daily. , Disp: , Rfl:    furosemide (LASIX) 40 MG tablet, Take 40 mg by mouth daily., Disp: , Rfl:    gabapentin (NEURONTIN) 100 MG capsule, Take 200 mg by mouth at bedtime. Take with 600 mg tablet, Disp: , Rfl:    gabapentin (NEURONTIN) 600 MG tablet, Take 600 mg by mouth 3 (three) times daily., Disp: , Rfl:    insulin glargine-yfgn (SEMGLEE) 100 UNIT/ML Pen, Inject 50 Units into the skin at bedtime., Disp: , Rfl:    isosorbide mononitrate (IMDUR) 60 MG 24 hr tablet, TAKE 1 TABLET BY MOUTH EVERY DAY, Disp: 90 tablet, Rfl: 3   ketoconazole (NIZORAL) 2 % cream, Apply 1 application topically daily as needed for irritation (yeast irritation). , Disp: , Rfl:    lisinopril (ZESTRIL) 40 MG tablet, Take 40 mg by mouth daily., Disp: , Rfl:    metoprolol succinate (TOPROL-XL) 50 MG 24 hr tablet, TAKE 1 TABLET BY MOUTH EVERY DAY WITH OR IMMEDIATELY FOLLOWING A MEAL, Disp: 90 tablet, Rfl: 3   metroNIDAZOLE (METROGEL) 0.75 % gel, Apply  1 application topically daily as needed (Skin rash on face). , Disp: , Rfl:    mirabegron ER (MYRBETRIQ) 50 MG TB24 tablet, Take 50 mg by mouth daily., Disp: , Rfl:    nitroGLYCERIN (NITROSTAT) 0.4 MG SL tablet, Place 0.4 mg under the tongue every 5 (five) minutes as needed for chest pain., Disp: , Rfl:    OVER THE COUNTER MEDICATION, Apply 1 application  topically daily as needed (Leg cramps). Thera Worx, Disp: , Rfl:    oxybutynin (DITROPAN) 5 MG tablet, Take 5 mg by mouth at bedtime., Disp: , Rfl:    pantoprazole (PROTONIX) 40 MG tablet, Take protonix 40 mg twice daily for 2 months.  Resume once per day after that. (Patient taking differently: Take 40 mg by mouth 2 (two) times daily before a meal.), Disp: 60 tablet, Rfl: 0   polyethylene glycol (MIRALAX / GLYCOLAX) 17 g packet, Take  17 g by mouth daily as needed for mild constipation or moderate constipation., Disp: , Rfl:    pyridOXINE (VITAMIN B-6) 100 MG tablet, Take 100 mg by mouth daily., Disp: , Rfl:    rosuvastatin (CRESTOR) 40 MG tablet, Take 1 tablet (40 mg total) by mouth daily., Disp: 90 tablet, Rfl: 3   SYNTHROID 25 MCG tablet, Take 25 mcg by mouth daily before breakfast., Disp: , Rfl:    tamsulosin (FLOMAX) 0.4 MG CAPS capsule, Take 0.4 mg by mouth every evening. , Disp: , Rfl:    White Petrolatum-Mineral Oil (REFRESH LACRI-LUBE OP), Place 1 drop into both eyes at bedtime., Disp: , Rfl:   Cardiovascular studies:  EKG 01/28/2022: Sinus rhythm 55 bpm Normal EKG  Lexiscan Tetrofosmin stress test 12/24/2021: Lexiscan nuclear stress test performed using 1-day protocol. Normal myocardial perfusion. Stress LV EF calculated 48%, although visually appears normal. Low risk study. Previous study on 08/01/2021 reported the following stress perfusion abnormalities, that are now absent. Medium size, mild intensity perfusion defect involving the apical septal, apical anterior, and apex.  Small size, mild intensity perfusion defect involving the basal to mid inferolateral segments.    Coronary intervention 08/28/2021: LM: Normal LAD: Prox-mid 50% disease (Unchanged compared to cath 2021)         Distal focal 75% stenosis (New since cath 2021) Lcx: Prox-mid 50% disease (Unchanged compared to cath 2021) Ramus: Prox 50% disease (Unchanged compared to cath 2021) RCA: Prox-distal 30% disease (Unchanged compared to cath 2021) Lcx: Prox-mid 50% disease   Successful PTCA distal LAD  w/2.0X12 mm balloon at 6 atm Excellent results, thus stenting deferred.  Echocardiogram 08/10/2021:  Normal LV systolic function with EF 69%. Left ventricle cavity is normal  in size. Moderate concentric hypertrophy of the left ventricle. Normal  global wall motion. Normal diastolic filling pattern. Calculated EF 69%.  Trileaflet aortic valve. Trace  aortic stenosis. Trace aortic  regurgitation. Mild aortic valve leaflet calcification. Peak velocity    2.107ms, Peak Pressure Gradient  260mg, Mean Gradient 9.78m50m, AVA  1.51cm, Dimensionless Index 0.5  Structurally normal tricuspid valve with trace regurgitation. No evidence  of pulmonary hypertension.  IVC is dilated with respiratory variation. May suggest mild elevation in  CVP.   Recent labs: 09/25/2021: Chol 125, TG 200, HDL 32, LDL 60  08/20/2021: Glucose 134, BUN/Cr 16/1.11. EGFR 69. Na/K 139/4.8.  H/H 15/46. MCV 88. Platelets 254  11/2020: Chol 145, TG 197, HDL 33, LDL 79  Review of Systems  Cardiovascular:  Negative for chest pain, dyspnea on exertion, leg swelling, palpitations and syncope.  Vitals:   01/28/22 1432  BP: 115/67  Pulse: 62  Resp: 16  SpO2: 91%     Body mass index is 33.98 kg/m. Filed Weights   01/28/22 1432  Weight: 236 lb 12.8 oz (107.4 kg)     Objective:   Physical Exam Vitals reviewed.  Constitutional:      Appearance: He is well-developed.  Neck:     Vascular: No JVD.  Cardiovascular:     Rate and Rhythm: Normal rate and regular rhythm.     Pulses: Intact distal pulses.     Heart sounds: Normal heart sounds. No murmur heard. Pulmonary:     Effort: Pulmonary effort is normal.     Breath sounds: Normal breath sounds. No wheezing or rales.  Musculoskeletal:     Right lower leg: No edema.     Left lower leg: No edema.         Assessment & Recommendations:   77 y.o. Caucasian male with hypertension, hyperlipidemia, mod nonobstructive CAD, type 2 DM, OSA on CPAP, hypothyroidism, GERD  CAD: S/p mid LAD PTCA (08/2021). Recommend DAPT with Aspirin and plavix till 04/2022 No ischemia on stress testing (01/2021) He is unsure if he is taking plavix currently. If he is not, reasonable to hold off. If he is, may stop at the end of Feb 2024.   Hypertension: Well controlled.   Type 2 diabetes mellitus without  complication: Continue current management, including Jardiance.  Mixed hyperlipidemia: Chol 125, TG 200, HDL 32, LDL 60 (09/2021) on Crestor 20 mg daily. Continue diet modification.  No changes today.  F/u in 6 months    [employer], PA-C 01/28/2022, 10:54 AM Office: [phone]

## 2022-02-15 DIAGNOSIS — E1165 Type 2 diabetes mellitus with hyperglycemia: Secondary | ICD-10-CM | POA: Diagnosis not present

## 2022-02-21 ENCOUNTER — Other Ambulatory Visit: Payer: Self-pay | Admitting: Adult Health

## 2022-03-18 DIAGNOSIS — E1165 Type 2 diabetes mellitus with hyperglycemia: Secondary | ICD-10-CM | POA: Diagnosis not present

## 2022-03-20 DIAGNOSIS — G4733 Obstructive sleep apnea (adult) (pediatric): Secondary | ICD-10-CM | POA: Diagnosis not present

## 2022-04-16 DIAGNOSIS — E1165 Type 2 diabetes mellitus with hyperglycemia: Secondary | ICD-10-CM | POA: Diagnosis not present

## 2022-04-17 DIAGNOSIS — D692 Other nonthrombocytopenic purpura: Secondary | ICD-10-CM | POA: Diagnosis not present

## 2022-04-17 DIAGNOSIS — K219 Gastro-esophageal reflux disease without esophagitis: Secondary | ICD-10-CM | POA: Diagnosis not present

## 2022-04-17 DIAGNOSIS — Z794 Long term (current) use of insulin: Secondary | ICD-10-CM | POA: Diagnosis not present

## 2022-04-17 DIAGNOSIS — H5462 Unqualified visual loss, left eye, normal vision right eye: Secondary | ICD-10-CM | POA: Diagnosis not present

## 2022-04-17 DIAGNOSIS — R32 Unspecified urinary incontinence: Secondary | ICD-10-CM | POA: Diagnosis not present

## 2022-04-17 DIAGNOSIS — E7800 Pure hypercholesterolemia, unspecified: Secondary | ICD-10-CM | POA: Diagnosis not present

## 2022-04-17 DIAGNOSIS — E669 Obesity, unspecified: Secondary | ICD-10-CM | POA: Diagnosis not present

## 2022-04-17 DIAGNOSIS — M109 Gout, unspecified: Secondary | ICD-10-CM | POA: Diagnosis not present

## 2022-04-17 DIAGNOSIS — F4310 Post-traumatic stress disorder, unspecified: Secondary | ICD-10-CM | POA: Diagnosis not present

## 2022-04-17 DIAGNOSIS — E039 Hypothyroidism, unspecified: Secondary | ICD-10-CM | POA: Diagnosis not present

## 2022-04-17 DIAGNOSIS — E1140 Type 2 diabetes mellitus with diabetic neuropathy, unspecified: Secondary | ICD-10-CM | POA: Diagnosis not present

## 2022-04-17 DIAGNOSIS — G2581 Restless legs syndrome: Secondary | ICD-10-CM | POA: Diagnosis not present

## 2022-05-17 DIAGNOSIS — E1165 Type 2 diabetes mellitus with hyperglycemia: Secondary | ICD-10-CM | POA: Diagnosis not present

## 2022-06-06 ENCOUNTER — Other Ambulatory Visit: Payer: Self-pay | Admitting: Neurological Surgery

## 2022-06-06 DIAGNOSIS — M5412 Radiculopathy, cervical region: Secondary | ICD-10-CM | POA: Diagnosis not present

## 2022-06-06 DIAGNOSIS — M5416 Radiculopathy, lumbar region: Secondary | ICD-10-CM | POA: Diagnosis not present

## 2022-06-12 DIAGNOSIS — J0100 Acute maxillary sinusitis, unspecified: Secondary | ICD-10-CM | POA: Diagnosis not present

## 2022-06-12 DIAGNOSIS — R051 Acute cough: Secondary | ICD-10-CM | POA: Diagnosis not present

## 2022-06-12 DIAGNOSIS — G4733 Obstructive sleep apnea (adult) (pediatric): Secondary | ICD-10-CM | POA: Diagnosis not present

## 2022-06-16 DIAGNOSIS — E1165 Type 2 diabetes mellitus with hyperglycemia: Secondary | ICD-10-CM | POA: Diagnosis not present

## 2022-06-18 DIAGNOSIS — G4733 Obstructive sleep apnea (adult) (pediatric): Secondary | ICD-10-CM | POA: Diagnosis not present

## 2022-06-23 ENCOUNTER — Ambulatory Visit
Admission: RE | Admit: 2022-06-23 | Discharge: 2022-06-23 | Disposition: A | Discharge status: Home / Self Care | Payer: PPO | Source: Ambulatory Visit | Attending: Neurological Surgery | Admitting: Neurological Surgery

## 2022-06-23 DIAGNOSIS — Z981 Arthrodesis status: Secondary | ICD-10-CM | POA: Diagnosis not present

## 2022-06-23 DIAGNOSIS — M5412 Radiculopathy, cervical region: Secondary | ICD-10-CM

## 2022-06-23 DIAGNOSIS — M4712 Other spondylosis with myelopathy, cervical region: Secondary | ICD-10-CM | POA: Diagnosis not present

## 2022-06-26 ENCOUNTER — Encounter: Payer: Self-pay | Admitting: Adult Health

## 2022-06-26 ENCOUNTER — Ambulatory Visit: Payer: PPO | Admitting: Adult Health

## 2022-06-26 VITALS — BP 139/77 | HR 59 | Ht 70.0 in | Wt 239.2 lb

## 2022-06-26 DIAGNOSIS — G20C Parkinsonism, unspecified: Secondary | ICD-10-CM | POA: Diagnosis not present

## 2022-06-26 DIAGNOSIS — G4733 Obstructive sleep apnea (adult) (pediatric): Secondary | ICD-10-CM | POA: Diagnosis not present

## 2022-06-26 NOTE — Progress Notes (Signed)
Order for machine servicing faxed to [facility] attn Dr Dionicio Stall @ [phone]. Received a receipt of confirmation.

## 2022-06-27 DIAGNOSIS — E1140 Type 2 diabetes mellitus with diabetic neuropathy, unspecified: Secondary | ICD-10-CM | POA: Diagnosis not present

## 2022-06-27 DIAGNOSIS — Z794 Long term (current) use of insulin: Secondary | ICD-10-CM | POA: Diagnosis not present

## 2022-06-27 DIAGNOSIS — I119 Hypertensive heart disease without heart failure: Secondary | ICD-10-CM | POA: Diagnosis not present

## 2022-06-27 DIAGNOSIS — E7800 Pure hypercholesterolemia, unspecified: Secondary | ICD-10-CM | POA: Diagnosis not present

## 2022-07-08 ENCOUNTER — Telehealth: Payer: Self-pay | Admitting: Cardiology

## 2022-07-08 NOTE — Telephone Encounter (Signed)
Patient has been enrolled into the [employer] on the Reduction of MACE w/ Elevated Lp(a) in Established CVD or High Risk for CVD (J3L-MC-EZEF-ACCLAIM-Lp(a)) clinical trial  Investigational medication has been added to his current medications

## 2022-07-09 DIAGNOSIS — M5412 Radiculopathy, cervical region: Secondary | ICD-10-CM | POA: Diagnosis not present

## 2022-07-10 ENCOUNTER — Other Ambulatory Visit: Payer: Self-pay | Admitting: Neurological Surgery

## 2022-07-17 ENCOUNTER — Other Ambulatory Visit: Payer: PPO

## 2022-07-17 ENCOUNTER — Encounter: Payer: Self-pay | Admitting: Cardiology

## 2022-07-17 DIAGNOSIS — E1165 Type 2 diabetes mellitus with hyperglycemia: Secondary | ICD-10-CM | POA: Diagnosis not present

## 2022-07-19 NOTE — Pre-Procedure Instructions (Signed)
Surgical Instructions    Your procedure is scheduled on August 02, 2022.  Report to [facility] Main Entrance "A" at 10:45 A.M., then check in with the Admitting office.  Call this number if you have problems the morning of surgery:  [phone]  If you have any questions prior to your surgery date call [phone]: Open Monday-Friday 8am-4pm If you experience any cold or flu symptoms such as cough, fever, chills, shortness of breath, etc. between now and your scheduled surgery, please notify us at the above number.     Remember:  Do not eat after midnight the night before your surgery  You may drink clear liquids until 9:45 AM the morning of your surgery.   Clear liquids allowed are: Water, Non-Citrus Juices (without pulp), Carbonated Beverages, Clear Tea, Black Coffee Only (NO MILK, CREAM OR POWDERED CREAMER of any kind), and Gatorade.     Take these medicines the morning of surgery with A SIP OF WATER:  allopurinol (ZYLOPRIM)   amLODipine (NORVASC)   busPIRone (BUSPAR)   carbidopa-levodopa (SINEMET IR)   carboxymethylcellulose (REFRESH PLUS) eye drops  DULoxetine (CYMBALTA)   esomeprazole (NEXIUM)   gabapentin (NEURONTIN)   isosorbide mononitrate (IMDUR)   metoprolol succinate (TOPROL-XL)   mirabegron ER (MYRBETRIQ)   oxybutynin (DITROPAN-XL)   pantoprazole (PROTONIX)   rosuvastatin (CRESTOR)   SYNTHROID     May take these medicines IF NEEDED:  acetaminophen (TYLENOL)   albuterol (VENTOLIN HFA) inhaler   colchicine   famotidine (PEPCID)   fluticasone (FLONASE) nasal spray   nitroGLYCERIN (NITROSTAT) please call [phone] if you take this medication prior to surgery   Per your cardiologist, patient can hold Aspirin 3-5 days prior to surgery. Verify with your surgeon that this time frame is acceptable prior to surgery.     Follow your surgeon's instructions on when to stop clopidogrel (PLAVIX).  If no instructions were given by your surgeon then you will need to  call the office to get those instructions.      As of today, STOP taking any Aleve, Naproxen, Ibuprofen, Motrin, Advil, Goody's, BC's, all herbal medications, fish oil, and all vitamins.   WHAT DO I DO ABOUT MY DIABETES MEDICATION?   STOP taking your empagliflozin (JARDIANCE) three days prior to surgery. Your last dose will be July 8th.     THE NIGHT BEFORE SURGERY, take 21 units of insulin glargine-yfgn (SEMGLEE).  Do not take Alogliptin Benzoate  the morning of surgery.   HOW TO MANAGE YOUR DIABETES BEFORE AND AFTER SURGERY  Why is it important to control my blood sugar before and after surgery? Improving blood sugar levels before and after surgery helps healing and can limit problems. A way of improving blood sugar control is eating a healthy diet by:  Eating less sugar and carbohydrates  Increasing activity/exercise  Talking with your doctor about reaching your blood sugar goals High blood sugars (greater than 180 mg/dL) can raise your risk of infections and slow your recovery, so you will need to focus on controlling your diabetes during the weeks before surgery. Make sure that the doctor who takes care of your diabetes knows about your planned surgery including the date and location.  How do I manage my blood sugar before surgery? Check your blood sugar at least 4 times a day, starting 2 days before surgery, to make sure that the level is not too high or low.  Check your blood sugar the morning of your surgery when you wake up and every 2  hours until you get to the Short Stay unit.  If your blood sugar is less than 70 mg/dL, you will need to treat for low blood sugar: Do not take insulin. Treat a low blood sugar (less than 70 mg/dL) with  cup of clear juice (cranberry or apple), 4 glucose tablets, OR glucose gel. Recheck blood sugar in 15 minutes after treatment (to make sure it is greater than 70 mg/dL). If your blood sugar is not greater than 70 mg/dL on recheck, call  [phone] for further instructions. Report your blood sugar to the short stay nurse when you get to Short Stay.  If you are admitted to the hospital after surgery: Your blood sugar will be checked by the staff and you will probably be given insulin after surgery (instead of oral diabetes medicines) to make sure you have good blood sugar levels. The goal for blood sugar control after surgery is 80-180 mg/dL.                     Do NOT Smoke (Tobacco/Vaping) for 24 hours prior to your procedure.  If you use a CPAP at night, you may bring your mask/headgear for your overnight stay.   Contacts, glasses, piercing's, hearing aid's, dentures or partials may not be worn into surgery, please bring cases for these belongings.    For patients admitted to the hospital, discharge time will be determined by your treatment team.   Patients discharged the day of surgery will not be allowed to drive home, and someone needs to stay with them for 24 hours.  SURGICAL WAITING ROOM VISITATION Patients having surgery or a procedure may have no more than 2 support people in the waiting area - these visitors may rotate.   Children under the age of 54 must have an adult with them who is not the patient. If the patient needs to stay at the hospital during part of their recovery, the visitor guidelines for inpatient rooms apply. Pre-op nurse will coordinate an appropriate time for 1 support person to accompany patient in pre-op.  This support person may not rotate.   Please refer to the [facility] website for the visitor guidelines for Inpatients (after your surgery is over and you are in a regular room).    If you received a COVID test during your pre-op visit  it is requested that you wear a mask when out in public, stay away from anyone that may not be feeling well and notify your surgeon if you develop symptoms. If you have been in contact with anyone that has tested positive in the last 10 days please notify  you surgeon.    Pre-operative 5 CHG Bath Instructions   You can play a key role in reducing the risk of infection after surgery. Your skin needs to be as free of germs as possible. You can reduce the number of germs on your skin by washing with CHG (chlorhexidine gluconate) soap before surgery. CHG is an antiseptic soap that kills germs and continues to kill germs even after washing.   DO NOT use if you have an allergy to chlorhexidine/CHG or antibacterial soaps. If your skin becomes reddened or irritated, stop using the CHG and notify one of our RNs at [phone].   Please shower with the CHG soap starting 4 days before surgery using the following schedule:     Please keep in mind the following:  DO NOT shave, including legs and underarms, starting the day of your first  shower.   You may shave your face at any point before/day of surgery.  Place clean sheets on your bed the day you start using CHG soap. Use a clean washcloth (not used since being washed) for each shower. DO NOT sleep with pets once you start using the CHG.   CHG Shower Instructions:  If you choose to wash your hair and private area, wash first with your normal shampoo/soap.  After you use shampoo/soap, rinse your hair and body thoroughly to remove shampoo/soap residue.  Turn the water OFF and apply about 3 tablespoons (45 ml) of CHG soap to a CLEAN washcloth.  Apply CHG soap ONLY FROM YOUR NECK DOWN TO YOUR TOES (washing for 3-5 minutes)  DO NOT use CHG soap on face, private areas, open wounds, or sores.  Pay special attention to the area where your surgery is being performed.  If you are having back surgery, having someone wash your back for you may be helpful. Wait 2 minutes after CHG soap is applied, then you may rinse off the CHG soap.  Pat dry with a clean towel  Put on clean clothes/pajamas   If you choose to wear lotion, please use ONLY the CHG-compatible lotions on the back of this paper.     Additional  instructions for the day of surgery: DO NOT APPLY any lotions, deodorants, cologne, or perfumes.   Do not wear jewelry or makeup Do not wear nail polish, gel polish, artificial nails, or any other type of covering on natural nails (fingers and toes) Do not bring valuables to the hospital. [facility] is not responsible for any belongings or valuables. Put on clean/comfortable clothes.  Brush your teeth.  Ask your nurse before applying any prescription medications to the skin.      CHG Compatible Lotions   Aveeno Moisturizing lotion  Cetaphil Moisturizing Cream  Cetaphil Moisturizing Lotion  Clairol Herbal Essence Moisturizing Lotion, Dry Skin  Clairol Herbal Essence Moisturizing Lotion, Extra Dry Skin  Clairol Herbal Essence Moisturizing Lotion, Normal Skin  Curel Age Defying Therapeutic Moisturizing Lotion with Alpha Hydroxy  Curel Extreme Care Body Lotion  Curel Soothing Hands Moisturizing Hand Lotion  Curel Therapeutic Moisturizing Cream, Fragrance-Free  Curel Therapeutic Moisturizing Lotion, Fragrance-Free  Curel Therapeutic Moisturizing Lotion, Original Formula  Eucerin Daily Replenishing Lotion  Eucerin Dry Skin Therapy Plus Alpha Hydroxy Crme  Eucerin Dry Skin Therapy Plus Alpha Hydroxy Lotion  Eucerin Original Crme  Eucerin Original Lotion  Eucerin Plus Crme Eucerin Plus Lotion  Eucerin TriLipid Replenishing Lotion  Keri Anti-Bacterial Hand Lotion  Keri Deep Conditioning Original Lotion Dry Skin Formula Softly Scented  Keri Deep Conditioning Original Lotion, Fragrance Free Sensitive Skin Formula  Keri Lotion Fast Absorbing Fragrance Free Sensitive Skin Formula  Keri Lotion Fast Absorbing Softly Scented Dry Skin Formula  Keri Original Lotion  Keri Skin Renewal Lotion Keri Silky Smooth Lotion  Keri Silky Smooth Sensitive Skin Lotion  Nivea Body Creamy Conditioning Oil  Nivea Body Extra Enriched Lotion  Nivea Body Original Lotion  Nivea Body Sheer Moisturizing  Lotion Nivea Crme  Nivea Skin Firming Lotion  NutraDerm 30 Skin Lotion  NutraDerm Skin Lotion  NutraDerm Therapeutic Skin Cream  NutraDerm Therapeutic Skin Lotion  ProShield Protective Hand Cream  Provon moisturizing lotion   Please read over the following fact sheets that you were given.

## 2022-07-22 ENCOUNTER — Encounter (HOSPITAL_COMMUNITY)
Admission: RE | Admit: 2022-07-22 | Discharge: 2022-07-22 | Disposition: A | Discharge status: Home / Self Care | Payer: PPO | Source: Ambulatory Visit | Attending: Neurological Surgery | Admitting: Neurological Surgery

## 2022-07-22 ENCOUNTER — Other Ambulatory Visit: Payer: Self-pay

## 2022-07-22 ENCOUNTER — Encounter (HOSPITAL_COMMUNITY): Payer: Self-pay

## 2022-07-22 VITALS — BP 155/82 | HR 75 | Temp 97.7°F | Resp 17 | Ht 70.0 in | Wt 239.0 lb

## 2022-07-22 DIAGNOSIS — I2510 Atherosclerotic heart disease of native coronary artery without angina pectoris: Secondary | ICD-10-CM | POA: Insufficient documentation

## 2022-07-22 DIAGNOSIS — E785 Hyperlipidemia, unspecified: Secondary | ICD-10-CM | POA: Insufficient documentation

## 2022-07-22 DIAGNOSIS — G20A1 Parkinson's disease without dyskinesia, without mention of fluctuations: Secondary | ICD-10-CM | POA: Diagnosis not present

## 2022-07-22 DIAGNOSIS — Z79899 Other long term (current) drug therapy: Secondary | ICD-10-CM | POA: Diagnosis not present

## 2022-07-22 DIAGNOSIS — G4733 Obstructive sleep apnea (adult) (pediatric): Secondary | ICD-10-CM | POA: Diagnosis not present

## 2022-07-22 DIAGNOSIS — K219 Gastro-esophageal reflux disease without esophagitis: Secondary | ICD-10-CM | POA: Insufficient documentation

## 2022-07-22 DIAGNOSIS — Z9861 Coronary angioplasty status: Secondary | ICD-10-CM | POA: Insufficient documentation

## 2022-07-22 DIAGNOSIS — Z01818 Encounter for other preprocedural examination: Secondary | ICD-10-CM

## 2022-07-22 DIAGNOSIS — Z01812 Encounter for preprocedural laboratory examination: Secondary | ICD-10-CM | POA: Insufficient documentation

## 2022-07-22 DIAGNOSIS — E119 Type 2 diabetes mellitus without complications: Secondary | ICD-10-CM | POA: Diagnosis not present

## 2022-07-22 DIAGNOSIS — Z794 Long term (current) use of insulin: Secondary | ICD-10-CM | POA: Diagnosis not present

## 2022-07-22 DIAGNOSIS — I10 Essential (primary) hypertension: Secondary | ICD-10-CM | POA: Diagnosis not present

## 2022-07-22 HISTORY — DX: Polyneuropathy, unspecified: G62.9

## 2022-07-22 HISTORY — DX: Cardiac murmur, unspecified: R01.1

## 2022-07-22 HISTORY — DX: Anxiety disorder, unspecified: F41.9

## 2022-07-22 HISTORY — DX: Depression, unspecified: F32.A

## 2022-07-22 HISTORY — DX: Ventral hernia without obstruction or gangrene: K43.9

## 2022-07-22 HISTORY — DX: Unspecified osteoarthritis, unspecified site: M19.90

## 2022-07-22 HISTORY — DX: Angina pectoris, unspecified: I20.9

## 2022-07-22 HISTORY — DX: Atherosclerotic heart disease of native coronary artery without angina pectoris: I25.10

## 2022-07-22 LAB — BASIC METABOLIC PANEL
Anion gap: 9 (ref 5–15)
BUN: 11 mg/dL (ref 8–23)
CO2: 25 mmol/L (ref 22–32)
Calcium: 9.3 mg/dL (ref 8.9–10.3)
Chloride: 101 mmol/L (ref 98–111)
Creatinine, Ser: 1.09 mg/dL (ref 0.61–1.24)
GFR, Estimated: >60 mL/min (ref >60)
Glucose, Bld: 151 mg/dL — ABNORMAL HIGH (ref 70–99)
Potassium: 4.4 mmol/L (ref 3.5–5.1)
Sodium: 135 mmol/L (ref 135–145)

## 2022-07-22 LAB — CBC
HCT: 46.5 % (ref 39.0–52.0)
Hemoglobin: 14.9 g/dL (ref 13.0–17.0)
MCH: 29 pg (ref 26.0–34.0)
MCHC: 32 g/dL (ref 30.0–36.0)
MCV: 90.5 fL (ref 80.0–100.0)
Platelets: 250 10*3/uL (ref 150–400)
RBC: 5.14 MIL/uL (ref 4.22–5.81)
RDW: 14.3 % (ref 11.5–15.5)
WBC: 8.8 10*3/uL (ref 4.0–10.5)
nRBC: 0 % (ref 0.0–0.2)

## 2022-07-22 LAB — GLUCOSE, CAPILLARY: Glucose-Capillary: 134 mg/dL — ABNORMAL HIGH (ref 70–99)

## 2022-07-22 LAB — SURGICAL PCR SCREEN
MRSA, PCR: NEGATIVE
Staphylococcus aureus: NEGATIVE

## 2022-07-22 LAB — PROTIME-INR
INR: 0.9 (ref 0.8–1.2)
Prothrombin Time: 12.5 seconds (ref 11.4–15.2)

## 2022-07-22 NOTE — Progress Notes (Signed)
PCP - Dr. Guerry Bruin Cardiologist - Dr. Truett Mainland (Last Office Visit 01/28/2022 with 6 month f/u) Neurologist - Dr. Porfirio Mylar Dohmeier (Last Office Visit 06/26/2022 with PA)  PPM/ICD - Denies Device Orders - n/a Rep Notified - n/a  Chest x-ray - Denies EKG - 01/28/2022 Stress Test - 12/24/2021 ECHO - 08/10/2021 Cardiac Cath - 08/28/2021 with angioplasty. No stents placed.  Sleep Study - +OSA. Pt wears CPAP nightly. Pressure setting is 9/13  Pt is DM2. He has a [employer] 2 currently on his left upper arm. Normal fasting blood sugar is around 100. CBG at pre-op appointment 134. A1c result pending.   Last dose of GLP1 agonist- n/a   GLP1 instructions:  n/a  Blood Thinner Instructions: n/a - Per Cardiology, stopped Plavix April 2024. Aspirin Instructions: Cardiology instructed 3-5 day hold. Pt and pts wife instructed to call surgeon to verify 3-5 day hold is acceptable.  ERAS Protcol - Clear liquids until 0945 morning of surgery PRE-SURGERY Ensure or G2- n/a  COVID TEST- n/a   Anesthesia review: Yes. Cardiac Clearance. Pt also has DM2, OSA with CPAP, and newly diagnosed Parkinson's Disease in 2023.    Patient denies shortness of breath, fever, cough and chest pain at PAT appointment. Pt denies any respiratory illness/infection in the last two months.    All instructions explained to the patient, with a verbal understanding of the material. Patient agrees to go over the instructions while at home for a better understanding. Patient also instructed to self quarantine after being tested for COVID-19. The opportunity to ask questions was provided.

## 2022-07-23 ENCOUNTER — Other Ambulatory Visit: Payer: Self-pay | Admitting: Cardiology

## 2022-07-23 ENCOUNTER — Encounter: Payer: Self-pay | Admitting: Cardiology

## 2022-07-23 DIAGNOSIS — E782 Mixed hyperlipidemia: Secondary | ICD-10-CM

## 2022-07-23 DIAGNOSIS — I25118 Atherosclerotic heart disease of native coronary artery with other forms of angina pectoris: Secondary | ICD-10-CM

## 2022-07-23 LAB — HEMOGLOBIN A1C
Hgb A1c MFr Bld: 6.5 % — ABNORMAL HIGH (ref 4.8–5.6)
Mean Plasma Glucose: 140 mg/dL

## 2022-07-23 NOTE — Progress Notes (Signed)
Anesthesia Chart Review:  Follows with cardiology for history of HTN, HLD, CAD s/p mid LAD PTCA 08/2021, OSA on CPAP.  Stress test 12/2021 was nonischemic.  Last seen by Dr. Rosemary Holms 01/28/2022 and noted to be doing well at that time, discussed he could stop Plavix at the end of February 2024.  Clearance letter from Dr. Rosemary Holms dated 07/23/2022 states, "Anthony [name] is at low risk, from a cardiac standpoint, for his upcoming procedure: C3-4, C4-5 Anterior Cervical Fusion w/removal of C5-6 Plate.  It is ok to proceed without further cardiac testing. If applicable can hold Aspirin for 7  day(s) prior to procedure and re-start 1  days post procedure."  Follows with neurology for history of Parkinson disease, maintained on Sinemet.  Hx of GERD, maintained on pantoprazole.   Preop labs reviewed, glucose elevated 151, IDDM2 well-controlled with A1c 6.5, otherwise WNL.  EKG 01/28/2022: Sinus rhythm 55 bpm   Lexiscan Tetrofosmin stress test 12/24/2021: Lexiscan nuclear stress test performed using 1-day protocol. Normal myocardial perfusion. Stress LV EF calculated 48%, although visually appears normal. Low risk study. Previous study on 08/01/2021 reported the following stress perfusion abnormalities, that are now absent. Medium size, mild intensity perfusion defect involving the apical septal, apical anterior, and apex.  Small size, mild intensity perfusion defect involving the basal to mid inferolateral segments.    Coronary intervention 08/28/2021: LM: Normal LAD: Prox-mid 50% disease (Unchanged compared to cath 2021)         Distal focal 75% stenosis (New since cath 2021) Lcx: Prox-mid 50% disease (Unchanged compared to cath 2021) Ramus: Prox 50% disease (Unchanged compared to cath 2021) RCA: Prox-distal 30% disease (Unchanged compared to cath 2021) Lcx: Prox-mid 50% disease   Successful PTCA distal LAD  w/2.0X12 mm balloon at 6 atm Excellent results, thus stenting deferred.   Echocardiogram  08/10/2021:  Normal LV systolic function with EF 69%. Left ventricle cavity is normal  in size. Moderate concentric hypertrophy of the left ventricle. Normal  global wall motion. Normal diastolic filling pattern. Calculated EF 69%.  Trileaflet aortic valve. Trace aortic stenosis. Trace aortic  regurgitation. Mild aortic valve leaflet calcification. Peak velocity    2.55m/s, Peak Pressure Gradient  , Mean Gradient 9.64mmHg, AVA  1.51cm, Dimensionless Index 0.5  Structurally normal tricuspid valve with trace regurgitation. No evidence  of pulmonary hypertension.  IVC is dilated with respiratory variation. May suggest mild elevation in  CVP.     Anthony [name] [facility] Short Stay Center/Anesthesiology Phone [phone] 07/23/2022 1:22 PM

## 2022-07-23 NOTE — Anesthesia Preprocedure Evaluation (Addendum)
Anesthesia Evaluation  Patient identified by MRN, date of birth, ID band Patient awake    Reviewed: Allergy & Precautions, NPO status , Patient's Chart, lab work & pertinent test results  Airway Mallampati: III  TM Distance: >3 FB Neck ROM: Full    Dental no notable dental hx.    Pulmonary sleep apnea and Continuous Positive Airway Pressure Ventilation    Pulmonary exam normal breath sounds clear to auscultation       Cardiovascular hypertension, + CAD (s/p mid LAD PTCA 08/2021)  Normal cardiovascular exam Rhythm:Regular Rate:Normal  Lexiscan Tetrofosmin stress test 12/24/2021: Lexiscan nuclear stress test performed using 1-day protocol. Normal myocardial perfusion. Stress LV EF calculated 48%, although visually appears normal. Low risk study.  07/2021 Echo  Normal LV systolic function with EF 69%. Left ventricle cavity is normal  in size. Moderate concentric hypertrophy of the left ventricle. Normal  global wall motion. Normal diastolic filling pattern. Calculated EF 69%.  Trileaflet aortic valve. Trace aortic stenosis. Trace aortic  regurgitation. Mild aortic valve leaflet calcification. Peak velocity    2.66m/s, Peak Pressure Gradient  , Mean Gradient 9.66mmHg, AVA  1.51cm, Dimensionless Index 0.5  Structurally normal tricuspid valve with trace regurgitation. No evidence  of pulmonary hypertension.  IVC is dilated with respiratory variation. May suggest mild elevation in  CVP.     Neuro/Psych   Anxiety Depression     Neuromuscular disease (Parkinsons)    GI/Hepatic ,GERD  ,,  Endo/Other  diabetesHypothyroidism    Renal/GU Lab Results      Component                Value               Date                      NA                       135                 07/22/2022                CL                       101                 07/22/2022                K                        4.4                 07/22/2022                 CO2                      25                  07/22/2022                BUN                      11                  07/22/2022  CREATININE               1.09                07/22/2022                GFRNONAA                 >60                 07/22/2022                CALCIUM                  9.3                 07/22/2022                ALBUMIN                  4.5                 11/07/2020                GLUCOSE                  151 (H)             07/22/2022                Musculoskeletal  (+) Arthritis , Osteoarthritis,    Abdominal   Peds  Hematology Lab Results      Component                Value               Date                            HGB                      14.9                07/22/2022                HCT                      46.5                07/22/2022                PLT                      250                 07/22/2022              Anesthesia Other Findings All: Lyrica and Metformin   Reproductive/Obstetrics                             Anesthesia Physical Anesthesia Plan  ASA: 3  Anesthesia Plan: General   Post-op Pain Management: Ofirmev IV (intra-op)*   Induction: Intravenous  PONV Risk Score and Plan: Treatment may vary due to age or medical condition, Dexamethasone and Ondansetron  Airway Management Planned: Oral ETT and Video Laryngoscope Planned  Additional Equipment: None  Intra-op Plan:   Post-operative Plan: Extubation in OR  Informed Consent: I have reviewed the patients  History and Physical, chart, labs and discussed the procedure including the risks, benefits and alternatives for the proposed anesthesia with the patient or authorized representative who has indicated his/her understanding and acceptance.     Dental advisory given  Plan Discussed with: CRNA and Anesthesiologist  Anesthesia Plan Comments: (PAT note by Antionette Poles, PA-C: Follows with cardiology for history of HTN,  HLD, CAD s/p mid LAD PTCA 08/2021, OSA on CPAP.  Stress test 12/2021 was nonischemic.  Last seen by Dr. Rosemary Holms 01/28/2022 and noted to be doing well at that time, discussed he could stop Plavix at the end of February 2024.  Clearance letter from Dr. Rosemary Holms dated 07/23/2022 states, "Wells Guiles is at low risk, from a cardiac standpoint, for his upcoming procedure: C3-4, C4-5 Anterior Cervical Fusion w/removal of C5-6 Plate.  It is ok to proceed without further cardiac testing. If applicable can hold Aspirin for 7  day(s) prior to procedure and re-start 1  days post procedure."  Follows with neurology for history of Parkinson disease, maintained on Sinemet.  Hx of GERD, maintained on pantoprazole.   Preop labs reviewed, glucose elevated 151, IDDM2 well-controlled with A1c 6.5, otherwise WNL.  EKG 01/28/2022: Sinus rhythm 55 bpm  Lexiscan Tetrofosmin stress test 12/24/2021: Lexiscan nuclear stress test performed using 1-day protocol. Normal myocardial perfusion. Stress LV EF calculated 48%, although visually appears normal. Low risk study. Previous study on 08/01/2021 reported the following stress perfusion abnormalities, that are now absent. Medium size, mild intensity perfusion defect involving the apical septal, apical anterior, and apex. Small size, mild intensity perfusion defect involving the basal to mid inferolateral segments.   Coronary intervention 08/28/2021: LM: Normal LAD: Prox-mid 50% disease (Unchanged compared to cath 2021) Distal focal 75% stenosis (New since cath 2021) Lcx: Prox-mid 50% disease (Unchanged compared to cath 2021) Ramus: Prox 50% disease (Unchanged compared to cath 2021) RCA: Prox-distal 30% disease (Unchanged compared to cath 2021) Lcx: Prox-mid 50% disease  Successful PTCA distal LAD w/2.0X12 mm balloon at 6 atm Excellent results, thus stenting deferred.  Echocardiogram 08/10/2021:  Normal LV systolic function with EF 69%. Left ventricle  cavity is normal  in size. Moderate concentric hypertrophy of the left ventricle. Normal  global wall motion. Normal diastolic filling pattern. Calculated EF 69%.  Trileaflet aortic valve. Trace aortic stenosis. Trace aortic  regurgitation. Mild aortic valve leaflet calcification. Peak velocity   2.65m/s, Peak Pressure Gradient , Mean Gradient 9.77mmHg, AVA  1.51cm, Dimensionless Index 0.5  Structurally normal tricuspid valve with trace regurgitation. No evidence  of pulmonary hypertension.  IVC is dilated with respiratory variation. May suggest mild elevation in  CVP.    )        Anesthesia Quick Evaluation

## 2022-07-29 ENCOUNTER — Ambulatory Visit: Payer: PPO | Admitting: Cardiology

## 2022-07-29 ENCOUNTER — Encounter: Payer: Self-pay | Admitting: Cardiology

## 2022-07-29 VITALS — BP 127/75 | HR 61 | Ht 70.0 in | Wt 236.0 lb

## 2022-07-29 DIAGNOSIS — I2510 Atherosclerotic heart disease of native coronary artery without angina pectoris: Secondary | ICD-10-CM

## 2022-07-29 DIAGNOSIS — I25118 Atherosclerotic heart disease of native coronary artery with other forms of angina pectoris: Secondary | ICD-10-CM

## 2022-07-29 DIAGNOSIS — I10 Essential (primary) hypertension: Secondary | ICD-10-CM | POA: Diagnosis not present

## 2022-07-29 NOTE — Progress Notes (Signed)
Patient referred by Gaspar Garbe, MD for chest pain  Subjective:   Anthony [name], male    DOB: [birth year], 77 y.o.   MRN: [id number]   Chief Complaint  Patient presents with   Coronary Artery Disease   Follow-up    6 months   Pre-op Exam   77 y.o. Caucasian male with hypertension, hyperlipidemia, CAD, type 2 DM, OSA on CPAP, hypothyroidism, GERD  Patient is doing well. He denies chest pain, shortness of breath, palpitations, leg edema, orthopnea, PND, TIA/syncope.He has had back pain, which has correlated with BP spikes. He has upcoming surgery this Friday by Dr. Yetta Barre.    Current Outpatient Medications:    acetaminophen (TYLENOL) 500 MG tablet, Take 500 mg by mouth every 6 (six) hours as needed for moderate pain., Disp: , Rfl:    albuterol (VENTOLIN HFA) 108 (90 Base) MCG/ACT inhaler, Inhale 1 puff into the lungs every 4 (four) hours as needed for wheezing or shortness of breath., Disp: , Rfl:    allopurinol (ZYLOPRIM) 300 MG tablet, Take 300 mg by mouth daily. , Disp: , Rfl:    Alogliptin Benzoate 25 MG TABS, Take 25 mg by mouth daily., Disp: , Rfl:    amLODipine (NORVASC) 5 MG tablet, Take 5 mg by mouth daily., Disp: , Rfl:    busPIRone (BUSPAR) 10 MG tablet, Take 5 mg by mouth 2 (two) times daily., Disp: , Rfl:    Capsaicin 0.05 % CREA, Apply 1 application  topically at bedtime as needed (neuropathy in feet)., Disp: , Rfl:    carbidopa-levodopa (SINEMET IR) 25-100 MG tablet, TAKE 1 TABLET BY MOUTH THREE TIMES A DAY, Disp: 270 tablet, Rfl: 1   carboxymethylcellulose (REFRESH PLUS) 0.5 % SOLN, Place 1 drop into both eyes 4 (four) times daily., Disp: , Rfl:    Carboxymethylcellulose Sod PF 0.5 % SOLN, Apply to eye., Disp: , Rfl:    colchicine 0.6 MG tablet, Take 1.2 mg by mouth daily as needed (for gout)., Disp: , Rfl:    divalproex (DEPAKOTE ER) 500 MG 24 hr tablet, Take 500 mg by mouth at bedtime., Disp: , Rfl:    DULoxetine (CYMBALTA) 30 MG capsule, Take 30 mg by  mouth daily., Disp: , Rfl:    empagliflozin (JARDIANCE) 25 MG TABS tablet, Take 25 mg by mouth daily., Disp: , Rfl:    esomeprazole (NEXIUM) 40 MG capsule, Take 40 mg by mouth 2 (two) times daily., Disp: , Rfl:    famotidine (PEPCID) 40 MG tablet, Take 40 mg by mouth daily as needed (gerd)., Disp: , Rfl:    fluticasone (FLONASE) 50 MCG/ACT nasal spray, Place 1 spray into both nostrils daily as needed for allergies or rhinitis., Disp: , Rfl:    furosemide (LASIX) 40 MG tablet, Take 40 mg by mouth daily., Disp: , Rfl:    gabapentin (NEURONTIN) 100 MG capsule, Take 200 mg by mouth at bedtime. Take with 600 mg tablet, Disp: , Rfl:    gabapentin (NEURONTIN) 600 MG tablet, Take 600 mg by mouth 3 (three) times daily., Disp: , Rfl:    Homeopathic Products (THERAWORX RELIEF EX), Apply 1 Application topically daily as needed (leg cramps)., Disp: , Rfl:    isosorbide mononitrate (IMDUR) 60 MG 24 hr tablet, TAKE 1 TABLET BY MOUTH EVERY DAY, Disp: 90 tablet, Rfl: 3   ketoconazole (NIZORAL) 2 % cream, Apply 1 application topically daily as needed for irritation (yeast irritation). , Disp: , Rfl:    lisinopril (ZESTRIL) 40  MG tablet, Take 40 mg by mouth daily., Disp: , Rfl:    metoprolol succinate (TOPROL-XL) 100 MG 24 hr tablet, Take 100 mg by mouth daily. Take with or immediately following a meal., Disp: , Rfl:    metroNIDAZOLE (METROGEL) 0.75 % gel, Apply topically., Disp: , Rfl:    Mineral Oil 50 % EMUL, Apply to eye., Disp: , Rfl:    mirabegron ER (MYRBETRIQ) 50 MG TB24 tablet, Take 50 mg by mouth daily., Disp: , Rfl:    Multiple Vitamin (MULTIVITAMIN) capsule, Take 1 capsule by mouth daily., Disp: , Rfl:    nitroGLYCERIN (NITROSTAT) 0.4 MG SL tablet, Place 0.4 mg under the tongue every 5 (five) minutes as needed for chest pain., Disp: , Rfl:    oxybutynin (DITROPAN-XL) 10 MG 24 hr tablet, Take 10 mg by mouth daily., Disp: , Rfl:    pantoprazole (PROTONIX) 40 MG tablet, Take protonix 40 mg twice daily for 2  months.  Resume once per day after that. (Patient taking differently: Take 40 mg by mouth daily.), Disp: 60 tablet, Rfl: 0   polyethylene glycol (MIRALAX / GLYCOLAX) 17 g packet, Take 17 g by mouth daily as needed for mild constipation or moderate constipation., Disp: , Rfl:    pyridOXINE (VITAMIN B-6) 100 MG tablet, Take 100 mg by mouth daily., Disp: , Rfl:    rosuvastatin (CRESTOR) 40 MG tablet, Take 1 tablet (40 mg total) by mouth daily., Disp: 90 tablet, Rfl: 3   Sodium Fluoride 1.1 % PSTE, Place 1 Application onto teeth 2 (two) times daily., Disp: , Rfl:    solifenacin (VESICARE) 5 MG tablet, Take 5 mg by mouth., Disp: , Rfl:    SYNTHROID 25 MCG tablet, Take 25 mcg by mouth daily before breakfast., Disp: , Rfl:    tamsulosin (FLOMAX) 0.4 MG CAPS capsule, Take 0.4 mg by mouth every evening. , Disp: , Rfl:    traZODone (DESYREL) 50 MG tablet, Take 50 mg by mouth at bedtime., Disp: , Rfl:    White Petrolatum-Mineral Oil (SOOTHE NIGHTTIME) OINT, Apply 1 Application to eye at bedtime., Disp: , Rfl:    aspirin 81 MG EC tablet, TAKE 1 TABLET (81 MG TOTAL) BY MOUTH DAILY. SWALLOW WHOLE. (Patient not taking: Reported on 07/29/2022), Disp: 90 tablet, Rfl: 3   insulin glargine-yfgn (SEMGLEE) 100 UNIT/ML Pen, Inject 42 Units into the skin at bedtime., Disp: , Rfl:    Investigational - Study Medication, Inject 1 Dose as directed every 6 (six) months. Study name: Lepodisiran on the Reduction of MACE w/ Elevated Lp(a) in Established CVD or High Risk for CVD (J3L-MC-EZEF-ACCLAIM-Lp(a))  Additional study details: [employer] [phone], Disp: , Rfl:   Cardiovascular studies:  EKG 07/29/2022: Sinus rhythm 58 bpm Nonspecific T-abnormality  Lexiscan Tetrofosmin stress test 12/24/2021: Lexiscan nuclear stress test performed using 1-day protocol. Normal myocardial perfusion. Stress LV EF calculated 48%, although visually appears normal. Low risk study. Previous study on 08/01/2021 reported the  following stress perfusion abnormalities, that are now absent. Medium size, mild intensity perfusion defect involving the apical septal, apical anterior, and apex.  Small size, mild intensity perfusion defect involving the basal to mid inferolateral segments.    Coronary intervention 08/28/2021: LM: Normal LAD: Prox-mid 50% disease (Unchanged compared to cath 2021)         Distal focal 75% stenosis (New since cath 2021) Lcx: Prox-mid 50% disease (Unchanged compared to cath 2021) Ramus: Prox 50% disease (Unchanged compared to cath 2021) RCA: Prox-distal 30% disease (Unchanged compared  to cath 2021) Lcx: Prox-mid 50% disease   Successful PTCA distal LAD  w/2.0X12 mm balloon at 6 atm Excellent results, thus stenting deferred.  Echocardiogram 08/10/2021:  Normal LV systolic function with EF 69%. Left ventricle cavity is normal  in size. Moderate concentric hypertrophy of the left ventricle. Normal  global wall motion. Normal diastolic filling pattern. Calculated EF 69%.  Trileaflet aortic valve. Trace aortic stenosis. Trace aortic  regurgitation. Mild aortic valve leaflet calcification. Peak velocity    2.73m/s, Peak Pressure Gradient  , Mean Gradient 9.57mmHg, AVA  1.51cm, Dimensionless Index 0.5  Structurally normal tricuspid valve with trace regurgitation. No evidence  of pulmonary hypertension.  IVC is dilated with respiratory variation. May suggest mild elevation in  CVP.   Recent labs: 09/25/2021: Chol 125, TG 200, HDL 32, LDL 60  08/20/2021: Glucose 134, BUN/Cr 16/1.11. EGFR 69. Na/K 139/4.8.  H/H 15/46. MCV 88. Platelets 254  11/2020: Chol 145, TG 197, HDL 33, LDL 79  Review of Systems  Cardiovascular:  Negative for chest pain, dyspnea on exertion, leg swelling, palpitations and syncope.         Vitals:   07/29/22 1255  BP: 127/75  Pulse: 61  SpO2: 93%     Body mass index is 33.86 kg/m. Filed Weights   07/29/22 1255  Weight: 236 lb (107 kg)      Objective:   Physical Exam Vitals reviewed.  Constitutional:      Appearance: He is well-developed.  Neck:     Vascular: No JVD.  Cardiovascular:     Rate and Rhythm: Normal rate and regular rhythm.     Pulses: Intact distal pulses.     Heart sounds: Normal heart sounds. No murmur heard. Pulmonary:     Effort: Pulmonary effort is normal.     Breath sounds: Normal breath sounds. No wheezing or rales.  Musculoskeletal:     Right lower leg: No edema.     Left lower leg: No edema.    Visit diagnoses:   ICD-10-CM   1. Coronary artery disease involving native coronary artery of native heart without angina pectoris  I25.10 EKG 12-Lead    CANCELED: EKG 12-Lead    2. Essential hypertension  I10           Assessment & Recommendations:   77 y.o. Caucasian male with hypertension, hyperlipidemia, mod nonobstructive CAD, type 2 DM, OSA on CPAP, hypothyroidism, GERD  Pre-op risk stratification: Low cardiac risk. Holding Aspirin as requested by Dr. Yetta Barre.  CAD: S/p mid LAD PTCA (08/2021). Recommend DAPT with Aspirin and plavix till 04/2022 No ischemia on stress testing (01/2021) He is unsure if he is taking plavix currently. If he is not, reasonable to hold off. If he is, may stop at the end of Feb 2024.   Hypertension: Well controlled.   Type 2 diabetes mellitus without complication: Continue current management, including Jardiance.  Mixed hyperlipidemia: Chol 125, TG 200, HDL 32, LDL 60 (09/2021) on Crestor 20 mg daily. Continue diet modification.  No changes today.  F/u in 6 months    [employer], PA-C 07/29/2022, 1:09 PM Office: [phone]

## 2022-08-02 ENCOUNTER — Observation Stay (HOSPITAL_COMMUNITY)
Admission: RE | Admit: 2022-08-02 | Discharge: 2022-08-03 | Disposition: A | Discharge status: Home / Self Care | Payer: PPO | Source: Ambulatory Visit | Attending: Neurological Surgery | Admitting: Neurological Surgery

## 2022-08-02 ENCOUNTER — Ambulatory Visit (HOSPITAL_BASED_OUTPATIENT_CLINIC_OR_DEPARTMENT_OTHER): Payer: PPO | Admitting: Anesthesiology

## 2022-08-02 ENCOUNTER — Other Ambulatory Visit: Payer: Self-pay

## 2022-08-02 ENCOUNTER — Encounter (HOSPITAL_COMMUNITY): Payer: Self-pay | Admitting: Neurological Surgery

## 2022-08-02 ENCOUNTER — Ambulatory Visit (HOSPITAL_COMMUNITY)
Admission: RE | Disposition: A | Discharge status: Home / Self Care | Payer: Self-pay | Source: Ambulatory Visit | Attending: Neurological Surgery

## 2022-08-02 ENCOUNTER — Ambulatory Visit (HOSPITAL_COMMUNITY): Payer: PPO | Admitting: Physician Assistant

## 2022-08-02 ENCOUNTER — Ambulatory Visit (HOSPITAL_COMMUNITY): Payer: PPO

## 2022-08-02 DIAGNOSIS — I10 Essential (primary) hypertension: Secondary | ICD-10-CM | POA: Insufficient documentation

## 2022-08-02 DIAGNOSIS — M47812 Spondylosis without myelopathy or radiculopathy, cervical region: Secondary | ICD-10-CM | POA: Diagnosis not present

## 2022-08-02 DIAGNOSIS — E119 Type 2 diabetes mellitus without complications: Secondary | ICD-10-CM

## 2022-08-02 DIAGNOSIS — M47892 Other spondylosis, cervical region: Secondary | ICD-10-CM

## 2022-08-02 DIAGNOSIS — M4802 Spinal stenosis, cervical region: Secondary | ICD-10-CM | POA: Diagnosis not present

## 2022-08-02 DIAGNOSIS — I25118 Atherosclerotic heart disease of native coronary artery with other forms of angina pectoris: Secondary | ICD-10-CM

## 2022-08-02 DIAGNOSIS — Z7982 Long term (current) use of aspirin: Secondary | ICD-10-CM | POA: Insufficient documentation

## 2022-08-02 DIAGNOSIS — G20C Parkinsonism, unspecified: Secondary | ICD-10-CM | POA: Diagnosis not present

## 2022-08-02 DIAGNOSIS — I2510 Atherosclerotic heart disease of native coronary artery without angina pectoris: Secondary | ICD-10-CM | POA: Insufficient documentation

## 2022-08-02 DIAGNOSIS — Z79899 Other long term (current) drug therapy: Secondary | ICD-10-CM | POA: Diagnosis not present

## 2022-08-02 DIAGNOSIS — G4733 Obstructive sleep apnea (adult) (pediatric): Secondary | ICD-10-CM | POA: Diagnosis not present

## 2022-08-02 DIAGNOSIS — Z981 Arthrodesis status: Principal | ICD-10-CM

## 2022-08-02 DIAGNOSIS — M4722 Other spondylosis with radiculopathy, cervical region: Principal | ICD-10-CM | POA: Insufficient documentation

## 2022-08-02 DIAGNOSIS — E039 Hypothyroidism, unspecified: Secondary | ICD-10-CM | POA: Insufficient documentation

## 2022-08-02 DIAGNOSIS — I209 Angina pectoris, unspecified: Secondary | ICD-10-CM

## 2022-08-02 HISTORY — PX: ANTERIOR CERVICAL DECOMP/DISCECTOMY FUSION: SHX1161

## 2022-08-02 LAB — GLUCOSE, CAPILLARY
Glucose-Capillary: 100 mg/dL — ABNORMAL HIGH (ref 70–99)
Glucose-Capillary: 103 mg/dL — ABNORMAL HIGH (ref 70–99)
Glucose-Capillary: 130 mg/dL — ABNORMAL HIGH (ref 70–99)

## 2022-08-02 LAB — TYPE AND SCREEN
ABO/RH(D): A POS
Antibody Screen: NEGATIVE

## 2022-08-02 SURGERY — ANTERIOR CERVICAL DECOMPRESSION/DISCECTOMY FUSION 2 LEVELS
Anesthesia: General | Site: Spine Cervical

## 2022-08-02 MED ORDER — PROPOFOL 10 MG/ML IV BOLUS
INTRAVENOUS | Status: AC
  Filled 2022-08-02: qty 20

## 2022-08-02 MED ORDER — THROMBIN (RECOMBINANT) 5000 UNITS EX SOLR: CUTANEOUS | Status: DC | PRN

## 2022-08-02 MED ORDER — METHOCARBAMOL 1000 MG/10ML IJ SOLN: 500.0000 mg | Freq: Four times a day (QID) | INTRAVENOUS | Status: DC | PRN

## 2022-08-02 MED ORDER — PANTOPRAZOLE SODIUM 40 MG PO TBEC
40.0000 mg | DELAYED_RELEASE_TABLET | Freq: Every day | ORAL | Status: DC
  Administered 2022-08-03: 40 mg via ORAL
  Filled 2022-08-02: qty 1

## 2022-08-02 MED ORDER — 0.9 % SODIUM CHLORIDE (POUR BTL) OPTIME
TOPICAL | Status: DC | PRN
  Administered 2022-08-02: 1000 mL

## 2022-08-02 MED ORDER — MIRABEGRON ER 50 MG PO TB24
50.0000 mg | ORAL_TABLET | Freq: Every day | ORAL | Status: DC
  Administered 2022-08-02 – 2022-08-03 (×2): 50 mg via ORAL
  Filled 2022-08-02 (×2): qty 1

## 2022-08-02 MED ORDER — FENTANYL CITRATE (PF) 250 MCG/5ML IJ SOLN
INTRAMUSCULAR | Status: AC
  Filled 2022-08-02: qty 5

## 2022-08-02 MED ORDER — LISINOPRIL 20 MG PO TABS
40.0000 mg | ORAL_TABLET | Freq: Every day | ORAL | Status: DC
  Administered 2022-08-02 – 2022-08-03 (×2): 40 mg via ORAL
  Filled 2022-08-02 (×2): qty 2

## 2022-08-02 MED ORDER — GLYCOPYRROLATE PF 0.2 MG/ML IJ SOSY
PREFILLED_SYRINGE | INTRAMUSCULAR | Status: DC | PRN
  Administered 2022-08-02: .1 mg via INTRAVENOUS

## 2022-08-02 MED ORDER — ONDANSETRON HCL 4 MG/2ML IJ SOLN: 4.0000 mg | Freq: Once | INTRAMUSCULAR | Status: DC | PRN

## 2022-08-02 MED ORDER — LIDOCAINE 2% (20 MG/ML) 5 ML SYRINGE
INTRAMUSCULAR | Status: AC
  Filled 2022-08-02: qty 5

## 2022-08-02 MED ORDER — EMPAGLIFLOZIN 25 MG PO TABS
25.0000 mg | ORAL_TABLET | Freq: Every day | ORAL | Status: DC
  Administered 2022-08-02 – 2022-08-03 (×2): 25 mg via ORAL
  Filled 2022-08-02 (×2): qty 1

## 2022-08-02 MED ORDER — SODIUM CHLORIDE 0.9% FLUSH
3.0000 mL | Freq: Two times a day (BID) | INTRAVENOUS | Status: DC
  Administered 2022-08-02: 3 mL via INTRAVENOUS

## 2022-08-02 MED ORDER — SUCCINYLCHOLINE CHLORIDE 200 MG/10ML IV SOSY
PREFILLED_SYRINGE | INTRAVENOUS | Status: DC | PRN
  Administered 2022-08-02: 110 mg via INTRAVENOUS

## 2022-08-02 MED ORDER — METHOCARBAMOL 500 MG PO TABS
500.0000 mg | ORAL_TABLET | Freq: Four times a day (QID) | ORAL | Status: DC | PRN
  Administered 2022-08-03 (×2): 500 mg via ORAL
  Filled 2022-08-02 (×2): qty 1

## 2022-08-02 MED ORDER — ACETAMINOPHEN 10 MG/ML IV SOLN: 1000.0000 mg | Freq: Once | INTRAVENOUS | Status: DC | PRN

## 2022-08-02 MED ORDER — BUSPIRONE HCL 10 MG PO TABS
5.0000 mg | ORAL_TABLET | Freq: Two times a day (BID) | ORAL | Status: DC
  Administered 2022-08-02 – 2022-08-03 (×2): 5 mg via ORAL
  Filled 2022-08-02 (×2): qty 1

## 2022-08-02 MED ORDER — ALOGLIPTIN BENZOATE 25 MG PO TABS: 25.0000 mg | ORAL_TABLET | Freq: Every day | ORAL | Status: DC

## 2022-08-02 MED ORDER — ISOSORBIDE MONONITRATE ER 60 MG PO TB24
60.0000 mg | ORAL_TABLET | Freq: Every day | ORAL | Status: DC
  Administered 2022-08-02 – 2022-08-03 (×2): 60 mg via ORAL
  Filled 2022-08-02 (×2): qty 1

## 2022-08-02 MED ORDER — FESOTERODINE FUMARATE ER 4 MG PO TB24
4.0000 mg | ORAL_TABLET | Freq: Every day | ORAL | Status: DC
  Administered 2022-08-02 – 2022-08-03 (×2): 4 mg via ORAL
  Filled 2022-08-02 (×2): qty 1

## 2022-08-02 MED ORDER — CEFAZOLIN SODIUM-DEXTROSE 2-4 GM/100ML-% IV SOLN
2.0000 g | INTRAVENOUS | Status: AC
  Administered 2022-08-02: 2 g via INTRAVENOUS

## 2022-08-02 MED ORDER — METOPROLOL SUCCINATE ER 100 MG PO TB24
100.0000 mg | ORAL_TABLET | Freq: Every day | ORAL | Status: DC
  Administered 2022-08-03: 100 mg via ORAL
  Filled 2022-08-02: qty 1

## 2022-08-02 MED ORDER — CARBIDOPA-LEVODOPA 25-100 MG PO TABS
1.0000 | ORAL_TABLET | Freq: Three times a day (TID) | ORAL | Status: DC
  Administered 2022-08-02 – 2022-08-03 (×3): 1 via ORAL
  Filled 2022-08-02 (×3): qty 1

## 2022-08-02 MED ORDER — GABAPENTIN 300 MG PO CAPS
600.0000 mg | ORAL_CAPSULE | Freq: Three times a day (TID) | ORAL | Status: DC
  Administered 2022-08-02 – 2022-08-03 (×3): 600 mg via ORAL
  Filled 2022-08-02 (×3): qty 2

## 2022-08-02 MED ORDER — CEFAZOLIN SODIUM-DEXTROSE 2-4 GM/100ML-% IV SOLN
2.0000 g | Freq: Three times a day (TID) | INTRAVENOUS | Status: AC
  Administered 2022-08-02 – 2022-08-03 (×2): 2 g via INTRAVENOUS
  Filled 2022-08-02 (×2): qty 100

## 2022-08-02 MED ORDER — MORPHINE SULFATE (PF) 2 MG/ML IV SOLN: 2.0000 mg | INTRAVENOUS | Status: DC | PRN

## 2022-08-02 MED ORDER — DIVALPROEX SODIUM ER 500 MG PO TB24
500.0000 mg | ORAL_TABLET | Freq: Every day | ORAL | Status: DC
  Administered 2022-08-02: 500 mg via ORAL
  Filled 2022-08-02: qty 1

## 2022-08-02 MED ORDER — ROCURONIUM BROMIDE 10 MG/ML (PF) SYRINGE
PREFILLED_SYRINGE | INTRAVENOUS | Status: AC
  Filled 2022-08-02: qty 10

## 2022-08-02 MED ORDER — HYDROMORPHONE HCL 1 MG/ML IJ SOLN: 0.2500 mg | INTRAMUSCULAR | Status: DC | PRN

## 2022-08-02 MED ORDER — ONDANSETRON HCL 4 MG/2ML IJ SOLN
INTRAMUSCULAR | Status: AC
  Filled 2022-08-02: qty 2

## 2022-08-02 MED ORDER — LACTATED RINGERS IV SOLN: INTRAVENOUS | Status: DC

## 2022-08-02 MED ORDER — OXYBUTYNIN CHLORIDE ER 10 MG PO TB24
10.0000 mg | ORAL_TABLET | Freq: Every day | ORAL | Status: DC
  Administered 2022-08-02 – 2022-08-03 (×2): 10 mg via ORAL
  Filled 2022-08-02 (×2): qty 1

## 2022-08-02 MED ORDER — CHLORHEXIDINE GLUCONATE 0.12 % MT SOLN
OROMUCOSAL | Status: AC
  Administered 2022-08-02: 15 mL via OROMUCOSAL
  Filled 2022-08-02: qty 15

## 2022-08-02 MED ORDER — ALLOPURINOL 300 MG PO TABS
300.0000 mg | ORAL_TABLET | Freq: Every day | ORAL | Status: DC
  Administered 2022-08-02 – 2022-08-03 (×2): 300 mg via ORAL
  Filled 2022-08-02 (×2): qty 1

## 2022-08-02 MED ORDER — ONDANSETRON HCL 4 MG/2ML IJ SOLN
INTRAMUSCULAR | Status: DC | PRN
  Administered 2022-08-02: 4 mg via INTRAVENOUS

## 2022-08-02 MED ORDER — ACETAMINOPHEN 500 MG PO TABS
ORAL_TABLET | ORAL | Status: AC
  Administered 2022-08-02: 1000 mg via ORAL
  Filled 2022-08-02: qty 2

## 2022-08-02 MED ORDER — FUROSEMIDE 40 MG PO TABS
40.0000 mg | ORAL_TABLET | Freq: Every day | ORAL | Status: DC
  Filled 2022-08-02: qty 1

## 2022-08-02 MED ORDER — ONDANSETRON HCL 4 MG/2ML IJ SOLN: 4.0000 mg | Freq: Four times a day (QID) | INTRAMUSCULAR | Status: DC | PRN

## 2022-08-02 MED ORDER — CHLORHEXIDINE GLUCONATE CLOTH 2 % EX PADS: 6.0000 | MEDICATED_PAD | Freq: Once | CUTANEOUS | Status: DC

## 2022-08-02 MED ORDER — THROMBIN 5000 UNITS EX SOLR: OROMUCOSAL | Status: DC | PRN

## 2022-08-02 MED ORDER — LIDOCAINE 2% (20 MG/ML) 5 ML SYRINGE
INTRAMUSCULAR | Status: DC | PRN
  Administered 2022-08-02: 60 mg via INTRAVENOUS

## 2022-08-02 MED ORDER — TRAZODONE HCL 50 MG PO TABS
50.0000 mg | ORAL_TABLET | Freq: Every day | ORAL | Status: DC
  Administered 2022-08-02: 50 mg via ORAL
  Filled 2022-08-02: qty 1

## 2022-08-02 MED ORDER — SODIUM CHLORIDE 0.9 % IV SOLN
250.0000 mL | INTRAVENOUS | Status: DC
  Administered 2022-08-02: 250 mL via INTRAVENOUS

## 2022-08-02 MED ORDER — ACETAMINOPHEN 650 MG RE SUPP: 650.0000 mg | RECTAL | Status: DC | PRN

## 2022-08-02 MED ORDER — ROCURONIUM BROMIDE 10 MG/ML (PF) SYRINGE
PREFILLED_SYRINGE | INTRAVENOUS | Status: DC | PRN
  Administered 2022-08-02: 100 mg via INTRAVENOUS
  Administered 2022-08-02: 30 mg via INTRAVENOUS

## 2022-08-02 MED ORDER — KETAMINE HCL 10 MG/ML IJ SOLN
INTRAMUSCULAR | Status: DC | PRN
  Administered 2022-08-02: 20 mg via INTRAVENOUS

## 2022-08-02 MED ORDER — ACETAMINOPHEN 500 MG PO TABS: 1000.0000 mg | ORAL_TABLET | ORAL | Status: AC

## 2022-08-02 MED ORDER — CEFAZOLIN SODIUM-DEXTROSE 2-4 GM/100ML-% IV SOLN
INTRAVENOUS | Status: AC
  Filled 2022-08-02: qty 100

## 2022-08-02 MED ORDER — KETAMINE HCL 50 MG/5ML IJ SOSY
PREFILLED_SYRINGE | INTRAMUSCULAR | Status: AC
  Filled 2022-08-02: qty 5

## 2022-08-02 MED ORDER — CHLORHEXIDINE GLUCONATE 0.12 % MT SOLN: 15.0000 mL | Freq: Once | OROMUCOSAL | Status: AC

## 2022-08-02 MED ORDER — DULOXETINE HCL 30 MG PO CPEP
30.0000 mg | ORAL_CAPSULE | Freq: Every day | ORAL | Status: DC
  Administered 2022-08-03: 30 mg via ORAL
  Filled 2022-08-02: qty 1

## 2022-08-02 MED ORDER — ONDANSETRON HCL 4 MG PO TABS: 4.0000 mg | ORAL_TABLET | Freq: Four times a day (QID) | ORAL | Status: DC | PRN

## 2022-08-02 MED ORDER — DEXMEDETOMIDINE HCL IN NACL 80 MCG/20ML IV SOLN
INTRAVENOUS | Status: AC
  Filled 2022-08-02: qty 20

## 2022-08-02 MED ORDER — METOPROLOL SUCCINATE ER 25 MG PO TB24
100.0000 mg | ORAL_TABLET | Freq: Once | ORAL | Status: AC
  Administered 2022-08-02: 100 mg via ORAL
  Filled 2022-08-02: qty 4

## 2022-08-02 MED ORDER — LINAGLIPTIN 5 MG PO TABS
5.0000 mg | ORAL_TABLET | Freq: Every day | ORAL | Status: DC
  Administered 2022-08-02 – 2022-08-03 (×2): 5 mg via ORAL
  Filled 2022-08-02 (×2): qty 1

## 2022-08-02 MED ORDER — ALBUTEROL SULFATE (2.5 MG/3ML) 0.083% IN NEBU: 2.5000 mg | INHALATION_SOLUTION | RESPIRATORY_TRACT | Status: DC | PRN

## 2022-08-02 MED ORDER — GABAPENTIN 300 MG PO CAPS: 300.0000 mg | ORAL_CAPSULE | ORAL | Status: AC

## 2022-08-02 MED ORDER — FENTANYL CITRATE (PF) 250 MCG/5ML IJ SOLN
INTRAMUSCULAR | Status: DC | PRN
  Administered 2022-08-02: 100 ug via INTRAVENOUS
  Administered 2022-08-02: 50 ug via INTRAVENOUS

## 2022-08-02 MED ORDER — GABAPENTIN 100 MG PO CAPS
200.0000 mg | ORAL_CAPSULE | Freq: Every day | ORAL | Status: DC
  Administered 2022-08-02: 200 mg via ORAL
  Filled 2022-08-02: qty 2

## 2022-08-02 MED ORDER — THROMBIN 5000 UNITS EX SOLR
CUTANEOUS | Status: AC
  Filled 2022-08-02: qty 5000

## 2022-08-02 MED ORDER — THROMBIN 5000 UNITS EX SOLR
CUTANEOUS | Status: AC
  Filled 2022-08-02: qty 10000

## 2022-08-02 MED ORDER — BUPIVACAINE HCL (PF) 0.25 % IJ SOLN
INTRAMUSCULAR | Status: AC
  Filled 2022-08-02: qty 30

## 2022-08-02 MED ORDER — OXYCODONE HCL 5 MG/5ML PO SOLN: 5.0000 mg | Freq: Once | ORAL | Status: DC | PRN

## 2022-08-02 MED ORDER — HYDROCODONE-ACETAMINOPHEN 5-325 MG PO TABS
1.0000 | ORAL_TABLET | ORAL | Status: DC | PRN
  Administered 2022-08-02 – 2022-08-03 (×3): 1 via ORAL
  Filled 2022-08-02 (×4): qty 1

## 2022-08-02 MED ORDER — AMLODIPINE BESYLATE 5 MG PO TABS
5.0000 mg | ORAL_TABLET | Freq: Every day | ORAL | Status: DC
  Administered 2022-08-03: 5 mg via ORAL
  Filled 2022-08-02: qty 1

## 2022-08-02 MED ORDER — GABAPENTIN 300 MG PO CAPS
ORAL_CAPSULE | ORAL | Status: AC
  Administered 2022-08-02: 300 mg via ORAL
  Filled 2022-08-02: qty 1

## 2022-08-02 MED ORDER — PHENYLEPHRINE HCL-NACL 20-0.9 MG/250ML-% IV SOLN
INTRAVENOUS | Status: DC | PRN
  Administered 2022-08-02: 25 ug/min via INTRAVENOUS

## 2022-08-02 MED ORDER — PHENOL 1.4 % MT LIQD: 1.0000 | OROMUCOSAL | Status: DC | PRN

## 2022-08-02 MED ORDER — SENNA 8.6 MG PO TABS
1.0000 | ORAL_TABLET | Freq: Two times a day (BID) | ORAL | Status: DC
  Administered 2022-08-02 – 2022-08-03 (×2): 8.6 mg via ORAL
  Filled 2022-08-02 (×2): qty 1

## 2022-08-02 MED ORDER — LEVOTHYROXINE SODIUM 25 MCG PO TABS
25.0000 ug | ORAL_TABLET | Freq: Every day | ORAL | Status: DC
  Administered 2022-08-03: 25 ug via ORAL
  Filled 2022-08-02: qty 1

## 2022-08-02 MED ORDER — SUGAMMADEX SODIUM 200 MG/2ML IV SOLN
INTRAVENOUS | Status: DC | PRN
  Administered 2022-08-02: 300 mg via INTRAVENOUS

## 2022-08-02 MED ORDER — ACETAMINOPHEN 325 MG PO TABS: 650.0000 mg | ORAL_TABLET | ORAL | Status: DC | PRN

## 2022-08-02 MED ORDER — INSULIN ASPART 100 UNIT/ML IJ SOLN: 0.0000 [IU] | INTRAMUSCULAR | Status: DC | PRN

## 2022-08-02 MED ORDER — DEXAMETHASONE SODIUM PHOSPHATE 10 MG/ML IJ SOLN
INTRAMUSCULAR | Status: DC | PRN
  Administered 2022-08-02: 10 mg via INTRAVENOUS

## 2022-08-02 MED ORDER — ORAL CARE MOUTH RINSE: 15.0000 mL | Freq: Once | OROMUCOSAL | Status: AC

## 2022-08-02 MED ORDER — POTASSIUM CHLORIDE IN NACL 20-0.9 MEQ/L-% IV SOLN: INTRAVENOUS | Status: DC

## 2022-08-02 MED ORDER — PROPOFOL 10 MG/ML IV BOLUS
INTRAVENOUS | Status: DC | PRN
  Administered 2022-08-02: 20 mg via INTRAVENOUS
  Administered 2022-08-02: 160 mg via INTRAVENOUS

## 2022-08-02 MED ORDER — TAMSULOSIN HCL 0.4 MG PO CAPS: 0.4000 mg | ORAL_CAPSULE | Freq: Every evening | ORAL | Status: DC

## 2022-08-02 MED ORDER — SODIUM CHLORIDE 0.9% FLUSH: 3.0000 mL | INTRAVENOUS | Status: DC | PRN

## 2022-08-02 MED ORDER — DEXAMETHASONE SODIUM PHOSPHATE 10 MG/ML IJ SOLN
INTRAMUSCULAR | Status: AC
  Filled 2022-08-02: qty 1

## 2022-08-02 MED ORDER — BUPIVACAINE HCL (PF) 0.25 % IJ SOLN
INTRAMUSCULAR | Status: DC | PRN
  Administered 2022-08-02: 5 mL

## 2022-08-02 MED ORDER — SODIUM CHLORIDE 0.9 % IV SOLN: INTRAVENOUS | Status: DC | PRN

## 2022-08-02 MED ORDER — MENTHOL 3 MG MT LOZG: 1.0000 | LOZENGE | OROMUCOSAL | Status: DC | PRN

## 2022-08-02 MED ORDER — OXYCODONE HCL 5 MG PO TABS: 5.0000 mg | ORAL_TABLET | Freq: Once | ORAL | Status: DC | PRN

## 2022-08-02 SURGICAL SUPPLY — 51 items
APL SKNCLS STERI-STRIP NONHPOA (GAUZE/BANDAGES/DRESSINGS) ×1
BAG COUNTER SPONGE SURGICOUNT (BAG) ×1 IMPLANT
BAG SPNG CNTER NS LX DISP (BAG) ×1
BASKET BONE COLLECTION (BASKET) IMPLANT
BENZOIN TINCTURE PRP APPL 2/3 (GAUZE/BANDAGES/DRESSINGS) ×1 IMPLANT
BUR CARBIDE MATCH 3.0 (BURR) ×1 IMPLANT
CANISTER SUCT 3000ML PPV (MISCELLANEOUS) ×1 IMPLANT
DRAPE C-ARM 42X72 X-RAY (DRAPES) ×2 IMPLANT
DRAPE LAPAROTOMY 100X72 PEDS (DRAPES) ×1 IMPLANT
DRAPE MICROSCOPE SLANT 54X150 (MISCELLANEOUS) ×1 IMPLANT
DRSG OPSITE 4X5.5 SM (GAUZE/BANDAGES/DRESSINGS) IMPLANT
DURAPREP 6ML APPLICATOR 50/CS (WOUND CARE) ×1 IMPLANT
ELECT COATED BLADE 2.86 ST (ELECTRODE) ×1 IMPLANT
ELECT REM PT RETURN 9FT ADLT (ELECTROSURGICAL) ×1
ELECTRODE REM PT RTRN 9FT ADLT (ELECTROSURGICAL) ×1 IMPLANT
GAUZE 4X4 16PLY ~~LOC~~+RFID DBL (SPONGE) IMPLANT
GLOVE BIO SURGEON STRL SZ7 (GLOVE) IMPLANT
GLOVE BIO SURGEON STRL SZ8 (GLOVE) ×1 IMPLANT
GLOVE BIOGEL PI IND STRL 7.0 (GLOVE) IMPLANT
GOWN STRL REUS W/ TWL LRG LVL3 (GOWN DISPOSABLE) IMPLANT
GOWN STRL REUS W/ TWL XL LVL3 (GOWN DISPOSABLE) ×1 IMPLANT
GOWN STRL REUS W/TWL 2XL LVL3 (GOWN DISPOSABLE) IMPLANT
GOWN STRL REUS W/TWL LRG LVL3 (GOWN DISPOSABLE)
GOWN STRL REUS W/TWL XL LVL3 (GOWN DISPOSABLE) ×1
HEMOSTAT POWDER KIT SURGIFOAM (HEMOSTASIS) ×1 IMPLANT
KIT BASIN OR (CUSTOM PROCEDURE TRAY) ×1 IMPLANT
KIT TURNOVER KIT B (KITS) ×1 IMPLANT
NDL HYPO 25X1 1.5 SAFETY (NEEDLE) ×1 IMPLANT
NDL SPNL 20GX3.5 QUINCKE YW (NEEDLE) ×1 IMPLANT
NEEDLE HYPO 25X1 1.5 SAFETY (NEEDLE) ×1 IMPLANT
NEEDLE SPNL 20GX3.5 QUINCKE YW (NEEDLE) ×1 IMPLANT
NS IRRIG 1000ML POUR BTL (IV SOLUTION) ×1 IMPLANT
PACK LAMINECTOMY NEURO (CUSTOM PROCEDURE TRAY) ×1 IMPLANT
PAD ARMBOARD 7.5X6 YLW CONV (MISCELLANEOUS) ×3 IMPLANT
PIN DISTRACTION 14MM (PIN) ×2 IMPLANT
PLATE LOCK MAXAN STD 32 2L (Plate) IMPLANT
SCREW BN 16X4XSLF DRL VA NS (Screw) IMPLANT
SCREW BONE FIXED SD 4.0X14 (Screw) IMPLANT
SCREW BONE FIXED SD 4.0X16 (Screw) ×4 IMPLANT
SOL ELECTROSURG ANTI STICK (MISCELLANEOUS)
SOLUTION ELECTROSURG ANTI STCK (MISCELLANEOUS) ×1 IMPLANT
SPACER ASSEM CERV LORD 8M (Spacer) IMPLANT
SPONGE INTESTINAL PEANUT (DISPOSABLE) ×1 IMPLANT
SPONGE SURGIFOAM ABS GEL SZ50 (HEMOSTASIS) ×1 IMPLANT
STRIP CLOSURE SKIN 1/2X4 (GAUZE/BANDAGES/DRESSINGS) ×1 IMPLANT
STRIP CLOSURE SKIN 1/4X4 (GAUZE/BANDAGES/DRESSINGS) IMPLANT
SUT VIC AB 3-0 SH 8-18 (SUTURE) ×2 IMPLANT
SUT VICRYL 4-0 PS2 18IN ABS (SUTURE) IMPLANT
TOWEL GREEN STERILE (TOWEL DISPOSABLE) ×1 IMPLANT
TOWEL GREEN STERILE FF (TOWEL DISPOSABLE) ×1 IMPLANT
WATER STERILE IRR 1000ML POUR (IV SOLUTION) ×1 IMPLANT

## 2022-08-02 NOTE — Anesthesia Postprocedure Evaluation (Signed)
Anesthesia Post Note  Patient: LEEON RADON  Procedure(s) Performed: Anterior Cervical Decompression/Discectomy Fusion - Cervical Three-Cervical Four,  Cervical Four-Cervical Five,  remove Cervical Five-Cervical Six Plate (Spine Cervical)     Patient location during evaluation: PACU Anesthesia Type: General Level of consciousness: awake and alert Pain management: pain level controlled Vital Signs Assessment: post-procedure vital signs reviewed and stable Respiratory status: spontaneous breathing, nonlabored ventilation, respiratory function stable and patient connected to nasal cannula oxygen Cardiovascular status: blood pressure returned to baseline and stable Postop Assessment: no apparent nausea or vomiting Anesthetic complications: no   No notable events documented.  Last Vitals:  Vitals:   08/02/22 1600 08/02/22 1630  BP: 138/75 (!) 149/80  Pulse: 63 66  Resp: 20 18  Temp: 36.4 C   SpO2: 93% 97%    Last Pain:  Vitals:   08/02/22 1515  TempSrc:   PainSc: Asleep                 Trevor Iha

## 2022-08-02 NOTE — Anesthesia Procedure Notes (Signed)
Procedure Name: Intubation Date/Time: 08/02/2022 12:23 PM  Performed by: Samara Deist, CRNAPre-anesthesia Checklist: Patient identified, Emergency Drugs available, Suction available and Patient being monitored Patient Re-evaluated:Patient Re-evaluated prior to induction Oxygen Delivery Method: Circle System Utilized Preoxygenation: Pre-oxygenation with 100% oxygen Induction Type: IV induction and Rapid sequence Laryngoscope Size: Glidescope and 4 Grade View: Grade II Tube type: Oral Tube size: 7.5 mm Number of attempts: 1 Airway Equipment and Method: Stylet and Oral airway Placement Confirmation: ETT inserted through vocal cords under direct vision, positive ETCO2 and breath sounds checked- equal and bilateral Secured at: 23 cm Tube secured with: Tape Dental Injury: Teeth and Oropharynx as per pre-operative assessment

## 2022-08-02 NOTE — Transfer of Care (Signed)
Immediate Anesthesia Transfer of Care Note  Patient: Anthony [name]  Procedure(s) Performed: Anterior Cervical Decompression/Discectomy Fusion - Cervical Three-Cervical Four,  Cervical Four-Cervical Five,  remove Cervical Five-Cervical Six Plate (Spine Cervical)  Patient Location: PACU  Anesthesia Type:General  Level of Consciousness: drowsy  Airway & Oxygen Therapy: Patient Spontanous Breathing and Patient connected to face mask oxygen  Post-op Assessment: Report given to RN and Post -op Vital signs reviewed and stable  Post vital signs: Reviewed and stable  Last Vitals:  Vitals Value Taken Time  BP 137/96 08/02/22 1515  Temp 36.4 C 08/02/22 1515  Pulse 64 08/02/22 1515  Resp 20 08/02/22 1515  SpO2 96 % 08/02/22 1515    Last Pain:  Vitals:   08/02/22 1515  TempSrc:   PainSc: Asleep         Complications: No notable events documented.

## 2022-08-02 NOTE — H&P (Signed)
Subjective:   Patient is a 77 y.o. male admitted for neck and arm pain. The patient first presented to me with complaints of neck pain and arm pain. Onset of symptoms was several months ago. The pain is described as aching and throbbing and occurs all day. The pain is rated severe, and is located in the neck and radiates to the LUE. The symptoms have been progressive. Symptoms are exacerbated by bending chin toward chest, and are relieved by none.  Previous work up includes MRI of cervical spine, results: spinal stenosis.  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   Neuromuscular disorder (HCC) 2023   Parkinson's Disease   Neuropathy    left leg greater than right leg   Obesity (BMI 30.0-34.9) 05/14/2013   OSA on CPAP    cpap setting of 10/ 13   Pancreatitis dx march 2016   Pneumonia 12 years ago    Past Surgical History:  Procedure Laterality Date   BACK SURGERY  10/2009   Cervical, arterior   CARPAL TUNNEL RELEASE Left 2003   CARPAL TUNNEL RELEASE Bilateral    CATARACT EXTRACTION Bilateral 01/2012   COLONOSCOPY  2024   CORONARY BALLOON ANGIOPLASTY N/A 08/28/2021   Procedure: CORONARY BALLOON ANGIOPLASTY;  Surgeon: Elder Negus, MD;  Location: [facility];  Service: Cardiovascular;  Laterality: N/A;   EUS N/A 07/15/2014   Procedure: FULL UPPER ENDOSCOPIC ULTRASOUND (EUS) RADIAL;  Surgeon: Jeani Hawking, MD;  Location: [facility];  Service: Endoscopy;  Laterality: N/A;   LEFT HEART CATH AND CORONARY ANGIOGRAPHY N/A 05/18/2019    Procedure: LEFT HEART CATH AND CORONARY ANGIOGRAPHY;  Surgeon: Yates Decamp, MD;  Location: [facility];  Service: Cardiovascular;  Laterality: N/A;   LEFT HEART CATH AND CORONARY ANGIOGRAPHY N/A 08/28/2021   Procedure: LEFT HEART CATH AND CORONARY ANGIOGRAPHY;  Surgeon: Elder Negus, MD;  Location: [facility];  Service: Cardiovascular;  Laterality: N/A;   NASAL SINUS SURGERY  1981   RETINAL Bilateral 06/2013   Retinal peel   SHOULDER SURGERY Right 2003   TONSILLECTOMY  1954    Allergies  Allergen Reactions   Lyrica [Pregabalin]     Delirium, Dizziness, Drowsy   Metformin Diarrhea    Social History   Tobacco Use   Smoking status: Never   Smokeless tobacco: Never  Substance Use Topics   Alcohol use: No    Family History  Problem Relation Age of Onset   Heart disease Mother    Diabetes Mother    Neuropathy Mother    Colon cancer Father    Diabetes Sister    Lung cancer Brother    Sleep apnea Brother    Colon cancer Brother 7   Diabetes Maternal Grandmother    Heart attack Maternal Grandmother    Colon cancer Maternal Grandfather    Heart Problems Maternal Grandfather    Prostate  cancer Maternal Grandfather    Cancer Maternal Grandfather        right eye   Heart attack Maternal Grandfather    Stomach cancer Neg Hx    Ulcerative colitis Neg Hx    Esophageal cancer Neg Hx    Parkinson's disease Neg Hx    Prior to Admission medications   Medication Sig Start Date End Date Taking? Authorizing Provider  acetaminophen (TYLENOL) 500 MG tablet Take 500 mg by mouth every 6 (six) hours as needed for moderate pain.   Yes [provider]  albuterol (VENTOLIN HFA) 108 (90 Base) MCG/ACT inhaler Inhale 1 puff into the lungs every 4 (four) hours as needed for wheezing or shortness of breath.   Yes [provider]  allopurinol (ZYLOPRIM) 300 MG tablet Take 300 mg by mouth daily.  05/11/13  Yes [provider]  Alogliptin Benzoate 25 MG  TABS Take 25 mg by mouth daily.   Yes [provider]  amLODipine (NORVASC) 5 MG tablet Take 5 mg by mouth daily.   Yes [provider]  aspirin 81 MG EC tablet TAKE 1 TABLET (81 MG TOTAL) BY MOUTH DAILY. SWALLOW WHOLE. 09/14/20  Yes Patwardhan, Manish J, MD  busPIRone (BUSPAR) 10 MG tablet Take 5 mg by mouth 2 (two) times daily.   Yes [provider]  Capsaicin 0.05 % CREA Apply 1 application  topically at bedtime as needed (neuropathy in feet).   Yes [provider]  carbidopa-levodopa (SINEMET IR) 25-100 MG tablet TAKE 1 TABLET BY MOUTH THREE TIMES A DAY 02/21/22  Yes Butch Penny, NP  carboxymethylcellulose (REFRESH PLUS) 0.5 % SOLN Place 1 drop into both eyes 4 (four) times daily.   Yes [provider]  Carboxymethylcellulose Sod PF 0.5 % SOLN Apply to eye. 03/18/22  Yes [provider]  colchicine 0.6 MG tablet Take 1.2 mg by mouth daily as needed (for gout).   Yes [provider]  divalproex (DEPAKOTE ER) 500 MG 24 hr tablet Take 500 mg by mouth at bedtime.   Yes [provider]  DULoxetine (CYMBALTA) 30 MG capsule Take 30 mg by mouth daily.   Yes [provider]  empagliflozin (JARDIANCE) 25 MG TABS tablet Take 25 mg by mouth daily.   Yes [provider]  esomeprazole (NEXIUM) 40 MG capsule Take 40 mg by mouth 2 (two) times daily.   Yes [provider]  famotidine (PEPCID) 40 MG tablet Take 40 mg by mouth daily as needed (gerd).   Yes [provider]  fluticasone (FLONASE) 50 MCG/ACT nasal spray Place 1 spray into both nostrils daily as needed for allergies or rhinitis.   Yes [provider]  furosemide (LASIX) 40 MG tablet Take 40 mg by mouth daily.   Yes [provider]  gabapentin (NEURONTIN) 100 MG capsule Take 200 mg by mouth at bedtime. Take with 600 mg tablet   Yes [provider]  gabapentin (NEURONTIN) 600 MG tablet Take 600 mg by mouth 3 (three)  times daily.   Yes [provider]  Homeopathic Products [facility] RELIEF EX) Apply 1 Application topically daily as needed (leg cramps).   Yes [provider]  insulin glargine-yfgn (SEMGLEE) 100 UNIT/ML Pen Inject 42 Units into the skin at bedtime. 06/06/21  Yes [provider]  isosorbide mononitrate (IMDUR) 60 MG 24 hr tablet TAKE 1 TABLET BY MOUTH EVERY DAY 02/21/21  Yes Cantwell, Celeste C, PA-C  ketoconazole (NIZORAL) 2 % cream Apply 1 application  topically daily as needed for irritation (yeast irritation).  03/02/19  Yes [provider]  lisinopril (ZESTRIL) 40 MG tablet Take 40 mg by mouth daily.   Yes [provider]  metoprolol succinate (TOPROL-XL) 100 MG 24 hr tablet Take 100 mg by mouth daily. Take with or immediately following a meal.   Yes [provider]  metroNIDAZOLE (METROGEL) 0.75 % gel Apply topically. 10/29/19  Yes [provider]  Mineral Oil 50 % EMUL Apply to eye. 03/18/22  Yes [provider]  mirabegron ER (MYRBETRIQ) 50 MG TB24 tablet Take 50 mg by mouth daily.   Yes [provider]  oxybutynin (DITROPAN-XL) 10 MG 24 hr tablet Take 10 mg by mouth daily.   Yes [provider]  pantoprazole (PROTONIX) 40 MG tablet Take protonix 40 mg twice daily for 2 months.  Resume once per day after that. Patient taking differently: Take 40 mg by mouth daily. 11/23/19  Yes Mansouraty, Netty Starring., MD  polyethylene glycol (MIRALAX / GLYCOLAX) 17 g packet Take 17 g by mouth daily as needed for mild constipation or moderate constipation.   Yes [provider]  pyridOXINE (VITAMIN B-6) 100 MG tablet Take 100 mg by mouth daily.   Yes [provider]  rosuvastatin (CRESTOR) 40 MG tablet Take 1 tablet (40 mg total) by mouth daily. 11/29/21  Yes Patwardhan, Manish J, MD  Sodium Fluoride 1.1 % PSTE Place 1 Application onto teeth 2 (two) times daily.   Yes [provider]  solifenacin  (VESICARE) 5 MG tablet Take 5 mg by mouth. 07/20/22  Yes [provider]  SYNTHROID 25 MCG tablet Take 25 mcg by mouth daily before breakfast. 05/11/13  Yes [provider]  tamsulosin (FLOMAX) 0.4 MG CAPS capsule Take 0.4 mg by mouth every evening.  02/16/13  Yes [provider]  traZODone (DESYREL) 50 MG tablet Take 50 mg by mouth at bedtime.   Yes [provider]  Rayburn Ma Oil (SOOTHE NIGHTTIME) OINT Apply 1 Application to eye at bedtime.   Yes [provider]  Investigational - Study Medication Inject 1 Dose as directed every 6 (six) months. Study name: Lepodisiran on the Reduction of MACE w/ Elevated Lp(a) in Established CVD or High Risk for CVD (J3L-MC-EZEF-ACCLAIM-Lp(a))  Additional study details: [employer] [phone]    [provider]  Multiple Vitamin (MULTIVITAMIN) capsule Take 1 capsule by mouth daily. 10/29/19   [provider]  nitroGLYCERIN (NITROSTAT) 0.4 MG SL tablet Place 0.4 mg under the tongue every 5 (five) minutes as needed for chest pain.    [provider]     Review of Systems  Positive ROS: neg  All other systems have been reviewed and were otherwise negative with the exception of those mentioned in the HPI and as above.  Objective: Vital signs in last 24 hours: Temp:  [98.4 F (36.9 C)] 98.4 F (36.9 C) (07/12 1110) Pulse Rate:  [69] 69 (07/12 1110) Resp:  [18] 18 (07/12 1110) BP: (144)/(73) 144/73 (07/12 1110) SpO2:  [96 %] 96 % (07/12 1110) Weight:  [108.9 kg] 108.9 kg (07/12 1110)  General Appearance: Alert, cooperative, no distress, appears stated age Head: Normocephalic, without obvious abnormality, atraumatic Eyes: PERRL, conjunctiva/corneas clear, EOM's intact      Neck: Supple, symmetrical, trachea midline, Back: Symmetric, no curvature, ROM normal, no CVA tenderness Lungs:  respirations unlabored Heart: Regular rate and rhythm Abdomen: Soft,  non-tender Extremities: Extremities normal, atraumatic, no cyanosis or edema Pulses:  2+ and symmetric all extremities Skin: Skin color, texture, turgor normal, no rashes or lesions  NEUROLOGIC:  Mental status: Alert and oriented x4, no aphasia, good attention span, fund of knowledge and memory  Motor Exam - grossly normal Sensory Exam - grossly normal Reflexes: 1+ Coordination - grossly normal Gait - grossly normal Balance - grossly normal Cranial Nerves: I: smell Not tested  II: visual acuity  OS: nl    OD: nl  II: visual fields Full to confrontation  II: pupils Equal, round, reactive to light  III,VII: ptosis None  III,IV,VI: extraocular muscles  Full ROM  V: mastication Normal  V: facial light touch sensation  Normal  V,VII: corneal reflex  Present  VII: facial muscle function - upper  Normal  VII: facial muscle function - lower Normal  VIII: hearing Not tested  IX: soft palate elevation  Normal  IX,X: gag reflex Present  XI: trapezius strength  5/5  XI: sternocleidomastoid strength 5/5  XI: neck flexion strength  5/5  XII: tongue strength  Normal    Data Review Lab Results  Component Value Date   WBC 8.8 07/22/2022   HGB 14.9 07/22/2022   HCT 46.5 07/22/2022   MCV 90.5 07/22/2022   PLT 250 07/22/2022   Lab Results  Component Value Date   NA 135 07/22/2022   K 4.4 07/22/2022   CL 101 07/22/2022   CO2 25 07/22/2022   BUN 11 07/22/2022   CREATININE 1.09 07/22/2022   GLUCOSE 151 (H) 07/22/2022   Lab Results  Component Value Date   INR 0.9 07/22/2022    Assessment:   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 Estimated body mass index is 34.44 kg/m as calculated from the following:   Height as of this encounter: 5\' 10"  (1.778 m).   Weight as of this encounter: 108.9 kg.  Patient has failed conservative therapy. Planned surgery : ACDF C3-4 c4-5, removal plate Z6-1  Plan:   I explained the condition and  procedure to the patient and answered any questions.  Patient wishes to proceed with procedure as planned. Understands risks/ benefits/ and expected or typical outcomes.  Tia Alert 08/02/2022 11:44 AM

## 2022-08-02 NOTE — Op Note (Signed)
08/02/2022  3:08 PM  PATIENT:  Anthony [name]  77 y.o. 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 Decompressive anterior cervical discectomy C3-4 and C4-5, 2. Anterior cervical arthrodesis C3-4 and C4-5 utilizing a 8 mm structural allograft at each level, 3. Anterior cervical plating C3-C5 inclusive utilizing a Maxan plate, 4.  Removal of anterior cervical plate Z6-1  SURGEON:  Marikay Alar, MD  ASSISTANTS: Verlin Dike, FNP  ANESTHESIA:   General  EBL: 30 ml  No intake/output data recorded.  BLOOD ADMINISTERED: none  DRAINS: none  SPECIMEN:  none  INDICATION FOR PROCEDURE: This patient presented with neck pain and left arm pain. Imaging showed Jason level spondylosis with stenosis C3-4 and C4-5. The patient tried conservative measures without relief. Pain was debilitating. Recommended ACDF with plating. Patient understood the risks, benefits, and alternatives and potential outcomes and wished to proceed.  PROCEDURE DETAILS: Patient was brought to the operating room placed under general endotracheal anesthesia. Patient was placed in the supine position on the operating room table. The neck was prepped with Duraprep and draped in a sterile fashion.   Three cc of local anesthesia was injected and a transverse incision was made on the right side of the neck.  Dissection was carried down thru the subcutaneous tissue and the platysma was  elevated, opened, and undermined with Metzenbaum scissors.  Dissection was then carried out thru an avascular plane leaving the sternocleidomastoid carotid artery and jugular vein laterally and the trachea and esophagus medially with the assistance of my nurse practitioner. The ventral aspect of the vertebral column was identified and a localizing x-ray was taken. The C3-4 and C4-5 level was  identified and all in the room agreed with the level.  We dissected over the previously placed C5-6 plate and remove the screws and remove the plate and dried the holes with Surgifoam.  The longus colli muscles were then elevated and the retractor was placed with the assistance of my nurse practitioner. The annulus was incised C3-4 and C4-5 and the disc space entered. Discectomy was performed with micro-curettes and pituitary rongeurs. I then used the high-speed drill to drill the endplates down to the level of the posterior longitudinal ligament.  The operating microscope was draped and brought into the field provided additional magnification, illumination and visualization. Discectomy was continued posteriorly thru the disc space. Posterior longitudinal ligament was opened with a nerve hook, and then removed along with disc herniation and osteophytes, decompressing the spinal canal and thecal sac. We then continued to remove osteophytic overgrowth and disc material decompressing the neural foramina and exiting nerve roots bilaterally. The scope was angled up and down to help decompress and undercut the vertebral bodies. Once the decompression was completed we could pass a nerve hook circumferentially to assure adequate decompression in the midline and in the neural foramina. So by both visualization and palpation we felt we had an adequate decompression of the neural elements. We then measured the height of the intravertebral disc space and selected a 8 structural allograft. It was then gently positioned in the intravertebral disc space(s) and countersunk. I then used a  Maxan plate and placed 16 mm variable angle screws into the vertebral bodies of each level and locked them into position. The wound was irrigated with bacitracin solution, checked for hemostasis which was established and confirmed. Once meticulous hemostasis was  achieved, we then proceeded with closure with the assistance of my nurse practitioner.  The platysma was closed with interrupted 3-0 undyed Vicryl suture, the subcuticular layer was closed with interrupted 3-0 undyed Vicryl suture. The skin edges were approximated with steristrips. The drapes were removed. A sterile dressing was applied. The patient was then awakened from general anesthesia and transferred to the recovery room in stable condition. At the end of the procedure all sponge, needle and instrument counts were correct.   PLAN OF CARE: Admit for overnight observation  PATIENT DISPOSITION:  PACU - hemodynamically stable.   Delay start of Pharmacological VTE agent (>24hrs) due to surgical blood loss or risk of bleeding:  yes

## 2022-08-03 DIAGNOSIS — M4722 Other spondylosis with radiculopathy, cervical region: Secondary | ICD-10-CM | POA: Diagnosis not present

## 2022-08-03 LAB — GLUCOSE, CAPILLARY: Glucose-Capillary: 98 mg/dL (ref 70–99)

## 2022-08-03 MED ORDER — METHOCARBAMOL 500 MG PO TABS: 500.0000 mg | ORAL_TABLET | Freq: Four times a day (QID) | ORAL | 0 refills | Status: AC | PRN

## 2022-08-03 MED ORDER — HYDROCODONE-ACETAMINOPHEN 5-325 MG PO TABS: 1.0000 | ORAL_TABLET | ORAL | 0 refills | Status: DC | PRN

## 2022-08-03 NOTE — Evaluation (Signed)
Occupational Therapy Evaluation and Discharge Patient Details Name: Anthony [name] MRN: [id number] DOB: 24-Mar-1945 Today's Date: 08/03/2022   History of Present Illness Pt is a 77 y/o male who presented 08/02/22 for planned ACDF C 3-4-5, and removal of C5-6 plate after severe pain radiating from neck and down LUE.  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   Clinical Impression   Pt is typically independent in ADL and mobility. His LUE (dominant) has been so painful it has impacted his daily life and really limited him. Today, post-op he reports he feels "Like a new man" LUE is generally 5/5 with MMT as well as ROM to 90 FF and ABduction at shoulder, elbow and hand WFL. Cervical precaution handout provided and reviewed in full including compensatory strategies and AE for ADL. Please see ADL section below for full details. Pt overall at supervision level due to hospital setting but did not need physical assist for any aspects of ADL.  During stairs, with very first initial step he did have 1 self-corrected LOB and should have close min guard for stairs initally at home. Pt verbalized understanding of education, had no questions or concerns at end of session, and OT will sign off at this time.   Of note: OT and Pt did have conversation about post-acute follow up specifically concerning his LUE and making sure it achieves full functional capacity - due to dramatic improvement we are in agreement that he will continue to use it functionally and if he notices trouble or weakness then he will ask for follow up therapy at 2 week follow up with Dr Yetta Barre.      Recommendations for follow up therapy are one component of a multi-disciplinary discharge planning process, led by the attending  physician.  Recommendations may be updated based on patient status, additional functional criteria and insurance authorization.   Assistance Recommended at Discharge Set up Supervision/Assistance  Patient can return home with the following Assistance with cooking/housework;Assist for transportation;Help with stairs or ramp for entrance    Functional Status Assessment  Patient has had a recent decline in their functional status and demonstrates the ability to make significant improvements in function in a reasonable and predictable amount of time.  Equipment Recommendations  None recommended by OT    Recommendations for Other Services       Precautions / Restrictions Precautions Precautions: Cervical Precaution Booklet Issued: Yes (comment) Precaution Comments: reviewed in full Required Braces or Orthoses: Cervical Brace Cervical Brace: Soft collar;For comfort Restrictions Weight Bearing Restrictions: No      Mobility Bed Mobility Overal bed mobility: Modified Independent             General bed mobility comments: good log roll technique    Transfers Overall transfer level: Modified independent Equipment used: None                      Balance Overall balance assessment: Mild deficits observed, not formally tested (small self-corrected LOB initially on stair attempt, then able to complete full flight of stairs without LOB at min guard)                                         ADL either performed or assessed with  clinical judgement   ADL Overall ADL's : Modified independent                                       General ADL Comments: educated on compensatory strategies for ADL and reviewed cervical handout in full. Pt reporting GREAT improvemnt in LUE/hand to assist with performing these tasks as it is his dominant hand. Pt able to dress UB and LB, don soft brace, perform sink level grooming, educated on toilet transfer and  peri care (managed own belt) Pt has long handle sponge and is able to perform figure 4 but verbally educated on AE options. Pt overall supervision level for ADL of UB and LB and transfers due to hospital setting - no physical assist needed after education.     Vision Baseline Vision/History: 1 Wears glasses Ability to See in Adequate Light: 0 Adequate Patient Visual Report: No change from baseline Vision Assessment?: No apparent visual deficits     Perception     Praxis      Pertinent Vitals/Pain Pain Assessment Pain Assessment: 0-10 Pain Score: 2  Pain Location: incision site and LUE (imprved from 8/10 prior to surgery) Pain Descriptors / Indicators: Discomfort, Constant Pain Intervention(s): Monitored during session, Repositioned     Hand Dominance Left   Extremity/Trunk Assessment Upper Extremity Assessment Upper Extremity Assessment: Overall WFL for tasks assessed;LUE deficits/detail LUE Deficits / Details: grasp 5/5, full ROM and strength at eblow and up to 90 FF and Abduction at shoulder LUE Coordination: WNL (Pt reports amazing improvement)       Cervical / Trunk Assessment Cervical / Trunk Assessment: Neck Surgery   Communication Communication Communication: No difficulties   Cognition Arousal/Alertness: Awake/alert Behavior During Therapy: WFL for tasks assessed/performed Overall Cognitive Status: Within Functional Limits for tasks assessed                                       General Comments  after stairs and hallway mobility, Pt SpO2 was 89, after PLB it returned to 92 and over.    Exercises     Shoulder Instructions      Home Living Family/patient expects to be discharged to:: Private residence Living Arrangements: Spouse/significant other Available Help at Discharge: Family;Available 24 hours/day Type of Home: House Home Access: Stairs to enter [employer] of Steps: 2   Home Layout: One level     Bathroom  Shower/Tub:  (walk in tub)   Bathroom Toilet: Standard     Home Equipment: Agricultural consultant (2 wheels);Shower seat - built in;Hand held shower head;Adaptive equipment (bed with adjustable/elevating head) Adaptive Equipment: Long-handled sponge Additional Comments: just finished bout of PT specific for Parkinsons      Prior Functioning/Environment Prior Level of Function : Independent/Modified Independent             Mobility Comments: finished intensive Parkinsons Therapy at outpatient within the last 6 months ADLs Comments: modified independent        OT Problem List: Decreased strength;Impaired balance (sitting and/or standing);Decreased knowledge of use of DME or AE;Impaired UE functional use;Pain      OT Treatment/Interventions:      OT Goals(Current goals can be found in the care plan section) Acute Rehab OT Goals Patient Stated Goal: be as independent as possible, travel with wife OT Goal Formulation: With  patient Time For Goal Achievement: 08/17/22 Potential to Achieve Goals: Good  OT Frequency:      Co-evaluation              AM-PAC OT "6 Clicks" Daily Activity     Outcome Measure Help from another person eating meals?: None Help from another person taking care of personal grooming?: None Help from another person toileting, which includes using toliet, bedpan, or urinal?: None Help from another person bathing (including washing, rinsing, drying)?: A Little Help from another person to put on and taking off regular upper body clothing?: None Help from another person to put on and taking off regular lower body clothing?: None 6 Click Score: 23   End of Session Equipment Utilized During Treatment: Gait belt Nurse Communication: Mobility status  Activity Tolerance: Patient tolerated treatment well Patient left: in chair;with call bell/phone within reach  OT Visit Diagnosis: Muscle weakness (generalized) (M62.81);Other symptoms and signs involving the  nervous system (R29.898);Pain Pain - Right/Left: Left Pain - part of body: Arm                Time: 8841-6606 OT Time Calculation (min): 35 min Charges:  OT General Charges $OT Visit: 1 Visit OT Evaluation $OT Eval Low Complexity: 1 Low  Nyoka Cowden OTR/L Acute Rehabilitation Services Office: [phone]  Evern Bio [facility] 08/03/2022, 10:37 AM

## 2022-08-03 NOTE — Discharge Instructions (Signed)
Wound Care Keep incision area dry.  You may remove outer bandage after 2 days and shower.  Do not put any creams, lotions, or ointments on incision. Leave steri-strips on neck.  They will fall off by themselves. Activity Walk each and every day, increasing distance each day. No lifting greater than 5 lbs.  Avoid excessive neck motion. No driving for 2 weeks; may ride as a passenger locally. Wear neck brace at all times except when showering. Diet Resume your normal diet.  Return to Work Will be discussed at you follow up appointment. Call Your Doctor If Any of These Occur Redness, drainage, or swelling at the wound.  Temperature greater than 101 degrees. Severe pain not relieved by pain medication. Increased difficulty swallowing.  Incision starts to come apart. Follow Up Appt Call for appointment in 1-2 weeks ([phone]) or for problems.  If you have any hardware placed in your spine, you will need an x-ray before your appointment.

## 2022-08-03 NOTE — Progress Notes (Signed)
Orthopedic Tech Progress Note Patient Details:  Anthony [name] October 25, 1945 [id number]  Ortho Devices Type of Ortho Device: Soft collar Ortho Device/Splint Interventions: Application   Post Interventions Patient Tolerated: Well  [name] Anthony [name] 08/03/2022, 9:29 AM

## 2022-08-03 NOTE — Progress Notes (Signed)
Patient is discharged from room 3C09 at this time. Alert and in stable condition. IV site d/c'd and instructions read to patient and spouse with understanding verbalized and all questions answered. Left unit via wheelchair with all belongings at side. 

## 2022-08-03 NOTE — Discharge Summary (Signed)
Discharge Summary  Date of Admission: 08/02/2022  Date of Discharge: 08/03/22  Attending Physician: Marikay Alar, MD  Hospital Course: Patient was admitted following an uncomplicated C3-5 ACDF. They were recovered in PACU and transferred to [facility]. Their preop symptoms were improved, their hospital course was uncomplicated and the patient was discharged home today. They will follow up in clinic with Dr. Yetta Barre in clinic in 2 weeks.  Neurologic exam at discharge:  Strength 5/5 x4 and SILTx4, incision c/d/I   Discharge diagnosis: Cervical stenosis with radiculopathy   Iran Sizer, PA-C 08/03/22 9:19 AM

## 2022-08-03 NOTE — Progress Notes (Signed)
Neurosurgery Service Progress Note  Subjective: No acute events overnight. Reports resolution of pre-op UE radiculopathy. Cervical pain as expected for procedure, but is well controlled with medications. Overall very pleased with outcomes.    Objective: Vitals:   08/02/22 1630 08/02/22 1948 08/02/22 2337 08/03/22 0249  BP: (!) 149/80 (!) 148/77 117/75 (!) 144/80  Pulse: 66 72 74 73  Resp: 18 20 20 20   Temp:  98.4 F (36.9 C) 97.9 F (36.6 C) 98 F (36.7 C)  TempSrc:  Oral Oral Oral  SpO2: 97% 95% 95% 96%  Weight:      Height:        Physical Exam: Strength 5/5 x4 and SILTx4, incision c/d/I   Assessment & Plan: 77 y.o. male s/p C3-5 ACDF, recovering well.  -PT/OT -discharge home today   Emilee Hero, PA-C 08/03/22 9:17 AM

## 2022-08-05 MED FILL — Thrombin For Soln 5000 Unit: CUTANEOUS | Qty: 2 | Status: AC

## 2022-08-09 ENCOUNTER — Encounter (HOSPITAL_COMMUNITY): Payer: Self-pay | Admitting: Neurological Surgery

## 2022-08-16 DIAGNOSIS — E1165 Type 2 diabetes mellitus with hyperglycemia: Secondary | ICD-10-CM | POA: Diagnosis not present

## 2022-08-19 ENCOUNTER — Other Ambulatory Visit: Payer: Self-pay | Admitting: Registered Nurse

## 2022-08-19 DIAGNOSIS — H9201 Otalgia, right ear: Secondary | ICD-10-CM | POA: Diagnosis not present

## 2022-08-19 DIAGNOSIS — R42 Dizziness and giddiness: Secondary | ICD-10-CM | POA: Diagnosis not present

## 2022-08-19 DIAGNOSIS — H6991 Unspecified Eustachian tube disorder, right ear: Secondary | ICD-10-CM | POA: Diagnosis not present

## 2022-08-20 ENCOUNTER — Ambulatory Visit
Admission: RE | Admit: 2022-08-20 | Discharge: 2022-08-20 | Disposition: A | Discharge status: Home / Self Care | Payer: PPO | Source: Ambulatory Visit | Attending: Registered Nurse | Admitting: Registered Nurse

## 2022-08-20 ENCOUNTER — Other Ambulatory Visit: Payer: Self-pay | Admitting: Registered Nurse

## 2022-08-20 ENCOUNTER — Ambulatory Visit: Admission: RE | Admit: 2022-08-20 | Payer: PPO | Source: Ambulatory Visit

## 2022-08-20 DIAGNOSIS — H9201 Otalgia, right ear: Secondary | ICD-10-CM

## 2022-08-20 DIAGNOSIS — R42 Dizziness and giddiness: Secondary | ICD-10-CM

## 2022-08-20 MED ORDER — IOPAMIDOL (ISOVUE-370) INJECTION 76%: 80.0000 mL | Freq: Once | INTRAVENOUS | Status: DC | PRN

## 2022-08-21 ENCOUNTER — Ambulatory Visit
Admission: RE | Admit: 2022-08-21 | Discharge: 2022-08-21 | Disposition: A | Discharge status: Home / Self Care | Payer: PPO | Source: Ambulatory Visit | Attending: Registered Nurse | Admitting: Registered Nurse

## 2022-08-21 DIAGNOSIS — R42 Dizziness and giddiness: Secondary | ICD-10-CM | POA: Diagnosis not present

## 2022-08-21 DIAGNOSIS — J019 Acute sinusitis, unspecified: Secondary | ICD-10-CM | POA: Diagnosis not present

## 2022-08-21 DIAGNOSIS — H9201 Otalgia, right ear: Secondary | ICD-10-CM | POA: Diagnosis not present

## 2022-08-21 DIAGNOSIS — I672 Cerebral atherosclerosis: Secondary | ICD-10-CM | POA: Diagnosis not present

## 2022-08-21 MED ORDER — IOPAMIDOL (ISOVUE-370) INJECTION 76%
75.0000 mL | Freq: Once | INTRAVENOUS | Status: AC | PRN
  Administered 2022-08-21: 75 mL via INTRAVENOUS

## 2022-09-16 DIAGNOSIS — E1165 Type 2 diabetes mellitus with hyperglycemia: Secondary | ICD-10-CM | POA: Diagnosis not present

## 2022-09-17 DIAGNOSIS — M5412 Radiculopathy, cervical region: Secondary | ICD-10-CM | POA: Diagnosis not present

## 2022-09-17 DIAGNOSIS — Z6832 Body mass index (BMI) 32.0-32.9, adult: Secondary | ICD-10-CM | POA: Diagnosis not present

## 2022-09-19 ENCOUNTER — Telehealth: Payer: Self-pay | Admitting: Physical Therapy

## 2022-09-19 ENCOUNTER — Ambulatory Visit: Payer: PPO | Attending: Neurology | Admitting: Physical Therapy

## 2022-09-19 DIAGNOSIS — R2689 Other abnormalities of gait and mobility: Secondary | ICD-10-CM

## 2022-09-19 DIAGNOSIS — H903 Sensorineural hearing loss, bilateral: Secondary | ICD-10-CM | POA: Diagnosis not present

## 2022-09-19 DIAGNOSIS — H9201 Otalgia, right ear: Secondary | ICD-10-CM | POA: Diagnosis not present

## 2022-09-19 NOTE — Therapy (Signed)
[facility] [address] [place], [place], [address] Phone: [phone]   Fax:  [phone]  Patient Details  Name: THADEUS LUNDHOLM MRN: [id number] Date of Birth: 04/05/1945 Referring Provider:  Gaspar Garbe, MD  Encounter Date: 09/19/2022  Physical Therapy Parkinson's Disease Screen   Timed Up and Go test:12.91 sec (compared to 12.37 sec)  10 meter walk test:11.15 sec = 2.94 ft/sec (compared to 3.1 ft/sec)  5 time sit to stand test:15.50 sec with one episode of posterior lean (compared to 12.84 sec)  Patient would benefit from Physical Therapy evaluation due to pt's reports of decreased balance and increased slowing of mobility recently.  Have just gotten over neck fusion surgery.  Reports no falls, sometimes get feet caught on carpet.  Pt does report noticing some slowing and times of being off-balance.  Loris Winrow W., PT 09/19/2022, 7:56 AM  [place] [facility] [address] [place], [place], [address] Phone: [phone]   Fax:  [phone]

## 2022-09-19 NOTE — Telephone Encounter (Signed)
Anthony [name] was seen for PT screen in our multi-disciplinary clinic.  Physical therapy is recommended for follow up due to reported balance deficits.  If you agree, please send referral via Epic for physical therapy eval and treat.  Thank you.  Lonia Blood, PT 09/19/22 12:47 PM Phone: [phone] Fax: [phone]  [facility] [address] [place], [address] [place], [place] [address] Phone # [phone] Fax # [phone]

## 2022-09-25 ENCOUNTER — Other Ambulatory Visit: Payer: Self-pay | Admitting: Cardiology

## 2022-09-25 DIAGNOSIS — I25118 Atherosclerotic heart disease of native coronary artery with other forms of angina pectoris: Secondary | ICD-10-CM

## 2022-09-25 DIAGNOSIS — E782 Mixed hyperlipidemia: Secondary | ICD-10-CM

## 2022-10-10 DIAGNOSIS — E7800 Pure hypercholesterolemia, unspecified: Secondary | ICD-10-CM | POA: Diagnosis not present

## 2022-10-10 DIAGNOSIS — E1140 Type 2 diabetes mellitus with diabetic neuropathy, unspecified: Secondary | ICD-10-CM | POA: Diagnosis not present

## 2022-10-10 DIAGNOSIS — Z794 Long term (current) use of insulin: Secondary | ICD-10-CM | POA: Diagnosis not present

## 2022-10-10 DIAGNOSIS — I119 Hypertensive heart disease without heart failure: Secondary | ICD-10-CM | POA: Diagnosis not present

## 2022-10-16 DIAGNOSIS — D692 Other nonthrombocytopenic purpura: Secondary | ICD-10-CM | POA: Diagnosis not present

## 2022-10-16 DIAGNOSIS — M109 Gout, unspecified: Secondary | ICD-10-CM | POA: Diagnosis not present

## 2022-10-16 DIAGNOSIS — E785 Hyperlipidemia, unspecified: Secondary | ICD-10-CM | POA: Diagnosis not present

## 2022-10-16 DIAGNOSIS — Z1212 Encounter for screening for malignant neoplasm of rectum: Secondary | ICD-10-CM | POA: Diagnosis not present

## 2022-10-16 DIAGNOSIS — Z1389 Encounter for screening for other disorder: Secondary | ICD-10-CM | POA: Diagnosis not present

## 2022-10-16 DIAGNOSIS — E1140 Type 2 diabetes mellitus with diabetic neuropathy, unspecified: Secondary | ICD-10-CM | POA: Diagnosis not present

## 2022-10-16 DIAGNOSIS — E039 Hypothyroidism, unspecified: Secondary | ICD-10-CM | POA: Diagnosis not present

## 2022-10-17 ENCOUNTER — Ambulatory Visit: Payer: PPO | Attending: Neurology

## 2022-10-17 DIAGNOSIS — E1165 Type 2 diabetes mellitus with hyperglycemia: Secondary | ICD-10-CM | POA: Diagnosis not present

## 2022-10-17 DIAGNOSIS — R2681 Unsteadiness on feet: Secondary | ICD-10-CM | POA: Insufficient documentation

## 2022-10-17 DIAGNOSIS — M6281 Muscle weakness (generalized): Secondary | ICD-10-CM | POA: Diagnosis not present

## 2022-10-17 DIAGNOSIS — R2689 Other abnormalities of gait and mobility: Secondary | ICD-10-CM | POA: Insufficient documentation

## 2022-10-17 NOTE — Therapy (Signed)
OUTPATIENT PHYSICAL THERAPY NEURO EVALUATION   Patient Name: Anthony [name] MRN: [id number] DOB:07/03/1945, 77 y.o., male Today's Date: 10/17/2022   PCP: Gaspar Garbe, MD REFERRING PROVIDER: Butch Penny, NP  END OF SESSION:  PT End of Session - 10/17/22 1609     Visit Number 1    Number of Visits 6    Date for PT Re-Evaluation 11/28/22    Authorization Type [employer]    PT Start Time 1615    PT Stop Time 1700    PT Time Calculation (min) 45 min             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   Neuromuscular disorder (HCC) 2023   Parkinson's Disease   Neuropathy    left leg greater than right leg   Obesity (BMI 30.0-34.9) 05/14/2013   OSA on CPAP    cpap setting of 10/ 13   Pancreatitis dx march 2016   Pneumonia 12 years ago   Past Surgical History:  Procedure Laterality Date   ANTERIOR CERVICAL DECOMP/DISCECTOMY FUSION N/A 08/02/2022   Procedure: Anterior Cervical Decompression/Discectomy Fusion - Cervical Three-Cervical Four,  Cervical Four-Cervical Five,  remove Cervical Five-Cervical Six Plate;  Surgeon: Arman Bogus, MD;  Location: [facility] OR;  Service: Neurosurgery;  Laterality: N/A;  3C   BACK SURGERY  10/2009   Cervical, arterior   CARPAL TUNNEL RELEASE Left 2003   CARPAL TUNNEL RELEASE Bilateral    CATARACT EXTRACTION Bilateral 01/2012   COLONOSCOPY  2024   CORONARY BALLOON ANGIOPLASTY N/A 08/28/2021   Procedure: CORONARY BALLOON ANGIOPLASTY;  Surgeon: Elder Negus, MD;   Location: [facility];  Service: Cardiovascular;  Laterality: N/A;   EUS N/A 07/15/2014   Procedure: FULL UPPER ENDOSCOPIC ULTRASOUND (EUS) RADIAL;  Surgeon: Jeani Hawking, MD;  Location: [facility];  Service: Endoscopy;  Laterality: N/A;   LEFT HEART CATH AND CORONARY ANGIOGRAPHY N/A 05/18/2019   Procedure: LEFT HEART CATH AND CORONARY ANGIOGRAPHY;  Surgeon: Yates Decamp, MD;  Location: [facility];  Service: Cardiovascular;  Laterality: N/A;   LEFT HEART CATH AND CORONARY ANGIOGRAPHY N/A 08/28/2021   Procedure: LEFT HEART CATH AND CORONARY ANGIOGRAPHY;  Surgeon: Elder Negus, MD;  Location: [facility];  Service: Cardiovascular;  Laterality: N/A;   NASAL SINUS SURGERY  1981   RETINAL Bilateral 06/2013   Retinal peel   SHOULDER SURGERY Right 2003   TONSILLECTOMY  1954   Patient Active Problem List   Diagnosis Date Noted   S/P cervical spinal fusion 08/02/2022   Parkinsonism 12/06/2021   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/06/2021   Hyperkalemia 12/06/2020   Coronary artery disease of native artery of native heart with stable angina pectoris (HCC) 12/03/2019   Esophageal dysphagia 11/21/2019   Gastroesophageal reflux disease 11/21/2019   Family history of colon cancer 11/21/2019   Abnormal stress test  05/11/2019   Mixed hyperlipidemia 05/11/2019   Angina pectoris (HCC) 08/07/2018   Essential hypertension 08/07/2018   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  Rationale for Evaluation and Treatment:  Rehabilitation  SUBJECTIVE:                                                                                                                                                                                             SUBJECTIVE STATEMENT: Worsening of Parkinson's symptoms. Of note, underwent ACDF x 5 and reports improved LUE pain since that time.  Continuing to have some right cervical column pain. 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  Notes that LUE has worsening tremor and worsening of freezing of gait during turns  Pt accompanied by: self  PERTINENT HISTORY: DM, ACDF x 26 July 2022  PAIN:  Are you having pain? Yes: NPRS scale: 7/10 Pain location: right lateral neck Pain description: aching, popping Aggravating factors: right rotation Relieving factors: heating pad  PRECAUTIONS: None  RED FLAGS: None   WEIGHT BEARING RESTRICTIONS: No  FALLS: Has patient fallen in last 6 months? No  LIVING ENVIRONMENT: Lives with: lives with their family and lives with their spouse Lives in: House/apartment Stairs:  back steps from carport 4 stairs bilat HR Has following equipment at home: None  PLOF: Independent  PATIENT GOALS: Improve balance and mobility  OBJECTIVE:   DIAGNOSTIC FINDINGS: N/A for episode  COGNITION: Overall cognitive status: Within functional limits for tasks assessed   SENSATION: Neuropathy in bilat LE to mid calf and notes some affecting LUE > RUE  COORDINATION: Difficulty with rapid alternating movements  EDEMA:  none  MUSCLE TONE: NT    POSTURE: forward head and c-spine ROM limited  LOWER EXTREMITY ROM:   WFL  C-Spine ROM: Flexion 25 deg Extension 30 deg Right lateral flexion: 15 deg Left lateral flexion: 25 deg Right rotation: 15 deg Left rotation: 30 deg  --palpation unrevealing  LOWER EXTREMITY MMT:    Grossly 5/5 BLE  BED MOBILITY:  indep  TRANSFERS: Assistive device utilized: None  Sit to stand: Complete  Independence Stand to sit: Complete Independence Chair to chair: Complete  Independence Floor:  NT    CURB:  Level of Assistance: Complete Independence Assistive device utilized: None Curb Comments:   STAIRS: NT  GAIT: Gait pattern:  left foot slap at initial contact and wide BOS Distance walked:  Assistive device utilized: None Level of assistance: Complete Independence Comments:   FUNCTIONAL TESTS:  5 times sit to stand: 17 sec Mini-BESTest: 17/28  Gait speed: 14.28 sec = 2.29 ft/sec Freezing of Gait Questionnaire: 13/24     PATIENT EDUCATION: Education details: assessment details, rationale of PT intervention Person educated: Patient Education method: Explanation Education comprehension: verbalized understanding  HOME EXERCISE PROGRAM: TBD  GOALS: Goals reviewed with patient? Yes  SHORT TERM GOALS: Target date: 11/07/2022    Patient will be independent in HEP to improve functional outcomes and to address decreased cervical spine AROM Baseline: Goal status: INITIAL  2.  Teach-back relevant strategies for freezing of gait Baseline: FoG Questionnaire 13/24 Goal status: INITIAL  3. Decrease neck pain to 3/10 with rotation to improve comfort and safety with mobility  Baseline: 7/10  Goal status: INITIAL  LONG TERM GOALS: Target date: 11/28/2022    Demo improved motor control and lower risk for falls per score 20/28 Mini-BESTest Baseline: 17/28 Goal status: INITIAL  2.  Manifest improved balance and LE strength per time 15 sec 5xSTS test Baseline: 17 sec w/ retro-LOB Goal status: INITIAL  3.  Report understanding of relevant community-level groups/classes for those with PD Baseline:  Goal status: INITIAL  4.  Demo improved gait speed to 2.8 ft/sec to improve efficiency of community ambulation Baseline: 2.29 ft/sec Goal status: INITIAL    ASSESSMENT:  CLINICAL IMPRESSION: Patient is a 77 y.o. male who was seen today for physical therapy  evaluation and treatment for abnormalities of gait and mobility.  Demonstrates deficits in motor control and balance with report of freezing of gait and exhibits high risk for falls per outcome measures.  Patient would benefit from PT interventions to address deficits and limitations to reduce risk for falls.    OBJECTIVE IMPAIRMENTS: Abnormal gait, decreased activity tolerance, decreased balance, decreased coordination, decreased mobility, difficulty walking, decreased ROM, decreased strength, hypomobility, postural dysfunction, and pain.   ACTIVITY LIMITATIONS: lifting, bending, transfers, and locomotion level  PARTICIPATION LIMITATIONS: cleaning, laundry, driving, community activity, yard work, and exercise routine  PERSONAL FACTORS: Age, Time since onset of injury/illness/exacerbation, and 3+ comorbidities: PMH/cervical ACDF x 5  are also affecting patient's functional outcome.   REHAB POTENTIAL: Good  CLINICAL DECISION MAKING: Evolving/moderate complexity  EVALUATION COMPLEXITY: Moderate  PLAN:  PT FREQUENCY: 1x/week  PT DURATION: 6 weeks  PLANNED INTERVENTIONS: Therapeutic exercises, Therapeutic activity, Neuromuscular re-education, Balance training, Gait training, Patient/Family education, Self Care, Joint mobilization, Stair training, Vestibular training, Canalith repositioning, DME instructions, Aquatic Therapy, Dry Needling, Electrical stimulation, Spinal mobilization, Cryotherapy, Moist heat, and Manual therapy  PLAN FOR NEXT SESSION: initiate balance HEP   5:25 PM, 10/17/22 M. Shary Decamp, PT, DPT Physical Therapist- [place] Office Number: [phone]

## 2022-10-23 DIAGNOSIS — Z23 Encounter for immunization: Secondary | ICD-10-CM | POA: Diagnosis not present

## 2022-10-23 DIAGNOSIS — I119 Hypertensive heart disease without heart failure: Secondary | ICD-10-CM | POA: Diagnosis not present

## 2022-10-23 DIAGNOSIS — E7800 Pure hypercholesterolemia, unspecified: Secondary | ICD-10-CM | POA: Diagnosis not present

## 2022-10-23 DIAGNOSIS — Z0000 Encounter for general adult medical examination without abnormal findings: Secondary | ICD-10-CM | POA: Diagnosis not present

## 2022-10-23 DIAGNOSIS — Z1339 Encounter for screening examination for other mental health and behavioral disorders: Secondary | ICD-10-CM | POA: Diagnosis not present

## 2022-10-23 DIAGNOSIS — R82998 Other abnormal findings in urine: Secondary | ICD-10-CM | POA: Diagnosis not present

## 2022-10-23 DIAGNOSIS — E669 Obesity, unspecified: Secondary | ICD-10-CM | POA: Diagnosis not present

## 2022-10-23 DIAGNOSIS — D692 Other nonthrombocytopenic purpura: Secondary | ICD-10-CM | POA: Diagnosis not present

## 2022-10-23 DIAGNOSIS — Z1331 Encounter for screening for depression: Secondary | ICD-10-CM | POA: Diagnosis not present

## 2022-10-23 DIAGNOSIS — Z13828 Encounter for screening for other musculoskeletal disorder: Secondary | ICD-10-CM | POA: Diagnosis not present

## 2022-10-23 DIAGNOSIS — Z794 Long term (current) use of insulin: Secondary | ICD-10-CM | POA: Diagnosis not present

## 2022-10-23 DIAGNOSIS — M109 Gout, unspecified: Secondary | ICD-10-CM | POA: Diagnosis not present

## 2022-10-23 DIAGNOSIS — E1140 Type 2 diabetes mellitus with diabetic neuropathy, unspecified: Secondary | ICD-10-CM | POA: Diagnosis not present

## 2022-10-23 DIAGNOSIS — Z6832 Body mass index (BMI) 32.0-32.9, adult: Secondary | ICD-10-CM | POA: Diagnosis not present

## 2022-10-23 DIAGNOSIS — I700 Atherosclerosis of aorta: Secondary | ICD-10-CM | POA: Diagnosis not present

## 2022-10-23 DIAGNOSIS — E039 Hypothyroidism, unspecified: Secondary | ICD-10-CM | POA: Diagnosis not present

## 2022-10-24 ENCOUNTER — Ambulatory Visit: Payer: PPO | Attending: Neurology

## 2022-10-24 DIAGNOSIS — M6281 Muscle weakness (generalized): Secondary | ICD-10-CM

## 2022-10-24 DIAGNOSIS — R2689 Other abnormalities of gait and mobility: Secondary | ICD-10-CM

## 2022-10-24 DIAGNOSIS — R2681 Unsteadiness on feet: Secondary | ICD-10-CM | POA: Diagnosis not present

## 2022-10-24 DIAGNOSIS — M542 Cervicalgia: Secondary | ICD-10-CM | POA: Insufficient documentation

## 2022-10-24 NOTE — Therapy (Signed)
OUTPATIENT PHYSICAL THERAPY NEURO TREATMENT   Patient Name: Anthony [name] MRN: [id number] DOB:1945/11/12, 77 y.o., male Today's Date: 10/24/2022   PCP: Gaspar Garbe, MD REFERRING PROVIDER: Butch Penny, NP  END OF SESSION:  PT End of Session - 10/24/22 1607     Visit Number 2    Number of Visits 6    Date for PT Re-Evaluation 11/28/22    Authorization Type [employer]    PT Start Time 1615    PT Stop Time 1700    PT Time Calculation (min) 45 min             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   Neuromuscular disorder (HCC) 2023   Parkinson's Disease   Neuropathy    left leg greater than right leg   Obesity (BMI 30.0-34.9) 05/14/2013   OSA on CPAP    cpap setting of 10/ 13   Pancreatitis dx march 2016   Pneumonia 12 years ago   Past Surgical History:  Procedure Laterality Date   ANTERIOR CERVICAL DECOMP/DISCECTOMY FUSION N/A 08/02/2022   Procedure: Anterior Cervical Decompression/Discectomy Fusion - Cervical Three-Cervical Four,  Cervical Four-Cervical Five,  remove Cervical Five-Cervical Six Plate;  Surgeon: Arman Bogus, MD;  Location: [facility] OR;  Service: Neurosurgery;  Laterality: N/A;  3C   BACK SURGERY  10/2009   Cervical, arterior   CARPAL TUNNEL RELEASE Left 2003   CARPAL TUNNEL RELEASE Bilateral    CATARACT EXTRACTION Bilateral 01/2012   COLONOSCOPY  2024   CORONARY BALLOON ANGIOPLASTY N/A 08/28/2021   Procedure: CORONARY BALLOON ANGIOPLASTY;  Surgeon: Elder Negus, MD;   Location: [facility];  Service: Cardiovascular;  Laterality: N/A;   EUS N/A 07/15/2014   Procedure: FULL UPPER ENDOSCOPIC ULTRASOUND (EUS) RADIAL;  Surgeon: Jeani Hawking, MD;  Location: [facility];  Service: Endoscopy;  Laterality: N/A;   LEFT HEART CATH AND CORONARY ANGIOGRAPHY N/A 05/18/2019   Procedure: LEFT HEART CATH AND CORONARY ANGIOGRAPHY;  Surgeon: Yates Decamp, MD;  Location: [facility];  Service: Cardiovascular;  Laterality: N/A;   LEFT HEART CATH AND CORONARY ANGIOGRAPHY N/A 08/28/2021   Procedure: LEFT HEART CATH AND CORONARY ANGIOGRAPHY;  Surgeon: Elder Negus, MD;  Location: [facility];  Service: Cardiovascular;  Laterality: N/A;   NASAL SINUS SURGERY  1981   RETINAL Bilateral 06/2013   Retinal peel   SHOULDER SURGERY Right 2003   TONSILLECTOMY  1954   Patient Active Problem List   Diagnosis Date Noted   S/P cervical spinal fusion 08/02/2022   Parkinsonism (HCC) 12/06/2021   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/06/2021   Hyperkalemia 12/06/2020   Coronary artery disease of native artery of native heart with stable angina pectoris (HCC) 12/03/2019   Esophageal dysphagia 11/21/2019   Gastroesophageal reflux disease 11/21/2019   Family history of colon cancer 11/21/2019   Abnormal stress  test 05/11/2019   Mixed hyperlipidemia 05/11/2019   Angina pectoris (HCC) 08/07/2018   Essential hypertension 08/07/2018   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, with long-term current use of insulin (HCC) 08/07/2018   Morbid obesity (HCC) 08/28/2017   Neuropathy 08/28/2017   Numbness and tingling of both legs below knees 11/10/2013   OSA on CPAP 05/14/2013   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   Obesity (BMI 30.0-34.9) 05/14/2013    ONSET DATE: 2023  REFERRING DIAG: R26.89 (ICD-10-CM) - Other abnormalities of gait and mobility  THERAPY DIAG:  Other abnormalities of gait and mobility  Unsteadiness  on feet  Muscle weakness (generalized)  Rationale for Evaluation and Treatment: Rehabilitation  SUBJECTIVE:                                                                                                                                                                                             SUBJECTIVE STATEMENT: Doing ok, been out walking. Noting several instances of freezing in turns. Right side of neck and center of neck are sore. Pt accompanied by: self  PERTINENT HISTORY: DM, ACDF x 26 July 2022  PAIN:  Are you having pain? Yes: NPRS scale: 6/10 Pain location: right lateral neck Pain description: aching, popping Aggravating factors: right rotation Relieving factors: heating pad  PRECAUTIONS: None  RED FLAGS: None   WEIGHT BEARING RESTRICTIONS: No  FALLS: Has patient fallen in last 6 months? No  LIVING ENVIRONMENT: Lives with: lives with their family and lives with their spouse Lives in: House/apartment Stairs:  back steps from carport 4 stairs bilat HR Has following equipment at home: None  PLOF: Independent  PATIENT GOALS: Improve balance and mobility  OBJECTIVE:   TODAY'S TREATMENT: 10/24/22 Activity Comments  Sit to stand 3x5   1/2 turns Imagery of imaginary clockface to help with freezing in turns  Supine neck extension isometric 2x10 2 sec hold  Supine passive chin retraction 1x10 2 sec hold  Manual therapy x 18 min Soft tissue mobilization to reduce pain/guarding/spasm to improve comfort and improve ROM.  Right cervical column > left. Suboccipital release.        DIAGNOSTIC FINDINGS: N/A for episode  COGNITION: Overall cognitive status: Within functional limits for tasks assessed   SENSATION: Neuropathy in bilat LE to mid calf and notes some affecting LUE > RUE  COORDINATION: Difficulty with rapid alternating movements  EDEMA:  none  MUSCLE TONE: NT    POSTURE: forward head and c-spine ROM limited  LOWER EXTREMITY ROM:    WFL  C-Spine ROM: Flexion 25 deg Extension 30 deg Right lateral flexion:  15 deg Left lateral flexion: 25 deg Right rotation: 15 deg Left rotation: 30 deg  --palpation unrevealing  LOWER EXTREMITY MMT:    Grossly 5/5 BLE  BED MOBILITY:  indep  TRANSFERS: Assistive device utilized: None  Sit to stand: Complete Independence Stand to sit: Complete Independence Chair to chair: Complete Independence Floor:  NT    CURB:  Level of Assistance: Complete Independence Assistive device utilized: None Curb Comments:   STAIRS: NT  GAIT: Gait pattern:  left foot slap at initial contact and wide BOS Distance walked:  Assistive device utilized: None Level of assistance: Complete Independence Comments:   FUNCTIONAL TESTS:  5 times sit to stand: 17 sec Mini-BESTest: 17/28  Gait speed: 14.28 sec = 2.29 ft/sec Freezing of Gait Questionnaire: 13/24     PATIENT EDUCATION: Education details: assessment details, rationale of PT intervention Person educated: Patient Education method: Explanation Education comprehension: verbalized understanding  HOME EXERCISE PROGRAM:   GOALS: Goals reviewed with patient? Yes  SHORT TERM GOALS: Target date: 11/07/2022    Patient will be independent in HEP to improve functional outcomes and to address decreased cervical spine AROM Baseline: Goal status: INITIAL  2.  Teach-back relevant strategies for freezing of gait Baseline: FoG Questionnaire 13/24 Goal status: INITIAL  3. Decrease neck pain to 3/10 with rotation to improve comfort and safety with mobility  Baseline: 7/10  Goal status: INITIAL  LONG TERM GOALS: Target date: 11/28/2022    Demo improved motor control and lower risk for falls per score 20/28 Mini-BESTest Baseline: 17/28 Goal status: INITIAL  2.  Manifest improved balance and LE strength per time 15 sec 5xSTS test Baseline: 17 sec w/ retro-LOB Goal status: INITIAL  3.  Report understanding of relevant  community-level groups/classes for those with PD Baseline:  Goal status: INITIAL  4.  Demo improved gait speed to 2.8 ft/sec to improve efficiency of community ambulation Baseline: 2.29 ft/sec Goal status: INITIAL    ASSESSMENT:  CLINICAL IMPRESSION: Initiated activities for HEP today with emphasis on strength/balance for sit to stand and educated in self-progression, e.g. adding weight to front for this. 1/2 turns with clock face imagery on the ground to improve step initiation and sequence due to freezing of gait. Initiated activities for gentle cervical spine strength and mobility to improve ROM and reduce pain. Soft tissue mobilization to bilat cervical column R>L to address pain, guarding, and trigger points along scalenes and levator scap with report improved pain at end of session from initial 6/10 to 2/10 post-tx.  Continued sessions to progress POC details and improve mobility and reduce neck pain.    OBJECTIVE IMPAIRMENTS: Abnormal gait, decreased activity tolerance, decreased balance, decreased coordination, decreased mobility, difficulty walking, decreased ROM, decreased strength, hypomobility, postural dysfunction, and pain.   ACTIVITY LIMITATIONS: lifting, bending, transfers, and locomotion level  PARTICIPATION LIMITATIONS: cleaning, laundry, driving, community activity, yard work, and exercise routine  PERSONAL FACTORS: Age, Time since onset of injury/illness/exacerbation, and 3+ comorbidities: PMH/cervical ACDF x 5  are also affecting patient's functional outcome.   REHAB POTENTIAL: Good  CLINICAL DECISION MAKING: Evolving/moderate complexity  EVALUATION COMPLEXITY: Moderate  PLAN:  PT FREQUENCY: 1x/week  PT DURATION: 6 weeks  PLANNED INTERVENTIONS: Therapeutic exercises, Therapeutic activity, Neuromuscular re-education, Balance training, Gait training, Patient/Family education, Self Care, Joint mobilization, Stair training, Vestibular training, Canalith  repositioning, DME instructions, Aquatic Therapy, Dry Needling, Electrical stimulation, Spinal mobilization, Cryotherapy, Moist heat, and Manual therapy  PLAN FOR NEXT SESSION: HEP review, balance HEP additions   4:08 PM,  10/24/22 M. Shary Decamp, PT, DPT Physical Therapist- [place] Office Number: [phone]

## 2022-10-31 ENCOUNTER — Ambulatory Visit: Payer: PPO

## 2022-10-31 DIAGNOSIS — R2689 Other abnormalities of gait and mobility: Secondary | ICD-10-CM | POA: Diagnosis not present

## 2022-10-31 DIAGNOSIS — R2681 Unsteadiness on feet: Secondary | ICD-10-CM

## 2022-10-31 DIAGNOSIS — M6281 Muscle weakness (generalized): Secondary | ICD-10-CM

## 2022-10-31 NOTE — Therapy (Signed)
OUTPATIENT PHYSICAL THERAPY NEURO TREATMENT   Patient Name: Anthony [name] MRN: [id number] DOB:1945-09-24, 77 y.o., male Today's Date: 10/31/2022   PCP: Gaspar Garbe, MD REFERRING PROVIDER: Butch Penny, NP  END OF SESSION:  PT End of Session - 10/31/22 1652     Visit Number 3    Number of Visits 6    Date for PT Re-Evaluation 11/28/22    Authorization Type [employer]    PT Start Time 1630   therapist late from previous appointment   PT Stop Time 1700    PT Time Calculation (min) 30 min              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   Neuromuscular disorder (HCC) 2023   Parkinson's Disease   Neuropathy    left leg greater than right leg   Obesity (BMI 30.0-34.9) 05/14/2013   OSA on CPAP    cpap setting of 10/ 13   Pancreatitis dx march 2016   Pneumonia 12 years ago   Past Surgical History:  Procedure Laterality Date   ANTERIOR CERVICAL DECOMP/DISCECTOMY FUSION N/A 08/02/2022   Procedure: Anterior Cervical Decompression/Discectomy Fusion - Cervical Three-Cervical Four,  Cervical Four-Cervical Five,  remove Cervical Five-Cervical Six Plate;  Surgeon: Arman Bogus, MD;  Location: [facility] OR;  Service: Neurosurgery;  Laterality: N/A;  3C   BACK SURGERY  10/2009   Cervical, arterior   CARPAL TUNNEL RELEASE Left 2003   CARPAL TUNNEL RELEASE Bilateral    CATARACT EXTRACTION Bilateral 01/2012   COLONOSCOPY  2024   CORONARY BALLOON ANGIOPLASTY N/A 08/28/2021   Procedure: CORONARY BALLOON  ANGIOPLASTY;  Surgeon: Elder Negus, MD;  Location: [facility];  Service: Cardiovascular;  Laterality: N/A;   EUS N/A 07/15/2014   Procedure: FULL UPPER ENDOSCOPIC ULTRASOUND (EUS) RADIAL;  Surgeon: Jeani Hawking, MD;  Location: [facility];  Service: Endoscopy;  Laterality: N/A;   LEFT HEART CATH AND CORONARY ANGIOGRAPHY N/A 05/18/2019   Procedure: LEFT HEART CATH AND CORONARY ANGIOGRAPHY;  Surgeon: Yates Decamp, MD;  Location: [facility];  Service: Cardiovascular;  Laterality: N/A;   LEFT HEART CATH AND CORONARY ANGIOGRAPHY N/A 08/28/2021   Procedure: LEFT HEART CATH AND CORONARY ANGIOGRAPHY;  Surgeon: Elder Negus, MD;  Location: [facility];  Service: Cardiovascular;  Laterality: N/A;   NASAL SINUS SURGERY  1981   RETINAL Bilateral 06/2013   Retinal peel   SHOULDER SURGERY Right 2003   TONSILLECTOMY  1954   Patient Active Problem List   Diagnosis Date Noted   S/P cervical spinal fusion 08/02/2022   Parkinsonism (HCC) 12/06/2021   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/06/2021   Hyperkalemia 12/06/2020   Coronary artery disease of native artery of native heart with stable angina pectoris (HCC) 12/03/2019   Esophageal dysphagia 11/21/2019   Gastroesophageal reflux disease 11/21/2019   Family history of  colon cancer 11/21/2019   Abnormal stress test 05/11/2019   Mixed hyperlipidemia 05/11/2019   Angina pectoris (HCC) 08/07/2018   Essential hypertension 08/07/2018   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  Rationale for Evaluation and Treatment: Rehabilitation  SUBJECTIVE:                                                                                                                                                                                             SUBJECTIVE STATEMENT: Right side of neck is very painful today, felt better after last session, not sure why its so much mor epainful today Pt accompanied by: self  PERTINENT HISTORY: DM, ACDF x 26 July 2022  PAIN:  Are you having pain? Yes: NPRS scale: 8/10 Pain location: right lateral neck Pain description: aching, popping Aggravating factors: right rotation Relieving factors: heating pad  PRECAUTIONS: None  RED FLAGS: None   WEIGHT BEARING RESTRICTIONS: No  FALLS: Has patient fallen in last 6 months? No  LIVING ENVIRONMENT: Lives with: lives with their family and lives with their spouse Lives in: House/apartment Stairs:  back steps from carport 4 stairs bilat HR Has following equipment at home: None  PLOF: Independent  PATIENT GOALS: Improve balance and mobility  OBJECTIVE:   TODAY'S TREATMENT: 10/31/22 Activity Comments  Supine w/ MHP to neck   Soft tissue mobilization Trigger points, pain, guarding along right cervical column. STM and manual shoulder depression stretch to reduce pain/guarding, tolerated well  Supine chin retraction 10x Cues for sequence-postural re-ed  Supine neck ext iso 10x Cues for position/sequence- postural re-ed  Corner balance  -feet together EO x 30 sec, eyes closed 3x10 sec -head/body turns 3x EO/EC--finger tip support for eyes closed -semi-tandem 2x15 sec       PATIENT EDUCATION: Education details: assessment details, rationale of PT intervention Person educated: Patient Education method: Explanation Education comprehension: verbalized understanding  HOME EXERCISE PROGRAM: Access Code: [email or web address] URL: [email or web address] Date:  10/31/2022 Prepared by: Shary Decamp  Exercises - Sit to Stand with Arms Crossed  - 1 x daily - 7 x weekly - 5 sets - 5 reps - Standing Half Turn with Support  - 1 x  daily - 7 x weekly - 1 sets - 10 reps - Supine Isometric Neck Extension  - 1 x daily - 7 x weekly - 1-3 sets - 10 reps - 2 sec hold - Supine Passive Cervical Retraction  - 1 x daily - 7 x weekly - 1-3 sets - 10 reps - Corner Balance Feet Together With Eyes Open  - 1 x daily - 7 x weekly - 3 sets - 30 sec hold - Corner Balance Feet Together With Eyes Closed  - 1 x daily - 7 x weekly - 3 sets - 10-30 sec hold - Corner Balance Feet Together: Eyes Open With Head Turns  - 1 x daily - 7 x weekly - 3 sets - 3 reps - Corner Balance Feet Together: Eyes Closed With Head Turns  - 1 x daily - 7 x weekly - 3 sets - 3 reps - Semi-Tandem Corner Balance With Eyes Open  - 1 x daily - 7 x weekly - 3 sets - 15-30 sec hold     DIAGNOSTIC FINDINGS: N/A for episode  COGNITION: Overall cognitive status: Within functional limits for tasks assessed   SENSATION: Neuropathy in bilat LE to mid calf and notes some affecting LUE > RUE  COORDINATION: Difficulty with rapid alternating movements  EDEMA:  none  MUSCLE TONE: NT    POSTURE: forward head and c-spine ROM limited  LOWER EXTREMITY ROM:   WFL  C-Spine ROM: Flexion 25 deg Extension 30 deg Right lateral flexion: 15 deg Left lateral flexion: 25 deg Right rotation: 15 deg Left rotation: 30 deg  --palpation unrevealing  LOWER EXTREMITY MMT:    Grossly 5/5 BLE  BED MOBILITY:  indep  TRANSFERS: Assistive device utilized: None  Sit to stand: Complete Independence Stand to sit: Complete Independence Chair to chair: Complete Independence Floor:  NT    CURB:  Level of Assistance: Complete Independence Assistive device utilized: None Curb Comments:   STAIRS: NT  GAIT: Gait pattern:  left foot slap at initial contact and wide BOS Distance walked:  Assistive device  utilized: None Level of assistance: Complete Independence Comments:   FUNCTIONAL TESTS:  5 times sit to stand: 17 sec Mini-BESTest: 17/28  Gait speed: 14.28 sec = 2.29 ft/sec Freezing of Gait Questionnaire: 13/24       GOALS: Goals reviewed with patient? Yes  SHORT TERM GOALS: Target date: 11/07/2022    Patient will be independent in HEP to improve functional outcomes and to address decreased cervical spine AROM Baseline: Goal status: INITIAL  2.  Teach-back relevant strategies for freezing of gait Baseline: FoG Questionnaire 13/24 Goal status: INITIAL  3. Decrease neck pain to 3/10 with rotation to improve comfort and safety with mobility  Baseline: 7/10  Goal status: INITIAL  LONG TERM GOALS: Target date: 11/28/2022    Demo improved motor control and lower risk for falls per score 20/28 Mini-BESTest Baseline: 17/28 Goal status: INITIAL  2.  Manifest improved balance and LE strength per time 15 sec 5xSTS test Baseline: 17 sec w/ retro-LOB Goal status: INITIAL  3.  Report understanding of relevant community-level groups/classes for those with PD Baseline:  Goal status: INITIAL  4.  Demo improved gait speed to 2.8 ft/sec to improve efficiency of community ambulation Baseline: 2.29 ft/sec Goal status: INITIAL    ASSESSMENT:  CLINICAL IMPRESSION: Reports greater neck pain today, no MOI.  Presents with guarding, pain to palpation, and trigger points along right cervical column, tolerated soft tissue moilization well with report of  pain to 6/10 post-tx. Review of initial gentle c-spine ROM/isometric activities requiring cues for seuqence/position. Initiated corner balance activities to improve postural awareness/stability with difficulty under eyes closed and feet together positions only tolerating 10 sec intervals eyes closed and requiring finger tip support with motion. Continued sessions to progress POC details to improve neck pain, mobility, and motor control    OBJECTIVE IMPAIRMENTS: Abnormal gait, decreased activity tolerance, decreased balance, decreased coordination, decreased mobility, difficulty walking, decreased ROM, decreased strength, hypomobility, postural dysfunction, and pain.   ACTIVITY LIMITATIONS: lifting, bending, transfers, and locomotion level  PARTICIPATION LIMITATIONS: cleaning, laundry, driving, community activity, yard work, and exercise routine  PERSONAL FACTORS: Age, Time since onset of injury/illness/exacerbation, and 3+ comorbidities: PMH/cervical ACDF x 5  are also affecting patient's functional outcome.   REHAB POTENTIAL: Good  CLINICAL DECISION MAKING: Evolving/moderate complexity  EVALUATION COMPLEXITY: Moderate  PLAN:  PT FREQUENCY: 1x/week  PT DURATION: 6 weeks  PLANNED INTERVENTIONS: Therapeutic exercises, Therapeutic activity, Neuromuscular re-education, Balance training, Gait training, Patient/Family education, Self Care, Joint mobilization, Stair training, Vestibular training, Canalith repositioning, DME instructions, Aquatic Therapy, Dry Needling, Electrical stimulation, Spinal mobilization, Cryotherapy, Moist heat, and Manual therapy  PLAN FOR NEXT SESSION: HEP review, balance HEP additions   4:53 PM, 10/31/22 M. Shary Decamp, PT, DPT Physical Therapist- [place] Office Number: [phone]

## 2022-11-07 ENCOUNTER — Ambulatory Visit: Payer: PPO

## 2022-11-07 DIAGNOSIS — M6281 Muscle weakness (generalized): Secondary | ICD-10-CM

## 2022-11-07 DIAGNOSIS — R2689 Other abnormalities of gait and mobility: Secondary | ICD-10-CM | POA: Diagnosis not present

## 2022-11-07 DIAGNOSIS — R2681 Unsteadiness on feet: Secondary | ICD-10-CM

## 2022-11-07 NOTE — Therapy (Signed)
OUTPATIENT PHYSICAL THERAPY NEURO TREATMENT   Patient Name: Anthony [name] MRN: [id number] DOB:1945-02-17, 77 y.o., male Today's Date: 11/07/2022   PCP: Gaspar Garbe, MD REFERRING PROVIDER: Butch Penny, NP  END OF SESSION:  PT End of Session - 11/07/22 1616     Visit Number 4    Number of Visits 6    Date for PT Re-Evaluation 11/28/22    Authorization Type [employer]    PT Start Time 1615    PT Stop Time 1700    PT Time Calculation (min) 45 min              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   Neuromuscular disorder (HCC) 2023   Parkinson's Disease   Neuropathy    left leg greater than right leg   Obesity (BMI 30.0-34.9) 05/14/2013   OSA on CPAP    cpap setting of 10/ 13   Pancreatitis dx march 2016   Pneumonia 12 years ago   Past Surgical History:  Procedure Laterality Date   ANTERIOR CERVICAL DECOMP/DISCECTOMY FUSION N/A 08/02/2022   Procedure: Anterior Cervical Decompression/Discectomy Fusion - Cervical Three-Cervical Four,  Cervical Four-Cervical Five,  remove Cervical Five-Cervical Six Plate;  Surgeon: Arman Bogus, MD;  Location: [facility] OR;  Service: Neurosurgery;  Laterality: N/A;  3C   BACK SURGERY  10/2009   Cervical, arterior   CARPAL TUNNEL RELEASE Left 2003   CARPAL TUNNEL RELEASE Bilateral    CATARACT EXTRACTION Bilateral 01/2012   COLONOSCOPY  2024   CORONARY BALLOON ANGIOPLASTY N/A 08/28/2021   Procedure: CORONARY BALLOON ANGIOPLASTY;  Surgeon: Elder Negus, MD;   Location: [facility];  Service: Cardiovascular;  Laterality: N/A;   EUS N/A 07/15/2014   Procedure: FULL UPPER ENDOSCOPIC ULTRASOUND (EUS) RADIAL;  Surgeon: Jeani Hawking, MD;  Location: [facility];  Service: Endoscopy;  Laterality: N/A;   LEFT HEART CATH AND CORONARY ANGIOGRAPHY N/A 05/18/2019   Procedure: LEFT HEART CATH AND CORONARY ANGIOGRAPHY;  Surgeon: Yates Decamp, MD;  Location: [facility];  Service: Cardiovascular;  Laterality: N/A;   LEFT HEART CATH AND CORONARY ANGIOGRAPHY N/A 08/28/2021   Procedure: LEFT HEART CATH AND CORONARY ANGIOGRAPHY;  Surgeon: Elder Negus, MD;  Location: [facility];  Service: Cardiovascular;  Laterality: N/A;   NASAL SINUS SURGERY  1981   RETINAL Bilateral 06/2013   Retinal peel   SHOULDER SURGERY Right 2003   TONSILLECTOMY  1954   Patient Active Problem List   Diagnosis Date Noted   S/P cervical spinal fusion 08/02/2022   Parkinsonism (HCC) 12/06/2021   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/06/2021   Hyperkalemia 12/06/2020   Coronary artery disease of native artery of native heart with stable angina pectoris (HCC) 12/03/2019   Esophageal dysphagia 11/21/2019   Gastroesophageal reflux disease 11/21/2019   Family history of colon cancer 11/21/2019   Abnormal  stress test 05/11/2019   Mixed hyperlipidemia 05/11/2019   Angina pectoris (HCC) 08/07/2018   Essential hypertension 08/07/2018   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, with long-term current use of insulin (HCC) 08/07/2018   Morbid obesity (HCC) 08/28/2017   Neuropathy 08/28/2017   Numbness and tingling of both legs below knees 11/10/2013   OSA on CPAP 05/14/2013   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   Obesity (BMI 30.0-34.9) 05/14/2013    ONSET DATE: 2023  REFERRING DIAG: R26.89 (ICD-10-CM) - Other abnormalities of gait and mobility  THERAPY DIAG:  Other abnormalities of gait and mobility  Unsteadiness  on feet  Muscle weakness (generalized)  Rationale for Evaluation and Treatment: Rehabilitation  SUBJECTIVE:                                                                                                                                                                                             SUBJECTIVE STATEMENT: Been busy working in the yard, neck is paying the price for it Pt accompanied by: self  PERTINENT HISTORY: DM, ACDF x 26 July 2022  PAIN:  Are you having pain? Yes: NPRS scale: 6/10 Pain location: right lateral neck Pain description: aching, popping Aggravating factors: right rotation Relieving factors: heating pad  PRECAUTIONS: None  RED FLAGS: None   WEIGHT BEARING RESTRICTIONS: No  FALLS: Has patient fallen in last 6 months? No  LIVING ENVIRONMENT: Lives with: lives with their family and lives with their spouse Lives in: House/apartment Stairs:  back steps from carport 4 stairs bilat HR Has following equipment at home: None  PLOF: Independent  PATIENT GOALS: Improve balance and mobility  OBJECTIVE:   TODAY'S TREATMENT: 11/07/22 Activity Comments  NU-step resistance intervals LE only x 8 min 30 sec heavy; 30 sec light. Holding MHP to neck during activity  HEP review -progressed supine chin retractions to standing  Lateral weight shift/step and reach   Staggered stance ant-post weight shift   Freezing of Gait strategies and questionnaire review Score: 13/24  Soft tissue mobilization Stm to right neck lateral column for pain     TODAY'S TREATMENT: 10/31/22 Activity Comments  Supine w/ MHP to neck   Soft tissue mobilization Trigger points, pain, guarding along right cervical column. STM and manual shoulder depression stretch to reduce pain/guarding, tolerated well  Supine chin retraction 10x Cues for sequence-postural re-ed  Supine neck ext iso 10x Cues for position/sequence- postural re-ed  Corner balance  -feet together EO x 30 sec, eyes closed  3x10 sec -head/body turns 3x EO/EC--finger tip support for eyes closed -semi-tandem 2x15 sec  PATIENT EDUCATION: Education details: assessment details, rationale of PT intervention Person educated: Patient Education method: Explanation Education comprehension: verbalized understanding  HOME EXERCISE PROGRAM: Access Code: [email or web address] URL: [email or web address] Date: 10/31/2022 Prepared by: Shary Decamp  Exercises - Sit to Stand with Arms Crossed  - 1 x daily - 7 x weekly - 5 sets - 5 reps - Standing Half Turn with Support  - 1 x daily - 7 x weekly - 1 sets - 10 reps - Supine Isometric Neck Extension  - 1 x daily - 7 x weekly - 1-3 sets - 10 reps - 2 sec hold - Supine Passive Cervical Retraction  - 1 x daily - 7 x weekly - 1-3 sets - 10 reps - Corner Balance Feet Together With Eyes Open  - 1 x daily - 7 x weekly - 3 sets - 30 sec hold - Corner Balance Feet Together With Eyes Closed  - 1 x daily - 7 x weekly - 3 sets - 10-30 sec hold - Corner Balance Feet Together: Eyes Open With Head Turns  - 1 x daily - 7 x weekly - 3 sets - 3 reps - Corner Balance Feet Together: Eyes Closed With Head Turns  - 1 x daily - 7 x weekly - 3 sets - 3 reps - Semi-Tandem Corner Balance With Eyes Open  - 1 x daily - 7 x weekly - 3 sets - 15-30 sec hold     DIAGNOSTIC FINDINGS: N/A for episode  COGNITION: Overall cognitive status: Within functional limits for tasks assessed   SENSATION: Neuropathy in bilat LE to mid calf and notes some affecting LUE > RUE  COORDINATION: Difficulty with rapid alternating movements  EDEMA:  none  MUSCLE TONE: NT    POSTURE: forward head and c-spine ROM limited  LOWER EXTREMITY ROM:   WFL  C-Spine ROM: Flexion 25 deg Extension 30 deg Right lateral flexion: 15 deg Left lateral flexion: 25 deg Right rotation: 15 deg Left rotation: 30 deg  --palpation unrevealing  LOWER EXTREMITY MMT:    Grossly 5/5 BLE  BED MOBILITY:   indep  TRANSFERS: Assistive device utilized: None  Sit to stand: Complete Independence Stand to sit: Complete Independence Chair to chair: Complete Independence Floor:  NT    CURB:  Level of Assistance: Complete Independence Assistive device utilized: None Curb Comments:   STAIRS: NT  GAIT: Gait pattern:  left foot slap at initial contact and wide BOS Distance walked:  Assistive device utilized: None Level of assistance: Complete Independence Comments:   FUNCTIONAL TESTS:  5 times sit to stand: 17 sec Mini-BESTest: 17/28  Gait speed: 14.28 sec = 2.29 ft/sec Freezing of Gait Questionnaire: 13/24       GOALS: Goals reviewed with patient? Yes  SHORT TERM GOALS: Target date: 11/07/2022    Patient will be independent in HEP to improve functional outcomes and to address decreased cervical spine AROM Baseline: Goal status: MET  2.  Teach-back relevant strategies for freezing of gait Baseline: FoG Questionnaire 13/24 Goal status: IN PROGRESS  3. Decrease neck pain to 3/10 with rotation to improve comfort and safety with mobility  Baseline: 7/10; (11/07/22) 6/10  Goal status: IN PROGRESS  LONG TERM GOALS: Target date: 11/28/2022    Demo improved motor control and lower risk for falls per score 20/28 Mini-BESTest Baseline: 17/28 Goal status: INITIAL  2.  Manifest improved balance and LE strength per time 15 sec 5xSTS test Baseline: 17 sec w/ retro-LOB Goal status: INITIAL  3.  Report understanding of relevant community-level groups/classes for those with PD Baseline:  Goal status: INITIAL  4.  Demo improved gait speed to 2.8 ft/sec to improve efficiency of community ambulation Baseline: 2.29 ft/sec Goal status: INITIAL    ASSESSMENT:  CLINICAL IMPRESSION: Initiated activities with NU-step for general conditioning and alternating movements. HEP review with excellent recall demo and able to progress chin retractions for postural control to standing  with light cues for sequence.  Freezing of gait questionnaire with 13/24. Review of techniques for FoG strategies and verbalizes/demo understanding. Addition of weight shifting activities to improve fluency with these strategies.  Continued sessions to advance POC details and improve mobility and reduce risk for falls  OBJECTIVE IMPAIRMENTS: Abnormal gait, decreased activity tolerance, decreased balance, decreased coordination, decreased mobility, difficulty walking, decreased ROM, decreased strength, hypomobility, postural dysfunction, and pain.   ACTIVITY LIMITATIONS: lifting, bending, transfers, and locomotion level  PARTICIPATION LIMITATIONS: cleaning, laundry, driving, community activity, yard work, and exercise routine  PERSONAL FACTORS: Age, Time since onset of injury/illness/exacerbation, and 3+ comorbidities: PMH/cervical ACDF x 5  are also affecting patient's functional outcome.   REHAB POTENTIAL: Good  CLINICAL DECISION MAKING: Evolving/moderate complexity  EVALUATION COMPLEXITY: Moderate  PLAN:  PT FREQUENCY: 1x/week  PT DURATION: 6 weeks  PLANNED INTERVENTIONS: Therapeutic exercises, Therapeutic activity, Neuromuscular re-education, Balance training, Gait training, Patient/Family education, Self Care, Joint mobilization, Stair training, Vestibular training, Canalith repositioning, DME instructions, Aquatic Therapy, Dry Needling, Electrical stimulation, Spinal mobilization, Cryotherapy, Moist heat, and Manual therapy  PLAN FOR NEXT SESSION: HEP review, balance HEP additions   4:17 PM, 11/07/22 M. Shary Decamp, PT, DPT Physical Therapist- [place] Office Number: [phone]

## 2022-11-14 ENCOUNTER — Ambulatory Visit: Payer: PPO

## 2022-11-14 DIAGNOSIS — M6281 Muscle weakness (generalized): Secondary | ICD-10-CM

## 2022-11-14 DIAGNOSIS — R2689 Other abnormalities of gait and mobility: Secondary | ICD-10-CM

## 2022-11-14 DIAGNOSIS — R2681 Unsteadiness on feet: Secondary | ICD-10-CM

## 2022-11-14 NOTE — Therapy (Signed)
OUTPATIENT PHYSICAL THERAPY NEURO TREATMENT   Patient Name: Anthony [name] MRN: [id number] DOB:Jan 13, 1946, 77 y.o., male Today's Date: 11/14/2022   PCP: Gaspar Garbe, MD REFERRING PROVIDER: Butch Penny, NP  END OF SESSION:  PT End of Session - 11/14/22 1616     Visit Number 5    Number of Visits 6    Date for PT Re-Evaluation 11/28/22    Authorization Type [employer]    PT Start Time 1615    PT Stop Time 1700    PT Time Calculation (min) 45 min              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   Neuromuscular disorder (HCC) 2023   Parkinson's Disease   Neuropathy    left leg greater than right leg   Obesity (BMI 30.0-34.9) 05/14/2013   OSA on CPAP    cpap setting of 10/ 13   Pancreatitis dx march 2016   Pneumonia 12 years ago   Past Surgical History:  Procedure Laterality Date   ANTERIOR CERVICAL DECOMP/DISCECTOMY FUSION N/A 08/02/2022   Procedure: Anterior Cervical Decompression/Discectomy Fusion - Cervical Three-Cervical Four,  Cervical Four-Cervical Five,  remove Cervical Five-Cervical Six Plate;  Surgeon: Arman Bogus, MD;  [facility];  Service: Neurosurgery;  Laterality: N/A;  3C   BACK SURGERY  10/2009   Cervical, arterior   CARPAL TUNNEL RELEASE Left 2003   CARPAL TUNNEL RELEASE Bilateral    CATARACT EXTRACTION Bilateral 01/2012   COLONOSCOPY  2024   CORONARY BALLOON ANGIOPLASTY N/A 08/28/2021   Procedure: CORONARY BALLOON ANGIOPLASTY;  Surgeon: Elder Negus, MD;   Location: [facility];  Service: Cardiovascular;  Laterality: N/A;   EUS N/A 07/15/2014   Procedure: FULL UPPER ENDOSCOPIC ULTRASOUND (EUS) RADIAL;  Surgeon: Jeani Hawking, MD;  Location: [facility];  Service: Endoscopy;  Laterality: N/A;   LEFT HEART CATH AND CORONARY ANGIOGRAPHY N/A 05/18/2019   Procedure: LEFT HEART CATH AND CORONARY ANGIOGRAPHY;  Surgeon: Yates Decamp, MD;  Location: [facility];  Service: Cardiovascular;  Laterality: N/A;   LEFT HEART CATH AND CORONARY ANGIOGRAPHY N/A 08/28/2021   Procedure: LEFT HEART CATH AND CORONARY ANGIOGRAPHY;  Surgeon: Elder Negus, MD;  Location: [facility];  Service: Cardiovascular;  Laterality: N/A;   NASAL SINUS SURGERY  1981   RETINAL Bilateral 06/2013   Retinal peel   SHOULDER SURGERY Right 2003   TONSILLECTOMY  1954   Patient Active Problem List   Diagnosis Date Noted   S/P cervical spinal fusion 08/02/2022   Parkinsonism (HCC) 12/06/2021   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/06/2021   Hyperkalemia 12/06/2020   Coronary artery disease of native artery of native heart with stable angina pectoris (HCC) 12/03/2019   Esophageal dysphagia 11/21/2019   Gastroesophageal reflux disease 11/21/2019   Family history of colon cancer 11/21/2019   Abnormal  stress test 05/11/2019   Mixed hyperlipidemia 05/11/2019   Angina pectoris (HCC) 08/07/2018   Essential hypertension 08/07/2018   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, with long-term current use of insulin (HCC) 08/07/2018   Morbid obesity (HCC) 08/28/2017   Neuropathy 08/28/2017   Numbness and tingling of both legs below knees 11/10/2013   OSA on CPAP 05/14/2013   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   Obesity (BMI 30.0-34.9) 05/14/2013    ONSET DATE: 2023  REFERRING DIAG: R26.89 (ICD-10-CM) - Other abnormalities of gait and mobility  THERAPY DIAG:  Other abnormalities of gait and mobility  Unsteadiness  on feet  Muscle weakness (generalized)  Rationale for Evaluation and Treatment: Rehabilitation  SUBJECTIVE:                                                                                                                                                                                             SUBJECTIVE STATEMENT: Neck has been doing better, but this AM woke up and stiff Pt accompanied by: self  PERTINENT HISTORY: DM, ACDF x 26 July 2022  PAIN:  Are you having pain? Yes: NPRS scale: 5/10 Pain location: right lateral neck Pain description: aching, popping Aggravating factors: right rotation Relieving factors: heating pad  PRECAUTIONS: None  RED FLAGS: None   WEIGHT BEARING RESTRICTIONS: No  FALLS: Has patient fallen in last 6 months? No  LIVING ENVIRONMENT: Lives with: lives with their family and lives with their spouse Lives in: House/apartment Stairs:  back steps from carport 4 stairs bilat HR Has following equipment at home: None  PLOF: Independent  PATIENT GOALS: Improve balance and mobility  OBJECTIVE:   TODAY'S TREATMENT: 11/14/22 Activity Comments  NU-step BLE only x 8 min resistance 5 Holding MHP to neck to address pain  Standing PWR moves To improve large amplitude mobility, flexibility, and coordination  Pt education Regarding local PD resources/exercise classes. Education regarding blood sugar/DM2 as it pertains to exercise intervention  Manual therapy Soft tissue mobilization to reduce pain/guarding right cervical column. ROM cervical spine to improve rotation ROM to improve comfort and safety for driving            PATIENT EDUCATION: Education details: assessment details, rationale of PT intervention Person educated: Patient Education method: Explanation Education comprehension: verbalized understanding  HOME EXERCISE PROGRAM: Access Code: [email or web address] URL: [email or web address] Date: 10/31/2022 Prepared by: Shary Decamp  Exercises - Sit to Stand with Arms Crossed  - 1 x daily - 7 x weekly - 5 sets - 5 reps - Standing Half Turn with Support  - 1 x daily - 7 x  weekly - 1 sets - 10 reps - Supine Isometric Neck Extension  - 1 x daily - 7 x weekly - 1-3 sets - 10 reps - 2 sec hold - Supine Passive Cervical Retraction  - 1 x daily - 7 x weekly - 1-3 sets - 10 reps - Corner Balance Feet Together With Eyes Open  - 1 x daily - 7 x weekly - 3 sets - 30 sec hold - Corner Balance Feet Together With Eyes Closed  - 1 x daily - 7 x weekly - 3 sets - 10-30 sec hold - Corner Balance Feet Together: Eyes Open With Head Turns  - 1 x daily - 7 x weekly - 3 sets - 3 reps - Corner Balance Feet Together: Eyes Closed With Head Turns  - 1 x daily - 7 x weekly - 3 sets - 3 reps - Semi-Tandem Corner Balance With Eyes Open  - 1 x daily - 7 x weekly - 3 sets - 15-30 sec hold     DIAGNOSTIC FINDINGS: N/A for episode  COGNITION: Overall cognitive status: Within functional limits for tasks assessed   SENSATION: Neuropathy in bilat LE to mid calf and notes some affecting LUE > RUE  COORDINATION: Difficulty with rapid alternating movements  EDEMA:  none  MUSCLE TONE: NT    POSTURE: forward head and c-spine ROM limited  LOWER EXTREMITY ROM:   WFL  C-Spine ROM: Flexion 25 deg Extension 30 deg Right lateral flexion: 15 deg Left lateral flexion: 25 deg Right rotation: 15 deg Left rotation: 30 deg  --palpation unrevealing  LOWER EXTREMITY MMT:    Grossly 5/5 BLE  BED MOBILITY:  indep  TRANSFERS: Assistive device utilized: None  Sit to stand: Complete Independence Stand to sit: Complete Independence Chair to chair: Complete Independence Floor:  NT    CURB:  Level of Assistance: Complete Independence Assistive device utilized: None Curb Comments:   STAIRS: NT  GAIT: Gait pattern:  left foot slap at initial contact and wide BOS Distance walked:  Assistive device utilized: None Level of  assistance: Complete Independence Comments:   FUNCTIONAL TESTS:  5 times sit to stand: 17 sec Mini-BESTest: 17/28  Gait speed: 14.28 sec = 2.29 ft/sec Freezing of Gait Questionnaire: 13/24       GOALS: Goals reviewed with patient? Yes  SHORT TERM GOALS: Target date: 11/07/2022    Patient will be independent in HEP to improve functional outcomes and to address decreased cervical spine AROM Baseline: Goal status: MET  2.  Teach-back relevant strategies for freezing of gait Baseline: FoG Questionnaire 13/24 Goal status: IN PROGRESS  3. Decrease neck pain to 3/10 with rotation to improve comfort and safety with mobility  Baseline: 7/10; (11/07/22) 6/10  Goal status: IN PROGRESS  LONG TERM GOALS: Target date: 11/28/2022    Demo improved motor control and lower risk for falls per score 20/28 Mini-BESTest Baseline: 17/28 Goal status: INITIAL  2.  Manifest improved balance and LE strength per time 15 sec 5xSTS test Baseline: 17 sec w/ retro-LOB Goal status: INITIAL  3.  Report understanding of relevant community-level groups/classes for those with PD Baseline:  Goal status: INITIAL  4.  Demo improved gait speed to 2.8 ft/sec to improve efficiency of community ambulation Baseline: 2.29 ft/sec Goal status: INITIAL    ASSESSMENT:  CLINICAL IMPRESSION: Pt reports some improvement in neck pain and reduced guarding and tendeness to palpation with soft tissue mobilization appreciated, reduced guarding to rotation in supine.  NU-step to improve  rapid alternating LE and varied resistance to improve power output. Standing large amplitude movements to improve coordination requiring heavy cues and reference for sequence and several instances of unsteadiness and LOB. Reinforced education regarding exercise intervention for PD and DM. Verbalizes understading  OBJECTIVE IMPAIRMENTS: Abnormal gait, decreased activity tolerance, decreased balance, decreased coordination, decreased  mobility, difficulty walking, decreased ROM, decreased strength, hypomobility, postural dysfunction, and pain.   ACTIVITY LIMITATIONS: lifting, bending, transfers, and locomotion level  PARTICIPATION LIMITATIONS: cleaning, laundry, driving, community activity, yard work, and exercise routine  PERSONAL FACTORS: Age, Time since onset of injury/illness/exacerbation, and 3+ comorbidities: PMH/cervical ACDF x 5  are also affecting patient's functional outcome.   REHAB POTENTIAL: Good  CLINICAL DECISION MAKING: Evolving/moderate complexity  EVALUATION COMPLEXITY: Moderate  PLAN:  PT FREQUENCY: 1x/week  PT DURATION: 6 weeks  PLANNED INTERVENTIONS: Therapeutic exercises, Therapeutic activity, Neuromuscular re-education, Balance training, Gait training, Patient/Family education, Self Care, Joint mobilization, Stair training, Vestibular training, Canalith repositioning, DME instructions, Aquatic Therapy, Dry Needling, Electrical stimulation, Spinal mobilization, Cryotherapy, Moist heat, and Manual therapy  PLAN FOR NEXT SESSION: Recert vs D/C?   4:16 PM, 11/14/22 M. Shary Decamp, PT, DPT Physical Therapist- [place] Office Number: [phone]

## 2022-11-16 DIAGNOSIS — E1165 Type 2 diabetes mellitus with hyperglycemia: Secondary | ICD-10-CM | POA: Diagnosis not present

## 2022-11-20 DIAGNOSIS — G4733 Obstructive sleep apnea (adult) (pediatric): Secondary | ICD-10-CM | POA: Diagnosis not present

## 2022-11-21 ENCOUNTER — Ambulatory Visit: Payer: PPO

## 2022-11-21 DIAGNOSIS — R2689 Other abnormalities of gait and mobility: Secondary | ICD-10-CM

## 2022-11-21 DIAGNOSIS — R2681 Unsteadiness on feet: Secondary | ICD-10-CM

## 2022-11-21 DIAGNOSIS — M542 Cervicalgia: Secondary | ICD-10-CM

## 2022-11-21 DIAGNOSIS — M6281 Muscle weakness (generalized): Secondary | ICD-10-CM

## 2022-11-21 NOTE — Therapy (Signed)
OUTPATIENT PHYSICAL THERAPY NEURO TREATMENT, Progress Note, and Recertification   Patient Name: Anthony [name] MRN: [id number] DOB:January 27, 1945, 77 y.o., male Today's Date: 11/21/2022   PCP: Gaspar Garbe, MD REFERRING PROVIDER: Butch Penny, NP  Progress Note Reporting Period 10/17/22 to 11/21/22  See note below for Objective Data and Assessment of Progress/Goals.      END OF SESSION:  PT End of Session - 11/21/22 1621     Visit Number 6    Number of Visits 10    Date for PT Re-Evaluation 12/19/22    Authorization Type [employer]    Progress Note Due on Visit 16    PT Start Time 1615    PT Stop Time 1700    PT Time Calculation (min) 45 min              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   Neuromuscular disorder (HCC) 2023   Parkinson's Disease   Neuropathy    left leg greater than right leg   Obesity (BMI 30.0-34.9) 05/14/2013   OSA on CPAP    cpap setting of 10/ 13   Pancreatitis dx march 2016   Pneumonia 12 years ago   Past Surgical History:  Procedure Laterality Date   ANTERIOR CERVICAL DECOMP/DISCECTOMY FUSION N/A 08/02/2022   Procedure: Anterior Cervical Decompression/Discectomy Fusion - Cervical Three-Cervical Four,  Cervical Four-Cervical Five,  remove Cervical Five-Cervical Six Plate;  Surgeon: Arman Bogus, MD;  Location: [name] OR;  Service: Neurosurgery;  Laterality: N/A;  3C   BACK SURGERY  10/2009   Cervical, arterior   CARPAL TUNNEL RELEASE Left 2003   CARPAL TUNNEL RELEASE  Bilateral    CATARACT EXTRACTION Bilateral 01/2012   COLONOSCOPY  2024   CORONARY BALLOON ANGIOPLASTY N/A 08/28/2021   Procedure: CORONARY BALLOON ANGIOPLASTY;  Surgeon: Elder Negus, MD;  Location: [facility];  Service: Cardiovascular;  Laterality: N/A;   EUS N/A 07/15/2014   Procedure: FULL UPPER ENDOSCOPIC ULTRASOUND (EUS) RADIAL;  Surgeon: Jeani Hawking, MD;  Location: [facility];  Service: Endoscopy;  Laterality: N/A;   LEFT HEART CATH AND CORONARY ANGIOGRAPHY N/A 05/18/2019   Procedure: LEFT HEART CATH AND CORONARY ANGIOGRAPHY;  Surgeon: Yates Decamp, MD;  Location: [facility];  Service: Cardiovascular;  Laterality: N/A;   LEFT HEART CATH AND CORONARY ANGIOGRAPHY N/A 08/28/2021   Procedure: LEFT HEART CATH AND CORONARY ANGIOGRAPHY;  Surgeon: Elder Negus, MD;  Location: [facility];  Service: Cardiovascular;  Laterality: N/A;   NASAL SINUS SURGERY  1981   RETINAL Bilateral 06/2013   Retinal peel   SHOULDER SURGERY Right 2003   TONSILLECTOMY  1954   Patient Active Problem List   Diagnosis Date Noted   S/P cervical spinal fusion 08/02/2022   Parkinsonism (HCC) 12/06/2021   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/06/2021   Hyperkalemia 12/06/2020   Coronary  artery disease of native artery of native heart with stable angina pectoris (HCC) 12/03/2019   Esophageal dysphagia 11/21/2019   Gastroesophageal reflux disease 11/21/2019   Family history of colon cancer 11/21/2019   Abnormal stress test 05/11/2019   Mixed hyperlipidemia 05/11/2019   Angina pectoris (HCC) 08/07/2018   Essential hypertension 08/07/2018   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, with long-term current use of insulin (HCC) 08/07/2018   Morbid obesity (HCC) 08/28/2017   Neuropathy 08/28/2017   Numbness and tingling of both legs below knees 11/10/2013   OSA on CPAP 05/14/2013   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   Obesity (BMI  30.0-34.9) 05/14/2013    ONSET DATE: 2023  REFERRING DIAG: R26.89 (ICD-10-CM) - Other abnormalities of gait and mobility  THERAPY DIAG:  Other abnormalities of gait and mobility  Unsteadiness on feet  Muscle weakness (generalized)  Rationale for Evaluation and Treatment: Rehabilitation  SUBJECTIVE:                                                                                                                                                                                             SUBJECTIVE STATEMENT: Neck has been doing better, it was a 1-2/10 this AM but got under air conditioning which aggravated it Pt accompanied by: self  PERTINENT HISTORY: DM, ACDF x 26 July 2022  PAIN:  Are you having pain? Yes: NPRS scale: 4/10 Pain location: right lateral neck Pain description: aching, popping Aggravating factors: right rotation Relieving factors: heating pad  PRECAUTIONS: None  RED FLAGS: None   WEIGHT BEARING RESTRICTIONS: No  FALLS: Has patient fallen in last 6 months? No  LIVING ENVIRONMENT: Lives with: lives with their family and lives with their spouse Lives in: House/apartment Stairs:  back steps from carport 4 stairs bilat HR Has following equipment at home: None  PLOF: Independent  PATIENT GOALS: Improve balance and mobility  OBJECTIVE:   TODAY'S TREATMENT: 10/31 Activity Comments  C-spine ROM Flexion: 35 Extension 30 Right/left lateral flexion 25 Left rotation 35 Right rotation 20  Mini-BESTest 24/28  5xSTS  11 sec  Gait speed 2.6 ft/sec  HEP review   Single leg stance w/ support  2x10 sec     TODAY'S TREATMENT: 11/14/22 Activity Comments  NU-step BLE only x 8 min resistance 5 Holding MHP to neck to address pain  Standing PWR moves To improve large amplitude mobility, flexibility, and coordination  Pt education Regarding local PD resources/exercise classes. Education regarding blood sugar/DM2 as it pertains to exercise intervention  Manual  therapy Soft tissue mobilization to reduce pain/guarding right cervical column. ROM cervical spine  to improve rotation ROM to improve comfort and safety for driving            PATIENT EDUCATION: Education details: re-assessment details Person educated: Patient Education method: Explanation Education comprehension: verbalized understanding  HOME EXERCISE PROGRAM: Access Code: [email or web address] URL: [email or web address] Date: 10/31/2022 Prepared by: Shary Decamp  Exercises - Sit to Stand with Arms Crossed  - 1 x daily - 7 x weekly - 5 sets - 5 reps - Standing Half Turn with Support  - 1 x daily - 7 x weekly - 1 sets - 10 reps - Supine Isometric Neck Extension  - 1 x daily - 7 x weekly - 1-3 sets - 10 reps - 2 sec hold - Supine Passive Cervical Retraction  - 1 x daily - 7 x weekly - 1-3 sets - 10 reps - Corner Balance Feet Together With Eyes Open  - 1 x daily - 7 x weekly - 3 sets - 30 sec hold - Corner Balance Feet Together With Eyes Closed  - 1 x daily - 7 x weekly - 3 sets - 10-30 sec hold - Corner Balance Feet Together: Eyes Open With Head Turns  - 1 x daily - 7 x weekly - 3 sets - 3 reps - Corner Balance Feet Together: Eyes Closed With Head Turns  - 1 x daily - 7 x weekly - 3 sets - 3 reps - Semi-Tandem Corner Balance With Eyes Open  - 1 x daily - 7 x weekly - 3 sets - 15-30 sec hold  - Side to Side Weight Shift with Overhead Reach and Counter Support  - 1 x daily - 7 x weekly - 1-3 sets - 10 reps - Staggered Stance Forward Backward Weight Shift with Counter Support  - 1 x daily - 7 x weekly - 1-3 sets - 10 reps - Standing Single Leg Stance with Counter Support  - 1 x daily - 7 x weekly - 3 sets - 10 sec hold    DIAGNOSTIC FINDINGS: N/A for episode  COGNITION: Overall cognitive status: Within functional limits for tasks assessed   SENSATION: Neuropathy in bilat LE to mid calf and notes some affecting LUE > RUE  COORDINATION: Difficulty with rapid alternating  movements  EDEMA:  none  MUSCLE TONE: NT    POSTURE: forward head and c-spine ROM limited  LOWER EXTREMITY ROM:   WFL  C-Spine ROM: Flexion 25 deg Extension 30 deg Right lateral flexion: 15 deg Left lateral flexion: 25 deg Right rotation: 15 deg Left rotation: 30 deg  (11/21/22) Flexion: 35 Extension 30 Right/left lateral flexion 25 Left rotation 35 Right rotation 20  LOWER EXTREMITY MMT:    Grossly 5/5 BLE  BED MOBILITY:  indep  TRANSFERS: Assistive device utilized: None  Sit to stand: Complete Independence Stand to sit: Complete Independence Chair to chair: Complete Independence Floor:  NT    CURB:  Level of Assistance: Complete Independence Assistive device utilized: None Curb Comments:   STAIRS: NT  GAIT: Gait pattern:  left foot slap at initial contact and wide BOS Distance walked:  Assistive device utilized: None Level of assistance: Complete Independence Comments:   FUNCTIONAL TESTS:  5 times sit to stand: 17 sec Mini-BESTest: 17/28  Gait speed: 14.28 sec = 2.29 ft/sec Freezing of Gait Questionnaire: 13/24       GOALS: Goals reviewed with patient? Yes  SHORT TERM GOALS: Target date: 12/05/2022      Patient will be independent in HEP to improve functional outcomes  and to address decreased cervical spine AROM Baseline: Goal status: MET  2.  Teach-back relevant strategies for freezing of gait Baseline: FoG Questionnaire 13/24 Goal status: IN PROGRESS  3. Decrease neck pain to 3/10 with rotation to improve comfort and safety with mobility  Baseline: 7/10; (11/07/22) 6/10; (11/21/22) 4/10  Goal status: IN PROGRESS  LONG TERM GOALS: Target date: 12/19/2022      Demo improved motor control and lower risk for falls per score 20/28 Mini-BESTest Baseline: 17/28; (11/21/22) 24/28  Goal status: MET  2.  Manifest improved balance and LE strength per time 15 sec 5xSTS test Baseline: 17 sec w/ retro-LOB; (11/21/22) 11 sec Goal  status: MET  3.  Report understanding of relevant community-level groups/classes for those with PD Baseline:  Goal status: IN PROGRESS  4.  Demo improved gait speed to 2.8 ft/sec to improve efficiency of community ambulation Baseline: 2.29 ft/sec; (11/21/22) 2.6 ft/sec Goal status: IN PROGRESS  5.  Improve single leg stance to 5 sec bilaterally to reduce risk for falls  Baseline: unable  Goal status: IN PROGRESS  ASSESSMENT:  CLINICAL IMPRESSION: Re-assessment of POC details demonstrating marked improvement in cervical ROM and reports overall decrease in pain with rotation.  Repeat of outcome measures with marked improvements in 5xSTS test and Mini-BESTest meeting goal status.  Continues to experience difficulty in balance and stability with multisensory demands and with single limb support with addition to HEP to address deficits.  Continues to experience difficulty with sudden change of direction during turns with instances of freezing and when performing large amplitude movements.  Pt would benefit from ongoing PT Services to address deficits and limitations.  He reports he and spouse are investigating local facilities for exercise classes and has been provided with information for community-resources/classes for those with PD.   OBJECTIVE IMPAIRMENTS: Abnormal gait, decreased activity tolerance, decreased balance, decreased coordination, decreased mobility, difficulty walking, decreased ROM, decreased strength, hypomobility, postural dysfunction, and pain.   ACTIVITY LIMITATIONS: lifting, bending, transfers, and locomotion level  PARTICIPATION LIMITATIONS: cleaning, laundry, driving, community activity, yard work, and exercise routine  PERSONAL FACTORS: Age, Time since onset of injury/illness/exacerbation, and 3+ comorbidities: PMH/cervical ACDF x 5  are also affecting patient's functional outcome.   REHAB POTENTIAL: Good  CLINICAL DECISION MAKING: Evolving/moderate  complexity  EVALUATION COMPLEXITY: Moderate  PLAN:  PT FREQUENCY: 1x/week  PT DURATION: 4 weeks  PLANNED INTERVENTIONS: Therapeutic exercises, Therapeutic activity, Neuromuscular re-education, Balance training, Gait training, Patient/Family education, Self Care, Joint mobilization, Stair training, Vestibular training, Canalith repositioning, DME instructions, Aquatic Therapy, Dry Needling, Electrical stimulation, Spinal mobilization, Cryotherapy, Moist heat, and Manual therapy  PLAN FOR NEXT SESSION: HEP review   5:07 PM, 11/21/22 M. Shary Decamp, PT, DPT Physical Therapist- [place] Office Number: [phone]

## 2022-11-28 ENCOUNTER — Ambulatory Visit: Payer: PPO | Attending: Neurology

## 2022-11-28 DIAGNOSIS — R2689 Other abnormalities of gait and mobility: Secondary | ICD-10-CM | POA: Diagnosis not present

## 2022-11-28 DIAGNOSIS — M6281 Muscle weakness (generalized): Secondary | ICD-10-CM | POA: Insufficient documentation

## 2022-11-28 DIAGNOSIS — R2681 Unsteadiness on feet: Secondary | ICD-10-CM | POA: Insufficient documentation

## 2022-11-28 DIAGNOSIS — M542 Cervicalgia: Secondary | ICD-10-CM | POA: Insufficient documentation

## 2022-11-28 NOTE — Therapy (Signed)
OUTPATIENT PHYSICAL THERAPY NEURO TREATMENT   Patient Name: Anthony [name] MRN: [id number] DOB:18-Feb-1945, 77 y.o., male Today's Date: 11/28/2022   PCP: Gaspar Garbe, MD REFERRING PROVIDER: Butch Penny, NP      END OF SESSION:  PT End of Session - 11/28/22 1625     Visit Number 7    Number of Visits 10    Date for PT Re-Evaluation 12/19/22    Authorization Type [employer]    Progress Note Due on Visit 16    PT Start Time 1615    PT Stop Time 1700    PT Time Calculation (min) 45 min              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   Neuromuscular disorder (HCC) 2023   Parkinson's Disease   Neuropathy    left leg greater than right leg   Obesity (BMI 30.0-34.9) 05/14/2013   OSA on CPAP    cpap setting of 10/ 13   Pancreatitis dx march 2016   Pneumonia 12 years ago   Past Surgical History:  Procedure Laterality Date   ANTERIOR CERVICAL DECOMP/DISCECTOMY FUSION N/A 08/02/2022   Procedure: Anterior Cervical Decompression/Discectomy Fusion - Cervical Three-Cervical Four,  Cervical Four-Cervical Five,  remove Cervical Five-Cervical Six Plate;  Surgeon: Arman Bogus, MD;  Location: [facility] OR;  Service: Neurosurgery;  Laterality: N/A;  3C   BACK SURGERY  10/2009   Cervical, arterior   CARPAL TUNNEL RELEASE Left 2003   CARPAL TUNNEL RELEASE Bilateral    CATARACT EXTRACTION Bilateral 01/2012   COLONOSCOPY  2024   CORONARY BALLOON ANGIOPLASTY N/A 08/28/2021   Procedure: CORONARY BALLOON ANGIOPLASTY;   Surgeon: Elder Negus, MD;  Location: [facility];  Service: Cardiovascular;  Laterality: N/A;   EUS N/A 07/15/2014   Procedure: FULL UPPER ENDOSCOPIC ULTRASOUND (EUS) RADIAL;  Surgeon: Jeani Hawking, MD;  Location: [facility];  Service: Endoscopy;  Laterality: N/A;   LEFT HEART CATH AND CORONARY ANGIOGRAPHY N/A 05/18/2019   Procedure: LEFT HEART CATH AND CORONARY ANGIOGRAPHY;  Surgeon: Yates Decamp, MD;  Location: [facility];  Service: Cardiovascular;  Laterality: N/A;   LEFT HEART CATH AND CORONARY ANGIOGRAPHY N/A 08/28/2021   Procedure: LEFT HEART CATH AND CORONARY ANGIOGRAPHY;  Surgeon: Elder Negus, MD;  Location: [facility];  Service: Cardiovascular;  Laterality: N/A;   NASAL SINUS SURGERY  1981   RETINAL Bilateral 06/2013   Retinal peel   SHOULDER SURGERY Right 2003   TONSILLECTOMY  1954   Patient Active Problem List   Diagnosis Date Noted   S/P cervical spinal fusion 08/02/2022   Parkinsonism (HCC) 12/06/2021   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/06/2021   Hyperkalemia 12/06/2020   Coronary artery disease of native artery of native heart with stable angina pectoris (HCC) 12/03/2019   Esophageal dysphagia 11/21/2019   Gastroesophageal reflux  disease 11/21/2019   Family history of colon cancer 11/21/2019   Abnormal stress test 05/11/2019   Mixed hyperlipidemia 05/11/2019   Angina pectoris (HCC) 08/07/2018   Essential hypertension 08/07/2018   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, with long-term current use of insulin (HCC) 08/07/2018   Morbid obesity (HCC) 08/28/2017   Neuropathy 08/28/2017   Numbness and tingling of both legs below knees 11/10/2013   OSA on CPAP 05/14/2013   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   Obesity (BMI 30.0-34.9) 05/14/2013    ONSET DATE: 2023  REFERRING DIAG: R26.89 (ICD-10-CM) - Other abnormalities of gait and mobility  THERAPY DIAG:  Other abnormalities  of gait and mobility  Unsteadiness on feet  Muscle weakness (generalized)  Cervicalgia  Rationale for Evaluation and Treatment: Rehabilitation  SUBJECTIVE:                                                                                                                                                                                             SUBJECTIVE STATEMENT: Neck is feeling better.  Pt accompanied by: self  PERTINENT HISTORY: DM, ACDF x 26 July 2022  PAIN:  Are you having pain? Yes: NPRS scale: 4/10 Pain location: right lateral neck Pain description: aching, popping Aggravating factors: right rotation Relieving factors: heating pad  PRECAUTIONS: None  RED FLAGS: None   WEIGHT BEARING RESTRICTIONS: No  FALLS: Has patient fallen in last 6 months? No  LIVING ENVIRONMENT: Lives with: lives with their family and lives with their spouse Lives in: House/apartment Stairs:  back steps from carport 4 stairs bilat HR Has following equipment at home: None  PLOF: Independent  PATIENT GOALS: Improve balance and mobility  OBJECTIVE:   TODAY'S TREATMENT: 11/28/22 Activity Comments  NU-step resistance intervals x 8 min 2 min warm-up 30 sec heavy; 30 sec light descending intervals 1 min cool-down  Standing PWR! moves 1x5 reps slow rehearsal. Proceeded to standing flow but noticeable unsteadiness  --glucose monitor alerting and checks reveal 56 mg/dL Provided snack and water and rest for re-check  Seated PWR moves 1x10 Cues in coordination             PATIENT EDUCATION: Education details: re-assessment details Person educated: Patient Education method: Explanation Education comprehension: verbalized understanding  HOME EXERCISE PROGRAM: Access Code: [email or web address] URL: [email or web address] Date: 10/31/2022 Prepared by: Shary Decamp  Exercises - Sit to Stand with Arms Crossed  - 1 x daily - 7 x weekly - 5 sets - 5 reps - Standing Half Turn with  Support  - 1 x daily - 7 x weekly - 1 sets - 10  reps - Supine Isometric Neck Extension  - 1 x daily - 7 x weekly - 1-3 sets - 10 reps - 2 sec hold - Supine Passive Cervical Retraction  - 1 x daily - 7 x weekly - 1-3 sets - 10 reps - Corner Balance Feet Together With Eyes Open  - 1 x daily - 7 x weekly - 3 sets - 30 sec hold - Corner Balance Feet Together With Eyes Closed  - 1 x daily - 7 x weekly - 3 sets - 10-30 sec hold - Corner Balance Feet Together: Eyes Open With Head Turns  - 1 x daily - 7 x weekly - 3 sets - 3 reps - Corner Balance Feet Together: Eyes Closed With Head Turns  - 1 x daily - 7 x weekly - 3 sets - 3 reps - Semi-Tandem Corner Balance With Eyes Open  - 1 x daily - 7 x weekly - 3 sets - 15-30 sec hold  - Side to Side Weight Shift with Overhead Reach and Counter Support  - 1 x daily - 7 x weekly - 1-3 sets - 10 reps - Staggered Stance Forward Backward Weight Shift with Counter Support  - 1 x daily - 7 x weekly - 1-3 sets - 10 reps - Standing Single Leg Stance with Counter Support  - 1 x daily - 7 x weekly - 3 sets - 10 sec hold    DIAGNOSTIC FINDINGS: N/A for episode  COGNITION: Overall cognitive status: Within functional limits for tasks assessed   SENSATION: Neuropathy in bilat LE to mid calf and notes some affecting LUE > RUE  COORDINATION: Difficulty with rapid alternating movements  EDEMA:  none  MUSCLE TONE: NT    POSTURE: forward head and c-spine ROM limited  LOWER EXTREMITY ROM:   WFL  C-Spine ROM: Flexion 25 deg Extension 30 deg Right lateral flexion: 15 deg Left lateral flexion: 25 deg Right rotation: 15 deg Left rotation: 30 deg  (11/21/22) Flexion: 35 Extension 30 Right/left lateral flexion 25 Left rotation 35 Right rotation 20  LOWER EXTREMITY MMT:    Grossly 5/5 BLE  BED MOBILITY:  indep  TRANSFERS: Assistive device utilized: None  Sit to stand: Complete Independence Stand to sit: Complete Independence Chair to chair: Complete  Independence Floor:  NT    CURB:  Level of Assistance: Complete Independence Assistive device utilized: None Curb Comments:   STAIRS: NT  GAIT: Gait pattern:  left foot slap at initial contact and wide BOS Distance walked:  Assistive device utilized: None Level of assistance: Complete Independence Comments:   FUNCTIONAL TESTS:  5 times sit to stand: 17 sec Mini-BESTest: 17/28  Gait speed: 14.28 sec = 2.29 ft/sec Freezing of Gait Questionnaire: 13/24       GOALS: Goals reviewed with patient? Yes  SHORT TERM GOALS: Target date: 12/05/2022      Patient will be independent in HEP to improve functional outcomes and to address decreased cervical spine AROM Baseline: Goal status: MET  2.  Teach-back relevant strategies for freezing of gait Baseline: FoG Questionnaire 13/24 Goal status: IN PROGRESS  3. Decrease neck pain to 3/10 with rotation to improve comfort and safety with mobility  Baseline: 7/10; (11/07/22) 6/10; (11/21/22) 4/10  Goal status: IN PROGRESS  LONG TERM GOALS: Target date: 12/19/2022      Demo improved motor control and lower risk for falls per score 20/28 Mini-BESTest Baseline: 17/28; (11/21/22) 24/28  Goal status: MET  2.  Manifest improved balance and  LE strength per time 15 sec 5xSTS test Baseline: 17 sec w/ retro-LOB; (11/21/22) 11 sec Goal status: MET  3.  Report understanding of relevant community-level groups/classes for those with PD Baseline:  Goal status: IN PROGRESS  4.  Demo improved gait speed to 2.8 ft/sec to improve efficiency of community ambulation Baseline: 2.29 ft/sec; (11/21/22) 2.6 ft/sec Goal status: IN PROGRESS  5.  Improve single leg stance to 5 sec bilaterally to reduce risk for falls  Baseline: unable  Goal status: IN PROGRESS  ASSESSMENT:  CLINICAL IMPRESSION: Session initiated with NU-step resistance intervals for HIIT to improve power and rapid alternating movements.  Continued with standing large  amplitude movements to improve coordination, flexibility, and motor control. Demo considerable unsteadiness with end-hold of positions with forward LOB on several occasions.  Pt glucose monitor alerting and demo hypoglycemia of 56. Provided snack and water and education regarding rationale of PWR moves and proceeded with 1x10 reps in sitting of large amplitude movements to trial as part of HEP. Continued sessions to progress POC details to improve mobility and reduce risk for falls.   OBJECTIVE IMPAIRMENTS: Abnormal gait, decreased activity tolerance, decreased balance, decreased coordination, decreased mobility, difficulty walking, decreased ROM, decreased strength, hypomobility, postural dysfunction, and pain.   ACTIVITY LIMITATIONS: lifting, bending, transfers, and locomotion level  PARTICIPATION LIMITATIONS: cleaning, laundry, driving, community activity, yard work, and exercise routine  PERSONAL FACTORS: Age, Time since onset of injury/illness/exacerbation, and 3+ comorbidities: PMH/cervical ACDF x 5  are also affecting patient's functional outcome.   REHAB POTENTIAL: Good  CLINICAL DECISION MAKING: Evolving/moderate complexity  EVALUATION COMPLEXITY: Moderate  PLAN:  PT FREQUENCY: 1x/week  PT DURATION: 4 weeks  PLANNED INTERVENTIONS: Therapeutic exercises, Therapeutic activity, Neuromuscular re-education, Balance training, Gait training, Patient/Family education, Self Care, Joint mobilization, Stair training, Vestibular training, Canalith repositioning, DME instructions, Aquatic Therapy, Dry Needling, Electrical stimulation, Spinal mobilization, Cryotherapy, Moist heat, and Manual therapy  PLAN FOR NEXT SESSION: seated PWR moves review, standing PWR moves if ready, treat neck pain accordingly   4:25 PM, 11/28/22 M. Shary Decamp, PT, DPT Physical Therapist- [place] Office Number: [phone]

## 2022-12-05 ENCOUNTER — Encounter: Payer: Self-pay | Admitting: Physical Therapy

## 2022-12-05 ENCOUNTER — Ambulatory Visit: Payer: PPO | Admitting: Physical Therapy

## 2022-12-05 DIAGNOSIS — R2689 Other abnormalities of gait and mobility: Secondary | ICD-10-CM

## 2022-12-05 DIAGNOSIS — R2681 Unsteadiness on feet: Secondary | ICD-10-CM

## 2022-12-05 DIAGNOSIS — M542 Cervicalgia: Secondary | ICD-10-CM

## 2022-12-05 DIAGNOSIS — M6281 Muscle weakness (generalized): Secondary | ICD-10-CM

## 2022-12-05 NOTE — Therapy (Signed)
OUTPATIENT PHYSICAL THERAPY NEURO TREATMENT   Patient Name: Anthony [name] MRN: [id number] DOB:March 02, 1945, 77 y.o., male Today's Date: 12/05/2022   PCP: Anthony Garbe, MD REFERRING PROVIDER: Butch Penny, NP      END OF SESSION:  PT End of Session - 12/05/22 1559     Visit Number 8    Number of Visits 10    Date for PT Re-Evaluation 12/19/22    Authorization Type [employer]    Progress Note Due on Visit 16    PT Start Time 1603    PT Stop Time 1645    PT Time Calculation (min) 42 min    Activity Tolerance Patient tolerated treatment well    Behavior During Therapy WFL for tasks assessed/performed               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   Neuromuscular disorder (HCC) 2023   Parkinson's Disease   Neuropathy    left leg greater than right leg   Obesity (BMI 30.0-34.9) 05/14/2013   OSA on CPAP    cpap setting of 10/ 13   Pancreatitis dx march 2016   Pneumonia 12 years ago   Past Surgical History:  Procedure Laterality Date   ANTERIOR CERVICAL DECOMP/DISCECTOMY FUSION N/A 08/02/2022   Procedure: Anterior Cervical Decompression/Discectomy Fusion - Cervical Three-Cervical Four,  Cervical Four-Cervical Five,  remove Cervical Five-Cervical Six Plate;  Surgeon: Anthony Bogus, MD;  Location: [facility] OR;  Service: Neurosurgery;  Laterality: N/A;  3C   BACK SURGERY  10/2009   Cervical, arterior   CARPAL TUNNEL RELEASE Left 2003   CARPAL TUNNEL RELEASE Bilateral    CATARACT EXTRACTION Bilateral  01/2012   COLONOSCOPY  2024   CORONARY BALLOON ANGIOPLASTY N/A 08/28/2021   Procedure: CORONARY BALLOON ANGIOPLASTY;  Surgeon: Anthony Negus, MD;  Location: [facility];  Service: Cardiovascular;  Laterality: N/A;   EUS N/A 07/15/2014   Procedure: FULL UPPER ENDOSCOPIC ULTRASOUND (EUS) RADIAL;  Surgeon: Anthony Hawking, MD;  Location: [facility];  Service: Endoscopy;  Laterality: N/A;   LEFT HEART CATH AND CORONARY ANGIOGRAPHY N/A 05/18/2019   Procedure: LEFT HEART CATH AND CORONARY ANGIOGRAPHY;  Surgeon: Anthony Decamp, MD;  Location: [facility];  Service: Cardiovascular;  Laterality: N/A;   LEFT HEART CATH AND CORONARY ANGIOGRAPHY N/A 08/28/2021   Procedure: LEFT HEART CATH AND CORONARY ANGIOGRAPHY;  Surgeon: Anthony Negus, MD;  Location: [facility];  Service: Cardiovascular;  Laterality: N/A;   NASAL SINUS SURGERY  1981   RETINAL Bilateral 06/2013   Retinal peel   SHOULDER SURGERY Right 2003   TONSILLECTOMY  1954   Patient Active Problem List   Diagnosis Date Noted   S/P cervical spinal fusion 08/02/2022   Parkinsonism (HCC) 12/06/2021   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/06/2021   Hyperkalemia 12/06/2020   Coronary 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) 12/03/2019   Esophageal dysphagia 11/21/2019   Gastroesophageal reflux disease 11/21/2019   Family history of colon cancer 11/21/2019   Abnormal stress test 05/11/2019   Mixed hyperlipidemia 05/11/2019   Angina pectoris (HCC) 08/07/2018   Essential hypertension 08/07/2018   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, with long-term current use of insulin (HCC) 08/07/2018   Morbid obesity (HCC) 08/28/2017   Neuropathy 08/28/2017   Numbness and tingling of both legs below knees 11/10/2013   OSA on CPAP 05/14/2013   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   Obesity (BMI 30.0-34.9) 05/14/2013    ONSET DATE:  2023  REFERRING DIAG: R26.89 (ICD-10-CM) - Other abnormalities of gait and mobility  THERAPY DIAG:  Other abnormalities of gait and mobility  Unsteadiness on feet  Muscle weakness (generalized)  Cervicalgia  Rationale for Evaluation and Treatment: Rehabilitation  SUBJECTIVE:                                                                                                                                                                                             SUBJECTIVE STATEMENT: The weather being cold and wet is aggravating my neck.  Went shopping with my wife yesterday and did well all day, except for one freezing episode.  Follow up with Dr. Yetta [name] about neck on 11/26.  Some days I can go without any neck pain. Pt accompanied by: self  PERTINENT HISTORY: DM, ACDF x 26 July 2022  PAIN:  Are you having pain? Yes: NPRS scale: 7/10 Pain location: right lateral neck Pain description: aching, popping Aggravating factors: right rotation Relieving factors: heating pad  PRECAUTIONS: None  RED FLAGS: None   WEIGHT BEARING RESTRICTIONS: No  FALLS: Has patient fallen in last 6 months? No  LIVING ENVIRONMENT: Lives with: lives with their family and lives with their spouse Lives in: House/apartment Stairs:  back steps from carport 4 stairs bilat HR Has following equipment at home: None  PLOF: Independent  PATIENT GOALS: Improve balance and mobility  OBJECTIVE:    TODAY'S TREATMENT: 12/05/2022 Activity Comments  NU-step resistance intervals x 8 min 2 min warm-up, 30 sec heavy, 1 min light; Level 7-8/Level 4  Forward/back walking x 2 minutes Decr foot clearance posterior direction  Sidestepping x 2 minutes   Forward walk with turns x 2 minutes   Monster walks forward x 2 min Difficulty with maintaining SLS, min guard  Negotiating obstacles-stepping over, stepping around Several initial episodes of not clearing obstacles  Review of wide BOS lateral weightshift and stagger  stance weightshifting Cues for technique  and optimal weightshift      PATIENT EDUCATION: Education details: Continue current HEP Person educated: Patient Education method: Explanation Education comprehension: verbalized understanding  HOME EXERCISE PROGRAM: Access Code: [email or web address] URL: [email or web address] Date: 10/31/2022 Prepared by: Shary [name]  Exercises - Sit to Stand with Arms Crossed  - 1 x daily - 7 x weekly - 5 sets - 5 reps - Standing Half Turn with Support  - 1 x daily - 7 x weekly - 1 sets - 10 reps - Supine Isometric Neck Extension  - 1 x daily - 7 x weekly - 1-3 sets - 10 reps - 2 sec hold - Supine Passive Cervical Retraction  - 1 x daily - 7 x weekly - 1-3 sets - 10 reps - Corner Balance Feet Together With Eyes Open  - 1 x daily - 7 x weekly - 3 sets - 30 sec hold - Corner Balance Feet Together With Eyes Closed  - 1 x daily - 7 x weekly - 3 sets - 10-30 sec hold - Corner Balance Feet Together: Eyes Open With Head Turns  - 1 x daily - 7 x weekly - 3 sets - 3 reps - Corner Balance Feet Together: Eyes Closed With Head Turns  - 1 x daily - 7 x weekly - 3 sets - 3 reps - Semi-Tandem Corner Balance With Eyes Open  - 1 x daily - 7 x weekly - 3 sets - 15-30 sec hold  - Side to Side Weight Shift with Overhead Reach and Counter Support  - 1 x daily - 7 x weekly - 1-3 sets - 10 reps - Staggered Stance Forward Backward Weight Shift with Counter Support  - 1 x daily - 7 x weekly - 1-3 sets - 10 reps - Standing Single Leg Stance with Counter Support  - 1 x daily - 7 x weekly - 3 sets - 10 sec hold    DIAGNOSTIC FINDINGS: N/A for episode  COGNITION: Overall cognitive status: Within functional limits for tasks assessed   SENSATION: Neuropathy in bilat LE to mid calf and notes some affecting LUE > RUE  COORDINATION: Difficulty with rapid alternating movements  EDEMA:  none  MUSCLE TONE: NT    POSTURE: forward head and c-spine ROM limited  LOWER  EXTREMITY ROM:   WFL  C-Spine ROM: Flexion 25 deg Extension 30 deg Right lateral flexion: 15 deg Left lateral flexion: 25 deg Right rotation: 15 deg Left rotation: 30 deg  (11/21/22) Flexion: 35 Extension 30 Right/left lateral flexion 25 Left rotation 35 Right rotation 20  LOWER EXTREMITY MMT:    Grossly 5/5 BLE  BED MOBILITY:  indep  TRANSFERS: Assistive device utilized: None  Sit to stand: Complete Independence Stand to sit: Complete Independence Chair to chair: Complete Independence Floor:  NT    CURB:  Level of Assistance: Complete Independence Assistive device utilized: None Curb Comments:   STAIRS: NT  GAIT: Gait pattern:  left foot slap at initial contact and wide BOS Distance walked:  Assistive device utilized: None Level of assistance: Complete Independence Comments:   FUNCTIONAL TESTS:  5 times sit to stand: 17 sec Mini-BESTest: 17/28  Gait speed: 14.28 sec = 2.29 ft/sec Freezing of Gait Questionnaire: 13/24       GOALS: Goals reviewed with patient? Yes  SHORT TERM GOALS: Target date: 12/05/2022      Patient will be independent in HEP to improve functional outcomes and to address decreased cervical spine AROM Baseline: Goal status: MET  2.  Teach-back relevant strategies for freezing of gait Baseline: FoG Questionnaire 13/24; verbalizes stop and reset with counting (12/05/2022) Goal status: IN PROGRESS  3. Decrease neck pain to 3/10 with rotation to improve comfort and safety with mobility  Baseline: 7/10; (11/07/22) 6/10; (11/21/22) 4/10; 12/05/2022 7/10  Goal status: IN PROGRESS  LONG TERM GOALS: Target date: 12/19/2022      Demo improved motor control and lower risk for falls per score 20/28 Mini-BESTest Baseline: 17/28; (11/21/22) 24/28  Goal status: MET  2.  Manifest improved balance and LE strength per time 15 sec 5xSTS test Baseline: 17 sec w/ retro-LOB; (11/21/22) 11 sec Goal status: MET  3.  Report  understanding of relevant community-level groups/classes for those with PD Baseline:  Goal status: IN PROGRESS  4.  Demo improved gait speed to 2.8 ft/sec to improve efficiency of community ambulation Baseline: 2.29 ft/sec; (11/21/22) 2.6 ft/sec Goal status: IN PROGRESS  5.  Improve single leg stance to 5 sec bilaterally to reduce risk for falls  Baseline: unable  Goal status: IN PROGRESS  ASSESSMENT:  CLINICAL IMPRESSION: Pt presents to OPPT today with reports of increased neck pain today, he thinks likely due to the cold, wet weather.  He does report overall decrease in neck pain, with some days having no neck pain.  He is able to verbalize/demo at least one strategy to reduce freezing of gait; no episodes of freezing noted in PT session today.  After aerobic activity with interval training on Nustep, worked on dynamic gait activities for most of remainder of session.  He does have decreased foot clearance at times, but no overt freezing episodes noted.  He will continue to benefit from continued skilled PT sessions to progress POC details to improve mobility and reduce risk for falls.   OBJECTIVE IMPAIRMENTS: Abnormal gait, decreased activity tolerance, decreased balance, decreased coordination, decreased mobility, difficulty walking, decreased ROM, decreased strength, hypomobility, postural dysfunction, and pain.   ACTIVITY LIMITATIONS: lifting, bending, transfers, and locomotion level  PARTICIPATION LIMITATIONS: cleaning, laundry, driving, community activity, yard work, and exercise routine  PERSONAL FACTORS: Age, Time since onset of injury/illness/exacerbation, and 3+ comorbidities: PMH/cervical ACDF x 5  are also affecting patient's functional outcome.   REHAB POTENTIAL: Good  CLINICAL DECISION MAKING: Evolving/moderate complexity  EVALUATION COMPLEXITY: Moderate  PLAN:  PT FREQUENCY: 1x/week  PT DURATION: 4 weeks  PLANNED INTERVENTIONS: Therapeutic exercises, Therapeutic  activity, Neuromuscular re-education, Balance training, Gait training, Patient/Family education, Self Care, Joint mobilization, Stair training, Vestibular training, Canalith repositioning, DME instructions, Aquatic Therapy, Dry Needling, Electrical stimulation, Spinal mobilization, Cryotherapy, Moist heat, and Manual therapy  PLAN FOR NEXT SESSION: Work towards LTGs; seated PWR moves review, standing PWR moves if ready, treat neck pain accordingly   Lonia Blood, PT 12/05/22 4:50 PM Phone: [phone] Fax: [phone]  [facility] at [facility] [address] [place], [place] [address] Phone # [phone] Fax # [phone]

## 2022-12-06 ENCOUNTER — Other Ambulatory Visit: Payer: Self-pay | Admitting: Cardiology

## 2022-12-06 ENCOUNTER — Other Ambulatory Visit: Payer: Self-pay | Admitting: Adult Health

## 2022-12-06 DIAGNOSIS — E782 Mixed hyperlipidemia: Secondary | ICD-10-CM

## 2022-12-06 DIAGNOSIS — I25118 Atherosclerotic heart disease of native coronary artery with other forms of angina pectoris: Secondary | ICD-10-CM

## 2022-12-09 ENCOUNTER — Telehealth: Payer: Self-pay | Admitting: Cardiology

## 2022-12-09 MED ORDER — METOPROLOL SUCCINATE ER 100 MG PO TB24: 100.0000 mg | ORAL_TABLET | Freq: Every day | ORAL | 2 refills | Status: DC

## 2022-12-09 NOTE — Telephone Encounter (Signed)
 RX sent to requested Pharmacy

## 2022-12-09 NOTE — Telephone Encounter (Signed)
*  STAT* If patient is at the pharmacy, call can be transferred to refill team.   1. Which medications need to be refilled? (please list name of each medication and dose if known)   metoprolol succinate (TOPROL-XL) 100 MG 24 hr tablet  Take 100 mg by mouth daily. Take with or immediately following a meal.    2. Would you like to learn more about the convenience, safety, & potential cost savings by using the [facility]? No   3. Are you open to using the [facility] No   4. Which pharmacy/location (including street and city if local pharmacy) is medication to be sent to?[id number] - [place], [place] - [address]    5. Do they need a 30 day or 90 day supply? 90 Day Supply   Pt is currently out of medication

## 2022-12-12 ENCOUNTER — Ambulatory Visit: Payer: PPO

## 2022-12-12 DIAGNOSIS — R2689 Other abnormalities of gait and mobility: Secondary | ICD-10-CM

## 2022-12-12 DIAGNOSIS — R2681 Unsteadiness on feet: Secondary | ICD-10-CM

## 2022-12-12 DIAGNOSIS — M542 Cervicalgia: Secondary | ICD-10-CM

## 2022-12-12 DIAGNOSIS — M6281 Muscle weakness (generalized): Secondary | ICD-10-CM

## 2022-12-12 NOTE — Therapy (Signed)
OUTPATIENT PHYSICAL THERAPY NEURO TREATMENT   Patient Name: Anthony [name] MRN: [id number] DOB:1945-09-20, 77 y.o., male Today's Date: 12/12/2022   PCP: Gaspar Garbe, MD REFERRING PROVIDER: Butch Penny, NP      END OF SESSION:  PT End of Session - 12/12/22 1619     Visit Number 9    Number of Visits 10    Date for PT Re-Evaluation 12/19/22    Authorization Type [employer]    Progress Note Due on Visit 16    PT Start Time 1615    PT Stop Time 1700    PT Time Calculation (min) 45 min    Activity Tolerance Patient tolerated treatment well    Behavior During Therapy WFL for tasks assessed/performed               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   Neuromuscular disorder (HCC) 2023   Parkinson's Disease   Neuropathy    left leg greater than right leg   Obesity (BMI 30.0-34.9) 05/14/2013   OSA on CPAP    cpap setting of 10/ 13   Pancreatitis dx march 2016   Pneumonia 12 years ago   Past Surgical History:  Procedure Laterality Date   ANTERIOR CERVICAL DECOMP/DISCECTOMY FUSION N/A 08/02/2022   Procedure: Anterior Cervical Decompression/Discectomy Fusion - Cervical Three-Cervical Four,  Cervical Four-Cervical Five,  remove Cervical Five-Cervical Six Plate;  Surgeon: Arman Bogus, MD;  Location: [facility] OR;  Service: Neurosurgery;  Laterality: N/A;  3C   BACK SURGERY  10/2009   Cervical, arterior   CARPAL TUNNEL RELEASE Left 2003   CARPAL TUNNEL RELEASE Bilateral    CATARACT EXTRACTION Bilateral  01/2012   COLONOSCOPY  2024   CORONARY BALLOON ANGIOPLASTY N/A 08/28/2021   Procedure: CORONARY BALLOON ANGIOPLASTY;  Surgeon: Elder Negus, MD;  Location: [facility];  Service: Cardiovascular;  Laterality: N/A;   EUS N/A 07/15/2014   Procedure: FULL UPPER ENDOSCOPIC ULTRASOUND (EUS) RADIAL;  Surgeon: Jeani Hawking, MD;  Location: [facility];  Service: Endoscopy;  Laterality: N/A;   LEFT HEART CATH AND CORONARY ANGIOGRAPHY N/A 05/18/2019   Procedure: LEFT HEART CATH AND CORONARY ANGIOGRAPHY;  Surgeon: Yates Decamp, MD;  Location: [facility];  Service: Cardiovascular;  Laterality: N/A;   LEFT HEART CATH AND CORONARY ANGIOGRAPHY N/A 08/28/2021   Procedure: LEFT HEART CATH AND CORONARY ANGIOGRAPHY;  Surgeon: Elder Negus, MD;  Location: [facility];  Service: Cardiovascular;  Laterality: N/A;   NASAL SINUS SURGERY  1981   RETINAL Bilateral 06/2013   Retinal peel   SHOULDER SURGERY Right 2003   TONSILLECTOMY  1954   Patient Active Problem List   Diagnosis Date Noted   S/P cervical spinal fusion 08/02/2022   Parkinsonism (HCC) 12/06/2021   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/06/2021   Hyperkalemia 12/06/2020   Coronary 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) 12/03/2019   Esophageal dysphagia 11/21/2019   Gastroesophageal reflux disease 11/21/2019   Family history of colon cancer 11/21/2019   Abnormal stress test 05/11/2019   Mixed hyperlipidemia 05/11/2019   Angina pectoris (HCC) 08/07/2018   Essential hypertension 08/07/2018   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, with long-term current use of insulin (HCC) 08/07/2018   Morbid obesity (HCC) 08/28/2017   Neuropathy 08/28/2017   Numbness and tingling of both legs below knees 11/10/2013   OSA on CPAP 05/14/2013   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   Obesity (BMI 30.0-34.9) 05/14/2013    ONSET DATE:  2023  REFERRING DIAG: R26.89 (ICD-10-CM) - Other abnormalities of gait and mobility  THERAPY DIAG:  Other abnormalities of gait and mobility  Unsteadiness on feet  Muscle weakness (generalized)  Cervicalgia  Rationale for Evaluation and Treatment: Rehabilitation  SUBJECTIVE:                                                                                                                                                                                             SUBJECTIVE STATEMENT: Doing ok.  The neck is aggravated from the cold weather Pt accompanied by: self  PERTINENT HISTORY: DM, ACDF x 26 July 2022  PAIN:  Are you having pain? Yes: NPRS scale: 5/10 Pain location: right lateral neck Pain description: aching, popping Aggravating factors: right rotation Relieving factors: heating pad  PRECAUTIONS: None  RED FLAGS: None   WEIGHT BEARING RESTRICTIONS: No  FALLS: Has patient fallen in last 6 months? No  LIVING ENVIRONMENT: Lives with: lives with their family and lives with their spouse Lives in: House/apartment Stairs:  back steps from carport 4 stairs bilat HR Has following equipment at home: None  PLOF: Independent  PATIENT GOALS: Improve balance and mobility  OBJECTIVE:   TODAY'S TREATMENT: 12/12/22 Activity Comments  NU-step LE only x 6 min, resistance level 4 For rapid LE movement maintaining 70-90 SPM  Single leg stance UE support 3x10 sec  4-square step 3x clockwise/counter 3x with dual-tasking 3x w/ mult-tasking  Multi-sensory balance  W/ dual-tasking demands  Backwards walking 2x50 ft CGA           TODAY'S TREATMENT: 12/05/2022 Activity Comments  NU-step resistance intervals x 8 min 2 min warm-up, 30 sec heavy, 1 min light; Level 7-8/Level 4  Forward/back walking x 2 minutes Decr foot clearance posterior direction  Sidestepping x 2 minutes   Forward walk with turns x 2 minutes   Monster walks forward x 2 min Difficulty with maintaining SLS,  min guard  Negotiating obstacles-stepping over, stepping around Several initial episodes of not clearing obstacles  Review of wide BOS lateral weightshift and stagger stance weightshifting Cues for technique and optimal weightshift      PATIENT EDUCATION: Education details: Continue current HEP Person educated: Patient Education method: Explanation Education comprehension: verbalized understanding  HOME EXERCISE PROGRAM: Access Code: [email or web address] URL: [email or web address] Date: 10/31/2022 Prepared by: Shary Decamp  Exercises - Sit to Stand with Arms Crossed  - 1 x daily - 7 x weekly - 5 sets - 5 reps - Standing Half Turn with Support  - 1 x daily - 7 x weekly - 1 sets - 10 reps - Supine Isometric Neck Extension  - 1 x daily - 7 x weekly - 1-3 sets - 10 reps - 2 sec hold - Supine Passive Cervical Retraction  - 1 x daily - 7 x weekly - 1-3 sets - 10 reps - Corner Balance Feet Together With Eyes Open  - 1 x daily - 7 x weekly - 3 sets - 30 sec hold - Corner Balance Feet Together With Eyes Closed  - 1 x daily - 7 x weekly - 3 sets - 10-30 sec hold - Corner Balance Feet Together: Eyes Open With Head Turns  - 1 x daily - 7 x weekly - 3 sets - 3 reps - Corner Balance Feet Together: Eyes Closed With Head Turns  - 1 x daily - 7 x weekly - 3 sets - 3 reps - Semi-Tandem Corner Balance With Eyes Open  - 1 x daily - 7 x weekly - 3 sets - 15-30 sec hold  - Side to Side Weight Shift with Overhead Reach and Counter Support  - 1 x daily - 7 x weekly - 1-3 sets - 10 reps - Staggered Stance Forward Backward Weight Shift with Counter Support  - 1 x daily - 7 x weekly - 1-3 sets - 10 reps - Standing Single Leg Stance with Counter Support  - 1 x daily - 7 x weekly - 3 sets - 10 sec hold    DIAGNOSTIC FINDINGS: N/A for episode  COGNITION: Overall cognitive status: Within functional limits for tasks assessed   SENSATION: Neuropathy in bilat LE to mid calf and notes some affecting LUE >  RUE  COORDINATION: Difficulty with rapid alternating movements  EDEMA:  none  MUSCLE TONE: NT    POSTURE: forward head and c-spine ROM limited  LOWER EXTREMITY ROM:   WFL  C-Spine ROM: Flexion 25 deg Extension 30 deg Right lateral flexion: 15 deg Left lateral flexion: 25 deg Right rotation: 15 deg Left rotation: 30 deg  (11/21/22) Flexion: 35 Extension 30 Right/left lateral flexion 25 Left rotation 35 Right rotation 20  LOWER EXTREMITY MMT:    Grossly 5/5 BLE  BED MOBILITY:  indep  TRANSFERS: Assistive device utilized: None  Sit to stand: Complete Independence Stand to sit: Complete Independence Chair to chair: Complete Independence Floor:  NT    CURB:  Level of Assistance: Complete Independence Assistive device utilized: None Curb Comments:   STAIRS: NT  GAIT: Gait pattern:  left foot slap at initial contact and wide BOS Distance walked:  Assistive device utilized: None Level of assistance: Complete Independence Comments:   FUNCTIONAL TESTS:  5 times sit to stand: 17 sec Mini-BESTest: 17/28  Gait speed: 14.28 sec = 2.29 ft/sec Freezing of Gait Questionnaire: 13/24       GOALS: Goals reviewed with patient? Yes  SHORT TERM GOALS: Target date: 12/05/2022  Patient will be independent in HEP to improve functional outcomes and to address decreased cervical spine AROM Baseline: Goal status: MET  2.  Teach-back relevant strategies for freezing of gait Baseline: FoG Questionnaire 13/24; verbalizes stop and reset with counting (12/05/2022) Goal status: IN PROGRESS  3. Decrease neck pain to 3/10 with rotation to improve comfort and safety with mobility  Baseline: 7/10; (11/07/22) 6/10; (11/21/22) 4/10; 12/05/2022 7/10  Goal status: IN PROGRESS  LONG TERM GOALS: Target date: 12/19/2022      Demo improved motor control and lower risk for falls per score 20/28 Mini-BESTest Baseline: 17/28; (11/21/22) 24/28  Goal status: MET  2.   Manifest improved balance and LE strength per time 15 sec 5xSTS test Baseline: 17 sec w/ retro-LOB; (11/21/22) 11 sec Goal status: MET  3.  Report understanding of relevant community-level groups/classes for those with PD Baseline:  Goal status: IN PROGRESS  4.  Demo improved gait speed to 2.8 ft/sec to improve efficiency of community ambulation Baseline: 2.29 ft/sec; (11/21/22) 2.6 ft/sec Goal status: IN PROGRESS  5.  Improve single leg stance to 5 sec bilaterally to reduce risk for falls  Baseline: unable  Goal status: IN PROGRESS  ASSESSMENT:  CLINICAL IMPRESSION: Initiated session with NU-step LE only to improve rapid alternating LE movements with cues in sustaining SPM with good tolerance.  Continued with motor control training to enhance single limb stability and change of direction imposing dual and multi-tasking demands to improve coordination and to impose greater demands to balance.  Difficulty with stability under dual-tasking demands espeically with movement in posterior direction exhibiting decreased foot clearance and greater imbalance.  Instructed in task decomposition and structuring to improve coordination between task and cognitive/multi-task demands with improved stability thereafter by segmenting task.  Multisensory balance challenges to the same effect to improve postural stability and facilitate righting reactions.  Pt verbalizes intention to start at [facility] for some of there PD-class offerings.  Will re-assess and anticipate D/C to HEP and community programs  OBJECTIVE IMPAIRMENTS: Abnormal gait, decreased activity tolerance, decreased balance, decreased coordination, decreased mobility, difficulty walking, decreased ROM, decreased strength, hypomobility, postural dysfunction, and pain.   ACTIVITY LIMITATIONS: lifting, bending, transfers, and locomotion level  PARTICIPATION LIMITATIONS: cleaning, laundry, driving, community activity, yard work, and exercise  routine  PERSONAL FACTORS: Age, Time since onset of injury/illness/exacerbation, and 3+ comorbidities: PMH/cervical ACDF x 5  are also affecting patient's functional outcome.   REHAB POTENTIAL: Good  CLINICAL DECISION MAKING: Evolving/moderate complexity  EVALUATION COMPLEXITY: Moderate  PLAN:  PT FREQUENCY: 1x/week  PT DURATION: 4 weeks  PLANNED INTERVENTIONS: Therapeutic exercises, Therapeutic activity, Neuromuscular re-education, Balance training, Gait training, Patient/Family education, Self Care, Joint mobilization, Stair training, Vestibular training, Canalith repositioning, DME instructions, Aquatic Therapy, Dry Needling, Electrical stimulation, Spinal mobilization, Cryotherapy, Moist heat, and Manual therapy  PLAN FOR NEXT SESSION: D/C assessment   5:05 PM, 12/12/22 M. Shary Decamp, PT, DPT Physical Therapist- [place] Office Number: [phone]

## 2022-12-17 ENCOUNTER — Ambulatory Visit: Payer: PPO | Admitting: Physical Therapy

## 2022-12-17 DIAGNOSIS — E1165 Type 2 diabetes mellitus with hyperglycemia: Secondary | ICD-10-CM | POA: Diagnosis not present

## 2022-12-17 DIAGNOSIS — Z6833 Body mass index (BMI) 33.0-33.9, adult: Secondary | ICD-10-CM | POA: Diagnosis not present

## 2022-12-17 DIAGNOSIS — M5412 Radiculopathy, cervical region: Secondary | ICD-10-CM | POA: Diagnosis not present

## 2022-12-18 ENCOUNTER — Ambulatory Visit: Payer: PPO | Admitting: Physical Therapy

## 2022-12-18 ENCOUNTER — Ambulatory Visit: Payer: PPO

## 2022-12-18 DIAGNOSIS — R2681 Unsteadiness on feet: Secondary | ICD-10-CM

## 2022-12-18 DIAGNOSIS — R2689 Other abnormalities of gait and mobility: Secondary | ICD-10-CM | POA: Diagnosis not present

## 2022-12-18 DIAGNOSIS — M542 Cervicalgia: Secondary | ICD-10-CM

## 2022-12-18 DIAGNOSIS — M6281 Muscle weakness (generalized): Secondary | ICD-10-CM

## 2022-12-18 NOTE — Therapy (Signed)
OUTPATIENT PHYSICAL THERAPY NEURO TREATMENT and D/C Summary   Patient Name: Anthony [name] MRN: [id number] DOB:1945/07/20, 77 y.o., male Today's Date: 12/18/2022   PCP: Gaspar Garbe, MD REFERRING PROVIDER: Butch Penny, NP PHYSICAL THERAPY DISCHARGE SUMMARY  Visits from Start of Care: 10  Current functional level related to goals / functional outcomes: See details below   Remaining deficits: Instances of freezing of gait during turns   Education / Equipment: HEP   Patient agrees to discharge. Patient goals were partially met. Patient is being discharged due to meeting the stated rehab goals.   END OF SESSION:  PT End of Session - 12/18/22 1617     Visit Number 10    Number of Visits 10    Date for PT Re-Evaluation 12/19/22    Authorization Type [employer]    Progress Note Due on Visit 16    PT Start Time 1615    PT Stop Time 1700    PT Time Calculation (min) 45 min    Activity Tolerance Patient tolerated treatment well    Behavior During Therapy WFL for tasks assessed/performed               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   Neuromuscular disorder (HCC) 2023   Parkinson's Disease   Neuropathy    left leg greater than right leg   Obesity (BMI 30.0-34.9) 05/14/2013   OSA on CPAP    cpap setting of 10/ 13   Pancreatitis dx march 2016   Pneumonia 12 years ago   Past Surgical History:  Procedure Laterality Date   ANTERIOR CERVICAL DECOMP/DISCECTOMY FUSION N/A 08/02/2022   Procedure:  Anterior Cervical Decompression/Discectomy Fusion - Cervical Three-Cervical Four,  Cervical Four-Cervical Five,  remove Cervical Five-Cervical Six Plate;  Surgeon: Arman Bogus, MD;  Location: [facility] OR;  Service: Neurosurgery;  Laterality: N/A;  3C   BACK SURGERY  10/2009   Cervical, arterior   CARPAL TUNNEL RELEASE Left 2003   CARPAL TUNNEL RELEASE Bilateral    CATARACT EXTRACTION Bilateral 01/2012   COLONOSCOPY  2024   CORONARY BALLOON ANGIOPLASTY N/A 08/28/2021   Procedure: CORONARY BALLOON ANGIOPLASTY;  Surgeon: Elder Negus, MD;  Location: [facility];  Service: Cardiovascular;  Laterality: N/A;   EUS N/A 07/15/2014   Procedure: FULL UPPER ENDOSCOPIC ULTRASOUND (EUS) RADIAL;  Surgeon: Jeani Hawking, MD;  Location: [facility];  Service: Endoscopy;  Laterality: N/A;   LEFT HEART CATH AND CORONARY ANGIOGRAPHY N/A 05/18/2019   Procedure: LEFT HEART CATH AND CORONARY ANGIOGRAPHY;  Surgeon: Yates Decamp, MD;  Location: [facility];  Service: Cardiovascular;  Laterality: N/A;   LEFT HEART CATH AND CORONARY ANGIOGRAPHY N/A 08/28/2021   Procedure: LEFT HEART CATH AND CORONARY ANGIOGRAPHY;  Surgeon: Elder Negus, MD;  Location: [facility];  Service: Cardiovascular;  Laterality: N/A;   NASAL SINUS SURGERY  1981   RETINAL Bilateral 06/2013   Retinal peel   SHOULDER SURGERY Right 2003   TONSILLECTOMY  1954   Patient Active Problem List   Diagnosis Date Noted   S/P cervical spinal fusion 08/02/2022   Parkinsonism (HCC) 12/06/2021   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/06/2021   Hyperkalemia 12/06/2020   Coronary artery disease of native artery of native heart with stable angina pectoris (HCC) 12/03/2019   Esophageal dysphagia 11/21/2019   Gastroesophageal reflux disease 11/21/2019   Family history of colon cancer 11/21/2019   Abnormal stress test 05/11/2019   Mixed hyperlipidemia 05/11/2019   Angina pectoris (HCC) 08/07/2018   Essential  hypertension 08/07/2018   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, with long-term current use of insulin (HCC) 08/07/2018   Morbid obesity (HCC) 08/28/2017   Neuropathy 08/28/2017   Numbness and tingling of both legs below knees 11/10/2013   OSA on CPAP 05/14/2013   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   Obesity (BMI 30.0-34.9) 05/14/2013    ONSET DATE: 2023  REFERRING DIAG: R26.89 (ICD-10-CM) - Other abnormalities of gait and mobility  THERAPY DIAG:  Other abnormalities of gait and mobility  Unsteadiness on feet  Muscle weakness (generalized)  Cervicalgia  Rationale for Evaluation and Treatment: Rehabilitation  SUBJECTIVE:                                                                                                                                                                                             SUBJECTIVE STATEMENT: Got the clearance from neuro-surgeon the neck is healing.  Pt accompanied by: self  PERTINENT HISTORY: DM, ACDF x 26 July 2022  PAIN:  Are you having pain? Yes: NPRS scale: 5/10 Pain location: right lateral neck Pain description: aching, popping Aggravating factors: right rotation Relieving factors: heating pad  PRECAUTIONS: None  RED FLAGS: None   WEIGHT BEARING RESTRICTIONS: No  FALLS: Has patient fallen in last 6 months? No  LIVING ENVIRONMENT: Lives with: lives with their family and lives with their spouse Lives in: House/apartment Stairs:  back steps from carport 4 stairs bilat HR Has following equipment at home: None  PLOF: Independent  PATIENT GOALS: Improve balance and mobility  OBJECTIVE:   TODAY'S TREATMENT: 12/18/22 Activity Comments  10 meter backwards walk 0.4 meters/sec  10 meters walk test 3.2 ft/sec  POC details review   Discussion of freezing of gait strategies            TODAY'S TREATMENT: 12/12/22 Activity Comments  NU-step LE only x 6 min, resistance level 4 For  rapid LE movement maintaining 70-90 SPM  Single leg stance UE support 3x10 sec  4-square step 3x clockwise/counter 3x with dual-tasking 3x w/ mult-tasking  Multi-sensory balance  W/ dual-tasking demands  Backwards walking 2x50 ft CGA               PATIENT EDUCATION: Education details: Continue current HEP Person educated: Patient Education method: Explanation Education comprehension: verbalized understanding  HOME EXERCISE PROGRAM: Access Code: [email or web address] URL: [email or web address] Date: 10/31/2022 Prepared by: Shary Decamp  Exercises - Sit to Stand with Arms Crossed  - 1 x daily - 7 x weekly - 5 sets - 5 reps - Standing Half Turn with Support  - 1 x daily - 7 x weekly - 1 sets - 10 reps - Supine Isometric Neck Extension  - 1 x daily - 7 x weekly - 1-3 sets - 10 reps - 2 sec hold - Supine Passive Cervical Retraction  - 1 x daily - 7 x weekly - 1-3 sets - 10 reps - Corner Balance Feet Together With Eyes Open  - 1 x daily - 7 x weekly - 3 sets - 30 sec hold - Corner Balance Feet Together With Eyes Closed  - 1 x daily - 7 x weekly - 3 sets - 10-30 sec hold - Corner Balance Feet Together: Eyes Open With Head Turns  - 1 x daily - 7 x weekly - 3 sets - 3 reps - Corner Balance Feet Together: Eyes Closed With Head Turns  - 1 x daily - 7 x weekly - 3 sets - 3 reps - Semi-Tandem Corner Balance With Eyes Open  - 1 x daily - 7 x weekly - 3 sets - 15-30 sec hold  - Side to Side Weight Shift with Overhead Reach and Counter Support  - 1 x daily - 7 x weekly - 1-3 sets - 10 reps - Staggered Stance Forward Backward Weight Shift with Counter Support  - 1 x daily - 7 x weekly - 1-3 sets - 10 reps - Standing Single Leg Stance with Counter Support  - 1 x daily - 7 x weekly - 3 sets - 10 sec hold    DIAGNOSTIC FINDINGS: N/A for episode  COGNITION: Overall cognitive status: Within functional limits for tasks assessed   SENSATION: Neuropathy in bilat LE to mid calf and notes some  affecting LUE > RUE  COORDINATION: Difficulty with rapid alternating movements  EDEMA:  none  MUSCLE TONE: NT    POSTURE: forward head and c-spine ROM limited  LOWER EXTREMITY ROM:   WFL  C-Spine ROM: Flexion 25 deg Extension 30 deg Right lateral flexion: 15 deg Left lateral flexion: 25 deg Right rotation: 15 deg Left rotation: 30 deg  (11/21/22) Flexion: 35 Extension 30 Right/left lateral flexion 25 Left rotation 35 Right rotation 20  LOWER EXTREMITY MMT:    Grossly 5/5 BLE  BED MOBILITY:  indep  TRANSFERS: Assistive device utilized: None  Sit to stand: Complete Independence Stand to sit: Complete Independence Chair to chair: Complete Independence Floor:  NT    CURB:  Level of Assistance: Complete Independence Assistive device utilized: None Curb Comments:   STAIRS: NT  GAIT: Gait pattern:  left foot slap at initial contact and wide BOS Distance walked:  Assistive device utilized: None Level of assistance: Complete Independence Comments:   FUNCTIONAL TESTS:  5 times sit to stand: 17 sec Mini-BESTest: 17/28  Gait speed: 14.28 sec = 2.29 ft/sec Freezing of Gait Questionnaire: 13/24       GOALS: Goals reviewed with  patient? Yes  SHORT TERM GOALS: Target date: 12/05/2022      Patient will be independent in HEP to improve functional outcomes and to address decreased cervical spine AROM Baseline: Goal status: MET  2.  Teach-back relevant strategies for freezing of gait Baseline: FoG Questionnaire 13/24; verbalizes stop and reset with counting (12/05/2022) Goal status: MET  3. Decrease neck pain to 3/10 with rotation to improve comfort and safety with mobility  Baseline: 7/10; (11/07/22) 6/10; (11/21/22) 4/10; 12/05/2022 7/10; (12/18/22) 2-3/10  Goal status: MET  LONG TERM GOALS: Target date: 12/19/2022      Demo improved motor control and lower risk for falls per score 20/28 Mini-BESTest Baseline: 17/28; (11/21/22) 24/28   Goal status: MET  2.  Manifest improved balance and LE strength per time 15 sec 5xSTS test Baseline: 17 sec w/ retro-LOB; (11/21/22) 11 sec Goal status: MET  3.  Report understanding of relevant community-level groups/classes for those with PD Baseline:  Goal status: MET  4.  Demo improved gait speed to 2.8 ft/sec to improve efficiency of community ambulation Baseline: 2.29 ft/sec; (11/21/22) 2.6 ft/sec; (12/18/22) 3.2 ft/sec Goal status: MET  5.  Improve single leg stance to 5 sec bilaterally to reduce risk for falls  Baseline: 2-3 sec  Goal status: NOT MET  ASSESSMENT:  CLINICAL IMPRESSION: Tests and measures performed with improved walking speed since start of care from 2.29 to now 3.2 ft/sec self-selected walking speed.  Improved single leg balance from unable to 2-3 sec before support required.  10 meter backwards walk reveals speed of 0.4 m/s indicating increased risk for falls.  Able to meet other POC requirements.  Pt indicates he intends to start independent fitness routine at local facility.  Review of strategies for freezing of gait with teach-back of several means. D/C to HEP at this time and f/u in 6 months with screen  OBJECTIVE IMPAIRMENTS: Abnormal gait, decreased activity tolerance, decreased balance, decreased coordination, decreased mobility, difficulty walking, decreased ROM, decreased strength, hypomobility, postural dysfunction, and pain.   ACTIVITY LIMITATIONS: lifting, bending, transfers, and locomotion level  PARTICIPATION LIMITATIONS: cleaning, laundry, driving, community activity, yard work, and exercise routine  PERSONAL FACTORS: Age, Time since onset of injury/illness/exacerbation, and 3+ comorbidities: PMH/cervical ACDF x 5  are also affecting patient's functional outcome.   REHAB POTENTIAL: Good  CLINICAL DECISION MAKING: Evolving/moderate complexity  EVALUATION COMPLEXITY: Moderate  PLAN:  PT FREQUENCY: 1x/week  PT DURATION: 4  weeks  PLANNED INTERVENTIONS: Therapeutic exercises, Therapeutic activity, Neuromuscular re-education, Balance training, Gait training, Patient/Family education, Self Care, Joint mobilization, Stair training, Vestibular training, Canalith repositioning, DME instructions, Aquatic Therapy, Dry Needling, Electrical stimulation, Spinal mobilization, Cryotherapy, Moist heat, and Manual therapy  PLAN FOR NEXT SESSION: f/u screen x 6 months   4:18 PM, 12/18/22 M. Shary Decamp, PT, DPT Physical Therapist- [place] Office Number: [phone]

## 2022-12-26 ENCOUNTER — Encounter: Payer: Self-pay | Admitting: Neurology

## 2022-12-26 ENCOUNTER — Ambulatory Visit: Payer: PPO | Admitting: Neurology

## 2022-12-26 VITALS — BP 140/68 | HR 55 | Ht 70.0 in | Wt 233.0 lb

## 2022-12-26 DIAGNOSIS — G20A1 Parkinson's disease without dyskinesia, without mention of fluctuations: Secondary | ICD-10-CM

## 2022-12-26 DIAGNOSIS — R0981 Nasal congestion: Secondary | ICD-10-CM

## 2022-12-26 DIAGNOSIS — G4710 Hypersomnia, unspecified: Secondary | ICD-10-CM | POA: Insufficient documentation

## 2022-12-26 DIAGNOSIS — F4310 Post-traumatic stress disorder, unspecified: Secondary | ICD-10-CM

## 2022-12-26 DIAGNOSIS — G4733 Obstructive sleep apnea (adult) (pediatric): Secondary | ICD-10-CM

## 2022-12-26 MED ORDER — KETOCONAZOLE 2 % EX SHAM: 1.0000 | MEDICATED_SHAMPOO | CUTANEOUS | 0 refills | Status: DC

## 2022-12-26 MED ORDER — KETOCONAZOLE 2 % EX CREA: 1.0000 | TOPICAL_CREAM | Freq: Every day | CUTANEOUS | 5 refills | Status: DC | PRN

## 2022-12-26 NOTE — Progress Notes (Signed)
Provider:  Melvyn Novas, MD  Primary Care Physician:  Gaspar Garbe, MD [address] [place] [address]     Referring Provider: Gaspar Garbe, Md [address] [place],  [place] [address]          Chief Complaint according to patient   Patient presents with:     New Patient (Initial Visit)           HISTORY OF PRESENT ILLNESS:  Anthony [name] is a 77 y.o. male patient who is here for revisit 12/26/2022 for  persistent hypersomnia while on CPAP. The patient has PD  and diabetes, gets many of his medications from the [place].  he has struggled wit high insulin costs and relies on jardiance as well. His CPAP is working OK for him,  highly compliant he had some higher residual AHis but under 10/ h. But his air leakage is very, very high on a ResMed P 10 pillow. That one has flimsy headgear. He reported;y sleeps 8.5 hours at night and additional naps throughout the day when he watches Tv, for example.    Chief concern according to patient :  taking cat naps, large air leak. PD may cause some sleepiness too and central apneas.   Acting out dreams was seen last year, identified as PTSD per [place] records, and not REM BD - which is likely to occur in a PD patient.  Screaming, kicking, fighting. Has once fallen out of bed. The medication has reduced this significantly.         MM Today 06/26/22:   Anthony [name] is a 77 y.o. male with a history of obstructive sleep apnea on CPAP and Parkinson disease. Returns today for follow-up.  He reports that the CPAP is working well although he just changed out his supplies and he feels that the hose and water chamber is not working correctly.   PD: tremor comes and goes. Primarily in the LUE. Just recently did PT. Reports in the mornings he feels off balance.  Feels this is related to neuropathy. No falls. No trouble swallowing. Reports some acting out dreams- relates to PTSD.  [facility] and now only once a week.     '     Review of Systems: Out of a complete 14 system review, the patient complains of only the following symptoms, and all other reviewed systems are negative.:  Fatigue, sleepiness , snoring, parasomnia/ PTSD / Tremor   Seborrhoic dermatitis,  Ear wax, excessive.     How likely are you to doze in the following situations: 0 = not likely, 1 = slight chance, 2 = moderate chance, 3 = high chance   Sitting and Reading? Watching Television? Sitting inactive in a public place (theater or meeting)? As a passenger in a car for an hour without a break? Lying down in the afternoon when circumstances permit? Sitting and talking to someone? Sitting quietly after lunch without alcohol? In a car, while stopped for a few minutes in traffic?   Total = 19/ 24 points   FSS endorsed at 44/ 63 points.   GDS : 5-15.   Social History   Socioeconomic History   Marital status: Married    Spouse name: Rosey Bath   Number of children: 2   Years of education: 12   Highest education level: Not on file  Occupational History   Occupation: [employer]    Employer: [employer]  Tobacco Use  Smoking status: Never   Smokeless tobacco: Never  Vaping Use   Vaping status: Never Used  Substance and Sexual Activity   Alcohol use: No   Drug use: No   Sexual activity: Not Currently  Other Topics Concern   Not on file  Social History Narrative   Patient is married Rosey Bath) and lives at home with his wife.   Patient has two children (twins).   Patient is retired.   Patient has a high school education.   Patient does not drink any caffeine.   Patient is left-handed.   Social Determinants of Health   Financial Resource Strain: Not on file  Food Insecurity: No Food Insecurity (08/03/2022)   Hunger Vital Sign    Worried About Running Out of Food in the Last Year: Never true    Ran Out of Food in the Last Year: Never true  Transportation Needs: No Transportation Needs (08/03/2022)   PRAPARE -  Administrator, Civil Service (Medical): No    Lack of Transportation (Non-Medical): No  Physical Activity: Not on file  Stress: Not on file  Social Connections: Unknown (06/04/2021)   Received from [facility]   Social Network    Social Network: Not on file    Family History  Problem Relation Age of Onset   Heart disease Mother    Diabetes Mother    Neuropathy Mother    Colon cancer Father    Diabetes Sister    Lung cancer Brother    Sleep apnea Brother    Colon cancer Brother 7   Diabetes Maternal Grandmother    Heart attack Maternal Grandmother    Colon cancer Maternal Grandfather    Heart Problems Maternal Grandfather    Prostate cancer Maternal Grandfather    Cancer Maternal Grandfather        right eye   Heart attack Maternal Grandfather    Stomach cancer Neg Hx    Ulcerative colitis Neg Hx    Esophageal cancer Neg Hx    Parkinson's disease Neg Hx     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   PD(HCC) 2023   Parkinson's Disease   Neuropathy    left leg greater than right leg   Obesity (BMI 30.0-34.9) 05/14/2013   OSA on CPAP    cpap setting of 10/ 13   Pancreatitis dx march 2016   Pneumonia 12 years ago    Past Surgical History:  Procedure Laterality Date   ANTERIOR CERVICAL DECOMP/DISCECTOMY FUSION N/A 08/02/2022   Procedure: Anterior Cervical Decompression/Discectomy Fusion - Cervical Three-Cervical Four,  Cervical Four-Cervical Five,  remove Cervical Five-Cervical Six Plate;  Surgeon:  Arman Bogus, MD;  Location: [facility] OR;  Service: Neurosurgery;  Laterality: N/A;  3C   BACK SURGERY  10/2009   Cervical, arterior   CARPAL TUNNEL RELEASE Left 2003   CARPAL TUNNEL RELEASE Bilateral  CATARACT EXTRACTION Bilateral 01/2012   COLONOSCOPY  2024   CORONARY BALLOON ANGIOPLASTY N/A 08/28/2021   Procedure: CORONARY BALLOON ANGIOPLASTY;  Surgeon: Elder Negus, MD;  Location: [facility];  Service: Cardiovascular;  Laterality: N/A;   EUS N/A 07/15/2014   Procedure: FULL UPPER ENDOSCOPIC ULTRASOUND (EUS) RADIAL;  Surgeon: Jeani Hawking, MD;  Location: [facility];  Service: Endoscopy;  Laterality: N/A;   LEFT HEART CATH AND CORONARY ANGIOGRAPHY N/A 05/18/2019   Procedure: LEFT HEART CATH AND CORONARY ANGIOGRAPHY;  Surgeon: Yates Decamp, MD;  Location: [facility];  Service: Cardiovascular;  Laterality: N/A;   LEFT HEART CATH AND CORONARY ANGIOGRAPHY N/A 08/28/2021   Procedure: LEFT HEART CATH AND CORONARY ANGIOGRAPHY;  Surgeon: Elder Negus, MD;  Location: [facility];  Service: Cardiovascular;  Laterality: N/A;   NASAL SINUS SURGERY  1981   RETINAL Bilateral 06/2013   Retinal peel   SHOULDER SURGERY Right 2003   TONSILLECTOMY  1954     Current Outpatient Medications on File Prior to Visit  Medication Sig Dispense Refill   acetaminophen (TYLENOL) 500 MG tablet Take 500 mg by mouth every 6 (six) hours as needed for moderate pain.     albuterol (VENTOLIN HFA) 108 (90 Base) MCG/ACT inhaler Inhale 1 puff into the lungs every 4 (four) hours as needed for wheezing or shortness of breath.     allopurinol (ZYLOPRIM) 300 MG tablet Take 300 mg by mouth daily.      Alogliptin Benzoate 25 MG TABS Take 25 mg by mouth daily.     amLODipine (NORVASC) 5 MG tablet Take 5 mg by mouth daily.     aspirin EC 81 MG tablet Take 1 tablet (81 mg total) by mouth daily. Swallow whole.     busPIRone (BUSPAR) 10 MG tablet Take 5 mg by mouth 2 (two) times daily.      Capsaicin 0.05 % CREA Apply 1 application  topically at bedtime as needed (neuropathy in feet).     carbidopa-levodopa (SINEMET IR) 25-100 MG tablet TAKE 1 TABLET BY MOUTH THREE TIMES A DAY 270 tablet 1   carboxymethylcellulose (REFRESH PLUS) 0.5 % SOLN Place 1 drop into both eyes 4 (four) times daily.     Carboxymethylcellulose Sod PF 0.5 % SOLN Apply to eye.     colchicine 0.6 MG tablet Take 1.2 mg by mouth daily as needed (for gout).     divalproex (DEPAKOTE ER) 500 MG 24 hr tablet Take 500 mg by mouth at bedtime.     DULoxetine (CYMBALTA) 30 MG capsule Take 30 mg by mouth daily.     empagliflozin (JARDIANCE) 25 MG TABS tablet Take 25 mg by mouth daily.     esomeprazole (NEXIUM) 40 MG capsule Take 40 mg by mouth 2 (two) times daily.     famotidine (PEPCID) 40 MG tablet Take 40 mg by mouth daily as needed (gerd).     fluticasone (FLONASE) 50 MCG/ACT nasal spray Place 1 spray into both nostrils daily as needed for allergies or rhinitis.     furosemide (LASIX) 40 MG tablet Take 40 mg by mouth daily.     gabapentin (NEURONTIN) 600 MG tablet Take 600 mg by mouth 3 (three) times daily.     Homeopathic Products (THERAWORX RELIEF EX) Apply 1 Application topically daily as needed (leg cramps).     HYDROcodone-acetaminophen (NORCO/VICODIN) 5-325 MG tablet Take 1 tablet by mouth every 4 (four) hours as needed for severe pain ((score 4 to 6)).  30 tablet 0   insulin glargine-yfgn (SEMGLEE) 100 UNIT/ML Pen Inject 42 Units into the skin at bedtime.     Investigational - Study Medication Inject 1 Dose as directed every 6 (six) months. Study name: Lepodisiran on the Reduction of MACE w/ Elevated Lp(a) in Established CVD or High Risk for CVD (J3L-MC-[id number]-ACCLAIM-Lp(a))  Additional study details: [employer] [phone]     isosorbide mononitrate (IMDUR) 60 MG 24 hr tablet TAKE 1 TABLET BY MOUTH EVERY DAY 90 tablet 3   ketoconazole (NIZORAL) 2 % cream Apply 1 application topically daily as needed  for irritation (yeast irritation).      lisinopril (ZESTRIL) 40 MG tablet Take 40 mg by mouth daily.     methocarbamol (ROBAXIN) 500 MG tablet Take 1 tablet (500 mg total) by mouth every 6 (six) hours as needed for muscle spasms. 30 tablet 0   metoprolol succinate (TOPROL-XL) 100 MG 24 hr tablet Take 1 tablet (100 mg total) by mouth daily. Take with or immediately following a meal. 90 tablet 2   metroNIDAZOLE (METROGEL) 0.75 % gel Apply topically.     Mineral Oil 50 % EMUL Apply to eye.     mirabegron ER (MYRBETRIQ) 50 MG TB24 tablet Take 50 mg by mouth daily.     nitroGLYCERIN (NITROSTAT) 0.4 MG SL tablet Place 0.4 mg under the tongue every 5 (five) minutes as needed for chest pain.     oxybutynin (DITROPAN-XL) 10 MG 24 hr tablet Take 10 mg by mouth daily.     pantoprazole (PROTONIX) 40 MG tablet Take protonix 40 mg twice daily for 2 months.  Resume once per day after that. (Patient taking differently: Take 40 mg by mouth daily.) 60 tablet 0   polyethylene glycol (MIRALAX / GLYCOLAX) 17 g packet Take 17 g by mouth daily as needed for mild constipation or moderate constipation.     pyridOXINE (VITAMIN B-6) 100 MG tablet Take 100 mg by mouth daily.     rosuvastatin (CRESTOR) 40 MG tablet TAKE 1 TABLET BY MOUTH EVERY DAY 90 tablet 3   Sodium Fluoride 1.1 % PSTE Place 1 Application onto teeth 2 (two) times daily.     solifenacin (VESICARE) 5 MG tablet Take 5 mg by mouth.     SYNTHROID 25 MCG tablet Take 25 mcg by mouth daily before breakfast.     tamsulosin (FLOMAX) 0.4 MG CAPS capsule Take 0.4 mg by mouth every evening.      traZODone (DESYREL) 50 MG tablet Take 50 mg by mouth at bedtime.     White Petrolatum-Mineral Oil (SOOTHE NIGHTTIME) OINT Apply 1 Application to eye at bedtime.     Multiple Vitamin (MULTIVITAMIN) capsule Take 1 capsule by mouth daily. (Patient not taking: Reported on 12/26/2022)     No current facility-administered medications on file prior to visit.    Allergies  Allergen  Reactions   Lyrica [Pregabalin]     Delirium, Dizziness, Drowsy   Metformin Diarrhea     DIAGNOSTIC DATA (LABS, IMAGING, TESTING) - I reviewed patient records, labs, notes, testing and imaging myself where available.  Lab Results  Component Value Date   WBC 8.8 07/22/2022   HGB 14.9 07/22/2022   HCT 46.5 07/22/2022   MCV 90.5 07/22/2022   PLT 250 07/22/2022      Component Value Date/Time   NA 135 07/22/2022 1049   NA 139 08/20/2021 0851   K 4.4 07/22/2022 1049   CL 101 07/22/2022 1049   CO2 25 07/22/2022 1049  GLUCOSE 151 (H) 07/22/2022 1049   BUN 11 07/22/2022 1049   BUN 16 08/20/2021 0851   CREATININE 1.09 07/22/2022 1049   CALCIUM 9.3 07/22/2022 1049   PROT 7.6 11/07/2020 1527   ALBUMIN 4.5 11/07/2020 1527   AST 38 11/07/2020 1527   ALT 35 11/07/2020 1527   ALKPHOS 111 11/07/2020 1527   BILITOT 0.3 11/07/2020 1527   GFRNONAA >60 07/22/2022 1049   GFRAA >60 08/17/2019 1643   Lab Results  Component Value Date   CHOL 142 11/28/2021   HDL 34 (L) 11/28/2021   LDLCALC 74 11/28/2021   TRIG 205 (H) 11/28/2021   CHOLHDL 4.2 11/28/2021   Lab Results  Component Value Date   HGBA1C 6.5 (H) 07/22/2022   No results found for: "VITAMINB12" No results found for: "TSH"  PHYSICAL EXAM:  Today's Vitals   12/26/22 1315  BP: (!) 140/68  Pulse: (!) 55  Weight: 233 lb (105.7 kg)  Height: 5\' 10"  (1.778 m)   Body mass index is 33.43 kg/m.   Wt Readings from Last 3 Encounters:  12/26/22 233 lb (105.7 kg)  08/02/22 240 lb (108.9 kg)  07/29/22 236 lb (107 kg)     Ht Readings from Last 3 Encounters:  12/26/22 5\' 10"  (1.778 m)  08/02/22 5\' 10"  (1.778 m)  07/29/22 5\' 10"  (1.778 m)      General: The patient is awake, alert and appears not in acute distress. The patient is well groomed. Head: Normocephalic, atraumatic.  Neck is supple. Mallampati 3,  LARGE tongue  neck circumference: 21 inches . Nasal airflow not fully patent.  Retrognathia is  seen.  Dental  status: biological  Cardiovascular:  Regular rate and cardiac rhythm by pulse,  without distended neck veins. Respiratory: Lungs are clear to auscultation.  Skin:  Without evidence of ankle edema, or rash. Trunk: The patient's posture is erect.   NEUROLOGIC EXAM: The patient is awake and alert, oriented to place and time.   Memory subjective described as intact.  Attention span & concentration ability appears normal.  Speech is fluent,  without  dysarthria, dysphonia or aphasia.  Mood and affect are appropriate.   Cranial nerves: no loss of smell or taste reported  Pupils are equal and briskly reactive to light. Symmetric facial mimick  Extraocular movements in vertical and horizontal planes were intact and without nystagmus.  No Diplopia. Visual fields by finger perimetry are intact. Hearing was impaired  to soft voice and finger rubbing.    Facial sensation intact to fine touch.  Facial motor strength is symmetric and tongue and uvula move midline.  Neck ROM : rotation, tilt and flexion extension were normal for age and shoulder shrug was symmetrical.    Motor exam:  Symmetric bulk, tone and ROM.   Normal tone with bilateral biceps  cog wheeling, but symmetric grip strength .   Sensory:  Fine touch and vibration were normal.  Proprioception tested in the upper extremities was normal.   Coordination: Rapid alternating movements in the fingers/hands were of normal speed.  The Finger-to-nose maneuver was intact without evidence of ataxia, dysmetria but large amplitude hand and wrist tremor.   Gait and station: Patient could rise unassisted from a seated position, walked without assistive device. He can get up with bracing himself.  Toe and heel walk were deferred.  Deep tendon reflexes: in the  upper and lower extremities are symmetric and intact.  Babinski response was deferred.   ASSESSMENT AND PLAN 38 - year -old  male OSA patient is here upon request by NP Butch Penny, NP  with:    1)  severe hypersomnia with known OSA  on CPAP, needing better air leak control. Changing the mask  to a FFM under the nose. He had only one sleep study, in 2015 , here at [facility] sleep . None since-  he had a replacement of his CPAP 2019  2)[facility].  3) loss of peripheral vision from diabetic retinopathy, not driving.  He had a cervical spine surgery , which also limited ROM.   Plan:  we should have a new in lab sleep study , titrating this patient to new pressures, and under a new mask.   Follow up  through our NP within 6 months.  This will be following a new titration in lab study with mask refitting. Parasomnia watch.   I would like to thank Tisovec, Adelfa Koh, MD and Gaspar Garbe, Md [address] [place],  [place] [address] for allowing me to meet with and to take care of this pleasant patient.    After spending a total time of  39  minutes face to face and additional time for physical and neurologic examination, review of laboratory studies,  personal review of imaging studies, reports and results of other testing and review of referral information / records as far as provided in visit,   Electronically signed by: Melvyn Novas, MD 12/26/2022 1:51 PM  [facility] Board certified by The [employer] of Sleep Medicine and Diplomate of the [employer] of Sleep Medicine. Board certified In Neurology through the ABPN, Fellow of the [employer] of Neu[employer].

## 2022-12-26 NOTE — Patient Instructions (Signed)
77 - year -old male OSA patient is here upon request by NP Butch Penny, NP with:     1)  severe hypersomnia with known OSA  on CPAP, needing better air leak control. Changing the mask  to a FFM under the nose. He had only one sleep study, in 2015 , here at [facility] sleep . None since-  he had a replacement of his CPAP 2019   2)PTSD/ [facility].   3) loss of peripheral vision from diabetic retinopathy, not driving.  He had a cervical spine surgery , which also limited ROM.    Plan:  we should have a new in lab sleep study , titrating this patient to new pressures, and under a new mask.    Follow up  through our NP within 6 months.  This will be following a new titration in lab study with mask refitting. Parasomnia watch.

## 2022-12-27 ENCOUNTER — Other Ambulatory Visit (HOSPITAL_COMMUNITY): Payer: Self-pay

## 2022-12-30 ENCOUNTER — Telehealth: Payer: Self-pay | Admitting: Neurology

## 2022-12-30 NOTE — Telephone Encounter (Signed)
CPAP- HTA pending faxed notes.

## 2022-12-31 DIAGNOSIS — L821 Other seborrheic keratosis: Secondary | ICD-10-CM | POA: Diagnosis not present

## 2022-12-31 DIAGNOSIS — D225 Melanocytic nevi of trunk: Secondary | ICD-10-CM | POA: Diagnosis not present

## 2022-12-31 DIAGNOSIS — L814 Other melanin hyperpigmentation: Secondary | ICD-10-CM | POA: Diagnosis not present

## 2022-12-31 DIAGNOSIS — L853 Xerosis cutis: Secondary | ICD-10-CM | POA: Diagnosis not present

## 2022-12-31 DIAGNOSIS — L299 Pruritus, unspecified: Secondary | ICD-10-CM | POA: Diagnosis not present

## 2022-12-31 DIAGNOSIS — L570 Actinic keratosis: Secondary | ICD-10-CM | POA: Diagnosis not present

## 2023-01-04 ENCOUNTER — Other Ambulatory Visit: Payer: Self-pay | Admitting: Cardiology

## 2023-01-04 DIAGNOSIS — E782 Mixed hyperlipidemia: Secondary | ICD-10-CM

## 2023-01-04 DIAGNOSIS — I25118 Atherosclerotic heart disease of native coronary artery with other forms of angina pectoris: Secondary | ICD-10-CM

## 2023-01-07 NOTE — Telephone Encounter (Signed)
CPAP HTA Berkley Harvey: [id number] (exp. 12/30/22 to 03/13/23)

## 2023-01-08 NOTE — Telephone Encounter (Signed)
CPAP HTA Berkley Harvey: [id number] (exp. 12/30/22 to 03/13/23)   Patient is scheduled at [facility] for 02/04/23 at 8 pm.  Mailed packet to the patient.

## 2023-01-08 NOTE — Telephone Encounter (Signed)
LVM for pt to call back to schedule   HTA auth: [id number] (exp. 12/30/22 to 03/13/23)

## 2023-01-16 DIAGNOSIS — E1165 Type 2 diabetes mellitus with hyperglycemia: Secondary | ICD-10-CM | POA: Diagnosis not present

## 2023-01-17 ENCOUNTER — Other Ambulatory Visit: Payer: Self-pay | Admitting: Cardiology

## 2023-01-17 DIAGNOSIS — E782 Mixed hyperlipidemia: Secondary | ICD-10-CM

## 2023-01-17 DIAGNOSIS — I25118 Atherosclerotic heart disease of native coronary artery with other forms of angina pectoris: Secondary | ICD-10-CM

## 2023-01-28 DIAGNOSIS — E1140 Type 2 diabetes mellitus with diabetic neuropathy, unspecified: Secondary | ICD-10-CM | POA: Diagnosis not present

## 2023-01-28 DIAGNOSIS — Z794 Long term (current) use of insulin: Secondary | ICD-10-CM | POA: Diagnosis not present

## 2023-01-28 DIAGNOSIS — E7800 Pure hypercholesterolemia, unspecified: Secondary | ICD-10-CM | POA: Diagnosis not present

## 2023-01-28 DIAGNOSIS — I119 Hypertensive heart disease without heart failure: Secondary | ICD-10-CM | POA: Diagnosis not present

## 2023-01-29 ENCOUNTER — Ambulatory Visit: Payer: Self-pay | Admitting: Cardiology

## 2023-02-04 ENCOUNTER — Ambulatory Visit (INDEPENDENT_AMBULATORY_CARE_PROVIDER_SITE_OTHER): Payer: PPO | Admitting: Neurology

## 2023-02-04 DIAGNOSIS — G4733 Obstructive sleep apnea (adult) (pediatric): Secondary | ICD-10-CM | POA: Diagnosis not present

## 2023-02-04 DIAGNOSIS — G20A1 Parkinson's disease without dyskinesia, without mention of fluctuations: Secondary | ICD-10-CM

## 2023-02-04 DIAGNOSIS — F4310 Post-traumatic stress disorder, unspecified: Secondary | ICD-10-CM

## 2023-02-04 DIAGNOSIS — G4710 Hypersomnia, unspecified: Secondary | ICD-10-CM

## 2023-02-04 DIAGNOSIS — R0981 Nasal congestion: Secondary | ICD-10-CM

## 2023-02-07 ENCOUNTER — Encounter: Payer: Self-pay | Admitting: Cardiology

## 2023-02-07 ENCOUNTER — Ambulatory Visit: Payer: PPO | Attending: Cardiology | Admitting: Cardiology

## 2023-02-07 VITALS — BP 130/74 | HR 71 | Resp 16 | Ht 70.0 in | Wt 231.6 lb

## 2023-02-07 DIAGNOSIS — I350 Nonrheumatic aortic (valve) stenosis: Secondary | ICD-10-CM

## 2023-02-07 DIAGNOSIS — I25118 Atherosclerotic heart disease of native coronary artery with other forms of angina pectoris: Secondary | ICD-10-CM

## 2023-02-07 NOTE — Patient Instructions (Signed)
 Medication Instructions:   Your physician recommends that you continue on your current medications as directed. Please refer to the Current Medication list given to you today.  *If you need a refill on your cardiac medications before your next appointment, please call your pharmacy*    Testing/Procedures:  Your physician has requested that you have an echocardiogram. Echocardiography is a painless test that uses sound waves to create images of your heart. It provides your doctor with information about the size and shape of your heart and how well your heart's chambers and valves are working. This procedure takes approximately one hour. There are no restrictions for this procedure. Please do NOT wear cologne, perfume, aftershave, or lotions (deodorant is allowed). Please arrive 15 minutes prior to your appointment time.  Please note: We ask at that you not bring children with you during ultrasound (echo/ vascular) testing. Due to room size and safety concerns, children are not allowed in the ultrasound rooms during exams. Our front office staff cannot provide observation of children in our lobby area while testing is being conducted. An adult accompanying a patient to their appointment will only be allowed in the ultrasound room at the discretion of the ultrasound technician under special circumstances. We apologize for any inconvenience.    Follow-Up: At [facility], you and your health needs are our priority.  As part of our continuing mission to provide you with exceptional heart care, we have created designated Provider Care Teams.  These Care Teams include your primary Cardiologist (physician) and Advanced Practice Providers (APPs -  Physician Assistants and Nurse Practitioners) who all work together to provide you with the care you need, when you need it.  We recommend signing up for the patient portal called "MyChart".  Sign up information is provided on this After Visit Summary.   MyChart is used to connect with patients for Virtual Visits (Telemedicine).  Patients are able to view lab/test results, encounter notes, upcoming appointments, etc.  Non-urgent messages can be sent to your provider as well.   To learn more about what you can do with MyChart, go to [email or web address].    Your next appointment:   1 year(s)  Provider:   Dr. Rosemary Holms

## 2023-02-07 NOTE — Progress Notes (Signed)
  Cardiology Office Note:  .   Date:  02/07/2023  ID:  Anthony [name], DOB September 10, 1945, MRN [id number] PCP: Gaspar Garbe, MD  [facility] Providers Cardiologist:  Truett Mainland, MD PCP: Gaspar Garbe, MD  Chief Complaint  Patient presents with   Coronary artery disease involving native coronary artery of   Essential hypertension   Follow-up    6 months      History of Present Illness: .    Anthony [name] is a 78 y.o. male with hypertension, hyperlipidemia, CAD, type 2 DM, OSA on CPAP, hypothyroidism, GERD   Patient is doing well, denies complains of chest pain or shortness of breath.  His physical activity is down during winter, but hopes to pick up as a rather warms up eventually.  He is currently enrolled in a lipoprotein a clinical trial for  Vitals:   02/07/23 1540  BP: 130/74  Pulse: 71  Resp: 16  SpO2: 97%     ROS:  Review of Systems  Cardiovascular:  Negative for chest pain, dyspnea on exertion, leg swelling, palpitations and syncope.     Studies Reviewed: Marland Kitchen         Independently interpreted 09/2022: Chol 141, TG 167, HDL 35, LDL 77 HbA1C 6.0% Hb 13.5 Cr 0.9  Physical Exam:   Physical Exam Vitals and nursing note reviewed.  Constitutional:      General: He is not in acute distress. Neck:     Vascular: No JVD.  Cardiovascular:     Rate and Rhythm: Normal rate and regular rhythm.     Heart sounds: Murmur heard.     Harsh midsystolic murmur is present with a grade of 2/6 at the upper right sternal border radiating to the neck.  Pulmonary:     Effort: Pulmonary effort is normal.     Breath sounds: Normal breath sounds. No wheezing or rales.      VISIT DIAGNOSES:   ICD-10-CM   1. Nonrheumatic aortic valve stenosis  I35.0 ECHOCARDIOGRAM COMPLETE    2. Coronary artery disease of native artery of native heart with stable angina pectoris (HCC)  I25.118        ASSESSMENT AND PLAN: .    Anthony [name] is a 78 y.o. male  with hypertension, hyperlipidemia, mod nonobstructive CAD, type 2 DM, OSA on CPAP, hypothyroidism, GERD   CAD: S/p mid LAD PTCA (08/2021). No angina symptoms at this time. Continue aspirin 81 mg daily, Crestor 40 mg daily.     Hypertension: Well controlled.    Type 2 diabetes mellitus without complication: Continue current management, including Jardiance.   Mixed hyperlipidemia: LDL 77.  40 mg daily. He is enrolled in lipoprotein a clinical trial. Given LDL close to goal, have not made any changes.  Aortic stenosis: Mild on echocardiogram in 07/2021. Murmur has not changed significantly. Repeat echocardiogram in 1 year.   F/u in 1 year  Signed, Elder Negus, MD

## 2023-02-14 ENCOUNTER — Other Ambulatory Visit: Payer: Self-pay | Admitting: Cardiology

## 2023-02-14 DIAGNOSIS — E782 Mixed hyperlipidemia: Secondary | ICD-10-CM

## 2023-02-14 DIAGNOSIS — I25118 Atherosclerotic heart disease of native coronary artery with other forms of angina pectoris: Secondary | ICD-10-CM

## 2023-02-16 DIAGNOSIS — E1165 Type 2 diabetes mellitus with hyperglycemia: Secondary | ICD-10-CM | POA: Diagnosis not present

## 2023-02-21 ENCOUNTER — Encounter: Payer: Self-pay | Admitting: Neurology

## 2023-02-21 ENCOUNTER — Other Ambulatory Visit: Payer: Self-pay | Admitting: Neurology

## 2023-02-21 DIAGNOSIS — G4733 Obstructive sleep apnea (adult) (pediatric): Secondary | ICD-10-CM

## 2023-02-21 DIAGNOSIS — G4710 Hypersomnia, unspecified: Secondary | ICD-10-CM

## 2023-02-21 DIAGNOSIS — G20C Parkinsonism, unspecified: Secondary | ICD-10-CM

## 2023-02-21 DIAGNOSIS — F4310 Post-traumatic stress disorder, unspecified: Secondary | ICD-10-CM

## 2023-02-21 DIAGNOSIS — R9439 Abnormal result of other cardiovascular function study: Secondary | ICD-10-CM

## 2023-02-21 NOTE — Procedures (Signed)
[facility] PAP TITRATION INTERPRETATION REPORT   STUDY DATE: 02/04/2023      PATIENT NAME:  Anthony [name]         DATE OF BIRTH:  February 22, 1945  PATIENT ID:  [id number]    TYPE OF STUDY:  CPAP  READING PHYSICIAN: Melvyn Novas, MD SCORING TECHNICIAN: Domingo Cocking, RPSGT   HISTORY: This 78 year-old Male patient is established at [facility] and followed for Parkinson's disease, OSA and receives care and medications through the [place] as well ( Diabetes medications) . inspite of high CPAP compliance there has been persistent hypersomnia and high air leaks were seen, too.  The Epworth Sleepiness Scale was endorsed at 19 out of 24 points (scores above or equal to 10 are suggestive of hypersomnolence).  DESCRIPTION: A sleep technologist was in attendance for the duration of the recording.  Data collection, scoring, video monitoring, and reporting were performed in compliance with the AASM Manual for the Scoring of Sleep and Associated Events; (Hypopnea is scored based on the criteria listed in Section VIII D. 1b in the AASM Manual V2.6 CMS  using a 4% oxygen desaturation rule .A physician certified by the American Board of Sleep Medicine reviewed each epoch of the study.  ADDITIONAL INFORMATION:  Height: 70.0 in Weight: 233 lb (BMI 33) Neck Size: 21.0 in    MEDICATIONS: Tylenol, Ventolin HFA, Zyloprim, Alogliptin Benzoate, Norvasc, Aspirin, Buspar, Capsaicin, Sinemet IR, Refresh Plus eye drops, colchicine, Depakote ER, Cymbalta, Jardiance, Nexium, Pepcid, Flonase, Lasix, Neurontin, Norco, Semglee, Imdur, Nizoral, Zestril, Robaxin, Toprol XL, metro gel, Myrbetriq, Nitrostat, Ditropan XL, Protonix, Miralax, Vitamin B6, Crestor, Vesicare, Synthroid, Flomax, Desyrel   SLEEP CONTINUITY AND SLEEP ARCHITECTURE: this long term CPAP user was made familiar with an alternative to his current nasal pillow interface and fitted for a FFM by ResMed, F40  in medium size.   Lights off was  at 21:47: and lights on 05:00: (7.2 hours in bed). Total sleep time was 318.5 minutes (100.0% supine, 5.2% REM sleep), with a decreased sleep efficiency at 73.5%. Sleep latency was normal at 12.0 minutes.  Of the total sleep time, the percentage of stage N1 sleep was 14.3%, stage N2 sleep was 80.5%, stage N3 sleep was 0.0%, and REM sleep was 5.2%. There were 1 Stage R periods observed on this study night, 22 awakenings (i.e. transitions to Stage W from any sleep stage), and 62.0 total stage transitions.   Wake after sleep onset (WASO) time accounted for 102 minutes.  AROUSAL: There were 69 arousals in total.  Of these, 34 were identified as respiratory-related arousals (6.4 /h), 0 were PLM-related arousals (0.0 /h), and 35 were non-specific arousals (6.6 /h)  RESPIRATORY MONITORING:   Based on CMS criteria (using a 4% oxygen desaturation rule for scoring hypopneas), there were 48 apneas (39 obstructive; 4 central; 5 mixed), and 54 hypopneas. The Apnea index was 9.0/h. The Hypopnea index was 10.2/h. The AHI (apnea-hypopnea index) was 19.2/h overall (19.2 supine; 7.3 REM-all supine REM). There were 0 respiratory effort-related arousals (RERAs).    Based on AASM criteria (using a 3% oxygen desaturation and /or arousal rule for scoring hypopneas), there were 48 apneas (39 obstructive; 4 central; 5 mixed), and 101 hypopneas. Apnea index was 9.0. Hypopnea index was 19.0. The apnea-hypopnea index was 28.1 overall (28.1 supine, 0.0 non-supine; 7.3 REM, 7.3 supine REM).  OXIMETRY: Total sleep time spent at, or below 88% was 6.0 minutes, or 1.9% of total sleep time. Respiratory events were associated with  oxyhemoglobin desaturations (nadir during sleep 52%) from a mean of 92%). There were 0 occurrences of Cheyne Stokes breathing. BODY POSITION: Duration of total sleep and percent of total sleep in their respective position is as follows: supine 318 minutes (100.0%), non-supine 0.0 minutes (0.0%); right 00 minutes  (0.0%), left 00 minutes (0.0%), and prone 00 minutes (0.0%). Total supine REM sleep time was 16 minutes (100.0% of total REM sleep). LIMB MOVEMENTS: There were 0 periodic limb movements of sleep (0.0/h), of which 0 (0.0/h) were associated with an arousal. ECG:  The average heart rate during sleep was 57 bpm.  The maximum heart rate during sleep was 78 bpm. The minimum heart rate during sleep recording was 51 bpm.        IMPRESSION:  CPAP was initiated at 5 cm water pressure and advanced step by step to a final pressure of 19 cm water with 2 cm EPR.  the AHI only responded at 17, 18 and 19 cm water pressure and remained in the double digits at any pressures below.  The AHI was 0.0/h during 40 minutes of treatment under 18cm water pressure, 2 cm EPR and reached 100% sleep efficiency. The air leak was much reduced using a FFM and all supine sleep was recorded. No central apneas emerged at these higher pressures. No PLms were captured .  the patient had significant difficulties sleeping through the night from the start pressure of 5 cm onwards but improved at about 1 AM with the applied pressure of 14 cm water and thereafter.     RECOMMENDATIONS: F 40 medium ResMed FFM to reduce air leak. Set up with a ramp to 14- 19 cm water , 2 cm EPR , heated  humidifier use. Residual AHI of 10/h and under would still be considered optimal.   Evaluate for Epworth Sleepiness Score and Trend in FSS after 90-120 days of use ( not a new machine and no need to satisfy insurance driven compliance documentation).    Melvyn Novas, MD [facility] Board certified by The [employer] of Sleep Medicine and Diplomate of the [employer] of Sleep Medicine. Board certified In Neurology through the ABPN, Fellow of the [employer] of Neurology.                  [facility] CPAP Summary    General Information  Name: Anthony [name], Anthony [name]  BMI: 33.43 Physician: Melvyn Novas, MD  ID: [id number] Height: 70.0 in Technician: Domingo Cocking, RPSGT  Sex: Male Weight: 233.0 lb Record: [id number]  Age: 79 [03/13/1945] Date: 02/04/2023     Medical & Medication History    Anthony [name] is a 78 y.o. male patient who is here for revisit 12/26/2022 for persistent hypersomnia while on CPAP. The patient has PD and diabetes, gets many of his medications from the [place]. He has struggled with high insulin costs and relies on [place] as well. His CPAP is working OK for him, highly compliant he had some higher residual AH I ist under 10/ h.  But his air leakage is very, very high on a ResMed P 10 pillow. That one has flimsy headgear. He reportedly sleeps 8.5 hours at night and additional naps throughout the day when he watches Tv, for example.  Tylenol, Ventolin HFA, Zyloprim, Alogliptin Benzoate, Norvasc, Aspirin, Buspar, Capsaicin, Sinemet IR, Refresh Plus eye drops, colchicine, Depakote ER, Cymbalta, Jardiance, Nexium, Pepcid, Flonase, Lasix, Neurontin, Norco, Semglee, Imdur, Nizoral, Zestril, Robaxin, Toprol XL, Microgel,  Myrbetriq, Nitrostat, Ditropan XL, Protonix, Miralax, Vitamin B6, Crestor, Vesicare, Synthroid, Flomax, Desyrel   Sleep Disorder      Comments   Patient arrived for a CPAP titration polysomnogram. Procedure explained and all questions answered. Patient is in good compliance having some leak issues and a residual AHI around 10 while using a nasal pillows/cushion mask. Patient was shown CPAP at 5cm with a medium AirFit F40 full face mask. Patient said he likes the F40 FFM and agrees to use for tonight's titration. CPAP was started at 5cm using the medium AirFit F40 full face mask. Patient slept supine. Mild snoring was heard. Respiratory events observed. CPAP was increased to 19cm in an effort to control respiratory events and abolish snoring. EPR at 2 was used part of study for patient comfort. No obvious cardiac arrhythmias  observed. Patient has known cardiac issues. No significant PLMS observed. One restroom visit.      CPAP start time: 09:47:14 PM CPAP end time: 05:00:31 AM   Time Total Supine Side Prone Upright  Recording (TRT) 7h 13.12m 7h 13.79m 0h 0.26m 0h 0.74m 0h 0.11m  Sleep (TST) 5h 18.33m 5h 18.29m 0h 0.72m 0h 0.12m 0h 0.13m   Latency N1 N2 N3 REM Onset Per. Slp. Eff.  Actual 0h 11.34m 0h 36.54m 0h 0.37m 4h 17.65m 0h 11.84m 0h 42.24m 73.56%   Stg Dur Wake N1 N2 N3 REM  Total 113.0 45.5 256.5 0.0 16.5  Supine 113.0 45.5 256.5 0.0 16.5  Side 0.0 0.0 0.0 0.0 0.0  Prone 0.0 0.0 0.0 0.0 0.0  Upright 0.0 0.0 0.0 0.0 0.0   Stg % Wake N1 N2 N3 REM  Total 26.2 14.3 80.5 0.0 5.2  Supine 26.2 14.3 80.5 0.0 5.2  Side 0.0 0.0 0.0 0.0 0.0  Prone 0.0 0.0 0.0 0.0 0.0  Upright 0.0 0.0 0.0 0.0 0.0     Apnea Summary Sub Supine Side Prone Upright  Total 48 Total 48 48 0 0 0    REM 0 0 0 0 0    NREM 48 48 0 0 0  Obs 39 REM 0 0 0 0 0    NREM 39 39 0 0 0  Mix 5 REM 0 0 0 0 0    NREM 5 5 0 0 0  Cen 4 REM 0 0 0 0 0    NREM 4 4 0 0 0   Rera Summary Sub Supine Side Prone Upright  Total 0 Total 0 0 0 0 0    REM 0 0 0 0 0    NREM 0 0 0 0 0   Hypopnea Summary Sub Supine Side Prone Upright  Total 101 Total 101 101 0 0 0    REM 2 2 0 0 0    NREM 99 99 0 0 0   4% Hypopnea Summary Sub Supine Side Prone Upright  Total (4%) 54 Total 54 54 0 0 0    REM 2 2 0 0 0    NREM 52 52 0 0 0     AHI Total Obs Mix Cen  28.07 Apnea 9.04 7.35 0.94 0.75   Hypopnea 19.03 -- -- --  19.22 Hypopnea and Apnea  (4%) 10.17 -- -- --    Total Supine Side Prone Upright  Position AHI 28.07 28.07 0.00 0.00 0.00  REM AHI 7.27   NREM AHI 29.21   Position RDI 28.07 28.07 0.00 0.00 0.00  REM RDI 7.27   NREM RDI 29.21    4%  Hypopnea Total Supine Side Prone Upright  Position AHI (4%) 19.22 19.22 0.00 0.00 0.00  REM AHI (4%) 7.27   NREM AHI (4%) 19.87   Position RDI (4%) 19.22 19.22 0.00 0.00 0.00  REM RDI (4%) 7.27   NREM RDI (4%) 19.87     Desaturation Information  <100% <90% <80% <70% <60% <50% <40%  Supine 93 59 0 0 0 0 0  Side 0 0 0 0 0 0 0  Prone 0 0 0 0 0 0 0  Upright 0 0 0 0 0 0 0  Total 93 59 0 0 0 0 0  Desaturation threshold setting: 4% Minimum desaturation setting: 10 seconds SaO2 nadir: 52% The longest event was a 42 sec obstructive Hypopnea with a minimum SaO2 of 90%. The lowest SaO2 was 81% associated with a 15 sec obstructive Apnea. EKG Rates EKG Avg Max Min  Awake 59 83 53  Asleep 57 78 51  EKG Events: N/A Awakening/Arousal Information # of Awakenings 22  Wake after sleep onset 102.26m  Wake after persistent sleep 89.68m   Arousal Assoc. Arousals Index  Apneas 12 2.3  Hypopneas 22 4.1  Leg Movements 0 0.0  Snore 0.0 0.0  PTT Arousals 0 0.0  Spontaneous 35 6.6  Total 69 13.0  Myoclonus Information PLMS LMs Index  Total LMs during PLMS 0 0.0  LMs w/ Microarousals 0 0.0   LM LMs Index  w/ Microarousal 0 0.0  w/ Awakening 0 0.0  w/ Resp Event 0 0.0  Spontaneous 1 0.2  Total 1 0.2     [facility]    General Information  Name: Anthony [name], Anthony [name] BMI: 33 Physician: ,   ID: [id number] Height: 70 in Technician: Domingo Cocking  Sex: Male Weight: 233 lb Record: [id number]  Age: 6 [16-May-1945] Date: 02/04/2023 Scorer: Domingo Cocking                               Pressure Settings Phase THERAPY THERAPY THERAPY THERAPY THERAPY THERAPY THERAPY THERAPY   Protocol - - - - - - - -   Max Pressure - - - - - - - -   IPAP 05 07 09 10 12 14 16 17    EPAP 05 07 09 10 12 14 16 17    PS - - - - - - - -   Device - - - - - - - -   Mask - - - - - - - -   Backup Rate - - - - - - - -   Humidity - - - - - - - -  Time TRT 32.98m 33.18m 15.36m 74.3m 22.37m 38.72m 47.49m 92.33m   TST 11.27m 30.72m 15.20m 9.34m 10.62m 33.75m 47.71m 86.24m   Event Epoch 10 75 142 172 320 364 440 535  Sleep Stage % Wake 47.6 10.4 0.0 87.8 54.5 13.2 0.0 6.5   % REM 0.0 0.0 0.0 0.0 0.0 0.0 0.0 19.2   %  N1 100.0 70.0 0.0 16.7 55.0 4.5 0.0 5.8   % N2 0.0 30.0 100.0 83.3 45.0 95.5 100.0 75.0   % N3 0.0 0.0 0.0 0.0 0.0 0.0 0.0 0.0  Respiratory Total Events 3 38 20 5 13 20 25 13    Obs. Apn. 2 9 3 2 5 6 11  0   Mixed Apn. 0 0 0 0 0 2 1 2    Cen. Apn. 0 1 1 1  0 1 0 0   Obs. Hyp. 1 28 16 2 8 11 13 11    Cen. Hyp. 0 0 0 0 0 0 0 0   AHI 16.36 76.00 80.00 33.33 78.00 36.36 31.58 9.07   Supine AHI 16.36 76.00 80.00 33.33 78.00 36.36 31.58 9.07   Prone AHI 0.00 0.00 0.00 0.00 0.00 0.00 0.00 0.00   Side AHI 0.00 0.00 0.00 0.00 0.00 0.00 0.00 0.00  Respiratory (4%) Obs. Hyp. (4%) 1.00 12.00 8.00 1.00 6.00 4.00 11.00 6.00   Cen. Hyp. (4%) 0.00 0.00 0.00 0.00 0.00 0.00 0.00 0.00   AHI (4%) 16.36 44.00 48.00 26.67 66.00 23.64 29.05 5.58   Supine AHI (4%) 16.36 44.00 48.00 26.67 66.00 23.64 29.05 5.58   Prone AHI (4%) 0.00 0.00 0.00 0.00 0.00 0.00 0.00 0.00   Side AHI (4%) 0.00 0.00 0.00 0.00 0.00 0.00 0.00 0.00  Desat Profile <= 90% 0.78m 2.71m 2.1m 10.55m 11.61m 17.66m 5.82m 9.59m   <= 80% 0.72m 0.41m 0.44m 3.59m 0.84m 0.65m 0.98m 0.66m   <= 70% 0.59m 0.5m 0.57m 3.51m 0.2m 0.71m 0.22m 0.26m   <= 60% 0.56m 0.79m 0.61m 3.46m 0.57m 0.14m 0.54m 0.49m  Arousal Index Apnea 0.0 2.0 4.0 6.7 0.0 1.8 8.8 0.0   Hypopnea 0.0 4.0 12.0 0.0 12.0 1.8 7.6 2.1   LM 0.0 0.0 0.0 0.0 0.0 0.0 0.0 0.0   Spontaneous 0.0 8.0 20.0 20.0 0.0 10.9 8.8 3.5   Pressure Settings Phase THERAPY THERAPY   Protocol - -   Max Pressure - -   IPAP 18 19   EPAP 18 19   PS - -   Device - -   Mask - -   Backup Rate - -   Humidity - -  Time TRT 40.65m 38.63m   TST 40.74m 36.71m   Event Epoch 719 800  Sleep Stage % Wake 0.0 0.0   % REM 0.0 0.0   % N1 0.0 0.0   % N2 100.0 100.0   % N3 0.0 0.0  Respiratory Total Events 6 6   Obs. Apn. 0 1   Mixed Apn. 0 0   Cen. Apn. 0 0   Obs. Hyp. 6 5   Cen. Hyp. 0 0   AHI 8.89 9.86   Supine AHI 8.89 9.86   Prone AHI 0.00 0.00   Side AHI 0.00 0.00  Respiratory (4%) Obs. Hyp. (4%) 0.00 5.00   Cen. Hyp. (4%) 0.00 0.00    AHI (4%) 0.00 9.86   Supine AHI (4%) 0.00 9.86   Prone AHI (4%) 0.00 0.00   Side AHI (4%) 0.00 0.00  Desat Profile <= 90% 0.78m 15.43m   <= 80% 0.47m 0.80m   <= 70% 0.57m 0.33m   <= 60% 0.58m 0.13m  Arousal Index Apnea 0.0 1.6   Hypopnea 3.0 4.9   LM 0.0 0.0   Spontaneous 5.9 1.6

## 2023-02-25 ENCOUNTER — Telehealth: Payer: Self-pay | Admitting: *Deleted

## 2023-02-25 NOTE — Telephone Encounter (Signed)
 Spoke with patient gave  sleep study results Pt DME  is [facility] Pt states already has cpap machine .Only had machine for 3 years . Pt states will keep pap machine and  order new supplies informed pt of insurance compliance Pt has f/u appointment with Megan,NP 07/2023 Pt expressed understanding and thanked me for calling Faxed over orders to [employer] this afternoon

## 2023-02-25 NOTE — Telephone Encounter (Signed)
-----   Message from Andrews Dohmeier sent at 02/21/2023  2:27 PM EST ----- [facility] : Persistent hypersomnia while on CPAP with high compliance. (Has Aortic valve disease, PD , PTSD , CAD, DM )  used until now 8-16 cm water  pressure with residual AHI between 7 and 10/h. This time during PSG re-titrated to AHI of 0/h under 18 cm water  .   New setting and new mask  recommended:    AIRFIT F 40 medium ResMed FFM to reduce air leak.  Set up with a ramp to 14- 19 cm water  , 2 cm EPR , heated humidifier use.   PS : Residual AHI of 10/h and under would still be considered optimal. Evaluate for Epworth Sleepiness Score and Trend in FSS after 90-120 days of use ( not a new machine and no need to satisfy insurance driven compliance documentation). Patient's machine was not provided by the [place] .    PS : Acting out dreams was reported last year, identified as PTSD per [place] records, and not as REM BD - which is likely to occur in a PD patient.  Screaming, kicking, fighting. Has once fallen out of bed. The medication has reduced this significantly.

## 2023-02-26 ENCOUNTER — Ambulatory Visit (HOSPITAL_COMMUNITY): Payer: PPO | Attending: Cardiology

## 2023-02-26 ENCOUNTER — Encounter: Payer: Self-pay | Admitting: Cardiology

## 2023-02-26 DIAGNOSIS — I350 Nonrheumatic aortic (valve) stenosis: Secondary | ICD-10-CM | POA: Insufficient documentation

## 2023-02-26 LAB — ECHOCARDIOGRAM COMPLETE
AR max vel: 1.94 cm2
AV Area VTI: 1.97 cm2
AV Area mean vel: 1.98 cm2
AV Mean grad: 8 mm[Hg]
AV Peak grad: 16.2 mm[Hg]
Ao pk vel: 2.02 m/s
Area-P 1/2: 3.65 cm2
S' Lateral: 3.1 cm

## 2023-03-05 DIAGNOSIS — G4733 Obstructive sleep apnea (adult) (pediatric): Secondary | ICD-10-CM | POA: Diagnosis not present

## 2023-03-07 ENCOUNTER — Other Ambulatory Visit: Payer: Self-pay | Admitting: Cardiology

## 2023-03-07 DIAGNOSIS — E782 Mixed hyperlipidemia: Secondary | ICD-10-CM

## 2023-03-07 DIAGNOSIS — I25118 Atherosclerotic heart disease of native coronary artery with other forms of angina pectoris: Secondary | ICD-10-CM

## 2023-03-07 MED ORDER — METOPROLOL SUCCINATE ER 100 MG PO TB24: 100.0000 mg | ORAL_TABLET | Freq: Every day | ORAL | 3 refills | Status: DC

## 2023-03-17 ENCOUNTER — Other Ambulatory Visit: Payer: Self-pay | Admitting: Adult Health

## 2023-03-19 DIAGNOSIS — E1165 Type 2 diabetes mellitus with hyperglycemia: Secondary | ICD-10-CM | POA: Diagnosis not present

## 2023-03-27 DIAGNOSIS — G4733 Obstructive sleep apnea (adult) (pediatric): Secondary | ICD-10-CM | POA: Diagnosis not present

## 2023-04-16 DIAGNOSIS — E1165 Type 2 diabetes mellitus with hyperglycemia: Secondary | ICD-10-CM | POA: Diagnosis not present

## 2023-04-18 ENCOUNTER — Other Ambulatory Visit: Payer: Self-pay | Admitting: Adult Health

## 2023-04-18 ENCOUNTER — Other Ambulatory Visit: Payer: Self-pay | Admitting: Cardiology

## 2023-04-18 DIAGNOSIS — I25118 Atherosclerotic heart disease of native coronary artery with other forms of angina pectoris: Secondary | ICD-10-CM

## 2023-05-01 DIAGNOSIS — E669 Obesity, unspecified: Secondary | ICD-10-CM | POA: Diagnosis not present

## 2023-05-01 DIAGNOSIS — D692 Other nonthrombocytopenic purpura: Secondary | ICD-10-CM | POA: Diagnosis not present

## 2023-05-01 DIAGNOSIS — Z794 Long term (current) use of insulin: Secondary | ICD-10-CM | POA: Diagnosis not present

## 2023-05-01 DIAGNOSIS — I10 Essential (primary) hypertension: Secondary | ICD-10-CM | POA: Diagnosis not present

## 2023-05-01 DIAGNOSIS — M109 Gout, unspecified: Secondary | ICD-10-CM | POA: Diagnosis not present

## 2023-05-01 DIAGNOSIS — I700 Atherosclerosis of aorta: Secondary | ICD-10-CM | POA: Diagnosis not present

## 2023-05-01 DIAGNOSIS — E7800 Pure hypercholesterolemia, unspecified: Secondary | ICD-10-CM | POA: Diagnosis not present

## 2023-05-01 DIAGNOSIS — E039 Hypothyroidism, unspecified: Secondary | ICD-10-CM | POA: Diagnosis not present

## 2023-05-01 DIAGNOSIS — F4310 Post-traumatic stress disorder, unspecified: Secondary | ICD-10-CM | POA: Diagnosis not present

## 2023-05-01 DIAGNOSIS — E1140 Type 2 diabetes mellitus with diabetic neuropathy, unspecified: Secondary | ICD-10-CM | POA: Diagnosis not present

## 2023-05-01 DIAGNOSIS — H5462 Unqualified visual loss, left eye, normal vision right eye: Secondary | ICD-10-CM | POA: Diagnosis not present

## 2023-05-01 DIAGNOSIS — G20C Parkinsonism, unspecified: Secondary | ICD-10-CM | POA: Diagnosis not present

## 2023-05-17 DIAGNOSIS — E1165 Type 2 diabetes mellitus with hyperglycemia: Secondary | ICD-10-CM | POA: Diagnosis not present

## 2023-05-20 DIAGNOSIS — M25512 Pain in left shoulder: Secondary | ICD-10-CM | POA: Diagnosis not present

## 2023-05-20 DIAGNOSIS — M545 Low back pain, unspecified: Secondary | ICD-10-CM | POA: Diagnosis not present

## 2023-05-20 DIAGNOSIS — R42 Dizziness and giddiness: Secondary | ICD-10-CM | POA: Diagnosis not present

## 2023-05-20 DIAGNOSIS — R251 Tremor, unspecified: Secondary | ICD-10-CM | POA: Diagnosis not present

## 2023-05-22 DIAGNOSIS — R2689 Other abnormalities of gait and mobility: Secondary | ICD-10-CM | POA: Diagnosis not present

## 2023-05-26 DIAGNOSIS — R2689 Other abnormalities of gait and mobility: Secondary | ICD-10-CM | POA: Diagnosis not present

## 2023-05-28 DIAGNOSIS — R2689 Other abnormalities of gait and mobility: Secondary | ICD-10-CM | POA: Diagnosis not present

## 2023-06-02 DIAGNOSIS — R2689 Other abnormalities of gait and mobility: Secondary | ICD-10-CM | POA: Diagnosis not present

## 2023-06-04 DIAGNOSIS — R2689 Other abnormalities of gait and mobility: Secondary | ICD-10-CM | POA: Diagnosis not present

## 2023-06-10 ENCOUNTER — Ambulatory Visit: Payer: PPO | Attending: Adult Health | Admitting: Physical Therapy

## 2023-06-10 DIAGNOSIS — R2689 Other abnormalities of gait and mobility: Secondary | ICD-10-CM | POA: Insufficient documentation

## 2023-06-10 NOTE — Therapy (Signed)
 [place] [facility] [address], [address] [place], [place], [address] Phone: [phone]   Fax:  [phone]  Patient Details  Name: Anthony [name] MRN: [id number] Date of Birth: [birth year] Referring Provider:  Suzzanne Estrin, MD  Encounter Date: 06/10/2023  Physical Therapy Parkinson's Disease Screen   Timed Up and Go test:18.47 sec  10 meter walk test: Gait velocity over 22 ft:  10.53 sec =  2.08 ft/sec (compared to 3.2 ft/sec)  5 time sit to stand test:Attempted and pt performs 2-reports things are going black, needs to sit still. (compared to 17 sec)  Patient would benefit from Physical Therapy evaluation due to slowed mobility measures and falls since last bout of PT at this center, ending 11/2022.  **Please note, pt is currently being seen at [facility] through [facility].  I am not sure to what extent they are addressing balance and falls.    Wife is present; she reports he has had 3 falls just this past weekend, more falls in the past 6 months.  These falls are dizziness spells and just fall.  *Assessed BP measures in sitting 137/87, HR 65; standing 119/75, HR 66  Rateel Beldin W., PT 06/10/2023, 12:17 PM  [facility] [address], [address] [place], [place], [address] Phone: [phone]   Fax:  [phone]

## 2023-06-11 ENCOUNTER — Other Ambulatory Visit: Payer: Self-pay | Admitting: Neurology

## 2023-06-11 DIAGNOSIS — G20A1 Parkinson's disease without dyskinesia, without mention of fluctuations: Secondary | ICD-10-CM

## 2023-06-11 DIAGNOSIS — E113391 Type 2 diabetes mellitus with moderate nonproliferative diabetic retinopathy without macular edema, right eye: Secondary | ICD-10-CM

## 2023-06-11 DIAGNOSIS — G4733 Obstructive sleep apnea (adult) (pediatric): Secondary | ICD-10-CM

## 2023-06-11 DIAGNOSIS — R2689 Other abnormalities of gait and mobility: Secondary | ICD-10-CM | POA: Diagnosis not present

## 2023-06-11 NOTE — Progress Notes (Signed)
 Order PT at [facility] for PD.

## 2023-06-12 ENCOUNTER — Telehealth: Payer: Self-pay | Admitting: Neurology

## 2023-06-12 NOTE — Telephone Encounter (Signed)
 Referral for physical  therapy sent through [facility] to [facility]. Phone: [phone]

## 2023-06-13 ENCOUNTER — Other Ambulatory Visit: Payer: Self-pay | Admitting: Cardiology

## 2023-06-16 DIAGNOSIS — E1165 Type 2 diabetes mellitus with hyperglycemia: Secondary | ICD-10-CM | POA: Diagnosis not present

## 2023-06-17 ENCOUNTER — Ambulatory Visit: Admitting: Adult Health

## 2023-06-17 ENCOUNTER — Encounter: Payer: Self-pay | Admitting: Adult Health

## 2023-06-17 VITALS — Ht 70.0 in | Wt 244.0 lb

## 2023-06-17 DIAGNOSIS — R1310 Dysphagia, unspecified: Secondary | ICD-10-CM

## 2023-06-17 DIAGNOSIS — R269 Unspecified abnormalities of gait and mobility: Secondary | ICD-10-CM

## 2023-06-17 DIAGNOSIS — R42 Dizziness and giddiness: Secondary | ICD-10-CM | POA: Diagnosis not present

## 2023-06-17 DIAGNOSIS — G20A1 Parkinson's disease without dyskinesia, without mention of fluctuations: Secondary | ICD-10-CM | POA: Diagnosis not present

## 2023-06-17 DIAGNOSIS — R55 Syncope and collapse: Secondary | ICD-10-CM | POA: Diagnosis not present

## 2023-06-17 NOTE — Progress Notes (Unsigned)
 PATIENT: Anthony [name] DOB: [birth year]  REASON FOR VISIT: follow up HISTORY FROM: patient PRIMARY NEUROLOGIST: Anthony [name]  Chief Complaint  Patient presents with   Follow-up    Pt in 9 with wife Pt here for increased falls Pt states lightheaded Pt and wife  states balance is off  Pt states increased headaches wife wants to discuss Neuro rehab for patient      HISTORY OF PRESENT ILLNESS: Today 06/17/23:  Anthony [name] is a 78 y.o. male with a history of Parkinson's disease. Returns today for follow-up.  Patient was advised by physical therapy to make an appointment with our office.  When he was at physical therapy he had a 20 point blood pressure dropped from sitting to standing.  Patient reports that he has been having episodes where he gets dizzy which she describes as feeling lightheaded and then will often blackout.  He reports that this does not immediately happen with a position change but after he has been up for several minutes.  Reports that he was walking to the mailbox and he turned around nest when it felt like he was spinning.  He tried to hold on to a car but fell to the ground.  Fortunately he has not had any injuries from the falls.  He does feel that his balance is off.  He often feels unsteady when ambulating.  Feels that his tremor is worse in the left arm.  He does note that sometimes food and liquids get stuck when he is trying to swallow.  He denies being put on any additional medication that would correlate to his increase in feeling lightheaded and falling.  Patient does admit that he is on several medications and not sure what they were prescribed for.  He returns today for an evaluation.    6/5/24[name] is a 78 y.o. male with a history of obstructive sleep apnea on CPAP and Parkinson disease. Returns today for follow-up.  He reports that the CPAP is working well although he just changed out his supplies and he feels that the hose and water chamber is  not working correctly.  PD: tremor comes and goes. Primarily in the LUE. Just recently did PT. Reports in the mornings he feels off balance. Feels this is related to neuropathy. No falls. No trouble swallowing. Reports some acting out dreams- relates to PTSD. [facility] and now only once a week.   '    Anthony [name] is a 78 year old male with a history of obstructive sleep apnea on CPAP and tremor.  Reports that CPAP is working well.  Denies any new issues.  Tremor: Reports tremor has improved since being on Sinemet .  Currently taking 25-100 mg 3 times a day.  Denies any significant changes in his gait or balance.  States that he is not walking as much as he was since his dog passed away.  He does have some trouble swallowing food.  He reports that he did have a swallow test with the [place].  Unsure of the results.  It seems that they also recommended endoscopy this has not been completed yet.  He reports that he is also had his esophagus stretched in the past.    HISTORY (Copied from Anthony [name]'s note) Nurse practitioner Anthony [name] mentioned that the patient has noted a slowly progressive tremor that affected his left dominant hand greater than his right this has been present since earlier this year maybe even a little longer  but it has progressed since.   The patient also has a history of neuropathy, he does not use socks and has had swelling and redness of the skin in the lower extremities sometimes numbness in the left upper extremity as well.  He has remained compliant with his CPAP use for which she has followed up with our nurse practitioner.  However as of early October this year his tremor has become so high amplitude that the shaking of his left side has left him unable to perform certain maneuvers, affects his fine motor skills.  He also seems to run into things and bumps into objects when he walks.  He drops objects to from his dominant hand.  The patient has a history of  diabetes mellitus type 2 diagnosed in 2007, hyperglycemia, basal insulin  was started 2019, he has a history of hypertension angina pectoris and has followed with Dr. Filiberto [name] his cardiologist.  Hyperlipidemia hypothyroidism noted in 2008 history of gout and benign prostate hyperplasia, pancreatitis bout in March 2016.  COVID in July 2022. Eye vision loss, 2020, right eye  DM related or stroke. At [facility]     REVIEW OF SYSTEMS: Out of a complete 14 system review of symptoms, the patient complains only of the following symptoms, and all other reviewed systems are negative.  ALLERGIES: Allergies  Allergen Reactions   Lyrica [Pregabalin]     Delirium, Dizziness, Drowsy   Metformin  Diarrhea    HOME MEDICATIONS: Outpatient Medications Prior to Visit  Medication Sig Dispense Refill   acetaminophen  (TYLENOL ) 500 MG tablet Take 500 mg by mouth every 6 (six) hours as needed for moderate pain.     albuterol  (VENTOLIN  HFA) 108 (90 Base) MCG/ACT inhaler Inhale 1 puff into the lungs every 4 (four) hours as needed for wheezing or shortness of breath.     allopurinol  (ZYLOPRIM ) 300 MG tablet Take 300 mg by mouth daily.      Alogliptin  Benzoate 25 MG TABS Take 25 mg by mouth daily.     amLODipine  (NORVASC ) 5 MG tablet Take 5 mg by mouth daily.     aspirin  EC 81 MG tablet Take 1 tablet (81 mg total) by mouth daily. Swallow whole.     busPIRone  (BUSPAR ) 10 MG tablet Take 5 mg by mouth 2 (two) times daily.     Capsaicin 0.05 % CREA Apply 1 application  topically at bedtime as needed (neuropathy in feet).     carbidopa -levodopa  (SINEMET  IR) 25-100 MG tablet TAKE 1 TABLET BY MOUTH THREE TIMES A DAY 270 tablet 1   carboxymethylcellulose (REFRESH PLUS) 0.5 % SOLN Place 1 drop into both eyes 4 (four) times daily.     Carboxymethylcellulose Sod PF 0.5 % SOLN Apply to eye.     colchicine 0.6 MG tablet Take 1.2 mg by mouth daily as needed (for gout).     divalproex  (DEPAKOTE  ER) 500 MG 24 hr tablet Take 500 mg by  mouth at bedtime.     DULoxetine  (CYMBALTA ) 30 MG capsule Take 30 mg by mouth daily.     empagliflozin  (JARDIANCE ) 25 MG TABS tablet Take 25 mg by mouth daily.     esomeprazole (NEXIUM) 40 MG capsule Take 40 mg by mouth 2 (two) times daily.     famotidine (PEPCID) 40 MG tablet Take 40 mg by mouth daily as needed (gerd).     fluticasone  (FLONASE ) 50 MCG/ACT nasal spray Place 1 spray into both nostrils daily as needed for allergies or rhinitis.  furosemide  (LASIX ) 40 MG tablet Take 40 mg by mouth daily.     gabapentin  (NEURONTIN ) 600 MG tablet Take 600 mg by mouth 3 (three) times daily.     Homeopathic Products (THERAWORX RELIEF EX) Apply 1 Application topically daily as needed (leg cramps).     HYDROcodone -acetaminophen  (NORCO/VICODIN) 5-325 MG tablet Take 1 tablet by mouth every 4 (four) hours as needed for severe pain ((score 4 to 6)). 30 tablet 0   insulin  glargine-yfgn (SEMGLEE) 100 UNIT/ML Pen Inject 42 Units into the skin at bedtime.     Investigational - Study Medication Inject 1 Dose as directed every 6 (six) months. Study name: Lepodisiran on the Reduction of MACE w/ Elevated Lp(a) in Established CVD or High Risk for CVD (J3L-MC-EZEF-ACCLAIM-Lp(a))  Additional study details: [employer] [phone]     isosorbide  mononitrate (IMDUR ) 60 MG 24 hr tablet TAKE 1 TABLET BY MOUTH EVERY DAY 90 tablet 3   lisinopril  (ZESTRIL ) 40 MG tablet Take 40 mg by mouth daily.     methocarbamol  (ROBAXIN ) 500 MG tablet Take 1 tablet (500 mg total) by mouth every 6 (six) hours as needed for muscle spasms. 30 tablet 0   metoprolol  succinate (TOPROL -XL) 100 MG 24 hr tablet Take 1 tablet (100 mg total) by mouth daily. Take with or immediately following a meal. 90 tablet 3   Mineral Oil 50 % EMUL Apply to eye.     mirabegron  ER (MYRBETRIQ ) 50 MG TB24 tablet Take 50 mg by mouth daily.     Multiple Vitamin (MULTIVITAMIN) capsule Take 1 capsule by mouth daily.     nitroGLYCERIN  (NITROSTAT ) 0.4 MG SL  tablet Place 0.4 mg under the tongue every 5 (five) minutes as needed for chest pain.     oxybutynin  (DITROPAN -XL) 10 MG 24 hr tablet Take 10 mg by mouth daily.     pantoprazole  (PROTONIX ) 40 MG tablet Take protonix  40 mg twice daily for 2 months.  Resume once per day after that. (Patient taking differently: Take 40 mg by mouth daily.) 60 tablet 0   pyridOXINE (VITAMIN B-6) 100 MG tablet Take 100 mg by mouth daily.     rosuvastatin  (CRESTOR ) 40 MG tablet TAKE 1 TABLET BY MOUTH EVERY DAY 90 tablet 3   Sodium Fluoride 1.1 % PSTE Place 1 Application onto teeth 2 (two) times daily.     solifenacin (VESICARE) 5 MG tablet Take 5 mg by mouth.     SYNTHROID  25 MCG tablet Take 25 mcg by mouth daily before breakfast.     tamsulosin  (FLOMAX ) 0.4 MG CAPS capsule Take 0.4 mg by mouth every evening.      traZODone  (DESYREL ) 50 MG tablet Take 50 mg by mouth at bedtime.     White Petrolatum-Mineral Oil (SOOTHE NIGHTTIME) OINT Apply 1 Application to eye at bedtime.     ketoconazole  (NIZORAL ) 2 % cream Apply 1 Application topically daily as needed for irritation (yeast irritation). (Patient not taking: Reported on 06/17/2023) 15 g 5   ketoconazole  (NIZORAL ) 2 % shampoo Apply 1 Application topically 2 (two) times a week. (Patient not taking: Reported on 06/17/2023) 120 mL 0   metroNIDAZOLE (METROGEL) 0.75 % gel Apply topically.     polyethylene glycol (MIRALAX / GLYCOLAX) 17 g packet Take 17 g by mouth daily as needed for mild constipation or moderate constipation.     rosuvastatin  (CRESTOR ) 20 MG tablet TAKE 1 TABLET BY MOUTH EVERY DAY 90 tablet 3   No facility-administered medications prior to visit.  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   Neuromuscular disorder (HCC) 2023   Parkinson's Disease   Neuropathy    left leg greater than right leg   Obesity (BMI 30.0-34.9) 05/14/2013   OSA on CPAP    cpap setting of 10/ 13   Pancreatitis dx march 2016   Pneumonia 12 years ago    PAST SURGICAL HISTORY: Past Surgical History:  Procedure Laterality Date   ANTERIOR CERVICAL DECOMP/DISCECTOMY FUSION N/A 08/02/2022   Procedure: Anterior Cervical Decompression/Discectomy Fusion - Cervical Three-Cervical Four,  Cervical Four-Cervical Five,  remove Cervical Five-Cervical Six Plate;  Surgeon: Joaquin Mulberry, MD;  Location: [facility] OR;  Service: Neurosurgery;  Laterality: N/A;  3C   BACK SURGERY  10/2009   Cervical, arterior   CARPAL TUNNEL RELEASE Left 2003   CARPAL TUNNEL RELEASE Bilateral    CATARACT EXTRACTION Bilateral 01/2012   COLONOSCOPY  2024   CORONARY BALLOON ANGIOPLASTY N/A 08/28/2021   Procedure: CORONARY BALLOON ANGIOPLASTY;  Surgeon: Cody Das, MD;  Location: [facility];  Service: Cardiovascular;  Laterality: N/A;   EUS N/A 07/15/2014   Procedure: FULL UPPER ENDOSCOPIC ULTRASOUND (EUS) RADIAL;  Surgeon: Alvis Jourdain, MD;  Location: [facility] ENDOSCOPY;  Service: Endoscopy;  Laterality: N/A;   LEFT HEART CATH AND CORONARY ANGIOGRAPHY N/A 05/18/2019   Procedure: LEFT HEART CATH AND CORONARY ANGIOGRAPHY;  Surgeon: Knox Perl, MD;  Location: [facility];  Service: Cardiovascular;  Laterality: N/A;   LEFT HEART CATH AND CORONARY ANGIOGRAPHY N/A 08/28/2021   Procedure: LEFT HEART CATH AND CORONARY ANGIOGRAPHY;  Surgeon: Cody Das, MD;  Location: [facility];  Service: Cardiovascular;  Laterality: N/A;   NASAL SINUS SURGERY  1981   RETINAL Bilateral 06/2013   Retinal peel   SHOULDER SURGERY  Right 2003   TONSILLECTOMY  1954    FAMILY HISTORY: Family History  Problem Relation Age of Onset   Heart disease Mother    Diabetes Mother    Neuropathy Mother    Colon cancer Father    Diabetes Sister    Lung cancer Brother    Sleep apnea Brother    Colon cancer Brother 7   Diabetes Maternal Grandmother    Heart attack Maternal Grandmother    Colon cancer Maternal Grandfather    Heart Problems Maternal Grandfather    Prostate cancer Maternal Grandfather    Cancer Maternal Grandfather        right eye   Heart attack Maternal Grandfather    Stomach cancer Neg Hx    Ulcerative colitis Neg Hx    Esophageal cancer Neg Hx    Parkinson's disease Neg Hx     SOCIAL HISTORY: Social History   Socioeconomic History   Marital status: Married    Spouse name: Ammon Bales   Number of children: 2   Years of education: 66  Highest education level: Not on file  Occupational History   Occupation: PRESSMAN    Employer: [employer]  Tobacco Use   Smoking status: Never   Smokeless tobacco: Never  Vaping Use   Vaping status: Never Used  Substance and Sexual Activity   Alcohol  use: No   Drug use: No   Sexual activity: Not Currently  Other Topics Concern   Not on file  Social History Narrative   Patient is married Ammon Bales) and lives at home with his wife.   Patient has two children (twins).   Patient is retired.   Patient has a high school education.   Patient does not drink any caffeine.   Patient is left-handed.   Social Drivers of Corporate investment banker Strain: Not on file  Food Insecurity: No Food Insecurity (08/03/2022)   Hunger Vital Sign    Worried About Running Out of Food in the Last Year: Never true    Ran Out of Food in the Last Year: Never true  Transportation Needs: No Transportation Needs (08/03/2022)   PRAPARE - Administrator, Civil Service (Medical): No    Lack of Transportation (Non-Medical): No  Physical Activity: Not on file  Stress:  Not on file  Social Connections: Unknown (06/04/2021)   Received from [facility]   Social Network    Social Network: Not on file  Intimate Partner Violence: Not At Risk (08/03/2022)   Humiliation, Afraid, Rape, and Kick questionnaire    Fear of Current or Ex-Partner: No    Emotionally Abused: No    Physically Abused: No    Sexually Abused: No      PHYSICAL EXAM  Vitals:   06/17/23 0952  Weight: 244 lb (110.7 kg)  Height: 5\' 10"  (1.778 m)  No data found. Orthostatic VS for the past 24 hrs (Last 3 readings):  BP- Lying Pulse- Lying BP- Standing at 0 minutes Pulse- Standing at 0 minutes BP- Standing at 3 minutes Pulse- Standing at 3 minutes  06/17/23 0954 133/79 62 125/79 64 128/75 64   ' Body mass index is 35.01 kg/m.  Generalized: Well developed, in no acute distress   Neurological examination  Mentation: Alert oriented to time, place, history taking. Follows all commands speech and language fluent Cranial nerve II-XII: Pupils were equal round reactive to light. Extraocular movements were full, visual field were full on confrontational test. Facial sensation and strength were normal. Uvula tongue midline. Head turning and shoulder shrug  were normal and symmetric. Motor: The motor testing reveals 5 over 5 strength of all 4 extremities.  Moderate impairment of finger taps in the upper extremities.  Left greater than right.  Moderate impairment of toe taps in the lower extremities. Sensory: Sensory testing is intact to soft touch on all 4 extremities. No evidence of extinction is noted.  Coordination: Cerebellar testing reveals good finger-nose-finger and heel-to-shin bilaterally.  Resting and action tremor noted in the left upper extremity. Gait and station: Decreased arm swing with ambulation.  Gait is slightly unsteady.  3-4 steps with turns.  Tandem gait not attempted.   DIAGNOSTIC DATA (LABS, IMAGING, TESTING) - I reviewed patient records, labs, notes,  testing and imaging myself where available.  Lab Results  Component Value Date   WBC 8.8 07/22/2022   HGB 14.9 07/22/2022   HCT 46.5 07/22/2022   MCV 90.5 07/22/2022   PLT 250 07/22/2022      Component Value Date/Time   NA 135 07/22/2022 1049  NA 139 08/20/2021 0851   K 4.4 07/22/2022 1049   CL 101 07/22/2022 1049   CO2 25 07/22/2022 1049   GLUCOSE 151 (H) 07/22/2022 1049   BUN 11 07/22/2022 1049   BUN 16 08/20/2021 0851   CREATININE 1.09 07/22/2022 1049   CALCIUM  9.3 07/22/2022 1049   PROT 7.6 11/07/2020 1527   ALBUMIN 4.5 11/07/2020 1527   AST 38 11/07/2020 1527   ALT 35 11/07/2020 1527   ALKPHOS 111 11/07/2020 1527   BILITOT 0.3 11/07/2020 1527   GFRNONAA >60 07/22/2022 1049   GFRAA >60 08/17/2019 1643   Lab Results  Component Value Date   CHOL 142 11/28/2021   HDL 34 (L) 11/28/2021   LDLCALC 74 11/28/2021   TRIG 205 (H) 11/28/2021   CHOLHDL 4.2 11/28/2021   Lab Results  Component Value Date   HGBA1C 6.5 (H) 07/22/2022      ASSESSMENT AND PLAN 78 y.o. year old male  has a past medical history of Anginal pain (HCC), Anxiety, Arthritis, BPH (benign prostatic hyperplasia), Coronary artery disease, Depression, Diabetes mellitus type 2, controlled (HCC), Fatty liver, GERD (gastroesophageal reflux disease), Gout, Heart disease, Heart murmur, Hernia, ventral, HTN (hypertension), Hyperlipidemia, Hypothyroidism, Nasal septal deformity (05/14/2013), Neuromuscular disorder (HCC) (2023), Neuropathy, Obesity (BMI 30.0-34.9) (05/14/2013), OSA on CPAP, Pancreatitis (dx march 2016), and Pneumonia (12 years ago). here with :   1.  Parkinson's disease 2.  Syncope 3.  Dizziness 4.  Abnormality of gait balance 5.  Dysphagia-sensation of choking with food and ongoing   Patient came in today for sooner appointment per recommendations from physical therapy.  He is not orthostatic today however he was when he was being seen by physical therapy.  I discussed his plan with Dr.  Albertina [name].  This certainly could be dysautonomia related to Parkinson's disease however we will complete an MRA of the brain to rule out any additional causes such as stenosis.  Will also do a swallow evaluation due to the patient's complaints of getting choked with food and liquids. Patient can resume physical therapy as long as he is feeling okay.  Will keep appointment in July for follow-up   Clem Currier, MSN, NP-C 06/17/2023, 10:25 AM [facility] [address] [place], [place] [address] [phone]

## 2023-06-18 ENCOUNTER — Telehealth: Payer: Self-pay | Admitting: Adult Health

## 2023-06-18 NOTE — Telephone Encounter (Signed)
I called the patient. Left voicemail. 

## 2023-06-19 ENCOUNTER — Telehealth: Payer: Self-pay | Admitting: Adult Health

## 2023-06-19 DIAGNOSIS — R600 Localized edema: Secondary | ICD-10-CM | POA: Diagnosis not present

## 2023-06-19 DIAGNOSIS — I119 Hypertensive heart disease without heart failure: Secondary | ICD-10-CM | POA: Diagnosis not present

## 2023-06-19 DIAGNOSIS — I509 Heart failure, unspecified: Secondary | ICD-10-CM | POA: Diagnosis not present

## 2023-06-19 DIAGNOSIS — G20A1 Parkinson's disease without dyskinesia, without mention of fluctuations: Secondary | ICD-10-CM | POA: Diagnosis not present

## 2023-06-19 DIAGNOSIS — J90 Pleural effusion, not elsewhere classified: Secondary | ICD-10-CM | POA: Diagnosis not present

## 2023-06-19 NOTE — Progress Notes (Signed)
 Addendum : I agree with management and plan for this Parkinson patient. Neomia Banner, MD

## 2023-06-19 NOTE — Telephone Encounter (Signed)
 no auth required sent to GI [phone]

## 2023-06-20 ENCOUNTER — Ambulatory Visit: Attending: Cardiology | Admitting: Cardiology

## 2023-06-20 ENCOUNTER — Encounter: Payer: Self-pay | Admitting: Cardiology

## 2023-06-20 ENCOUNTER — Inpatient Hospital Stay (HOSPITAL_COMMUNITY)
Admission: AD | Admit: 2023-06-20 | Discharge: 2023-06-24 | DRG: 291 | Disposition: A | Discharge status: Home / Self Care | Source: Ambulatory Visit | Attending: Cardiology | Admitting: Cardiology

## 2023-06-20 ENCOUNTER — Telehealth: Payer: Self-pay | Admitting: Cardiology

## 2023-06-20 ENCOUNTER — Encounter (HOSPITAL_COMMUNITY): Payer: Self-pay

## 2023-06-20 VITALS — BP 124/80 | HR 65 | Resp 16 | Ht 70.0 in | Wt 231.6 lb

## 2023-06-20 DIAGNOSIS — G4733 Obstructive sleep apnea (adult) (pediatric): Secondary | ICD-10-CM

## 2023-06-20 DIAGNOSIS — F419 Anxiety disorder, unspecified: Secondary | ICD-10-CM | POA: Diagnosis not present

## 2023-06-20 DIAGNOSIS — N400 Enlarged prostate without lower urinary tract symptoms: Secondary | ICD-10-CM | POA: Diagnosis present

## 2023-06-20 DIAGNOSIS — Z801 Family history of malignant neoplasm of trachea, bronchus and lung: Secondary | ICD-10-CM

## 2023-06-20 DIAGNOSIS — Z79890 Hormone replacement therapy (postmenopausal): Secondary | ICD-10-CM | POA: Diagnosis not present

## 2023-06-20 DIAGNOSIS — G20A1 Parkinson's disease without dyskinesia, without mention of fluctuations: Secondary | ICD-10-CM | POA: Diagnosis present

## 2023-06-20 DIAGNOSIS — F32A Depression, unspecified: Secondary | ICD-10-CM | POA: Diagnosis present

## 2023-06-20 DIAGNOSIS — I50811 Acute right heart failure: Secondary | ICD-10-CM | POA: Diagnosis not present

## 2023-06-20 DIAGNOSIS — Z9861 Coronary angioplasty status: Secondary | ICD-10-CM

## 2023-06-20 DIAGNOSIS — E113391 Type 2 diabetes mellitus with moderate nonproliferative diabetic retinopathy without macular edema, right eye: Secondary | ICD-10-CM | POA: Diagnosis present

## 2023-06-20 DIAGNOSIS — I25118 Atherosclerotic heart disease of native coronary artery with other forms of angina pectoris: Secondary | ICD-10-CM

## 2023-06-20 DIAGNOSIS — I2510 Atherosclerotic heart disease of native coronary artery without angina pectoris: Secondary | ICD-10-CM

## 2023-06-20 DIAGNOSIS — M109 Gout, unspecified: Secondary | ICD-10-CM | POA: Diagnosis not present

## 2023-06-20 DIAGNOSIS — R0989 Other specified symptoms and signs involving the circulatory and respiratory systems: Secondary | ICD-10-CM | POA: Diagnosis not present

## 2023-06-20 DIAGNOSIS — E039 Hypothyroidism, unspecified: Secondary | ICD-10-CM | POA: Insufficient documentation

## 2023-06-20 DIAGNOSIS — K760 Fatty (change of) liver, not elsewhere classified: Secondary | ICD-10-CM | POA: Diagnosis present

## 2023-06-20 DIAGNOSIS — I5033 Acute on chronic diastolic (congestive) heart failure: Secondary | ICD-10-CM | POA: Diagnosis present

## 2023-06-20 DIAGNOSIS — Z833 Family history of diabetes mellitus: Secondary | ICD-10-CM | POA: Diagnosis not present

## 2023-06-20 DIAGNOSIS — Z7982 Long term (current) use of aspirin: Secondary | ICD-10-CM | POA: Diagnosis not present

## 2023-06-20 DIAGNOSIS — E118 Type 2 diabetes mellitus with unspecified complications: Secondary | ICD-10-CM | POA: Diagnosis not present

## 2023-06-20 DIAGNOSIS — I350 Nonrheumatic aortic (valve) stenosis: Secondary | ICD-10-CM | POA: Diagnosis not present

## 2023-06-20 DIAGNOSIS — I110 Hypertensive heart disease with heart failure: Secondary | ICD-10-CM | POA: Diagnosis not present

## 2023-06-20 DIAGNOSIS — K219 Gastro-esophageal reflux disease without esophagitis: Secondary | ICD-10-CM | POA: Diagnosis present

## 2023-06-20 DIAGNOSIS — R0602 Shortness of breath: Secondary | ICD-10-CM

## 2023-06-20 DIAGNOSIS — Z79899 Other long term (current) drug therapy: Secondary | ICD-10-CM | POA: Diagnosis not present

## 2023-06-20 DIAGNOSIS — G20C Parkinsonism, unspecified: Secondary | ICD-10-CM | POA: Diagnosis present

## 2023-06-20 DIAGNOSIS — E1142 Type 2 diabetes mellitus with diabetic polyneuropathy: Secondary | ICD-10-CM | POA: Diagnosis not present

## 2023-06-20 DIAGNOSIS — R0609 Other forms of dyspnea: Secondary | ICD-10-CM | POA: Diagnosis not present

## 2023-06-20 DIAGNOSIS — Z7984 Long term (current) use of oral hypoglycemic drugs: Secondary | ICD-10-CM | POA: Diagnosis not present

## 2023-06-20 DIAGNOSIS — R918 Other nonspecific abnormal finding of lung field: Secondary | ICD-10-CM | POA: Diagnosis not present

## 2023-06-20 DIAGNOSIS — I5030 Unspecified diastolic (congestive) heart failure: Secondary | ICD-10-CM

## 2023-06-20 DIAGNOSIS — Z8042 Family history of malignant neoplasm of prostate: Secondary | ICD-10-CM

## 2023-06-20 DIAGNOSIS — Z8249 Family history of ischemic heart disease and other diseases of the circulatory system: Secondary | ICD-10-CM

## 2023-06-20 DIAGNOSIS — Z794 Long term (current) use of insulin: Secondary | ICD-10-CM | POA: Diagnosis not present

## 2023-06-20 DIAGNOSIS — I209 Angina pectoris, unspecified: Secondary | ICD-10-CM

## 2023-06-20 DIAGNOSIS — M7989 Other specified soft tissue disorders: Secondary | ICD-10-CM

## 2023-06-20 DIAGNOSIS — I509 Heart failure, unspecified: Principal | ICD-10-CM | POA: Insufficient documentation

## 2023-06-20 DIAGNOSIS — I10 Essential (primary) hypertension: Secondary | ICD-10-CM | POA: Diagnosis present

## 2023-06-20 DIAGNOSIS — Z800 Family history of malignant neoplasm of digestive organs: Secondary | ICD-10-CM | POA: Diagnosis not present

## 2023-06-20 DIAGNOSIS — Z888 Allergy status to other drugs, medicaments and biological substances status: Secondary | ICD-10-CM

## 2023-06-20 LAB — CBC
HCT: 40.6 % (ref 39.0–52.0)
Hemoglobin: 13.3 g/dL (ref 13.0–17.0)
MCH: 28.3 pg (ref 26.0–34.0)
MCHC: 32.8 g/dL (ref 30.0–36.0)
MCV: 86.4 fL (ref 80.0–100.0)
Platelets: 262 10*3/uL (ref 150–400)
RBC: 4.7 MIL/uL (ref 4.22–5.81)
RDW: 16.6 % — ABNORMAL HIGH (ref 11.5–15.5)
WBC: 8 10*3/uL (ref 4.0–10.5)
nRBC: 0 % (ref 0.0–0.2)

## 2023-06-20 LAB — COMPREHENSIVE METABOLIC PANEL WITH GFR
ALT: 25 U/L (ref 0–44)
AST: 31 U/L (ref 15–41)
Albumin: 3.7 g/dL (ref 3.5–5.0)
Alkaline Phosphatase: 72 U/L (ref 38–126)
Anion gap: 11 (ref 5–15)
BUN: 9 mg/dL (ref 8–23)
CO2: 30 mmol/L (ref 22–32)
Calcium: 9.7 mg/dL (ref 8.9–10.3)
Chloride: 97 mmol/L — ABNORMAL LOW (ref 98–111)
Creatinine, Ser: 1.19 mg/dL (ref 0.61–1.24)
GFR, Estimated: >60 mL/min (ref >60)
Glucose, Bld: 79 mg/dL (ref 70–99)
Potassium: 4.2 mmol/L (ref 3.5–5.1)
Sodium: 138 mmol/L (ref 135–145)
Total Bilirubin: 0.8 mg/dL (ref 0.0–1.2)
Total Protein: 7.4 g/dL (ref 6.5–8.1)

## 2023-06-20 LAB — GLUCOSE, CAPILLARY
Glucose-Capillary: 89 mg/dL (ref 70–99)
Glucose-Capillary: 114 mg/dL — ABNORMAL HIGH (ref 70–99)

## 2023-06-20 LAB — BRAIN NATRIURETIC PEPTIDE: B Natriuretic Peptide: 291.2 pg/mL — ABNORMAL HIGH (ref 0.0–100.0)

## 2023-06-20 LAB — TROPONIN I (HIGH SENSITIVITY)
Troponin I (High Sensitivity): 6 ng/L (ref <18)
Troponin I (High Sensitivity): 7 ng/L (ref <18)

## 2023-06-20 MED ORDER — POLYVINYL ALCOHOL 1.4 % OP SOLN
1.0000 [drp] | Freq: Four times a day (QID) | OPHTHALMIC | Status: DC
  Administered 2023-06-20 – 2023-06-24 (×15): 1 [drp] via OPHTHALMIC
  Filled 2023-06-20: qty 15

## 2023-06-20 MED ORDER — HEPARIN SODIUM (PORCINE) 5000 UNIT/ML IJ SOLN
5000.0000 [IU] | Freq: Three times a day (TID) | INTRAMUSCULAR | Status: DC
  Administered 2023-06-20 – 2023-06-24 (×12): 5000 [IU] via SUBCUTANEOUS
  Filled 2023-06-20 (×13): qty 1

## 2023-06-20 MED ORDER — MIRABEGRON ER 50 MG PO TB24
50.0000 mg | ORAL_TABLET | Freq: Every day | ORAL | Status: DC
  Administered 2023-06-21 – 2023-06-24 (×4): 50 mg via ORAL
  Filled 2023-06-20 (×4): qty 1

## 2023-06-20 MED ORDER — BUSPIRONE HCL 5 MG PO TABS
5.0000 mg | ORAL_TABLET | Freq: Two times a day (BID) | ORAL | Status: DC
  Administered 2023-06-20 – 2023-06-24 (×8): 5 mg via ORAL
  Filled 2023-06-20 (×8): qty 1

## 2023-06-20 MED ORDER — METHOCARBAMOL 500 MG PO TABS
500.0000 mg | ORAL_TABLET | Freq: Four times a day (QID) | ORAL | Status: DC | PRN
  Administered 2023-06-21: 500 mg via ORAL
  Filled 2023-06-20 (×2): qty 1

## 2023-06-20 MED ORDER — VITAMIN B-6 100 MG PO TABS
100.0000 mg | ORAL_TABLET | Freq: Every day | ORAL | Status: DC
  Administered 2023-06-21 – 2023-06-24 (×4): 100 mg via ORAL
  Filled 2023-06-20 (×4): qty 1

## 2023-06-20 MED ORDER — DULOXETINE HCL 30 MG PO CPEP
30.0000 mg | ORAL_CAPSULE | Freq: Every day | ORAL | Status: DC
  Administered 2023-06-21 – 2023-06-24 (×4): 30 mg via ORAL
  Filled 2023-06-20 (×4): qty 1

## 2023-06-20 MED ORDER — ONDANSETRON HCL 4 MG/2ML IJ SOLN: 4.0000 mg | Freq: Four times a day (QID) | INTRAMUSCULAR | Status: DC | PRN

## 2023-06-20 MED ORDER — LOSARTAN POTASSIUM 50 MG PO TABS
50.0000 mg | ORAL_TABLET | Freq: Every day | ORAL | Status: DC
  Administered 2023-06-21: 50 mg via ORAL
  Filled 2023-06-20: qty 1

## 2023-06-20 MED ORDER — ISOSORBIDE MONONITRATE ER 60 MG PO TB24
60.0000 mg | ORAL_TABLET | Freq: Every day | ORAL | Status: DC
  Administered 2023-06-21 – 2023-06-23 (×3): 60 mg via ORAL
  Filled 2023-06-20 (×3): qty 1

## 2023-06-20 MED ORDER — DIVALPROEX SODIUM ER 500 MG PO TB24
500.0000 mg | ORAL_TABLET | Freq: Every day | ORAL | Status: DC
  Administered 2023-06-20 – 2023-06-23 (×4): 500 mg via ORAL
  Filled 2023-06-20 (×5): qty 1

## 2023-06-20 MED ORDER — ROSUVASTATIN CALCIUM 20 MG PO TABS
20.0000 mg | ORAL_TABLET | Freq: Every day | ORAL | Status: DC
  Administered 2023-06-20 – 2023-06-24 (×5): 20 mg via ORAL
  Filled 2023-06-20 (×5): qty 1

## 2023-06-20 MED ORDER — INSULIN GLARGINE-YFGN 100 UNIT/ML ~~LOC~~ SOLN
30.0000 [IU] | Freq: Every day | SUBCUTANEOUS | Status: DC
  Administered 2023-06-20 – 2023-06-23 (×4): 30 [IU] via SUBCUTANEOUS
  Filled 2023-06-20 (×5): qty 0.3

## 2023-06-20 MED ORDER — TAMSULOSIN HCL 0.4 MG PO CAPS
0.4000 mg | ORAL_CAPSULE | Freq: Every evening | ORAL | Status: DC
  Administered 2023-06-20 – 2023-06-24 (×5): 0.4 mg via ORAL
  Filled 2023-06-20 (×5): qty 1

## 2023-06-20 MED ORDER — FLUTICASONE PROPIONATE 50 MCG/ACT NA SUSP
1.0000 | Freq: Every day | NASAL | Status: DC | PRN
  Filled 2023-06-20: qty 16

## 2023-06-20 MED ORDER — SODIUM CHLORIDE 0.9% FLUSH
3.0000 mL | Freq: Two times a day (BID) | INTRAVENOUS | Status: DC
  Administered 2023-06-20 – 2023-06-24 (×6): 3 mL via INTRAVENOUS

## 2023-06-20 MED ORDER — ALLOPURINOL 300 MG PO TABS
300.0000 mg | ORAL_TABLET | Freq: Every day | ORAL | Status: DC
  Administered 2023-06-21 – 2023-06-24 (×4): 300 mg via ORAL
  Filled 2023-06-20 (×4): qty 1

## 2023-06-20 MED ORDER — ACETAMINOPHEN 325 MG PO TABS: 650.0000 mg | ORAL_TABLET | ORAL | Status: DC | PRN

## 2023-06-20 MED ORDER — FUROSEMIDE 10 MG/ML IJ SOLN
60.0000 mg | Freq: Two times a day (BID) | INTRAMUSCULAR | Status: AC
Start: 2023-06-20 — End: 2023-06-22
  Administered 2023-06-20 – 2023-06-22 (×5): 60 mg via INTRAVENOUS
  Filled 2023-06-20 (×5): qty 6

## 2023-06-20 MED ORDER — AMLODIPINE BESYLATE 5 MG PO TABS
5.0000 mg | ORAL_TABLET | Freq: Every day | ORAL | Status: DC
  Administered 2023-06-21: 5 mg via ORAL
  Filled 2023-06-20: qty 2

## 2023-06-20 MED ORDER — ASPIRIN 81 MG PO TBEC
81.0000 mg | DELAYED_RELEASE_TABLET | Freq: Every day | ORAL | Status: DC
  Administered 2023-06-21 – 2023-06-24 (×4): 81 mg via ORAL
  Filled 2023-06-20 (×4): qty 1

## 2023-06-20 MED ORDER — EMPAGLIFLOZIN 25 MG PO TABS
25.0000 mg | ORAL_TABLET | Freq: Every day | ORAL | Status: DC
  Administered 2023-06-21: 25 mg via ORAL
  Filled 2023-06-20: qty 1

## 2023-06-20 MED ORDER — SODIUM CHLORIDE 0.9 % IV SOLN: 250.0000 mL | INTRAVENOUS | Status: AC | PRN

## 2023-06-20 MED ORDER — LEVOTHYROXINE SODIUM 25 MCG PO TABS
25.0000 ug | ORAL_TABLET | Freq: Every day | ORAL | Status: DC
  Administered 2023-06-21 – 2023-06-24 (×4): 25 ug via ORAL
  Filled 2023-06-20 (×4): qty 1

## 2023-06-20 MED ORDER — SODIUM CHLORIDE 0.9% FLUSH: 3.0000 mL | INTRAVENOUS | Status: DC | PRN

## 2023-06-20 MED ORDER — TRAZODONE HCL 50 MG PO TABS
50.0000 mg | ORAL_TABLET | Freq: Every day | ORAL | Status: DC
  Administered 2023-06-20 – 2023-06-23 (×4): 50 mg via ORAL
  Filled 2023-06-20 (×4): qty 1

## 2023-06-20 MED ORDER — ACETAMINOPHEN 500 MG PO TABS
500.0000 mg | ORAL_TABLET | Freq: Four times a day (QID) | ORAL | Status: DC | PRN
  Administered 2023-06-21: 500 mg via ORAL
  Filled 2023-06-20: qty 1

## 2023-06-20 MED ORDER — FAMOTIDINE 20 MG PO TABS
40.0000 mg | ORAL_TABLET | Freq: Every day | ORAL | Status: DC | PRN
  Filled 2023-06-20: qty 2

## 2023-06-20 MED ORDER — GABAPENTIN 300 MG PO CAPS
600.0000 mg | ORAL_CAPSULE | Freq: Three times a day (TID) | ORAL | Status: DC
  Administered 2023-06-20 – 2023-06-24 (×12): 600 mg via ORAL
  Filled 2023-06-20 (×12): qty 2

## 2023-06-20 NOTE — Telephone Encounter (Signed)
 Called and spoke to pt. His main complaint is swelling in BLE, starting since Wednesday. Swelling starts from groin area all the way down to feet. Tops of feet are red/warm. Left leg swollen more than the right. He saw PCP (Dr. Antionette Kirks PA) on 06/19/23; they started him on Lasix  40 mg q Day. He has taken one dose, today. They drew blood and did CXR, which showed fluid, per pt. He denies CP, but has been SOB and is not able to lay flat in bed. He fell once in his driveway and twice inside his home, this past week. He plans to see PCP again on 06/23/23 at 9:30 am.

## 2023-06-20 NOTE — Telephone Encounter (Signed)
 Happy to see him today.  Thanks MJP

## 2023-06-20 NOTE — Telephone Encounter (Signed)
 Pt c/o swelling/edema: STAT if pt has developed SOB within 24 hours  If swelling, where is the swelling located? Legs   How much weight have you gained and in what time span? Swelling started last Wednesday, feels like he has gained weight but not sure how much    Have you gained 2 pounds in a day or 5 pounds in a week? Not sure   Do you have a log of your daily weights (if so, list)? Not sure   Are you currently taking a fluid pill? Yes   Are you currently SOB? Not currently   Have you traveled recently in a car or plane for an extended period of time? No

## 2023-06-20 NOTE — Progress Notes (Signed)
 Cardiology Office Note:  .   Date:  06/20/2023  ID:  Anthony [name], DOB 1945-03-23, MRN [id number] PCP: Neomia Banner, MD  [facility] Providers Cardiologist:  Fransico Ivy, MD PCP: Neomia Banner, MD  Chief Complaint  Patient presents with   Edema   Shortness of Breath   Follow-up      History of Present Illness: .    Anthony [name] is a 78 y.o. male with hypertension, hyperlipidemia, CAD, type 2 DM, mild AS, OSA on CPAP, hypothyroidism, Parkinson's disease, GERD   Patient was last seen by me in office in 01/2023, and has been doing well without any significant complaints.  Over the last 7-10 days, patient has had rapidly progressive bilateral leg edema, and orthopnea symptoms.  He was started on Lasix  40 mg daily by his PCP with some improvement.  However, he still remains very edematous, also has abdominal bloating sensation.   He denies any chest pain to me.  He does report 2 episodes of fall.  1 occurred while walking in the driveway when he turned quickly followed by sudden blackness in front of his eyes and fall.  He reports that he did not completely lose consciousness.  Another episode occurred while working in the kitchen when he felt lightheaded and fell down.  Again, denies any loss of consciousness.  History is corroborated by his wife who is present with him today.   EKG today did show some new changes compared to his EKG in 01/2023 with subtle Q waves in inferior leads, as well as inferolateral T wave inversion, but no acute STEMI.  Vitals:   06/20/23 1555  BP: 124/80  Pulse: 65  Resp: 16  SpO2: 95%      ROS:  Review of Systems  Cardiovascular:  Positive for leg swelling and orthopnea. Negative for chest pain, dyspnea on exertion, palpitations and syncope.     Studies Reviewed: Aaron Aas        EKG 06/20/2023: Sinus bradycardia Rightward axis Possible Inferior infarct , age undetermined When compared with ECG of 28-Aug-2021  11:57, Borderline criteria for Inferior infarct are now Present Non-specific change in ST segment in Inferior leads T wave inversion now evident in Inferior leads T wave inversion now evident in Lateral leads    Independently interpreted 4-05/2023: HbA1C 6.0%  09/2022: Chol 141, TG 167, HDL 35, LDL 77 HbA1C 6.0% Hb 13.5 Cr 0.9  Physical Exam:   Physical Exam Vitals and nursing note reviewed.  Constitutional:      General: He is not in acute distress. Neck:     Vascular: No JVD.  Cardiovascular:     Rate and Rhythm: Normal rate and regular rhythm.     Heart sounds: Murmur heard.     Harsh midsystolic murmur is present with a grade of 2/6 at the upper right sternal border radiating to the neck.  Pulmonary:     Effort: Pulmonary effort is normal.     Breath sounds: Normal breath sounds. No wheezing or rales.  Abdominal:     Comments: Distension  Musculoskeletal:     Right lower leg: Edema (3+) present.     Left lower leg: Edema (3+) present.      VISIT DIAGNOSES:   ICD-10-CM   1. Shortness of breath  R06.02 EKG 12-Lead    2. Acute right-sided congestive heart failure (HCC)  I50.811     3. Coronary artery disease involving native coronary artery of native heart without angina pectoris  I25.10  ASSESSMENT AND PLAN: .    Anthony [name] is a 78 y.o. male with hypertension, hyperlipidemia, mod nonobstructive CAD, type 2 DM, OSA on CPAP, hypothyroidism, GERD   Exertional dyspnea, leg edema: Symptoms consistent with acute heart failure, possibly right-sided heart failure. EKG shows new inferior Q waves, as well as inferolateral T wave inversion.  I wonder if he had a silent MI at some point now leading to right heart failure. Check echocardiogram, strict intake/output, BNP, high sensitive troponin. Will start on IV Lasix  40 mg twice daily for now.  Diuretic regimen may need to be uptitrated depending on his response. Will hold home medication metoprolol  at this  time, given acute decompensation of his heart failure. Hold lisinopril  given possibility of need for Entresto, placed on losartan 50 mg daily instead.   CAD: No anginal symptoms, remains to be seen if he had a silent MI recently.  Echocardiogram pending. Will check troponin to look for any high values that could suggest downtrending troponin from possible MI recently. Known prior PTCA to distal LAD. Continue aspirin , statin. Holding metoprolol  due to acute heart failure.   Type 2 diabetes mellitus: Continue Jardiance  25 mg daily. On 32 units insulin  at home.  Will administer 30 units here, along with sliding scale insulin .   Presyncope/fall: I suspect he may have neuropathy leading to some of the recent falls, however, arrhythmia as cause of his presyncope symptoms cannot be excluded.  If hospitalization workup does not show any acute cardiac etiology for his symptoms, he may need outpatient cardiac telemetry after discharge.   Hypertension: Controlled   Parkinson's disease: Continue baseline Parkinson's medications.   Direct admit to hospital  Signed, Cody Das, MD

## 2023-06-20 NOTE — Patient Instructions (Signed)
 DIRECT ADMIT TO [facility]   Follow-Up: At [facility], you and your health needs are our priority.  As part of our continuing mission to provide you with exceptional heart care, our providers are all part of one team.  This team includes your primary Cardiologist (physician) and Advanced Practice Providers or APPs (Physician Assistants and Nurse Practitioners) who all work together to provide you with the care you need, when you need it.  Your next appointment:   TO BE DETERMINED   Provider:   Cody Das, MD

## 2023-06-20 NOTE — Telephone Encounter (Signed)
 Called and spoke to wife; they plan to see Dr. Filiberto Hug today at 4:00 pm. Order for Bilateral LE Venous Dopplers entered as STAT.

## 2023-06-20 NOTE — H&P (Signed)
 Cardiology Admission History and Physical   Patient ID: Anthony [name] MRN: [id number]; DOB: 1945/09/22   Admission date: 06/20/2023  PCP:  Neomia Banner, MD   [facility] Providers Cardiologist:  Cody Das, MD   {   Chief Complaint: Shortness of breath, leg edema   History of Present Illness:   Anthony [name] is a 78 year old male with hypertension, hyperlipidemia, CAD, type 2 DM, mild AS, OSA on CPAP, hypothyroidism, Parkinson's disease, GERD  Patient was last seen by me in office in 01/2023, and has been doing well without any significant complaints.  Over the last 7-10 days, patient has had rapidly progressive bilateral leg edema, and orthopnea symptoms.  He was started on Lasix  40 mg daily by his PCP with some improvement.  However, he still remains very edematous, also has abdominal bloating sensation.  He denies any chest pain to me.  He does report 2 episodes of fall.  1 occurred while walking in the driveway when he turned quickly followed by sudden blackness in front of his eyes and fall.  He reports that he did not completely lose consciousness.  Another episode occurred while working in the kitchen when he felt lightheaded and fell down.  Again, denies any loss of consciousness.  History is corroborated by his wife who is present with him today.  EKG today did show some new changes compared to his EKG in 01/2023 with subtle Q waves in inferior leads, as well as inferolateral T wave inversion, but no acute STEMI.   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   Neuromuscular disorder (HCC) 2023   Parkinson's Disease   Neuropathy    left leg greater than right leg   Obesity (BMI 30.0-34.9) 05/14/2013   OSA on CPAP    cpap setting of 10/ 13   Pancreatitis dx march 2016   Pneumonia 12 years ago    Past Surgical History:  Procedure Laterality Date   ANTERIOR CERVICAL DECOMP/DISCECTOMY FUSION N/A 08/02/2022   Procedure: Anterior Cervical Decompression/Discectomy Fusion - Cervical Three-Cervical Four,  Cervical Four-Cervical Five,  remove Cervical Five-Cervical Six Plate;  Surgeon: Joaquin Mulberry, MD;  Location: [facility] OR;  Service: Neurosurgery;  Laterality: N/A;  3C   BACK SURGERY  10/2009   Cervical, arterior   CARPAL TUNNEL RELEASE Left 2003   CARPAL TUNNEL RELEASE Bilateral    CATARACT EXTRACTION Bilateral 01/2012   COLONOSCOPY  2024   CORONARY BALLOON ANGIOPLASTY N/A 08/28/2021   Procedure: CORONARY BALLOON ANGIOPLASTY;  Surgeon: Cody Das, MD;  Location: [facility] INVASIVE CV LAB;  Service: Cardiovascular;  Laterality: N/A;   EUS N/A 07/15/2014   Procedure: FULL UPPER ENDOSCOPIC ULTRASOUND (EUS) RADIAL;  Surgeon: Alvis Jourdain, MD;  Location: [facility] ENDOSCOPY;  Service: Endoscopy;  Laterality: N/A;   LEFT HEART CATH AND CORONARY ANGIOGRAPHY N/A 05/18/2019   Procedure: LEFT HEART CATH AND CORONARY ANGIOGRAPHY;  Surgeon: Knox Perl, MD;  Location: [facility] INVASIVE CV LAB;  Service: Cardiovascular;  Laterality: N/A;   LEFT HEART CATH AND CORONARY ANGIOGRAPHY N/A 08/28/2021   Procedure: LEFT HEART CATH AND CORONARY ANGIOGRAPHY;  Surgeon: Cody Das, MD;  Location: [facility] INVASIVE CV LAB;  Service: Cardiovascular;  Laterality: N/A;   NASAL SINUS SURGERY  1981   RETINAL Bilateral 06/2013   Retinal peel   SHOULDER SURGERY Right 2003   TONSILLECTOMY  1954     Medications Prior to Admission: Prior to Admission medications   Medication Sig Start Date End Date Taking? Authorizing Provider   acetaminophen  (TYLENOL ) 500 MG tablet Take 500 mg by mouth every 6 (six) hours as needed for moderate pain.    [provider]  allopurinol  (ZYLOPRIM ) 300 MG tablet Take 300 mg by mouth daily.  05/11/13   [provider]  Alogliptin  Benzoate 25 MG TABS Take 25 mg by mouth daily.    [provider]  amLODipine  (NORVASC ) 5 MG tablet Take 5 mg by mouth daily.    [provider]  aspirin  EC 81 MG tablet Take 1 tablet (81 mg total) by mouth daily. Swallow whole. 08/06/22   Cosentino, Christen Cover, PA-C  busPIRone  (BUSPAR ) 10 MG tablet Take 5 mg by mouth 2 (two) times daily.    [provider]  carboxymethylcellulose (REFRESH PLUS) 0.5 % SOLN Place 1 drop into both eyes 4 (four) times daily.    [provider]  Carboxymethylcellulose Sod PF 0.5 % SOLN Apply to eye. 03/18/22   [provider]  colchicine 0.6 MG tablet Take 1.2 mg by mouth daily as needed (for gout).    [provider]  divalproex  (DEPAKOTE  ER) 500 MG 24 hr tablet Take 500 mg by mouth at bedtime.    [provider]  DULoxetine  (CYMBALTA ) 30 MG capsule Take 30 mg by mouth daily.    [provider]  empagliflozin  (JARDIANCE ) 25 MG TABS tablet Take 25 mg by mouth daily.    [provider]  famotidine (PEPCID) 40 MG tablet Take 40 mg by mouth daily as needed (gerd).    [provider]  fluticasone  (FLONASE ) 50 MCG/ACT nasal spray Place 1 spray into both nostrils daily as needed for allergies or rhinitis.    [provider]  furosemide  (LASIX ) 40 MG tablet Take 40 mg by mouth daily.    [provider]  gabapentin  (NEURONTIN ) 600 MG tablet Take 600 mg by mouth 3 (three) times daily.    [provider]  Homeopathic Products [facility] RELIEF EX) Apply 1 Application topically daily as needed (leg cramps).    [provider]  insulin  glargine-yfgn (SEMGLEE) 100 UNIT/ML Pen Inject 42 Units into the skin at  bedtime. 06/06/21   [provider]  Investigational - Study Medication Inject 1 Dose as directed every 6 (six) months. Study name: Lepodisiran on the Reduction of MACE w/ Elevated Lp(a) in Established CVD or High Risk for CVD (J3L-MC-EZEF-ACCLAIM-Lp(a))  Additional study details: [employer] [phone]    [provider]  isosorbide  mononitrate (IMDUR ) 60 MG 24 hr tablet TAKE 1 TABLET BY MOUTH EVERY DAY 02/21/21   Cantwell, Celeste C, PA-C  ketoconazole  (NIZORAL ) 2 % shampoo Apply 1 Application topically 2 (two) times a week. 12/26/22   Dohmeier, Raoul Byes, MD  lisinopril  (ZESTRIL ) 40 MG tablet Take 40 mg by mouth daily.    [provider]  methocarbamol  (ROBAXIN ) 500 MG tablet Take 1 tablet (500 mg total)  by mouth every 6 (six) hours as needed for muscle spasms. 08/03/22   Cosentino, Allison R, PA-C  metoprolol  succinate (TOPROL -XL) 100 MG 24 hr tablet Take 1 tablet (100 mg total) by mouth daily. Take with or immediately following a meal. 03/07/23   Yanai Hobson J, MD  metroNIDAZOLE (METROGEL) 0.75 % gel Apply topically. 10/29/19   [provider]  mirabegron  ER (MYRBETRIQ ) 50 MG TB24 tablet Take 50 mg by mouth daily.    [provider]  nitroGLYCERIN  (NITROSTAT ) 0.4 MG SL tablet Place 0.4 mg under the tongue every 5 (five) minutes as needed for chest pain.    [provider]  pantoprazole  (PROTONIX ) 40 MG tablet Take protonix  40 mg twice daily for 2 months.  Resume once per day after that. Patient taking differently: Take 40 mg by mouth daily. 11/23/19   Mansouraty, Gabriel Jr., MD  pyridOXINE (VITAMIN B-6) 100 MG tablet Take 100 mg by mouth daily.    [provider]  rosuvastatin  (CRESTOR ) 20 MG tablet TAKE 1 TABLET BY MOUTH EVERY DAY 03/07/23   Nevin Grizzle J, MD  Sodium Fluoride 1.1 % PSTE Place 1 Application onto teeth 2 (two) times daily.    [provider]  solifenacin (VESICARE) 5 MG tablet Take 5 mg by  mouth. 07/20/22   [provider]  SYNTHROID  25 MCG tablet Take 25 mcg by mouth daily before breakfast. 05/11/13   [provider]  tamsulosin  (FLOMAX ) 0.4 MG CAPS capsule Take 0.4 mg by mouth every evening.  02/16/13   [provider]  traZODone  (DESYREL ) 50 MG tablet Take 50 mg by mouth at bedtime.    [provider]  Caryle Class Oil (SOOTHE NIGHTTIME) OINT Apply 1 Application to eye at bedtime.    [provider]     Allergies:    Allergies  Allergen Reactions   Lyrica [Pregabalin]     Delirium, Dizziness, Drowsy   Metformin  Diarrhea    Social History:   Social History   Socioeconomic History   Marital status: Married    Spouse name: Ammon Bales   Number of children: 2   Years of education: 12   Highest education level: Not on file  Occupational History   Occupation: [employer]    Employer: [employer]  Tobacco Use   Smoking status: Never   Smokeless tobacco: Never  Vaping Use   Vaping status: Never Used  Substance and Sexual Activity   Alcohol  use: No   Drug use: No   Sexual activity: Not Currently  Other Topics Concern   Not on file  Social History Narrative   Patient is married Ammon Bales) and lives at home with his wife.   Patient has two children (twins).   Patient is retired.   Patient has a high school education.   Patient does not drink any caffeine.   Patient is left-handed.   Social Drivers of Corporate investment banker Strain: Not on file  Food Insecurity: No Food Insecurity (08/03/2022)   Hunger Vital Sign    Worried About Running Out of Food in the Last Year: Never true    Ran Out of Food in the Last Year: Never true  Transportation Needs: No Transportation Needs (08/03/2022)   PRAPARE - Administrator, Civil Service (Medical): No    Lack of Transportation (Non-Medical): No  Physical Activity: Not on file  Stress: Not on file  Social Connections: Unknown (06/04/2021)   [facility]  [facility]  Social Network    Social Network: Not on file  Intimate Partner Violence: Not At Risk (08/03/2022)   Humiliation, Afraid, Rape, and Kick questionnaire    Fear of Current or Ex-Partner: No    Emotionally Abused: No    Physically Abused: No    Sexually Abused: No    Family History:   The patient's family history includes Cancer in his maternal grandfather; Colon cancer in his father and maternal grandfather; Colon cancer (age of onset: 74) in his brother; Diabetes in his maternal grandmother, mother, and sister; Heart Problems in his maternal grandfather; Heart attack in his maternal grandfather and maternal grandmother; Heart disease in his mother; Lung cancer in his brother; Neuropathy in his mother; Prostate cancer in his maternal grandfather; Sleep apnea in his brother. There is no history of Stomach cancer, Ulcerative colitis, Esophageal cancer, or Parkinson's disease.    ROS:  Please see the history of present illness.  All other ROS reviewed and negative.     Physical Exam/Data:   Vitals:   06/20/23 1711  BP: (!) 143/96  Pulse: 65  Resp: 18  Temp: 97.9 F (36.6 C)  TempSrc: Oral  SpO2: 96%  Weight: 103.9 kg  Height: 5' 10.5" (1.791 m)   No intake or output data in the 24 hours ending 06/20/23 1638    06/20/2023    3:55 PM 06/17/2023    9:52 AM 02/07/2023    3:40 PM  Last 3 Weights  Weight (lbs) 231 lb 9.6 oz 244 lb 231 lb 9.6 oz  Weight (kg) 105.053 kg 110.678 kg 105.053 kg     Body mass index is 32.39 kg/m.   Physical Exam Vitals and nursing note reviewed.  Constitutional:      General: He is not in acute distress. Neck:     Vascular: No JVD.  Cardiovascular:     Rate and Rhythm: Normal rate and regular rhythm.     Heart sounds: Murmur heard.     Harsh midsystolic murmur is present with a grade of 2/6 at the upper right sternal border radiating to the neck.  Pulmonary:     Effort: Pulmonary effort is normal.     Breath sounds:  Normal breath sounds. No wheezing or rales.  Abdominal:     General: There is distension.  Musculoskeletal:     Right lower leg: Edema (3+) present.     Left lower leg: Edema (3+) present.      EKG:  The ECG that was done on 06/20/2023 was personally reviewed and demonstrates  Sinus bradycardia Rightward axis Possible Inferior infarct , age undetermined When compared with ECG of 28-Aug-2021 11:57, Borderline criteria for Inferior infarct are now Present Non-specific change in ST segment in Inferior leads T wave inversion now evident in Inferior leads T wave inversion now evident in Lateral leads Confirmed by Fransico Ivy [phone]) on 06/20/2023 4:  Relevant CV Studies: Echocardiogram 02/2023: LVEF 60 to 65%, grade 1 diastolic dysfunction.  GLS -26.6%, normal. Normal RV size and systolic function. Severe calcification of the aortic valve.  Mild aortic stenosis.  Coronary intervention 08/2021: LM: Normal LAD: Prox-mid 50% disease (Unchanged compared to cath 2021)         Distal focal 75% stenosis (New since cath 2021) Lcx: Prox-mid 50% disease (Unchanged compared to cath 2021) Ramus: Prox 50% disease (Unchanged compared to cath 2021) RCA: Prox-distal 30% disease (Unchanged compared to cath 2021) Lcx: Prox-mid 50% disease   Successful PTCA distal LAD  w/2.0X12 mm  balloon at 6 atm Excellent results, thus stenting deferred.  Laboratory Data: Pending CBC, CMP, BNP, trop  Radiology/Studies:  Pending CXR   Assessment and Plan:   78 y/o male w/ hypertension, hyperlipidemia, CAD, type 2 DM, mild AS, OSA on CPAP, hypothyroidism, Parkinson's disease, GERD  Exertional dyspnea, leg edema: Symptoms consistent with acute heart failure, possibly right-sided heart failure. EKG shows new inferior Q waves, as well as inferolateral T wave inversion.  I wonder if he had a silent MI at some point now leading to right heart failure. Check echocardiogram, strict intake/output, BNP, high  sensitive troponin. Will start on IV Lasix  40 mg twice daily for now.  Diuretic regimen may need to be uptitrated depending on his response. Will hold home medication metoprolol  at this time, given acute decompensation of his heart failure. Hold lisinopril  given possibility of need for Entresto, placed on losartan 50 mg daily instead.  CAD: No anginal symptoms, remains to be seen if he had a silent MI recently.  Echocardiogram pending. Will check troponin to look for any high values that could suggest downtrending troponin from possible MI recently. Known prior PTCA to distal LAD. Continue aspirin , statin. Holding metoprolol  due to acute heart failure.  Type 2 diabetes mellitus: Continue Jardiance  25 mg daily. On 32 units insulin  at home.  Will administer 30 units here, along with sliding scale insulin .  Presyncope/fall: I suspect he may have neuropathy leading to some of the recent falls, however, arrhythmia as cause of his presyncope symptoms cannot be excluded.  If hospitalization workup does not show any acute cardiac etiology for his symptoms, he may need outpatient cardiac telemetry after discharge.  Hypertension: Controlled  Parkinson's disease: Continue baseline Parkinson's medications.  Risk Assessment/Risk Scores:      [place]  Heart Association (NYHA) Functional Class NYHA Class III    Code Status: Full Code  Severity of Illness: The appropriate patient status for this patient is INPATIENT. Inpatient status is judged to be reasonable and necessary in order to provide the required intensity of service to ensure the patient's safety. The patient's presenting symptoms, physical exam findings, and initial radiographic and laboratory data in the context of their chronic comorbidities is felt to place them at high risk for further clinical deterioration. Furthermore, it is not anticipated that the patient will be medically stable for discharge from the hospital within 2  midnights of admission.   * I certify that at the point of admission it is my clinical judgment that the patient will require inpatient hospital care spanning beyond 2 midnights from the point of admission due to high intensity of service, high risk for further deterioration and high frequency of surveillance required.*   For questions or updates, please contact [facility] Please consult [email or web address] for contact info under     Signed, Cody Das, MD 06/20/2023 4:38 PM

## 2023-06-21 ENCOUNTER — Inpatient Hospital Stay (HOSPITAL_COMMUNITY)

## 2023-06-21 ENCOUNTER — Other Ambulatory Visit: Payer: Self-pay

## 2023-06-21 ENCOUNTER — Encounter (HOSPITAL_COMMUNITY): Payer: Self-pay | Admitting: Cardiology

## 2023-06-21 DIAGNOSIS — I5033 Acute on chronic diastolic (congestive) heart failure: Secondary | ICD-10-CM

## 2023-06-21 LAB — HEMOGLOBIN A1C
Hgb A1c MFr Bld: 6.2 % — ABNORMAL HIGH (ref 4.8–5.6)
Mean Plasma Glucose: 131.24 mg/dL

## 2023-06-21 LAB — ECHOCARDIOGRAM COMPLETE
AR max vel: 1.94 cm2
AV Area VTI: 1.99 cm2
AV Area mean vel: 2.36 cm2
AV Mean grad: 7.3 mmHg
AV Peak grad: 19.1 mmHg
Ao pk vel: 2.19 m/s
Area-P 1/2: 3.37 cm2
Est EF: >75
Height: 70.5 in
S' Lateral: 2.3 cm
Weight: 3506.2 [oz_av]

## 2023-06-21 LAB — BASIC METABOLIC PANEL WITH GFR
Anion gap: 10 (ref 5–15)
BUN: 11 mg/dL (ref 8–23)
CO2: 30 mmol/L (ref 22–32)
Calcium: 9.4 mg/dL (ref 8.9–10.3)
Chloride: 97 mmol/L — ABNORMAL LOW (ref 98–111)
Creatinine, Ser: 1.2 mg/dL (ref 0.61–1.24)
GFR, Estimated: >60 mL/min (ref >60)
Glucose, Bld: 69 mg/dL — ABNORMAL LOW (ref 70–99)
Potassium: 3.2 mmol/L — ABNORMAL LOW (ref 3.5–5.1)
Sodium: 137 mmol/L (ref 135–145)

## 2023-06-21 LAB — GLUCOSE, CAPILLARY
Glucose-Capillary: 90 mg/dL (ref 70–99)
Glucose-Capillary: 103 mg/dL — ABNORMAL HIGH (ref 70–99)

## 2023-06-21 MED ORDER — INSULIN ASPART 100 UNIT/ML IJ SOLN
0.0000 [IU] | Freq: Three times a day (TID) | INTRAMUSCULAR | Status: DC
  Administered 2023-06-22 – 2023-06-23 (×2): 2 [IU] via SUBCUTANEOUS
  Administered 2023-06-24: 3 [IU] via SUBCUTANEOUS

## 2023-06-21 MED ORDER — PERFLUTREN LIPID MICROSPHERE
1.0000 mL | INTRAVENOUS | Status: AC | PRN
  Administered 2023-06-21: 3 mL via INTRAVENOUS

## 2023-06-21 MED ORDER — LOSARTAN POTASSIUM 25 MG PO TABS
25.0000 mg | ORAL_TABLET | Freq: Every day | ORAL | Status: DC
  Administered 2023-06-22 – 2023-06-24 (×3): 25 mg via ORAL
  Filled 2023-06-21 (×3): qty 1

## 2023-06-21 MED ORDER — POTASSIUM CHLORIDE CRYS ER 20 MEQ PO TBCR
40.0000 meq | EXTENDED_RELEASE_TABLET | Freq: Once | ORAL | Status: AC
  Administered 2023-06-21: 40 meq via ORAL
  Filled 2023-06-21: qty 2

## 2023-06-21 NOTE — Progress Notes (Signed)
 Rounding Note   Patient Name: RUSHIL KIMBRELL Date of Encounter: 06/21/2023  [facility] Cardiologist: Cody Das, MD   Subjective No complaints  Scheduled Meds:  allopurinol   300 mg Oral Daily   amLODipine   5 mg Oral Daily   aspirin  EC  81 mg Oral Daily   busPIRone   5 mg Oral BID   divalproex   500 mg Oral QHS   DULoxetine   30 mg Oral Daily   empagliflozin   25 mg Oral Daily   furosemide   60 mg Intravenous BID   gabapentin   600 mg Oral TID   heparin   5,000 Units Subcutaneous Q8H   insulin  glargine-yfgn  30 Units Subcutaneous QHS   isosorbide  mononitrate  60 mg Oral Daily   levothyroxine   25 mcg Oral QAC breakfast   losartan   50 mg Oral Daily   mirabegron  ER  50 mg Oral Daily   polyvinyl alcohol   1 drop Both Eyes QID   potassium chloride   40 mEq Oral Once   pyridOXINE  100 mg Oral Daily   rosuvastatin   20 mg Oral Daily   sodium chloride  flush  3 mL Intravenous Q12H   tamsulosin   0.4 mg Oral QPM   traZODone   50 mg Oral QHS   Continuous Infusions:  sodium chloride      PRN Meds: sodium chloride , acetaminophen , famotidine , fluticasone , methocarbamol , ondansetron  (ZOFRAN ) IV, sodium chloride  flush   Vital Signs  Vitals:   06/20/23 2308 06/21/23 0454 06/21/23 0809 06/21/23 1214  BP: 120/77 117/74 119/72 99/65  Pulse: 68 62 61 66  Resp: 16 19 15 18   Temp: 98.1 F (36.7 C) 97.6 F (36.4 C) 98 F (36.7 C) 97.7 F (36.5 C)  TempSrc: Oral Oral Oral Oral  SpO2: 94% 93% 94% 90%  Weight:  99.4 kg    Height:        Intake/Output Summary (Last 24 hours) at 06/21/2023 1254 Last data filed at 06/21/2023 0526 Gross per 24 hour  Intake 600 ml  Output 2500 ml  Net -1900 ml      06/21/2023    4:54 AM 06/20/2023    5:11 PM 06/20/2023    3:55 PM  Last 3 Weights  Weight (lbs) 219 lb 2.2 oz 229 lb 231 lb 9.6 oz  Weight (kg) 99.4 kg 103.874 kg 105.053 kg      Telemetry NSR - Personally Reviewed  ECG  N/a - Personally Reviewed  Physical Exam  GEN:  No acute distress.   Neck: + JVD Cardiac: RRR, 2/6 systolic murmur rusb Respiratory: Clear to auscultation bilaterally. GI: Soft, nontender, non-distended  MS: 1+ bilateral LE edema Neuro:  Nonfocal  Psych: Normal affect   Labs High Sensitivity Troponin:   Recent Labs  Lab 06/20/23 1736 06/20/23 1952  TROPONINIHS 6 7     Chemistry Recent Labs  Lab 06/20/23 1736 06/21/23 0757  NA 138 137  K 4.2 3.2*  CL 97* 97*  CO2 30 30  GLUCOSE 79 69*  BUN 9 11  CREATININE 1.19 1.20  CALCIUM  9.7 9.4  PROT 7.4  --   ALBUMIN 3.7  --   AST 31  --   ALT 25  --   ALKPHOS 72  --   BILITOT 0.8  --   GFRNONAA >60 >60  ANIONGAP 11 10    Lipids No results for input(s): "CHOL", "TRIG", "HDL", "LABVLDL", "LDLCALC", "CHOLHDL" in the last 168 hours.  Hematology Recent Labs  Lab 06/20/23 1632  WBC 8.0  RBC 4.70  HGB 13.3  HCT 40.6  MCV 86.4  MCH 28.3  MCHC 32.8  RDW 16.6*  PLT 262   Thyroid  No results for input(s): "TSH", "FREET4" in the last 168 hours.  BNP Recent Labs  Lab 06/20/23 1736  BNP 291.2*    DDimer No results for input(s): "DDIMER" in the last 168 hours.   Radiology  DG Chest 2 View Result Date: 06/21/2023 CLINICAL DATA:  Exertional dyspnea. EXAM: CHEST - 2 VIEW COMPARISON:  11/10/2009 FINDINGS: Low lung volumes. The cardio pericardial silhouette is enlarged. Right lung clear. There is some trace atelectasis or infiltrate at the left base with tiny left pleural effusion. No acute bony abnormality. Telemetry leads overlie the chest. IMPRESSION: Low lung volumes with trace atelectasis or infiltrate at the left base and tiny left pleural effusion. Electronically Signed   By: Donnal Fusi M.D.   On: 06/21/2023 07:03     Patient Profile  Mr. Hart is a 78 year old male with hypertension, hyperlipidemia, CAD, type 2 DM, mild AS, OSA on CPAP, hypothyroidism, Parkinson's disease, GERD admitted with SOB and leg edema.   Assessment & Plan   1.Acute on chronic HFpEF -  seen in cards clinic on day of admit with 7-10 days of rapidly increasing LE edema and orthopena - 02/2023 echo: LVEF 60-65%, no WMAs, grade I dd, normal RV functionmild AS mean grad 8 AVA VTI 1.97 - repeat echo pending - CXR trace left pleural effusion, BNP 291  - on IV lasix  60mg  bid, early I/Os data shows negative 1.9 L thus far. Renal function is stable   2. LE edema - HF management as reported above - LE venous US  also ordered on admission   3.History of CAD - prior PTCA to distal LAD 08/2021 - 12/2021 nuclear stress: no ischemia - trops negative, EKG inferior TWis and inferior small Qwaves.  - Echo pending    4. DM2 - continue home regime, SSI and accuchecks.    5. Presyncope - f/u echo, telemetry during admission - possible outpatient monitor - history of Parkinsons, orthostatics were negative 06/17/23.    6. HTN - soft bp's this AM, hold norvasc  and lower losartan  to 25mg  daily.    For questions or updates, please contact [facility] Please consult [email or web address] for contact info under     Signed, Armida Lander, MD  06/21/2023, 12:54 PM

## 2023-06-22 DIAGNOSIS — I5033 Acute on chronic diastolic (congestive) heart failure: Secondary | ICD-10-CM | POA: Diagnosis not present

## 2023-06-22 LAB — BASIC METABOLIC PANEL WITH GFR
Anion gap: 12 (ref 5–15)
BUN: 19 mg/dL (ref 8–23)
CO2: 28 mmol/L (ref 22–32)
Calcium: 9.3 mg/dL (ref 8.9–10.3)
Chloride: 96 mmol/L — ABNORMAL LOW (ref 98–111)
Creatinine, Ser: 1.39 mg/dL — ABNORMAL HIGH (ref 0.61–1.24)
GFR, Estimated: 52 mL/min — ABNORMAL LOW (ref >60)
Glucose, Bld: 97 mg/dL (ref 70–99)
Potassium: 3.7 mmol/L (ref 3.5–5.1)
Sodium: 136 mmol/L (ref 135–145)

## 2023-06-22 LAB — GLUCOSE, CAPILLARY
Glucose-Capillary: 85 mg/dL (ref 70–99)
Glucose-Capillary: 125 mg/dL — ABNORMAL HIGH (ref 70–99)
Glucose-Capillary: 140 mg/dL — ABNORMAL HIGH (ref 70–99)
Glucose-Capillary: 136 mg/dL — ABNORMAL HIGH (ref 70–99)

## 2023-06-22 LAB — MAGNESIUM: Magnesium: 1.8 mg/dL (ref 1.7–2.4)

## 2023-06-22 MED ORDER — EMPAGLIFLOZIN 25 MG PO TABS
25.0000 mg | ORAL_TABLET | Freq: Every day | ORAL | Status: DC
  Administered 2023-06-22 – 2023-06-24 (×3): 25 mg via ORAL
  Filled 2023-06-22 (×3): qty 1

## 2023-06-22 NOTE — Progress Notes (Signed)
 Rounding Note   Patient Name: Anthony [name] Date of Encounter: 06/22/2023  [facility] Cardiologist: Cody Das, MD   Subjective SOB is improving  Scheduled Meds:  allopurinol   300 mg Oral Daily   aspirin  EC  81 mg Oral Daily   busPIRone   5 mg Oral BID   divalproex   500 mg Oral QHS   DULoxetine   30 mg Oral Daily   furosemide   60 mg Intravenous BID   gabapentin   600 mg Oral TID   heparin   5,000 Units Subcutaneous Q8H   insulin  aspart  0-15 Units Subcutaneous TID WC   insulin  glargine-yfgn  30 Units Subcutaneous QHS   isosorbide  mononitrate  60 mg Oral Daily   levothyroxine   25 mcg Oral QAC breakfast   losartan   25 mg Oral Daily   mirabegron  ER  50 mg Oral Daily   polyvinyl alcohol   1 drop Both Eyes QID   pyridOXINE  100 mg Oral Daily   rosuvastatin   20 mg Oral Daily   sodium chloride  flush  3 mL Intravenous Q12H   tamsulosin   0.4 mg Oral QPM   traZODone   50 mg Oral QHS   Continuous Infusions:  PRN Meds: acetaminophen , famotidine , fluticasone , methocarbamol , ondansetron  (ZOFRAN ) IV, sodium chloride  flush   Vital Signs  Vitals:   06/21/23 2011 06/21/23 2348 06/22/23 0442 06/22/23 0831  BP: 112/64 116/70 101/64 112/70  Pulse: 71 74 73   Resp: 18 19 16 20   Temp: 98.2 F (36.8 C) 98 F (36.7 C) 97.6 F (36.4 C) 98.5 F (36.9 C)  TempSrc: Oral Oral Oral Oral  SpO2: 93% 91% 91% 93%  Weight:   90.6 kg   Height:        Intake/Output Summary (Last 24 hours) at 06/22/2023 1115 Last data filed at 06/22/2023 0442 Gross per 24 hour  Intake 2140 ml  Output 2700 ml  Net -560 ml      06/22/2023    4:42 AM 06/21/2023    4:54 AM 06/20/2023    5:11 PM  Last 3 Weights  Weight (lbs) 199 lb 11.2 oz 219 lb 2.2 oz 229 lb  Weight (kg) 90.583 kg 99.4 kg 103.874 kg      Telemetry NSR - Personally Reviewed  ECG  N/a - Personally Reviewed  Physical Exam  GEN: No acute distress.   Neck: No JVD Cardiac: RRR, no murmurs, rubs, or gallops.  Respiratory:  Clear to auscultation bilaterally. GI: Soft, nontender, non-distended  MS: 1+ bilateral LE edema Neuro:  Nonfocal  Psych: Normal affect   Labs High Sensitivity Troponin:   Recent Labs  Lab 06/20/23 1736 06/20/23 1952  TROPONINIHS 6 7     Chemistry Recent Labs  Lab 06/20/23 1736 06/21/23 0757 06/22/23 0450  NA 138 137 136  K 4.2 3.2* 3.7  CL 97* 97* 96*  CO2 30 30 28   GLUCOSE 79 69* 97  BUN 9 11 19   CREATININE 1.19 1.20 1.39*  CALCIUM  9.7 9.4 9.3  MG  --   --  1.8  PROT 7.4  --   --   ALBUMIN 3.7  --   --   AST 31  --   --   ALT 25  --   --   ALKPHOS 72  --   --   BILITOT 0.8  --   --   GFRNONAA >60 >60 52*  ANIONGAP 11 10 12     Lipids No results for input(s): "CHOL", "TRIG", "HDL", "LABVLDL", "LDLCALC", "CHOLHDL"  in the last 168 hours.  Hematology Recent Labs  Lab 06/20/23 1632  WBC 8.0  RBC 4.70  HGB 13.3  HCT 40.6  MCV 86.4  MCH 28.3  MCHC 32.8  RDW 16.6*  PLT 262   Thyroid  No results for input(s): "TSH", "FREET4" in the last 168 hours.  BNP Recent Labs  Lab 06/20/23 1736  BNP 291.2*    DDimer No results for input(s): "DDIMER" in the last 168 hours.   Radiology  ECHOCARDIOGRAM COMPLETE Result Date: 06/21/2023    ECHOCARDIOGRAM REPORT   Patient Name:   Anthony [name] Date of Exam: 06/21/2023 Medical Rec #:  [id number]       Height:       70.5 in Accession #:    [id number]      Weight:       219.1 lb Date of Birth:  [birth year]      BSA:          2.181 m Patient Age:    77 years        BP:           99/65 mmHg Patient Gender: M               HR:           66 bpm. Exam Location:  Inpatient Procedure: 2D Echo, Cardiac Doppler, Color Doppler and Intracardiac            Opacification Agent (Both Spectral and Color Flow Doppler were            utilized during procedure). Indications:    Congestive Heart Failure I50.9  History:        Patient has prior history of Echocardiogram examinations, most                 recent 02/26/2023. Risk Factors:Hypertension,  Diabetes,                 Dyslipidemia, Sleep Apnea and Obesity.  Sonographer:    Denese Finn RCS Referring Phys: [id number] [facility] J PATWARDHAN  Sonographer Comments: Image acquisition challenging due to patient body habitus. IMPRESSIONS  1. Left ventricular ejection fraction, by estimation, is >75%. The left ventricle has hyperdynamic function. The left ventricle has no regional wall motion abnormalities. There is mild left ventricular hypertrophy. Left ventricular diastolic parameters were normal.  2. Right ventricular systolic function is mildly reduced. The right ventricular size is normal. Tricuspid regurgitation signal is inadequate for assessing PA pressure.  3. The mitral valve is grossly normal. No evidence of mitral valve regurgitation. No evidence of mitral stenosis.  4. The aortic valve was not well visualized. Unable to determine aortic valve morphology due to image quality. Aortic valve regurgitation is not visualized. Visually aortic valve is calcified, reduced leaflet excursion. Visually appears more stenotic but hemodynamically mild aortic stenosis (at RSB peak velocity 2.32m/s, MG , AVA VTI 1.46cm2).  5. The inferior vena cava is dilated in size with <50% respiratory variability, suggesting right atrial pressure of 15 mmHg. Comparison(s): A prior study was performed on 02/26/2023. Reported LVEF 60 to 65%, grade 1 diastolic dysfunction, average GLS -26.6%, mild aortic stenosis, estimated RAP 15 mmHg. FINDINGS  Left Ventricle: Left ventricular ejection fraction, by estimation, is >75%. The left ventricle has hyperdynamic function. The left ventricle has no regional wall motion abnormalities. Definity contrast agent was given IV to delineate the left ventricular endocardial borders. The left ventricular internal cavity size was normal in size. There is mild left  ventricular hypertrophy. Left ventricular diastolic parameters were normal. Right Ventricle: The right ventricular size is normal. Right  vetricular wall thickness was not well visualized. Right ventricular systolic function is mildly reduced. Tricuspid regurgitation signal is inadequate for assessing PA pressure. Left Atrium: Left atrial size was normal in size. Right Atrium: Right atrial size was normal in size. Pericardium: There is no evidence of pericardial effusion. Mitral Valve: The mitral valve is grossly normal. No evidence of mitral valve regurgitation. No evidence of mitral valve stenosis. Tricuspid Valve: The tricuspid valve is grossly normal. Tricuspid valve regurgitation is not demonstrated. No evidence of tricuspid stenosis. Aortic Valve: The aortic valve was not well visualized. Aortic valve regurgitation is not visualized. Visually aortic valve is calcified, reduced leaflet excursion. Visually appears more stenotic but hemodynamically mild aortic stenosis (at RSB peak velocity 2.98m/s, MG , AVA VTI 1.46cm2). Aortic valve mean gradient measures 7.3 mmHg. Aortic valve peak gradient measures 19.1 mmHg. Aortic valve area, by VTI measures 1.99 cm. Pulmonic Valve: The pulmonic valve was not well visualized. Pulmonic valve regurgitation is not visualized. No evidence of pulmonic stenosis. Aorta: The aortic root is normal in size and structure and the ascending aorta was not well visualized. Venous: The inferior vena cava is dilated in size with less than 50% respiratory variability, suggesting right atrial pressure of 15 mmHg. IAS/Shunts: The atrial septum is grossly normal.  LEFT VENTRICLE PLAX 2D LVIDd:         4.40 cm   Diastology LVIDs:         2.30 cm   LV e' medial:    6.20 cm/s LV PW:         1.10 cm   LV E/e' medial:  12.4 LV IVS:        1.30 cm   LV e' lateral:   8.27 cm/s LVOT diam:     2.10 cm   LV E/e' lateral: 9.3 LV SV:         85 LV SV Index:   39 LVOT Area:     3.46 cm  RIGHT VENTRICLE RV S prime:     9.57 cm/s TAPSE (M-mode): 1.5 cm LEFT ATRIUM             Index        RIGHT ATRIUM           Index LA diam:        3.10  cm 1.42 cm/m   RA Area:     16.40 cm LA Vol (A2C):   75.9 ml 34.79 ml/m  RA Volume:   46.30 ml  21.22 ml/m LA Vol (A4C):   57.3 ml 26.27 ml/m LA Biplane Vol: 71.3 ml 32.68 ml/m  AORTIC VALVE AV Area (Vmax):    1.94 cm AV Area (Vmean):   2.36 cm AV Area (VTI):     1.99 cm AV Vmax:           218.67 cm/s AV Vmean:          116.967 cm/s AV VTI:            0.426 m AV Peak Grad:      19.1 mmHg AV Mean Grad:      7.3 mmHg LVOT Vmax:         122.50 cm/s LVOT Vmean:        79.800 cm/s LVOT VTI:          0.245 m LVOT/AV VTI ratio: 0.57  AORTA Ao Root diam: 3.50  cm MITRAL VALVE MV Area (PHT): 3.37 cm    SHUNTS MV Decel Time: 225 msec    Systemic VTI:  0.24 m MV E velocity: 76.70 cm/s  Systemic Diam: 2.10 cm MV A velocity: 99.00 cm/s MV E/A ratio:  0.77 Sunit Tolia Electronically signed by Olinda Bertrand Signature Date/Time: 06/21/2023/4:50:43 PM    Final    DG Chest 2 View Result Date: 06/21/2023 CLINICAL DATA:  Exertional dyspnea. EXAM: CHEST - 2 VIEW COMPARISON:  11/10/2009 FINDINGS: Low lung volumes. The cardio pericardial silhouette is enlarged. Right lung clear. There is some trace atelectasis or infiltrate at the left base with tiny left pleural effusion. No acute bony abnormality. Telemetry leads overlie the chest. IMPRESSION: Low lung volumes with trace atelectasis or infiltrate at the left base and tiny left pleural effusion. Electronically Signed   By: Donnal Fusi M.D.   On: 06/21/2023 07:03      Patient Profile   Mr. Reason is a 78 year old male with hypertension, hyperlipidemia, CAD, type 2 DM, mild AS, OSA on CPAP, hypothyroidism, Parkinson's disease, GERD admitted with SOB and leg edema.   Assessment & Plan   1.Acute on chronic HFpEF - seen in cards clinic on day of admit with 7-10 days of rapidly increasing LE edema and orthopena - 02/2023 echo: LVEF 60-65%, no WMAs, grade I dd, normal RV functionmild AS mean grad 8 AVA VTI 1.97 - 05/2023 echo: LVEF >75%, no WMAs, mild RV dysfunction,  normal RV size, TR inadequate for PASP,  - CXR trace left pleural effusion, BNP 291   - on IV lasix  60mg  bid, negative yesterday and 2.4 L since admission. Mild uptrend in Cr - edema much improved but not resolved, continue IV lasix  today (put stop date after tonights dose), reassess in AM. Possible transition to oral tomorrow and possible d/c   2. LE edema - HF management as reported above - LE venous US  also ordered on admission and are pending     3.History of CAD - prior PTCA to distal LAD 08/2021 - 12/2021 nuclear stress: no ischemia - trops negative, EKG inferior TWis and inferior small Qwaves.  - - 05/2023 echo: LVEF >75%, no WMAs, mild RV dysfunction, normal RV size, TR inadequate for PASP,       4. DM2 - continue home regime, SSI and accuchecks.      5. Presyncope - f/u echo, telemetry during admission - possible outpatient monitor - history of Parkinsons, orthostatics were negative 06/17/23.     6. HTN - soft bp's this AM, held norvasc  and lowered losartan  to 25mg  daily.    7. Aortic stenosis - probably mild AS. Limited visualization by echo. By Pedhoff mean grad 9, AVA VTI with Pedhoff numbers 1.5.      For questions or updates, please contact [facility] Please consult [email or web address] for contact info under     Signed, Armida Lander, MD  06/22/2023, 11:15 AM

## 2023-06-23 ENCOUNTER — Encounter (HOSPITAL_COMMUNITY)

## 2023-06-23 ENCOUNTER — Other Ambulatory Visit: Payer: Self-pay | Admitting: Adult Health

## 2023-06-23 DIAGNOSIS — I50811 Acute right heart failure: Secondary | ICD-10-CM

## 2023-06-23 LAB — GLUCOSE, CAPILLARY
Glucose-Capillary: 130 mg/dL — ABNORMAL HIGH (ref 70–99)
Glucose-Capillary: 107 mg/dL — ABNORMAL HIGH (ref 70–99)
Glucose-Capillary: 104 mg/dL — ABNORMAL HIGH (ref 70–99)
Glucose-Capillary: 135 mg/dL — ABNORMAL HIGH (ref 70–99)

## 2023-06-23 LAB — BASIC METABOLIC PANEL WITH GFR
Anion gap: 14 (ref 5–15)
BUN: 21 mg/dL (ref 8–23)
CO2: 27 mmol/L (ref 22–32)
Calcium: 9.1 mg/dL (ref 8.9–10.3)
Chloride: 95 mmol/L — ABNORMAL LOW (ref 98–111)
Creatinine, Ser: 1.27 mg/dL — ABNORMAL HIGH (ref 0.61–1.24)
GFR, Estimated: 58 mL/min — ABNORMAL LOW (ref >60)
Glucose, Bld: 92 mg/dL (ref 70–99)
Potassium: 4 mmol/L (ref 3.5–5.1)
Sodium: 136 mmol/L (ref 135–145)

## 2023-06-23 LAB — MAGNESIUM: Magnesium: 2 mg/dL (ref 1.7–2.4)

## 2023-06-23 MED ORDER — ALUM & MAG HYDROXIDE-SIMETH 200-200-20 MG/5ML PO SUSP: 30.0000 mL | ORAL | Status: DC | PRN

## 2023-06-23 MED ORDER — ISOSORBIDE MONONITRATE ER 30 MG PO TB24
30.0000 mg | ORAL_TABLET | Freq: Every day | ORAL | Status: DC
  Administered 2023-06-24: 30 mg via ORAL
  Filled 2023-06-23: qty 1

## 2023-06-23 MED ORDER — FUROSEMIDE 40 MG PO TABS
80.0000 mg | ORAL_TABLET | Freq: Two times a day (BID) | ORAL | Status: DC
  Administered 2023-06-23 – 2023-06-24 (×3): 80 mg via ORAL
  Filled 2023-06-23 (×3): qty 2

## 2023-06-23 NOTE — Progress Notes (Signed)
 Mobility Specialist Progress Note;   06/23/23 1037  Mobility  Activity Ambulated independently in hallway  Level of Assistance Standby assist, set-up cues, supervision of patient - no hands on  Assistive Device None  Distance Ambulated (ft) 300 ft  Activity Response Tolerated well  Mobility Referral Yes  Mobility visit 1 Mobility  Mobility Specialist Start Time (ACUTE ONLY) 1037  Mobility Specialist Stop Time (ACUTE ONLY) 1057  Mobility Specialist Time Calculation (min) (ACUTE ONLY) 20 min    Pre-mobility: SPO2 95% RA During-mobility: SPO2 90%> RA  Pt eager for mobility. RN in room giving meds. Required no physical assistance during ambulation, SV. Ambulated on RA, VSS throughout. No c/o when asked. Pt returned back to bed with all needs met, call bell in reach. Wife present.    Anthony [name] Mobility Specialist Please contact via SecureChat or [employer] [phone]

## 2023-06-23 NOTE — Progress Notes (Signed)
  Progress Note  Patient Name: Anthony [name] Date of Encounter: 06/23/2023 [facility] Cardiologist: Cody Das, MD   Interval Summary   Patient reports feeling better in terms of shortness of breath Still requiring supplemental oxygen  Given IV Lasix  with good urine output, creatinine trending down   Vital Signs Vitals:   06/22/23 1647 06/22/23 2000 06/22/23 2331 06/23/23 0518  BP: 101/64 98/69 108/63 102/60  Pulse:  87 85 87  Resp: 16 17 16 18   Temp: 98.2 F (36.8 C) 98.4 F (36.9 C) 98 F (36.7 C) 97.9 F (36.6 C)  TempSrc: Oral Oral Oral Oral  SpO2:  93% 94% 93%  Weight:    97.4 kg  Height:        Intake/Output Summary (Last 24 hours) at 06/23/2023 0831 Last data filed at 06/23/2023 0500 Gross per 24 hour  Intake 960 ml  Output 2460 ml  Net -1500 ml      06/23/2023    5:18 AM 06/22/2023    4:42 AM 06/21/2023    4:54 AM  Last 3 Weights  Weight (lbs) 214 lb 12.8 oz 199 lb 11.2 oz 219 lb 2.2 oz  Weight (kg) 97.433 kg 90.583 kg 99.4 kg     Telemetry/ECG  Sinus rhythm, HR 80s - Personally Reviewed  Physical Exam  GEN: No acute distress.   Neck: No JVD Cardiac: RRR, SEM.  Respiratory: Clear to auscultation bilaterally. GI: Soft, nontender, non-distended  MS: No edema  Assessment & Plan   Acute on chronic HFpEF Hypertension  LE edema Sent to hospital directly from outpatient appointment with Dr. Filiberto Hug  Presented with worsening LE edema, orthopnea  BNP 291 CXR showed tiny left pleural effusion  Echo showed EF > 75%, mild LVH, reduced RV function, dilated IVC Given IV Lasix  60 mg x 5 doses  Net output nearly - 4 L  Most recent BP 102/60, HR 87 Creatinine trending down, 1.27 today  Home meds: PO Lasix  40 mg BID, amlodipine  5 mg daily, Jardiance  25 mg daily, Imdur  60 mg daily, lisinopril  40 mg daily, Toprol  100 mg daily LE edema better today, patient reports improvement in shortness of breath however he has still been requiring  supplemental oxygen most of the time  Continue Jardiance  25 mg daily Continue Imdur  60 mg daily Continue losartan  25 mg daily Transition to PO Lasix  80 mg BID today   CAD s/p PTCA to distal LAD 2023 Hyperlipidemia  Nuclear stress in 12/2021 showed no ischemia  Troponin negative x 2 Continue ASA 81 mg daily Continue Crestor  20 mg daily   Type 2 diabetes Continue Jardiance  25 mg daily Continue SSI while inpatient   Presyncope  Patient reported history of presyncope, recent falls Baseline neuropathy and Parkinson's could be contributing  Negative orthostatic vital signs  Consider cardiac telemetry at discharge   Mild aortic stenosis  Echo showed mild aortic stenosis, RSB peak velocity 2.6 m/s, MG 9 mmHg, AVA VTI 1.46 cm2   Parkinson's disease Continue home medications   Hypothyroidism Continue levothyroxine     For questions or updates, please contact [facility] Please consult [email or web address] for contact info under       Signed, Jiles Mote, PA-C

## 2023-06-23 NOTE — TOC Initial Note (Addendum)
 Transition of Care [facility]) - Initial/Assessment Note    Patient Details  Name: Anthony [name] MRN: [id number] Date of Birth: 05/07/1945  Transition of Care [facility]) CM/SW Contact:    Cosimo Diones, RN Phone Number: 06/23/2023, 12:49 PM  Clinical Narrative: Patient presented for shortness of breath. PTA patient was from home with spouse. Spouse was at the bedside during the visit. Case Manager discussed disposition plan with the PA and the patient will return home once stable and finish outpatient PT with [facility]. Ambulatory referral submitted to the office. Office to call the patient with visit times. Patient has DME cane and CPAP via the [facility]. No further needs identified at this time. Case Manager will continue to follow for additional needs.                   Expected Discharge Plan: OP Rehab Barriers to Discharge: Continued Medical Work up   Patient Goals and CMS Choice Patient states their goals for this hospitalization and ongoing recovery are:: plan to return home with spouse.   Choice offered to / list presented to : Parent      Expected Discharge Plan and Services In-house Referral: NA Discharge Planning Services: CM Consult Post Acute Care Choice:  (OP rehab) Living arrangements for the past 2 months: Single Family Home                   DME Agency: NA                  Prior Living Arrangements/Services Living arrangements for the past 2 months: Single Family Home Lives with:: Spouse Patient language and need for interpreter reviewed:: Yes Do you feel safe going back to the place where you live?: Yes      Need for Family Participation in Patient Care: Yes (Comment) Care giver support system in place?: Yes (comment) Current home services: DME (cane) Criminal Activity/Legal Involvement Pertinent to Current Situation/Hospitalization: No - Comment as needed  Activities of Daily Living   ADL Screening (condition at time of  admission) Independently performs ADLs?: Yes (appropriate for developmental age) Is the patient deaf or have difficulty hearing?: Yes Does the patient have difficulty seeing, even when wearing glasses/contacts?: No Does the patient have difficulty concentrating, remembering, or making decisions?: No  Permission Sought/Granted Permission sought to share information with : Family Supports, Magazine features editor, Case Automotive engineer granted to share info w AGENCY: [facility]        Emotional Assessment Appearance:: Appears stated age Attitude/Demeanor/Rapport: Engaged Affect (typically observed): Appropriate Orientation: : Oriented to Self, Oriented to Place, Oriented to  Time, Oriented to Situation Alcohol  / Substance Use: Not Applicable Psych Involvement: No (comment)  Admission diagnosis:  Heart failure (HCC) [I50.9] Patient Active Problem List   Diagnosis Date Noted   Heart failure (HCC) 06/20/2023   Acute right-sided congestive heart failure (HCC) 06/20/2023   Shortness of breath 06/20/2023   Nonrheumatic aortic valve stenosis 02/07/2023   Hypersomnia, persistent 12/26/2022   Nasal congestion 12/26/2022   Post traumatic stress disorder (PTSD) 12/26/2022   S/P cervical spinal fusion 08/02/2022   Parkinsonism (HCC) 12/06/2021   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/06/2021   Hyperkalemia 12/06/2020   Coronary artery disease of native artery of native heart with stable angina pectoris (HCC) 12/03/2019   Esophageal dysphagia 11/21/2019   Gastroesophageal reflux disease 11/21/2019   Family history of colon cancer 11/21/2019  Abnormal stress test 05/11/2019   Mixed hyperlipidemia 05/11/2019   Angina pectoris (HCC) 08/07/2018   Essential hypertension 08/07/2018   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, with long-term current use of insulin  (HCC) 08/07/2018   Morbid obesity (HCC) 08/28/2017    Neuropathy 08/28/2017   Numbness and tingling of both legs below knees 11/10/2013   OSA on CPAP 05/14/2013   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   Obesity (BMI 30.0-34.9) 05/14/2013   PCP:  Neomia Banner, MD Pharmacy:   [facility] [phone] Jonette Nestle, [place] - [address] AT [facility] [place] [address] [place] [address] Phone: [phone] Fax: [phone]  [facility] - [place], [place] - [address] [place] [address] Phone: [phone] Fax: [phone]  [facility] [address] [place] [address] Phone: [phone] Fax: [phone]     Social Drivers of Health (SDOH) Social History: SDOH Screenings   Food Insecurity: No Food Insecurity (06/20/2023)  Housing: Low Risk  (06/20/2023)  Transportation Needs: No Transportation Needs (06/20/2023)  Utilities: Not At Risk (06/20/2023)  Depression (PHQ2-9): Low Risk  (09/08/2019)  Social Connections: Moderately Integrated (06/20/2023)  Tobacco Use: Low Risk  (06/21/2023)   SDOH Interventions:     Readmission Risk Interventions     No data to display

## 2023-06-23 NOTE — Progress Notes (Signed)
   06/23/23 2323  BiPAP/CPAP/SIPAP  $ Non-Invasive Home Ventilator  Initial  $ Face Mask Large  Yes  BiPAP/CPAP/SIPAP Pt Type Adult  BiPAP/CPAP/SIPAP Resmed  Mask Type Full face mask  Mask Size Large  Respiratory Rate 16 breaths/min  EPAP 11 cmH2O  FiO2 (%) 21 %  Patient Home Machine No  Patient Home Mask No  Patient Home Tubing No  Auto Titrate No  Device Plugged into RED Power Outlet Yes  BiPAP/CPAP /SiPAP Vitals  Resp 16  Bilateral Breath Sounds Diminished  MEWS Score/Color  MEWS Score 1  MEWS Score Color Green

## 2023-06-24 ENCOUNTER — Other Ambulatory Visit (HOSPITAL_COMMUNITY): Payer: Self-pay

## 2023-06-24 ENCOUNTER — Other Ambulatory Visit: Payer: Self-pay | Admitting: Adult Health

## 2023-06-24 DIAGNOSIS — E039 Hypothyroidism, unspecified: Secondary | ICD-10-CM | POA: Insufficient documentation

## 2023-06-24 DIAGNOSIS — R1310 Dysphagia, unspecified: Secondary | ICD-10-CM

## 2023-06-24 DIAGNOSIS — I5033 Acute on chronic diastolic (congestive) heart failure: Secondary | ICD-10-CM

## 2023-06-24 DIAGNOSIS — G20A1 Parkinson's disease without dyskinesia, without mention of fluctuations: Secondary | ICD-10-CM

## 2023-06-24 DIAGNOSIS — I350 Nonrheumatic aortic (valve) stenosis: Secondary | ICD-10-CM

## 2023-06-24 DIAGNOSIS — I5030 Unspecified diastolic (congestive) heart failure: Secondary | ICD-10-CM

## 2023-06-24 LAB — GLUCOSE, CAPILLARY
Glucose-Capillary: 88 mg/dL (ref 70–99)
Glucose-Capillary: 179 mg/dL — ABNORMAL HIGH (ref 70–99)
Glucose-Capillary: 81 mg/dL (ref 70–99)

## 2023-06-24 LAB — BASIC METABOLIC PANEL WITH GFR
Anion gap: 9 (ref 5–15)
BUN: 17 mg/dL (ref 8–23)
CO2: 30 mmol/L (ref 22–32)
Calcium: 8.9 mg/dL (ref 8.9–10.3)
Chloride: 98 mmol/L (ref 98–111)
Creatinine, Ser: 1.05 mg/dL (ref 0.61–1.24)
GFR, Estimated: >60 mL/min (ref >60)
Glucose, Bld: 90 mg/dL (ref 70–99)
Potassium: 3.6 mmol/L (ref 3.5–5.1)
Sodium: 137 mmol/L (ref 135–145)

## 2023-06-24 MED ORDER — FUROSEMIDE 80 MG PO TABS
80.0000 mg | ORAL_TABLET | Freq: Two times a day (BID) | ORAL | 0 refills | Status: DC
  Filled 2023-06-24: qty 30, 15d supply, fill #0

## 2023-06-24 MED ORDER — ISOSORBIDE MONONITRATE ER 30 MG PO TB24
30.0000 mg | ORAL_TABLET | Freq: Every day | ORAL | 4 refills | Status: AC
Start: 2023-06-25 — End: ?
  Filled 2023-06-24: qty 30, 30d supply, fill #0

## 2023-06-24 MED ORDER — LOSARTAN POTASSIUM 25 MG PO TABS
25.0000 mg | ORAL_TABLET | Freq: Every day | ORAL | 3 refills | Status: DC
  Filled 2023-06-24: qty 90, 90d supply, fill #0

## 2023-06-24 NOTE — Discharge Summary (Signed)
 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 The left ventricle has hyperdynamic function. The left ventricle has no regional wall motion abnormalities. There is mild left ventricular hypertrophy. Left ventricular diastolic parameters were normal.  Right ventricular systolic function is mildly reduced. The right ventricular size is normal. Tricuspid regurgitation signal is inadequate for assessing PA pressure.  The mitral valve is grossly normal. No evidence of mitral valve regurgitation. No evidence of mitral stenosis.  The aortic valve was not well visualized. Unable to determine aortic valve morphology due to image quality. Aortic valve regurgitation is not visualized. Visually aortic valve is calcified, reduced leaflet excursion. Visually appears more stenotic but hemodynamically mild aortic stenosis ( at RSB peak velocity 2. 30m/ s, MG , AVA VTI 1. 46cm2) .  The inferior vena cava is dilated in size with < 50% respiratory variability, suggesting right atrial pressure of 15 mmHg.  Comparison( s) : A prior study was performed on 02/ 05/ 2025. Reported LVEF 60 to 65% , grade 1 diastolic dysfunction, average GLS - 26. 6% , mild aortic stenosis, estimated RAP 15 mmHg. _____________   History of Present Illness    Anthony [name] is a 78 y.o. male with hypertension, hyperlipidemia, CAD, type 2 DM, mild AS, OSA on CPAP, hypothyroidism, Parkinson's disease, GERD.  He was sent directly from the office by Dr. Filiberto Hug for shortness of breath, leg edema.  He reported that over the last 7 to 10 days prior to admission he had had progressive bilateral leg edema, orthopnea.  His PCP had started him on Lasix  40 mg daily and he saw some improvement however he had still remained very edematous and had some abdominal bloating.  He was then sent directly to [facility] with our cardiology service.  Hospital Course   He was found to have a BNP elevated at 291, CXR showed tiny left pleural effusion, echo this admission showed EF greater than 75%, mild LVH, reduced RV function, dilated IVC.  He was diuresed on IV Lasix  then transition to PO, he had net output of -5.1 L.  His creatinine was trending down with diuresis.  He was continued on Jardiance  25 mg daily, Imdur  30 mg daily, losartan  25 mg daily (switched from his Entresto due to his reported chronic cough), p.o. Lasix  80 mg twice daily.  Reports history of CAD status post PCI TCA to the distal LAD in 2023 he was continued on his home ASA 81 mg daily, Crestor  20 mg daily.  He had no chest pain and negative troponin x 2 while he was here.  Overall, he diuresed well and was ready for discharge on 06/24/2023.  He had initially had a lower extremity Doppler ultrasound ordered by Dr. Filiberto Hug prior to his visit with him on 06/20/2023 to rule out DVT.  This ultrasound was not done during  his time, we attempted to order it again today however with lack of ultrasound techs it was deemed that they would not be able to do it today prior to discharge.  The patient as well as myself and Dr. Maximo Spar were comfortable letting the patient discharge home today with follow-up lower extremity ultrasound as an outpatient.  His final treatment plan is as below:  Acute on chronic  HFpEF Hypertension  LE edema Continue Jardiance  25 mg daily Continue Imdur  30 mg daily Continue losartan  25 mg daily Continue PO Lasix  80 mg BID    CAD s/p PTCA to distal LAD 2023 Hyperlipidemia  Continue ASA 81 mg daily Continue Crestor  20 mg daily    Type 2 diabetes Continue Jardiance  25 mg daily Resume home regimen at discharge    Presyncope  Thought to be related to low blood pressure Adjusted Imdur  while inpatient Improvement in heart rate, BP, patient no longer symptomatic If he began the symptoms again, would consider cardiac monitor   Mild aortic stenosis  Continue to follow clinically    Parkinson's disease Continue home medications    Hypothyroidism Continue levothyroxine      Did the patient have an acute coronary syndrome (MI, NSTEMI, STEMI, etc) this admission?:  No                               Did the patient have a percutaneous coronary intervention (stent / angioplasty)?:  No.     _____________  Discharge Vitals Blood pressure 109/75, pulse 84, temperature 98.1 F (36.7 C), temperature source Oral, resp. rate 18, height 5' 10.5" (1.791 m), weight 96.1 kg, SpO2 92%.  Filed Weights   06/22/23 0442 06/23/23 0518 06/24/23 0621  Weight: 90.6 kg 97.4 kg 96.1 kg    Labs & Radiologic Studies  CBC No results for input(s): "WBC", "NEUTROABS", "HGB", "HCT", "MCV", "PLT" in the last 72 hours. Basic Metabolic Panel Recent Labs    11/91/47 0450 06/23/23 0513 06/24/23 0609  NA 136 136 137  K 3.7 4.0 3.6  CL 96* 95* 98  CO2 28 27 30   GLUCOSE 97 92 90  BUN 19 21 17   CREATININE 1.39* 1.27* 1.05  CALCIUM  9.3 9.1 8.9  MG 1.8 2.0  --    Liver Function Tests No results for input(s): "AST", "ALT", "ALKPHOS", "BILITOT", "PROT", "ALBUMIN" in the last 72 hours. No results for input(s): "LIPASE", "AMYLASE" in the last 72 hours. High Sensitivity Troponin:   Recent Labs  Lab 06/20/23 1736 06/20/23 1952  TROPONINIHS 6 7    No results for input(s): "TRNPT"  in the last 720 hours.  BNP Invalid input(s): "POCBNP" No results for input(s): "PROBNP" in the last 72 hours.  No results for input(s): "BNP" in the last 72 hours.  D-Dimer No results for input(s): "DDIMER" in the last 72 hours. Hemoglobin A1C No results for input(s): "HGBA1C" in the last 72 hours. Fasting Lipid Panel No results for input(s): "CHOL", "HDL", "LDLCALC", "TRIG", "CHOLHDL", "LDLDIRECT" in the last 72 hours. No results found for: "LIPOA"  Thyroid  Function Tests No results for input(s): "TSH", "T4TOTAL", "T3FREE", "THYROIDAB" in the last 72 hours.  Invalid input(s): "FREET3" _____________  ECHOCARDIOGRAM COMPLETE Result Date: 06/21/2023    ECHOCARDIOGRAM REPORT   Patient Name:   Anthony [name] Date of Exam: 06/21/2023 Medical Rec #:  [id number]       Height:       70.5 in Accession #:  [id number]      Weight:       219.1 lb Date of Birth:  02-13-1945      BSA:          2.181 m Patient Age:    77 years        BP:           99/65 mmHg Patient Gender: M               HR:           66 bpm. Exam Location:  Inpatient Procedure: 2D Echo, Cardiac Doppler, Color Doppler and Intracardiac            Opacification Agent (Both Spectral and Color Flow Doppler were            utilized during procedure). Indications:    Congestive Heart Failure I50.9  History:        Patient has prior history of Echocardiogram examinations, most                 recent 02/26/2023. Risk Factors:Hypertension, Diabetes,                 Dyslipidemia, Sleep Apnea and Obesity.  Sonographer:    Denese Finn RCS Referring Phys: [id number] [facility] J PATWARDHAN  Sonographer Comments: Image acquisition challenging due to patient body habitus. IMPRESSIONS  1. Left ventricular ejection fraction, by estimation, is >75%. The left ventricle has hyperdynamic function. The left ventricle has no regional wall motion abnormalities. There is mild left ventricular hypertrophy. Left ventricular diastolic parameters were normal.  2. Right  ventricular systolic function is mildly reduced. The right ventricular size is normal. Tricuspid regurgitation signal is inadequate for assessing PA pressure.  3. The mitral valve is grossly normal. No evidence of mitral valve regurgitation. No evidence of mitral stenosis.  4. The aortic valve was not well visualized. Unable to determine aortic valve morphology due to image quality. Aortic valve regurgitation is not visualized. Visually aortic valve is calcified, reduced leaflet excursion. Visually appears more stenotic but hemodynamically mild aortic stenosis (at RSB peak velocity 2.73m/s, MG , AVA VTI 1.46cm2).  5. The inferior vena cava is dilated in size with <50% respiratory variability, suggesting right atrial pressure of 15 mmHg. Comparison(s): A prior study was performed on 02/26/2023. Reported LVEF 60 to 65%, grade 1 diastolic dysfunction, average GLS -26.6%, mild aortic stenosis, estimated RAP 15 mmHg. FINDINGS  Left Ventricle: Left ventricular ejection fraction, by estimation, is >75%. The left ventricle has hyperdynamic function. The left ventricle has no regional wall motion abnormalities. Definity contrast agent was given IV to delineate the left ventricular endocardial borders. The left ventricular internal cavity size was normal in size. There is mild left ventricular hypertrophy. Left ventricular diastolic parameters were normal. Right Ventricle: The right ventricular size is normal. Right vetricular wall thickness was not well visualized. Right ventricular systolic function is mildly reduced. Tricuspid regurgitation signal is inadequate for assessing PA pressure. Left Atrium: Left atrial size was normal in size. Right Atrium: Right atrial size was normal in size. Pericardium: There is no evidence of pericardial effusion. Mitral Valve: The mitral valve is grossly normal. No evidence of mitral valve regurgitation. No evidence of mitral valve stenosis. Tricuspid Valve: The tricuspid valve is  grossly normal. Tricuspid valve regurgitation is not demonstrated. No evidence of tricuspid stenosis. Aortic Valve: The aortic valve was not well visualized. Aortic valve regurgitation is not visualized. Visually aortic valve is calcified, reduced leaflet excursion. Visually appears  more stenotic but hemodynamically mild aortic stenosis (at RSB peak velocity 2.25m/s, MG , AVA VTI 1.46cm2). Aortic valve mean gradient measures 7.3 mmHg. Aortic valve peak gradient measures 19.1 mmHg. Aortic valve area, by VTI measures 1.99 cm. Pulmonic Valve: The pulmonic valve was not well visualized. Pulmonic valve regurgitation is not visualized. No evidence of pulmonic stenosis. Aorta: The aortic root is normal in size and structure and the ascending aorta was not well visualized. Venous: The inferior vena cava is dilated in size with less than 50% respiratory variability, suggesting right atrial pressure of 15 mmHg. IAS/Shunts: The atrial septum is grossly normal.  LEFT VENTRICLE PLAX 2D LVIDd:         4.40 cm   Diastology LVIDs:         2.30 cm   LV e' medial:    6.20 cm/s LV PW:         1.10 cm   LV E/e' medial:  12.4 LV IVS:        1.30 cm   LV e' lateral:   8.27 cm/s LVOT diam:     2.10 cm   LV E/e' lateral: 9.3 LV SV:         85 LV SV Index:   39 LVOT Area:     3.46 cm  RIGHT VENTRICLE RV S prime:     9.57 cm/s TAPSE (M-mode): 1.5 cm LEFT ATRIUM             Index        RIGHT ATRIUM           Index LA diam:        3.10 cm 1.42 cm/m   RA Area:     16.40 cm LA Vol (A2C):   75.9 ml 34.79 ml/m  RA Volume:   46.30 ml  21.22 ml/m LA Vol (A4C):   57.3 ml 26.27 ml/m LA Biplane Vol: 71.3 ml 32.68 ml/m  AORTIC VALVE AV Area (Vmax):    1.94 cm AV Area (Vmean):   2.36 cm AV Area (VTI):     1.99 cm AV Vmax:           218.67 cm/s AV Vmean:          116.967 cm/s AV VTI:            0.426 m AV Peak Grad:      19.1 mmHg AV Mean Grad:      7.3 mmHg LVOT Vmax:         122.50 cm/s LVOT Vmean:        79.800 cm/s LVOT VTI:           0.245 m LVOT/AV VTI ratio: 0.57  AORTA Ao Root diam: 3.50 cm MITRAL VALVE MV Area (PHT): 3.37 cm    SHUNTS MV Decel Time: 225 msec    Systemic VTI:  0.24 m MV E velocity: 76.70 cm/s  Systemic Diam: 2.10 cm MV A velocity: 99.00 cm/s MV E/A ratio:  0.77 Sunit Tolia Electronically signed by Olinda Bertrand Signature Date/Time: 06/21/2023/4:50:43 PM    Final    DG Chest 2 View Result Date: 06/21/2023 CLINICAL DATA:  Exertional dyspnea. EXAM: CHEST - 2 VIEW COMPARISON:  11/10/2009 FINDINGS: Low lung volumes. The cardio pericardial silhouette is enlarged. Right lung clear. There is some trace atelectasis or infiltrate at the left base with tiny left pleural effusion. No acute bony abnormality. Telemetry leads overlie the chest. IMPRESSION: Low lung volumes with trace atelectasis or infiltrate at  the left base and tiny left pleural effusion. Electronically Signed   By: Donnal Fusi M.D.   On: 06/21/2023 07:03   Disposition Pt is being discharged home today in good condition per MD.  Follow-up Plans & Appointments  Discharge Instructions     Ambulatory referral to Physical Therapy   Complete by: As directed    Outpatient Physical [facility] resumption of care.   Discharge instructions   Complete by: As directed    Your lower extremity ultrasound will be scheduled as an outpatient       Discharge Medications Allergies as of 06/24/2023       Reactions   Lyrica [pregabalin]    Delirium, Dizziness, Drowsy   Metformin  Diarrhea        Medication List     STOP taking these medications    amLODipine  5 MG tablet Commonly known as: NORVASC    lisinopril  40 MG tablet Commonly known as: ZESTRIL    metoprolol  succinate 100 MG 24 hr tablet Commonly known as: TOPROL -XL       TAKE these medications    acetaminophen  500 MG tablet Commonly known as: TYLENOL  Take 500 mg by mouth every 6 (six) hours as needed for moderate pain.   allopurinol  300 MG tablet Commonly known as:  ZYLOPRIM  Take 300 mg by mouth in the morning.   Alogliptin  Benzoate 25 MG Tabs Take 25 mg by mouth in the morning.   aspirin  EC 81 MG tablet Take 81 mg by mouth in the morning. Swallow whole.   busPIRone  10 MG tablet Commonly known as: BUSPAR  Take 5 mg by mouth 2 (two) times daily.   carboxymethylcellulose 0.5 % Soln Commonly known as: REFRESH PLUS Place 1 drop into both eyes in the morning and at bedtime.   colchicine 0.6 MG tablet Take 0.6 mg by mouth in the morning.   divalproex  500 MG 24 hr tablet Commonly known as: DEPAKOTE  ER Take 500 mg by mouth at bedtime.   DULoxetine  30 MG capsule Commonly known as: CYMBALTA  Take 30 mg by mouth every evening.   empagliflozin  25 MG Tabs tablet Commonly known as: JARDIANCE  Take 25 mg by mouth in the morning.   famotidine  40 MG tablet Commonly known as: PEPCID  Take 40 mg by mouth daily as needed (gerd).   furosemide  80 MG tablet Commonly known as: LASIX  Take 1 tablet (80 mg total) by mouth 2 (two) times daily. What changed:  medication strength how much to take when to take this additional instructions   gabapentin  600 MG tablet Commonly known as: NEURONTIN  Take 600 mg by mouth 3 (three) times daily.   insulin  glargine-yfgn 100 UNIT/ML Pen Commonly known as: SEMGLEE  Inject 32 Units into the skin at bedtime.   Investigational - Study Medication Inject 1 Dose as directed every 6 (six) months. Study name: Lepodisiran on the Reduction of MACE w/ Elevated Lp(a) in Established CVD or High Risk for CVD ([id number]-ACCLAIM-Lp(a))  Additional study details: [employer] [phone]   isosorbide  mononitrate 30 MG 24 hr tablet Commonly known as: IMDUR  Take 1 tablet (30 mg total) by mouth daily. Start taking on: June 25, 2023 What changed:  medication strength how much to take   losartan  25 MG tablet Commonly known as: COZAAR  Take 1 tablet (25 mg total) by mouth daily. Start taking on: June 25, 2023    methocarbamol  500 MG tablet Commonly known as: ROBAXIN  Take 1 tablet (500 mg total) by mouth every 6 (six) hours as needed for muscle  spasms.   metroNIDAZOLE 0.75 % gel Commonly known as: METROGEL Apply 1 Application topically at bedtime.   mirabegron  ER 50 MG Tb24 tablet Commonly known as: MYRBETRIQ  Take 50 mg by mouth at bedtime.   nitroGLYCERIN  0.4 MG SL tablet Commonly known as: NITROSTAT  Place 0.4 mg under the tongue every 5 (five) minutes as needed for chest pain.   pantoprazole  40 MG tablet Commonly known as: PROTONIX  Take protonix  40 mg twice daily for 2 months.  Resume once per day after that. What changed:  how much to take how to take this when to take this additional instructions   pyridOXINE 100 MG tablet Commonly known as: VITAMIN B6 Take 100 mg by mouth in the morning.   rosuvastatin  20 MG tablet Commonly known as: CRESTOR  TAKE 1 TABLET BY MOUTH EVERY DAY What changed: when to take this   Sodium Fluoride 1.1 % Pste Place 1 Application onto teeth 2 (two) times daily.   solifenacin 5 MG tablet Commonly known as: VESICARE Take 5 mg by mouth in the morning.   Soothe Nighttime Oint Apply 1 Application to eye at bedtime.   Synthroid  25 MCG tablet Generic drug: levothyroxine  Take 25 mcg by mouth daily before breakfast.   tamsulosin  0.4 MG Caps capsule Commonly known as: FLOMAX  Take 0.4 mg by mouth at bedtime.   THERAWORX RELIEF EX Apply 1 Application topically daily as needed (leg cramps).   traZODone  50 MG tablet Commonly known as: DESYREL  Take 50 mg by mouth at bedtime.   VITAMIN D-3 PO Take 1,000 Units by mouth in the morning.         Outstanding Labs/Studies Lower extremity Doppler ultrasound   Duration of Discharge Encounter: APP Time: 25 minutes   Signed, Jiles Mote, PA-C 06/24/2023, 4:54 PM

## 2023-06-24 NOTE — Care Management Important Message (Signed)
 Important Message  Patient Details  Name: Anthony [name] MRN: [id number] Date of Birth: [birth year]   Important Message Given:  Yes - Medicare IM     Janith Melnick 06/24/2023, 10:33 AM

## 2023-06-24 NOTE — Progress Notes (Signed)
  Progress Note  Patient Name: Anthony [name] Date of Encounter: 06/24/2023 [facility] Cardiologist: Cody Das, MD   Interval Summary   Patient doing well, ready to go home Patient and his wife express concern that they never got the LE ultrasound to rule out DVTs -- placed an order for this and hope to have it done prior to discharge   Vital Signs Vitals:   06/24/23 0029 06/24/23 0400 06/24/23 0621 06/24/23 0819  BP: 97/69 101/66  108/74  Pulse: 84     Resp: 16 17  17   Temp: 97.6 F (36.4 C) 98.4 F (36.9 C)  97.9 F (36.6 C)  TempSrc: Axillary Axillary  Oral  SpO2: 96%   93%  Weight:   96.1 kg   Height:        Intake/Output Summary (Last 24 hours) at 06/24/2023 0852 Last data filed at 06/24/2023 0405 Gross per 24 hour  Intake 240 ml  Output 1900 ml  Net -1660 ml      06/24/2023    6:21 AM 06/23/2023    5:18 AM 06/22/2023    4:42 AM  Last 3 Weights  Weight (lbs) 211 lb 12.8 oz 214 lb 12.8 oz 199 lb 11.2 oz  Weight (kg) 96.072 kg 97.433 kg 90.583 kg     Telemetry/ECG  Sinus rhythm, HR 80s - Personally Reviewed  Physical Exam  GEN: No acute distress.   Neck: No JVD Cardiac: RRR, SEM.  Respiratory: Clear to auscultation bilaterally. GI: Soft, nontender, non-distended  MS: No edema  Assessment & Plan   Acute on chronic HFpEF Hypertension  LE edema Sent to hospital directly from outpatient appointment with Dr. Filiberto Hug  Presented with worsening LE edema, orthopnea  BNP 291 CXR showed tiny left pleural effusion  Echo showed EF > 75%, mild LVH, reduced RV function, dilated IVC Net output - 5.1 L  Most recent BP 101/66, HR 84 Creatinine trending down, 1.05 today  Home meds: PO Lasix  40 mg BID, amlodipine  5 mg daily, Jardiance  25 mg daily, Imdur  60 mg daily, lisinopril  40 mg daily, Toprol  100 mg daily Continue Jardiance  25 mg daily Continue Imdur  30 mg daily Continue losartan  25 mg daily Continue PO Lasix  80 mg BID   Follow up appointment  with cardiology APP made 6/17   CAD s/p PTCA to distal LAD 2023 Hyperlipidemia  Nuclear stress in 12/2021 showed no ischemia  Troponin negative x 2 Continue ASA 81 mg daily Continue Crestor  20 mg daily    Type 2 diabetes Continue Jardiance  25 mg daily Continue SSI while inpatient    Presyncope  Patient reported history of presyncope, recent falls Baseline neuropathy and Parkinson's could be contributing  Negative orthostatic vital signs  Consider cardiac telemetry at discharge    Mild aortic stenosis  Echo showed mild aortic stenosis, RSB peak velocity 2.6 m/s, MG 9 mmHg, AVA VTI 1.46 cm2    Parkinson's disease Continue home medications    Hypothyroidism Continue levothyroxine    Medical Readiness Date: 06/24/2023    For questions or updates, please contact [facility] Please consult [email or web address] for contact info under       Signed, Jiles Mote, PA-C

## 2023-06-24 NOTE — Progress Notes (Signed)
 Mobility Specialist Progress Note;   06/24/23 0917  Mobility  Activity Ambulated independently in hallway  Level of Assistance Standby assist, set-up cues, supervision of patient - no hands on  Assistive Device None  Distance Ambulated (ft) 350 ft  Activity Response Tolerated well  Mobility Referral Yes  Mobility visit 1 Mobility  Mobility Specialist Start Time (ACUTE ONLY) [id number]  Mobility Specialist Stop Time (ACUTE ONLY) 0926  Mobility Specialist Time Calculation (min) (ACUTE ONLY) 9 min   Pt eager for mobility. Required no physical assistance during ambulation, SV. Ambulated on RA, SPO2 93%> throughout. C/o Bil feet feeling numb, otherwise asx. Pt returned back to bed with all needs met, call bell in reach. Wife present.   Janit Meline Mobility Specialist Please contact via SecureChat or [employer] [phone]

## 2023-06-24 NOTE — Progress Notes (Deleted)
 SATURATION QUALIFICATIONS: (This note is used to comply with regulatory documentation for home oxygen )   Patient Saturations on Room Air at Rest = 87%   Patient Saturations on Room Air while Ambulating = not tested as patient desaturated at rest   Patient Saturations on 2 Liters of oxygen   = 92%   Please briefly explain why patient needs home oxygen : patient is requiring 2 liters of oxygen  to maintain oxygen  saturations >92%

## 2023-06-24 NOTE — Progress Notes (Signed)
 Heart Failure Navigator Progress Note  Assessed for Heart & Vascular TOC clinic readiness.  Patient does not meet criteria due to EF >75%, has a scheduled [facility] appointment on 07/08/2023. .   Navigator will sign off at this time.   Randie Bustle, BSN, Scientist, clinical (histocompatibility and immunogenetics) Only

## 2023-06-25 ENCOUNTER — Other Ambulatory Visit: Payer: Self-pay | Admitting: Neurology

## 2023-06-25 DIAGNOSIS — I50813 Acute on chronic right heart failure: Secondary | ICD-10-CM

## 2023-06-25 NOTE — Progress Notes (Signed)
 Cardiac function decompensation, fluid retention, acute on chronic failure. SOB.   Ordered BiPAP titration, may use ASV . Explore all settings. Xanax for sleep aid is also ordered to be used in lab.   Anthony Banner, MD

## 2023-06-26 ENCOUNTER — Telehealth: Payer: Self-pay | Admitting: Neurology

## 2023-06-26 NOTE — Telephone Encounter (Signed)
 CPAP HTA pending

## 2023-06-30 ENCOUNTER — Other Ambulatory Visit: Payer: Self-pay

## 2023-06-30 ENCOUNTER — Other Ambulatory Visit (HOSPITAL_COMMUNITY): Payer: Self-pay | Admitting: Adult Health

## 2023-06-30 ENCOUNTER — Ambulatory Visit: Attending: Adult Health

## 2023-06-30 DIAGNOSIS — R262 Difficulty in walking, not elsewhere classified: Secondary | ICD-10-CM | POA: Insufficient documentation

## 2023-06-30 DIAGNOSIS — G4733 Obstructive sleep apnea (adult) (pediatric): Secondary | ICD-10-CM | POA: Diagnosis not present

## 2023-06-30 DIAGNOSIS — R2689 Other abnormalities of gait and mobility: Secondary | ICD-10-CM | POA: Insufficient documentation

## 2023-06-30 DIAGNOSIS — Z794 Long term (current) use of insulin: Secondary | ICD-10-CM | POA: Insufficient documentation

## 2023-06-30 DIAGNOSIS — G20A1 Parkinson's disease without dyskinesia, without mention of fluctuations: Secondary | ICD-10-CM | POA: Diagnosis not present

## 2023-06-30 DIAGNOSIS — E113391 Type 2 diabetes mellitus with moderate nonproliferative diabetic retinopathy without macular edema, right eye: Secondary | ICD-10-CM | POA: Diagnosis not present

## 2023-06-30 DIAGNOSIS — M542 Cervicalgia: Secondary | ICD-10-CM | POA: Insufficient documentation

## 2023-06-30 DIAGNOSIS — M6281 Muscle weakness (generalized): Secondary | ICD-10-CM | POA: Insufficient documentation

## 2023-06-30 DIAGNOSIS — R2681 Unsteadiness on feet: Secondary | ICD-10-CM | POA: Insufficient documentation

## 2023-06-30 NOTE — Progress Notes (Signed)
 We can discuss the  BiPAP study in July, it doesn't have to be done ASAP.   The cardiologic developments raise the suspicion of central apneas evolving.

## 2023-06-30 NOTE — Therapy (Unsigned)
 OUTPATIENT PHYSICAL THERAPY NEURO EVALUATION   Patient Name: Anthony [name] MRN: [id number] DOB:1945-02-03, 78 y.o., male Today's Date: 06/30/2023   PCP: Dohmeier, Raoul Byes REFERRING PROVIDER: Jiles Mote, PA-C  END OF SESSION:  PT End of Session - 06/30/23 1405     Visit Number 1    Number of Visits 13    Date for PT Re-Evaluation 08/11/23    Authorization Type [employer]    Progress Note Due on Visit 10    PT Start Time 1405    PT Stop Time 1445    PT Time Calculation (min) 40 min             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   Neuromuscular disorder (HCC) 2023   Parkinson's Disease   Neuropathy    left leg greater than right leg   Obesity (BMI 30.0-34.9) 05/14/2013   OSA on CPAP    cpap setting of 10/ 13   Pancreatitis dx march 2016   Pneumonia 12 years ago   Past Surgical History:  Procedure Laterality Date   ANTERIOR CERVICAL DECOMP/DISCECTOMY FUSION N/A 08/02/2022   Procedure: Anterior Cervical Decompression/Discectomy Fusion - Cervical Three-Cervical Four,  Cervical Four-Cervical Five,  remove Cervical Five-Cervical Six Plate;  Surgeon: Joaquin Mulberry, MD;  Location: [facility] OR;  Service: Neurosurgery;  Laterality: N/A;  3C   BACK SURGERY  10/2009   Cervical, arterior   CARPAL TUNNEL RELEASE Left 2003   CARPAL TUNNEL RELEASE Bilateral    CATARACT EXTRACTION Bilateral 01/2012   COLONOSCOPY  2024   CORONARY BALLOON ANGIOPLASTY N/A 08/28/2021   Procedure: CORONARY BALLOON ANGIOPLASTY;   Surgeon: Cody Das, MD;  Location: [facility];  Service: Cardiovascular;  Laterality: N/A;   EUS N/A 07/15/2014   Procedure: FULL UPPER ENDOSCOPIC ULTRASOUND (EUS) RADIAL;  Surgeon: Alvis Jourdain, MD;  Location: [facility] ENDOSCOPY;  Service: Endoscopy;  Laterality: N/A;   LEFT HEART CATH AND CORONARY ANGIOGRAPHY N/A 05/18/2019   Procedure: LEFT HEART CATH AND CORONARY ANGIOGRAPHY;  Surgeon: Knox Perl, MD;  Location: [facility];  Service: Cardiovascular;  Laterality: N/A;   LEFT HEART CATH AND CORONARY ANGIOGRAPHY N/A 08/28/2021   Procedure: LEFT HEART CATH AND CORONARY ANGIOGRAPHY;  Surgeon: Cody Das, MD;  Location: [facility];  Service: Cardiovascular;  Laterality: N/A;   NASAL SINUS SURGERY  1981   RETINAL Bilateral 06/2013   Retinal peel   SHOULDER SURGERY Right 2003   TONSILLECTOMY  1954   Patient Active Problem List   Diagnosis Date Noted   Acute on chronic heart failure with preserved ejection fraction (HFpEF) (HCC) 06/24/2023   Mild aortic stenosis 06/24/2023   Hypothyroidism 06/24/2023   Heart failure (HCC) 06/20/2023   Acute right-sided congestive heart failure (HCC) 06/20/2023   Shortness of breath 06/20/2023   Nonrheumatic aortic valve stenosis 02/07/2023   Hypersomnia, persistent 12/26/2022  Nasal congestion 12/26/2022   Post traumatic stress disorder (PTSD) 12/26/2022   S/P cervical spinal fusion 08/02/2022   Parkinsonism (HCC) 12/06/2021   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/06/2021   Hyperkalemia 12/06/2020   Coronary artery disease of native artery of native heart with stable angina pectoris (HCC) 12/03/2019   Esophageal dysphagia 11/21/2019   Gastroesophageal reflux disease 11/21/2019   Family history of colon cancer 11/21/2019   Abnormal stress test 05/11/2019   Mixed hyperlipidemia 05/11/2019   Angina pectoris (HCC) 08/07/2018   Essential hypertension 08/07/2018   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, with long-term current use of insulin  (HCC) 08/07/2018   Morbid obesity (HCC) 08/28/2017   Neuropathy 08/28/2017   Numbness and tingling of both legs below knees 11/10/2013   OSA on CPAP 05/14/2013   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   Obesity (BMI 30.0-34.9) 05/14/2013    ONSET DATE: worse over past several weeks  REFERRING DIAG: R26.89 (ICD-10-CM) - Other abnormalities of gait and mobility  THERAPY DIAG:  Other abnormalities of gait and mobility  Unsteadiness on feet  Muscle weakness (generalized)  Difficulty in walking, not elsewhere classified  Rationale for Evaluation and Treatment: Rehabilitation  SUBJECTIVE:                                                                                                                                                                                             SUBJECTIVE STATEMENT: Has been in hospital past 5 days related to CHF.  Has had increased issues with balance and falls lately with most recent fall x 7-8 days ago striking left side of face and shoulder. Notes issue of imbalance when initially arising (denies any syncope).  Notes increased frequency of falls over past 6 months. Has tried using a cane but notes difficulty with coordinating. Reports increased difficulty with left hand control/tremoring.  Reports limited activity lately due to medical issues and interaction of conditions concerned regarding number of medications/interactions.  Pt accompanied by: significant other  PERTINENT HISTORY: hypertension, hyperlipidemia, CAD, type 2 DM, mild AS, OSA on CPAP, hypothyroidism, Parkinson's disease, GERD.  PAIN:  Are you having pain? Yes: NPRS scale: 7/10 Pain location: lower neck Pain description: sore, heavy headed Aggravating factors: upright Relieving factors: reclined, rest  PRECAUTIONS: Fall  RED FLAGS: None   WEIGHT BEARING RESTRICTIONS: No  FALLS: Has patient fallen in last  6 months? Yes. Number of falls 7  LIVING ENVIRONMENT: Lives with: lives with their family and lives with their spouse Lives in: House/apartment Stairs: Yes, exterior: 4 steps; Ground-floor set-up Has following equipment at home: None  PLOF: Independent with basic ADLs  PATIENT GOALS: improve balance  OBJECTIVE:  Note: Objective measures were completed at Evaluation unless otherwise noted.  DIAGNOSTIC FINDINGS:   Vitals: 107/67 mmHg, 91 bpm  COGNITION: Overall cognitive status: Within functional limits for tasks assessed   SENSATION: Not tested, reports hx of neuropathy in fingers  COORDINATION: Difficulty to rapid alternating movement Heel to shin WNL  EDEMA:  None at present, currently daily weights for CHF  MUSCLE TONE: no hypertonicity noted  MUSCLE LENGTH: WNL  DTRs:  NT  POSTURE: rounded shoulders and forward head  LOWER EXTREMITY ROM:     Active  Right Eval Left Eval  Hip flexion    Hip extension    Hip abduction    Hip adduction    Hip internal rotation    Hip external rotation    Knee flexion    Knee extension    Ankle dorsiflexion    Ankle plantarflexion    Ankle inversion    Ankle eversion     (Blank rows = not tested)  LOWER EXTREMITY MMT:    MMT Right Eval Left Eval  Hip flexion    Hip extension    Hip abduction    Hip adduction    Hip internal rotation    Hip external rotation    Knee flexion    Knee extension    Ankle dorsiflexion    Ankle plantarflexion    Ankle inversion    Ankle eversion    (Blank rows = not tested)  BED MOBILITY:  Independent  TRANSFERS: Independent with sit-stand and chair-chair Floor to stand: reports need for physical assistance after falling or using furniture if intentionally getting on to ground and up again  STAIRS: TBD GAIT: Findings: Comments: ambulates independently level surfaces, no AD in past  FUNCTIONAL TESTS:  Mini-BESTest: TBD Berg Balance Test: 45/56 : TBD  PATIENT  SURVEYS:  Freezing of gait questionnaire TBD                                                                                                                              TREATMENT DATE:     PATIENT EDUCATION: Education details: assessment details Person educated: Patient and Spouse Education method: Explanation Education comprehension: verbalized understanding  HOME EXERCISE PROGRAM: TBD  GOALS: Goals reviewed with patient? Yes  SHORT TERM GOALS: Target date: 07/21/2023    Patient will be independent in HEP to improve functional outcomes Baseline: Goal status: INITIAL  2.  Demo improved BLE strength and balance per time < 15 sec 5xSTS test Baseline: TBD Goal status: INITIAL  3.  Demo improved static balance and reduced risk for falls per score 50/56 Berg Balance Test Baseline: 45/56 Goal status: INITIAL    LONG TERM GOALS: Target date: 08/11/2023    Demo improved mobility and reduced risk for falls per score 24/28 Mini-BESTest Baseline: TBD Goal status: INITIAL  2.  Demo reduced risk for falls per time 4.8  sec Baseline: TBD Goal status: INITIAL  3.  to be measured and goal written Baseline:  Goal status: INITIAL  4.  Teach back relevant programs/activities for fitness/exercise for those w/ PD to improve carryover at D/C Baseline:  Goal status: INITIAL   ASSESSMENT:  CLINICAL IMPRESSION: Patient is a 78 y.o. male who was seen today for physical therapy evaluation and treatment for Parkinson's Disease.  Demonstrates reduced activity tolerance, balance deficits, reduced ability to safely ambulate, recent hx of falls, and increased need for assistance from caregivers in ADL and house tasks.  Patient demo moderate risk for falls per [facility] and needs further assessment as to compare current level of function to PLOF and develop relevant treatment strategies and interventions to improve mobility and reduce risk fo rfalls   OBJECTIVE  IMPAIRMENTS: Abnormal gait, decreased activity tolerance, decreased balance, decreased coordination, decreased endurance, decreased knowledge of use of DME, difficulty walking, decreased strength, dizziness, and postural dysfunction.   ACTIVITY LIMITATIONS: carrying, lifting, bending, squatting, transfers, reach over head, and locomotion level  PARTICIPATION LIMITATIONS: meal prep, cleaning, laundry, interpersonal relationship, shopping, community activity, and yard work  PERSONAL FACTORS: Age, Time since onset of injury/illness/exacerbation, and 3+ comorbidities: PMH are also affecting patient's functional outcome.   REHAB POTENTIAL: Good  CLINICAL DECISION MAKING: Evolving/moderate complexity  EVALUATION COMPLEXITY: Moderate  PLAN:  PT FREQUENCY: 1-2x/week  PT DURATION: 6 weeks  PLANNED INTERVENTIONS: 97750- Physical Performance Testing, 97110-Therapeutic exercises, 97530- Therapeutic activity, [id number]- Neuromuscular re-education, 97535- Self Care, 13244- Manual therapy, [id number]- Gait training, [phone]- Orthotic Initial, [phone]- Canalith repositioning, and [phone]- Aquatic Therapy  PLAN FOR NEXT SESSION: 5xSTS, Mini-BESTest,   7:58 AM, 07/01/23 M. Kelly Ednah Hammock, PT, DPT Physical Therapist- [place] Office Number: [phone]

## 2023-07-02 DIAGNOSIS — S00212A Abrasion of left eyelid and periocular area, initial encounter: Secondary | ICD-10-CM | POA: Diagnosis not present

## 2023-07-02 DIAGNOSIS — S0081XA Abrasion of other part of head, initial encounter: Secondary | ICD-10-CM | POA: Diagnosis not present

## 2023-07-03 ENCOUNTER — Ambulatory Visit

## 2023-07-03 DIAGNOSIS — R2689 Other abnormalities of gait and mobility: Secondary | ICD-10-CM | POA: Diagnosis not present

## 2023-07-03 DIAGNOSIS — R2681 Unsteadiness on feet: Secondary | ICD-10-CM

## 2023-07-03 DIAGNOSIS — Z794 Long term (current) use of insulin: Secondary | ICD-10-CM | POA: Diagnosis not present

## 2023-07-03 DIAGNOSIS — R55 Syncope and collapse: Secondary | ICD-10-CM | POA: Diagnosis not present

## 2023-07-03 DIAGNOSIS — I119 Hypertensive heart disease without heart failure: Secondary | ICD-10-CM | POA: Diagnosis not present

## 2023-07-03 DIAGNOSIS — G4733 Obstructive sleep apnea (adult) (pediatric): Secondary | ICD-10-CM | POA: Diagnosis not present

## 2023-07-03 DIAGNOSIS — J90 Pleural effusion, not elsewhere classified: Secondary | ICD-10-CM | POA: Diagnosis not present

## 2023-07-03 DIAGNOSIS — M6281 Muscle weakness (generalized): Secondary | ICD-10-CM

## 2023-07-03 DIAGNOSIS — I509 Heart failure, unspecified: Secondary | ICD-10-CM | POA: Diagnosis not present

## 2023-07-03 DIAGNOSIS — I2510 Atherosclerotic heart disease of native coronary artery without angina pectoris: Secondary | ICD-10-CM | POA: Diagnosis not present

## 2023-07-03 DIAGNOSIS — E039 Hypothyroidism, unspecified: Secondary | ICD-10-CM | POA: Diagnosis not present

## 2023-07-03 DIAGNOSIS — E1140 Type 2 diabetes mellitus with diabetic neuropathy, unspecified: Secondary | ICD-10-CM | POA: Diagnosis not present

## 2023-07-03 DIAGNOSIS — G20A1 Parkinson's disease without dyskinesia, without mention of fluctuations: Secondary | ICD-10-CM | POA: Diagnosis not present

## 2023-07-03 DIAGNOSIS — I700 Atherosclerosis of aorta: Secondary | ICD-10-CM | POA: Diagnosis not present

## 2023-07-03 DIAGNOSIS — R600 Localized edema: Secondary | ICD-10-CM | POA: Diagnosis not present

## 2023-07-03 DIAGNOSIS — R262 Difficulty in walking, not elsewhere classified: Secondary | ICD-10-CM

## 2023-07-03 NOTE — Therapy (Signed)
 OUTPATIENT PHYSICAL THERAPY NEURO TREATMENT   Patient Name: Anthony [name] MRN: [id number] DOB:January 10, 1946, 78 y.o., male Today's Date: 07/03/2023   PCP: Dohmeier, Raoul Byes REFERRING PROVIDER: Jiles Mote, PA-C  END OF SESSION:  PT End of Session - 07/03/23 1540     Visit Number 2    Number of Visits 13    Date for PT Re-Evaluation 08/11/23    Authorization Type [employer]    Progress Note Due on Visit 10    PT Start Time 1535    PT Stop Time 1615    PT Time Calculation (min) 40 min          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   Neuromuscular disorder (HCC) 2023   Parkinson's Disease   Neuropathy    left leg greater than right leg   Obesity (BMI 30.0-34.9) 05/14/2013   OSA on CPAP    cpap setting of 10/ 13   Pancreatitis dx march 2016   Pneumonia 12 years ago   Past Surgical History:  Procedure Laterality Date   ANTERIOR CERVICAL DECOMP/DISCECTOMY FUSION N/A 08/02/2022   Procedure: Anterior Cervical Decompression/Discectomy Fusion - Cervical Three-Cervical Four,  Cervical Four-Cervical Five,  remove Cervical Five-Cervical Six Plate;  Surgeon: Joaquin Mulberry, MD;  Location: [facility] OR;  Service: Neurosurgery;  Laterality: N/A;  3C   BACK SURGERY  10/2009   Cervical, arterior   CARPAL TUNNEL RELEASE Left 2003   CARPAL TUNNEL RELEASE Bilateral    CATARACT EXTRACTION Bilateral 01/2012   COLONOSCOPY  2024   CORONARY BALLOON ANGIOPLASTY N/A 08/28/2021   Procedure: CORONARY BALLOON ANGIOPLASTY;  Surgeon:  Cody Das, MD;  Location: [facility];  Service: Cardiovascular;  Laterality: N/A;   EUS N/A 07/15/2014   Procedure: FULL UPPER ENDOSCOPIC ULTRASOUND (EUS) RADIAL;  Surgeon: Alvis Jourdain, MD;  Location: [facility] END[facility]COPY;  Service: Endoscopy;  Laterality: N/A;   LEFT HEART CATH AND CORONARY ANGIOGRAPHY N/A 05/18/2019   Procedure: LEFT HEART CATH AND CORONARY ANGIOGRAPHY;  Surgeon: Knox Perl, MD;  Location: [facility];  Service: Cardiovascular;  Laterality: N/A;   LEFT HEART CATH AND CORONARY ANGIOGRAPHY N/A 08/28/2021   Procedure: LEFT HEART CATH AND CORONARY ANGIOGRAPHY;  Surgeon: Cody Das, MD;  Location: [facility];  Service: Cardiovascular;  Laterality: N/A;   NASAL SINUS SURGERY  1981   RETINAL Bilateral 06/2013   Retinal peel   SHOULDER SURGERY Right 2003   TONSILLECTOMY  1954   Patient Active Problem List   Diagnosis Date Noted   Acute on chronic heart failure with preserved ejection fraction (HFpEF) (HCC) 06/24/2023   Mild aortic stenosis 06/24/2023   Hypothyroidism 06/24/2023   Heart failure (HCC) 06/20/2023   Acute right-sided congestive heart failure (HCC) 06/20/2023   Shortness of breath 06/20/2023   Nonrheumatic aortic valve stenosis 02/07/2023   Hypersomnia, persistent 12/26/2022   Nasal  congestion 12/26/2022   Post traumatic stress disorder (PTSD) 12/26/2022   S/P cervical spinal fusion 08/02/2022   Parkinsonism (HCC) 12/06/2021   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/06/2021   Hyperkalemia 12/06/2020   Coronary artery disease of native artery of native heart with stable angina pectoris (HCC) 12/03/2019   Esophageal dysphagia 11/21/2019   Gastroesophageal reflux disease 11/21/2019   Family history of colon cancer 11/21/2019   Abnormal stress test 05/11/2019   Mixed hyperlipidemia 05/11/2019   Angina pectoris (HCC) 08/07/2018   Essential hypertension 08/07/2018   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, with long-term current use of insulin  (HCC) 08/07/2018   Morbid obesity (HCC) 08/28/2017   Neuropathy 08/28/2017   Numbness and tingling of both legs below knees 11/10/2013   OSA on CPAP 05/14/2013   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   Obesity (BMI 30.0-34.9) 05/14/2013    ONSET DATE: worse over past several weeks  REFERRING DIAG: R26.89 (ICD-10-CM) - Other abnormalities of gait and mobility  THERAPY DIAG:  Other abnormalities of gait and mobility  Unsteadiness on feet  Muscle weakness (generalized)  Difficulty in walking, not elsewhere classified  Rationale for Evaluation and Treatment: Rehabilitation  SUBJECTIVE:                                                                                                                                                                                             SUBJECTIVE STATEMENT: Has been to MD and found to have orthostatic hypotension. Will be going to cardiologist on June 17 Pt accompanied by: significant other  PERTINENT HISTORY: hypertension, hyperlipidemia, CAD, type 2 DM, mild AS, OSA on CPAP, hypothyroidism, Parkinson's disease, GERD.  PAIN:  Are you having pain? Yes: NPRS scale: 5/10 Pain location: lower leftneck Pain description: sore, heavy headed Aggravating factors: upright Relieving factors: reclined, rest  PRECAUTIONS: Fall  RED FLAGS: None   WEIGHT BEARING RESTRICTIONS: No  FALLS: Has patient fallen in last 6 months? Yes. Number of falls 7  LIVING ENVIRONMENT: Lives with: lives with their family and lives with their spouse Lives in: House/apartment Stairs: Yes, exterior: 4 steps; Ground-floor set-up Has following equipment at home: None  PLOF: Independent with basic ADLs  PATIENT GOALS: improve balance  OBJECTIVE:   TODAY'S TREATMENT: 07/03/23 Activity Comments  5xSTS  20 sec pushing knees against table  Mini-BESTest 15/28  7.88, 8.22, sec  Vitals: 92/63 mmHg, 79  bpm   NU-step level 3 x 5 min To increase BP/activity tolerance  Vitals: 107/65, 83 bpm        [facility] PT Assessment - 07/03/23 0001  Standardized Balance Assessment   Standardized Balance Assessment Five Times Sit to Stand;Mini-BESTest    Five times sit to stand comments  20 sec      Mini-BESTest   Sit To Stand Normal: Comes to stand without use of hands and stabilizes independently.    Rise to Toes Normal: Stable for 3 s with maximum height.    Stand on one leg (left) Moderate: < 20 s    Stand on one leg (right) Moderate: < 20 s    Stand on one leg - lowest score 1    Compensatory Stepping Correction - Forward No step, OR would fall if not caught, OR falls spontaneously.    Compensatory Stepping Correction - Backward No step, OR would fall if not caught, OR falls spontaneously.    Compensatory Stepping Correction - Left Lateral Severe: Falls, or cannot step    Compensatory Stepping Correction - Right Lateral Severe:  Falls, or cannot step    Stepping Corredtion Lateral - lowest score 0    Stance - Feet together, eyes open, firm surface  Normal: 30s    Stance - Feet together, eyes closed, foam surface  Moderate: < 30s    Incline - Eyes Closed Normal: Stands independently 30s and aligns with gravity    Change in Gait Speed Moderate: Unable to change walking speed or signs of imbalance    Walk with head turns - Horizontal Moderate: performs head turns with reduction in gait speed.    Walk with pivot turns Normal: Turns with feet close FAST (< 3 steps) with good balance.    Step over obstacles Moderate: Steps over box but touches box OR displays cautious behavior by slowing gait.    Timed UP & GO with Dual Task Severe: Stops counting while walking OR stops walking while counting.   regular: 16.32; cog: 16.35--stops counting   Mini-BEST total score 15            Note: Objective measures were completed at Evaluation unless otherwise noted.  DIAGNOSTIC FINDINGS:   Vitals: 107/67  mmHg, 91 bpm  COGNITION: Overall cognitive status: Within functional limits for tasks assessed   SENSATION: Not tested, reports hx of neuropathy in fingers  COORDINATION: Difficulty to rapid alternating movement Heel to shin WNL  EDEMA:  None at present, currently daily weights for CHF  MUSCLE TONE: no hypertonicity noted  MUSCLE LENGTH: WNL  DTRs:  NT  POSTURE: rounded shoulders and forward head  LOWER EXTREMITY ROM:     Active  Right Eval Left Eval  Hip flexion    Hip extension    Hip abduction    Hip adduction    Hip internal rotation    Hip external rotation    Knee flexion    Knee extension    Ankle dorsiflexion    Ankle plantarflexion    Ankle inversion    Ankle eversion     (Blank rows = not tested)  LOWER EXTREMITY MMT:    MMT Right Eval Left Eval  Hip flexion    Hip extension    Hip abduction    Hip adduction    Hip internal rotation    Hip external rotation    Knee flexion    Knee extension    Ankle dorsiflexion    Ankle plantarflexion    Ankle inversion    Ankle eversion    (Blank rows = not tested)  BED MOBILITY:  Independent  TRANSFERS: Independent with sit-stand and chair-chair Floor to stand: reports  need for physical assistance after falling or using furniture if intentionally getting on to ground and up again  STAIRS: TBD GAIT: Findings: Comments: ambulates independently level surfaces, no AD in past  FUNCTIONAL TESTS:  Mini-BESTest: TBD Berg Balance Test: 45/56 : TBD  PATIENT SURVEYS:  Freezing of gait questionnaire TBD                                                                                                                              TREATMENT DATE:     PATIENT EDUCATION: Education details: assessment details Person educated: Patient and Spouse Education method: Explanation Education comprehension: verbalized understanding  HOME EXERCISE PROGRAM: TBD  GOALS: Goals reviewed with patient?  Yes  SHORT TERM GOALS: Target date: 07/21/2023    Patient will be independent in HEP to improve functional outcomes Baseline: Goal status: INITIAL  2.  Demo improved BLE strength and balance per time < 15 sec 5xSTS test Baseline: 20 sec Goal status: INITIAL  3.  Demo improved static balance and reduced risk for falls per score 50/56 Berg Balance Test Baseline: 45/56 Goal status: INITIAL    LONG TERM GOALS: Target date: 08/11/2023    Demo improved mobility and reduced risk for falls per score 24/28 Mini-BESTest Baseline: 15/28 Goal status: INITIAL  2.  Demo reduced risk for falls per time 4.8 sec Baseline: 8.05 sec Goal status: INITIAL  3.  to be measured and goal written Baseline:  Goal status: INITIAL  4.  Teach back relevant programs/activities for fitness/exercise for those w/ PD to improve carryover at D/C Baseline:  Goal status: INITIAL   ASSESSMENT:  CLINICAL IMPRESSION: 5xSTS reveals LE weakness/balance deficits/risk for falls requiring 20 sec and pushing legs against chair to arise. Mini-BESTest score 15/28 indicating high risk for falls and revealing for limited function of righting reactions requiring physical assist to stop falls. time of 8 sec indicating high risk for falls.  Pt is having ongoing issue of orthostatic hypotension and recent CHF exacerbation and subsequent fluid restrictions likely contributing to his worse episodes of dizziness/lightheaded with position change.  Educated on seated LE exercise prior to change of position to help offset these events and verbalizes understanding.  Continued sessions to advance POC details to improve mobility and reduce risk for falls  OBJECTIVE IMPAIRMENTS: Abnormal gait, decreased activity tolerance, decreased balance, decreased coordination, decreased endurance, decreased knowledge of use of DME, difficulty walking, decreased strength, dizziness, and postural dysfunction.   ACTIVITY LIMITATIONS:  carrying, lifting, bending, squatting, transfers, reach over head, and locomotion level  PARTICIPATION LIMITATIONS: meal prep, cleaning, laundry, interpersonal relationship, shopping, community activity, and yard work  PERSONAL FACTORS: Age, Time since onset of injury/illness/exacerbation, and 3+ comorbidities: PMH are also affecting patient's functional outcome.   REHAB POTENTIAL: Good  CLINICAL DECISION MAKING: Evolving/moderate complexity  EVALUATION COMPLEXITY: Moderate  PLAN:  PT FREQUENCY: 1-2x/week  PT DURATION: 6 weeks  PLANNED INTERVENTIONS: 97750- Physical Performance Testing, 97110-Therapeutic exercises,  16109- Therapeutic activity, [id number]- Neuromuscular re-education, [id number]- Self Care, [id number]- Manual therapy, [id number]- Gait training, [phone]- Orthotic Initial, [phone]- Canalith repositioning, and [phone]- Aquatic Therapy  PLAN FOR NEXT SESSION:   3:40 PM, 07/03/23 M. Kelly Lyna Laningham, PT, DPT Physical Therapist- [place] Office Number: [phone]

## 2023-07-04 NOTE — Therapy (Signed)
 OUTPATIENT PHYSICAL THERAPY NEURO TREATMENT   Patient Name: Anthony [name] MRN: [id number] DOB:05-03-1945, 78 y.o., male Today's Date: 07/07/2023   PCP: Dohmeier, Raoul Byes REFERRING PROVIDER: Jiles Mote, PA-C  END OF SESSION:  PT End of Session - 07/07/23 1531     Visit Number 3    Number of Visits 13    Date for PT Re-Evaluation 08/11/23    Authorization Type [employer]    Progress Note Due on Visit 10    PT Start Time 1447    PT Stop Time 1532    PT Time Calculation (min) 45 min    Equipment Utilized During Treatment Gait belt    Activity Tolerance Patient tolerated treatment well;Other (comment)   lightheadedness   Behavior During Therapy WFL for tasks assessed/performed           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   Neuromuscular disorder (HCC) 2023   Parkinson's Disease   Neuropathy    left leg greater than right leg   Obesity (BMI 30.0-34.9) 05/14/2013   OSA on CPAP    cpap setting of 10/ 13   Pancreatitis dx march 2016   Pneumonia 12 years ago   Past Surgical History:  Procedure Laterality Date   ANTERIOR CERVICAL DECOMP/DISCECTOMY FUSION N/A 08/02/2022   Procedure: Anterior Cervical Decompression/Discectomy Fusion - Cervical Three-Cervical Four,  Cervical Four-Cervical Five,  remove Cervical Five-Cervical Six Plate;  Surgeon: Joaquin Mulberry, MD;  Location: [facility] OR;  Service: Neurosurgery;  Laterality: N/A;  3C   BACK SURGERY  10/2009   Cervical, arterior   CARPAL TUNNEL RELEASE Left  2003   CARPAL TUNNEL RELEASE Bilateral    CATARACT EXTRACTION Bilateral 01/2012   COLONOSCOPY  2024   CORONARY BALLOON ANGIOPLASTY N/A 08/28/2021   Procedure: CORONARY BALLOON ANGIOPLASTY;  Surgeon: Cody Das, MD;  Location: [facility];  Service: Cardiovascular;  Laterality: N/A;   EUS N/A 07/15/2014   Procedure: FULL UPPER ENDOSCOPIC ULTRASOUND (EUS) RADIAL;  Surgeon: Alvis Jourdain, MD;  Location: [facility] ENDOSCOPY;  Service: Endoscopy;  Laterality: N/A;   LEFT HEART CATH AND CORONARY ANGIOGRAPHY N/A 05/18/2019   Procedure: LEFT HEART CATH AND CORONARY ANGIOGRAPHY;  Surgeon: Knox Perl, MD;  Location: [facility];  Service: Cardiovascular;  Laterality: N/A;   LEFT HEART CATH AND CORONARY ANGIOGRAPHY N/A 08/28/2021   Procedure: LEFT HEART CATH AND CORONARY ANGIOGRAPHY;  Surgeon: Cody Das, MD;  Location: [facility];  Service: Cardiovascular;  Laterality: N/A;   NASAL SINUS SURGERY  1981   RETINAL Bilateral 06/2013   Retinal peel   SHOULDER SURGERY Right 2003   TONSILLECTOMY  1954   Patient Active Problem List   Diagnosis Date Noted   Acute on chronic heart failure with preserved ejection fraction (HFpEF) (HCC) 06/24/2023   Mild aortic stenosis 06/24/2023   Hypothyroidism 06/24/2023   Heart failure (  HCC) 06/20/2023   Acute right-sided congestive heart failure (HCC) 06/20/2023   Shortness of breath 06/20/2023   Nonrheumatic aortic valve stenosis 02/07/2023   Hypersomnia, persistent 12/26/2022   Nasal congestion 12/26/2022   Post traumatic stress disorder (PTSD) 12/26/2022   S/P cervical spinal fusion 08/02/2022   Parkinsonism (HCC) 12/06/2021   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/06/2021   Hyperkalemia 12/06/2020   Coronary artery disease of native artery of native heart with stable angina pectoris (HCC) 12/03/2019   Esophageal dysphagia 11/21/2019   Gastroesophageal reflux disease 11/21/2019   Family history of colon cancer  11/21/2019   Abnormal stress test 05/11/2019   Mixed hyperlipidemia 05/11/2019   Angina pectoris (HCC) 08/07/2018   Essential hypertension 08/07/2018   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, with long-term current use of insulin  (HCC) 08/07/2018   Morbid obesity (HCC) 08/28/2017   Neuropathy 08/28/2017   Numbness and tingling of both legs below knees 11/10/2013   OSA on CPAP 05/14/2013   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   Obesity (BMI 30.0-34.9) 05/14/2013    ONSET DATE: worse over past several weeks  REFERRING DIAG: R26.89 (ICD-10-CM) - Other abnormalities of gait and mobility  THERAPY DIAG:  Other abnormalities of gait and mobility  Unsteadiness on feet  Muscle weakness (generalized)  Difficulty in walking, not elsewhere classified  Rationale for Evaluation and Treatment: Rehabilitation  SUBJECTIVE:                                                                                                                                                                                             SUBJECTIVE STATEMENT: Reports that he is still dealing with lightheadedness standing up from a chair and also reports spinning when sitting up from bed. Reports that he feels spinning not as much.  Denies hx of vertigo. Reports that he is confused about how to address orthostasis. Pt reports that he is on a fluid restriction for his CHF. Reports neck pain since his fall. Denies HAs or dizziness with head turns since his fall.  Pt accompanied by: significant other  PERTINENT HISTORY: hypertension, hyperlipidemia, CAD, type 2 DM, mild AS, OSA on CPAP, hypothyroidism, Parkinson's disease, GERD.  PAIN:  Are you having pain? Yes: NPRS scale: 6/10 Pain location: lower leftneck Pain description: sore, heavy headed Aggravating factors: upright Relieving factors: reclined, rest  PRECAUTIONS: Fall  RED FLAGS: None   WEIGHT BEARING RESTRICTIONS:  No  FALLS: Has patient fallen in last 6 months? Yes. Number of falls 7  LIVING ENVIRONMENT: Lives with: lives with their family and lives with their spouse Lives in:  House/apartment Stairs: Yes, exterior: 4 steps; Ground-floor set-up Has following equipment at home: None  PLOF: Independent with basic ADLs  PATIENT GOALS: improve balance  OBJECTIVE:     TODAY'S TREATMENT: 07/07/23 Activity Comments  Vitals at start of session 96% spO2, 99/23mmHg, 86 bpm   Pt denies dizziness in sitting. Performed sitting march, LAQ, ankle pumps prior to standing up   Pre: RPE 6-7/20   Post: 85-95% spO2, , 100bpm, 119/71 mmHg; RPE 16/20   Patient completed 795 feet without AD. Required sit break at 5 min 30 sec  Palpation of neck  Soft tissue restriction and TTP in L>R UT, rhomboids, cervical paraspinals         PATIENT EDUCATION: Education details: educated pt on conservative measures to address orthostatics (see pt instructions), edu on test results and update to goals; edu and demo on self-massage with tennis ball against wall to address muscular pain and tightness  Person educated: Patient Education method: Explanation, Demonstration, Tactile cues, Verbal cues, and Handouts Education comprehension: verbalized understanding and returned demonstration      Note: Objective measures were completed at Evaluation unless otherwise noted.  DIAGNOSTIC FINDINGS:   Vitals: 107/67 mmHg, 91 bpm  COGNITION: Overall cognitive status: Within functional limits for tasks assessed   SENSATION: Not tested, reports hx of neuropathy in fingers  COORDINATION: Difficulty to rapid alternating movement Heel to shin WNL  EDEMA:  None at present, currently daily weights for CHF  MUSCLE TONE: no hypertonicity noted  MUSCLE LENGTH: WNL  DTRs:  NT  POSTURE: rounded shoulders and forward head  LOWER EXTREMITY ROM:     Active  Right Eval Left Eval  Hip flexion    Hip extension     Hip abduction    Hip adduction    Hip internal rotation    Hip external rotation    Knee flexion    Knee extension    Ankle dorsiflexion    Ankle plantarflexion    Ankle inversion    Ankle eversion     (Blank rows = not tested)  LOWER EXTREMITY MMT:    MMT Right Eval Left Eval  Hip flexion    Hip extension    Hip abduction    Hip adduction    Hip internal rotation    Hip external rotation    Knee flexion    Knee extension    Ankle dorsiflexion    Ankle plantarflexion    Ankle inversion    Ankle eversion    (Blank rows = not tested)  BED MOBILITY:  Independent  TRANSFERS: Independent with sit-stand and chair-chair Floor to stand: reports need for physical assistance after falling or using furniture if intentionally getting on to ground and up again  STAIRS: TBD GAIT: Findings: Comments: ambulates independently level surfaces, no AD in past  FUNCTIONAL TESTS:  Mini-BESTest: TBD Berg Balance Test: 45/56 : TBD  PATIENT SURVEYS:  Freezing of gait questionnaire TBD  TREATMENT DATE:     PATIENT EDUCATION: Education details: assessment details Person educated: Patient and Spouse Education method: Explanation Education comprehension: verbalized understanding  HOME EXERCISE PROGRAM: TBD  GOALS: Goals reviewed with patient? Yes  SHORT TERM GOALS: Target date: 07/21/2023    Patient will be independent in HEP to improve functional outcomes Baseline: Goal status: INITIAL  2.  Demo improved BLE strength and balance per time < 15 sec 5xSTS test Baseline: 20 sec Goal status: INITIAL  3.  Demo improved static balance and reduced risk for falls per score 50/56 Berg Balance Test Baseline: 45/56 Goal status: INITIAL    LONG TERM GOALS: Target date: 08/11/2023    Demo improved mobility and reduced risk for falls per score  24/28 Mini-BESTest Baseline: 15/28 Goal status: INITIAL  2.  Demo reduced risk for falls per time 4.8 sec Baseline: 8.05 sec Goal status: INITIAL  3.  Patient to ambulate 1100 feet without AD during without lightheadedness.  Baseline: 795 feet without AD. Required sit break at 5 min 30 sec 07/07/23 Goal status: IN PROGRESS 07/07/23  4.  Teach back relevant programs/activities for fitness/exercise for those w/ PD to improve carryover at D/C Baseline:  Goal status: INITIAL   ASSESSMENT:  CLINICAL IMPRESSION: Patient arrived to session with report of questions concerning orthostasis management; this was thoroughly addressed. Patient also reports neck pain since his fall. Patient completed 795 feet with today, limited by lightheadedness towards end of test. This resolved with sitting rest break; BP responded appropriately to activity however spO2 appeared to very briefly drop. Provided edu on addressing neck pain with gentle self-massage. Patient reported understanding of all edu provided today and without complaints upon leaving.   OBJECTIVE IMPAIRMENTS: Abnormal gait, decreased activity tolerance, decreased balance, decreased coordination, decreased endurance, decreased knowledge of use of DME, difficulty walking, decreased strength, dizziness, and postural dysfunction.   ACTIVITY LIMITATIONS: carrying, lifting, bending, squatting, transfers, reach over head, and locomotion level  PARTICIPATION LIMITATIONS: meal prep, cleaning, laundry, interpersonal relationship, shopping, community activity, and yard work  PERSONAL FACTORS: Age, Time since onset of injury/illness/exacerbation, and 3+ comorbidities: PMH are also affecting patient's functional outcome.   REHAB POTENTIAL: Good  CLINICAL DECISION MAKING: Evolving/moderate complexity  EVALUATION COMPLEXITY: Moderate  PLAN:  PT FREQUENCY: 1-2x/week  PT DURATION: 6 weeks  PLANNED INTERVENTIONS: 97750- Physical  Performance Testing, 97110-Therapeutic exercises, 97530- Therapeutic activity, [id number]- Neuromuscular re-education, 97535- Self Care, 16109- Manual therapy, [phone]- Gait training, [phone]- Orthotic Initial, [phone]- Canalith repositioning, and [phone]- Aquatic Therapy  PLAN FOR NEXT SESSION: progress endurance and stamina while monitoring BP     Thaddeus Filippo, PT, DPT 07/07/23 3:35 PM  [facility] at [facility] [address] [place], [place] [address] Phone # [phone] Fax # [phone]

## 2023-07-05 ENCOUNTER — Ambulatory Visit
Admission: RE | Admit: 2023-07-05 | Discharge: 2023-07-05 | Disposition: A | Discharge status: Home / Self Care | Source: Ambulatory Visit | Attending: Adult Health | Admitting: Adult Health

## 2023-07-05 DIAGNOSIS — I10 Essential (primary) hypertension: Secondary | ICD-10-CM | POA: Diagnosis not present

## 2023-07-05 DIAGNOSIS — R42 Dizziness and giddiness: Secondary | ICD-10-CM

## 2023-07-05 DIAGNOSIS — R55 Syncope and collapse: Secondary | ICD-10-CM | POA: Diagnosis not present

## 2023-07-05 DIAGNOSIS — G20A1 Parkinson's disease without dyskinesia, without mention of fluctuations: Secondary | ICD-10-CM | POA: Diagnosis not present

## 2023-07-07 ENCOUNTER — Ambulatory Visit: Payer: Self-pay | Admitting: Adult Health

## 2023-07-07 ENCOUNTER — Encounter: Payer: Self-pay | Admitting: Physical Therapy

## 2023-07-07 ENCOUNTER — Ambulatory Visit: Admitting: Physical Therapy

## 2023-07-07 DIAGNOSIS — M6281 Muscle weakness (generalized): Secondary | ICD-10-CM

## 2023-07-07 DIAGNOSIS — R262 Difficulty in walking, not elsewhere classified: Secondary | ICD-10-CM

## 2023-07-07 DIAGNOSIS — R2689 Other abnormalities of gait and mobility: Secondary | ICD-10-CM

## 2023-07-07 DIAGNOSIS — R2681 Unsteadiness on feet: Secondary | ICD-10-CM

## 2023-07-07 NOTE — Patient Instructions (Addendum)
 Tips to address Orthostatic Hypotension:  Wear compression socks during the day Follow recommendations from your doctors on fluid and salt intake March in place or pump your ankles up and down before you stand or sit up about bed Look into purchasing an abdominal binder

## 2023-07-07 NOTE — Telephone Encounter (Signed)
 CPAP HTA Siegfried Dress: [id number] (exp. 06/26/23 to 09/24/23)

## 2023-07-07 NOTE — Progress Notes (Signed)
 OFFICE NOTE:    Date:  07/08/2023  ID:  Anthony [name], DOB 1945-11-29, MRN [id number] PCP: Suzzanne Estrin, MD  [facility] Providers Cardiologist:  Cody Das, MD       Patient Profile:  Coronary artery disease  S/p POBA to dist LAD in 08/2021 LHC 08/28/2021: LAD proximal-mid 50, distal 75; LCx proximal-mid 50; RI proximal 50; RCA proximal-distal 30 MPI 12/24/2021: Normal perfusion, low risk (HFpEF) heart failure with preserved ejection fraction  TTE 06/21/2023: EF >75, no RWMA, mild LVH, mildly reduced RVSF, mild AS (mean 1.3, V-max 218.67 cm/s, DI 0.57) Aortic stenosis TTE 05/2023: Mild Carotid US  10/19/2021: No ICA stenosis bilaterally Hypertension  Hyperlipidemia  Diabetes mellitus  OSA on CPAP Parkinsonism        Discussed the use of AI scribe software for clinical note transcription with the patient, who gave verbal consent to proceed. History of Present Illness Anthony [name] is a 78 y.o. male who returns for post hospital follow up. He was admitted 5/30-6/3 with a/c CHF. He was diuresed with IV Lasix . TTE showed hyperdynamic LVF. Of note, his Imdur  dose was reduced due to low BP, near syncope.  The patient did have lower extremity Dopplers ordered prior to admission.  These were not done during admission and the plan was to obtain these as an outpatient.  Lower extremity Dopplers have not been.  He is here with his wife. Since DC, he continues to note dizziness. He experiences dizziness, particularly when standing up, leading to multiple falls. The dizziness is described as a sensation of spinning and lightheadedness, worsening the longer he sits before standing. No loss of consciousness has occurred, but he notes difficulty with direction and balance when dizzy. He has a history of orthostatic hypotension. However, recent ortho VS did not show significant BP drop with standing. He recently had a brain MRA with neurology to investigate his  symptoms.  His breathing remains improved since admission to the hospital. His leg swelling has decreased significantly, and he has been on a low sodium diet. His weight has decreased from 240 lbs to 208 lbs. He reports occasional chest tightness, particularly when preparing for bed, which he associates with fluid retention. This symptom has resolved since his recent hospital discharge.    ROS-See HPI    Studies Reviewed:       Risk Assessment/Calculations:          Physical Exam:  VS:  BP (!) 97/59   Pulse 76   Ht 5' 10.5 (1.791 m)   Wt 211 lb 9.6 oz (96 kg)   SpO2 98%   BMI 29.93 kg/m    Wt Readings from Last 3 Encounters:  07/08/23 211 lb 9.6 oz (96 kg)  06/24/23 211 lb 12.8 oz (96.1 kg)  06/20/23 231 lb 9.6 oz (105.1 kg)    Constitutional:      Appearance: Healthy appearance. Not in distress.  Neck:     Vascular: No JVR.  Pulmonary:     Breath sounds: Normal breath sounds. No wheezing. No rales.  Cardiovascular:     Normal rate. Regular rhythm.     Murmurs: There is a grade 1/6 systolic murmur at the URSB.  Edema:    Peripheral edema absent.  Abdominal:     Palpations: Abdomen is soft.  Skin:    General: Skin is warm and dry.        Assessment and Plan:    Assessment & Plan Chronic heart  failure with preserved ejection fraction [facility]) Recent admission for acute on chronic CHF.  Echocardiogram demonstrated EF >75.  He had significant improvement with diuresis.  He was -5.1 L in the hospital.  He lost about 30 pounds.  Since discharge, his PCP did decrease his furosemide  due to low blood pressure.  Currently volume status seems to be stable.  Of note, it was recommended he have lower extremity Dopplers prior to admission because of leg swelling.  He has no leg swelling on exam today.  Therefore I do not think he needs venous Dopplers. - Continue Lasix  80 mg in the morning, 40 mg in the evening - Continue Jardiance  25 mg daily as use for diabetes - Would hold off  on MRA unless he has hypokalemia or worsening blood pressure  - BMET today Mild aortic stenosis Mild aortic stenosis on echocardiogram, asymptomatic. Monitor with echocardiogram every 2-3 years. Near syncope He has been experiencing dizziness and lightheadedness.  I reviewed his chart.  He did not have significant drops in his blood pressure consistent with orthostatic hypotension.  Etiology for his dizziness is multifactorial.  He is hypotensive today.  Parkinson's may be playing a role with some of his symptoms.  He is undergoing neurologic evaluation as well.  It was mentioned previously to consider cardiac monitor if he continues to have symptoms.  This seems reasonable to rule out the possibility of arrhythmia. - Reduce isosorbide  to 15 mg daily given low blood pressure - Check BMET today - Order ZIO monitor to rule out arrhythmia. - Continue compression stockings. Coronary artery disease involving native coronary artery of native heart without angina pectoris Status post balloon angioplasty to distal LAD in August 2023.  Nuclear stress test in December 2023 low risk.  He did have some anginal symptoms when he was volume overloaded.  These have resolved. - Continue aspirin  81 mg daily, Imdur  at reduced dose 15 mg daily, Crestor  20 mg daily.        Dispo:  Return in about 8 weeks (around 09/02/2023) for Routine Follow Up, w/ Dr. Filiberto Hug, or Marlyse Single, PA-C.  Signed, Marlyse Single, PA-C

## 2023-07-08 ENCOUNTER — Ambulatory Visit (HOSPITAL_COMMUNITY): Admission: RE | Admit: 2023-07-08 | Source: Ambulatory Visit

## 2023-07-08 ENCOUNTER — Ambulatory Visit (INDEPENDENT_AMBULATORY_CARE_PROVIDER_SITE_OTHER): Admitting: Physician Assistant

## 2023-07-08 ENCOUNTER — Ambulatory Visit (HOSPITAL_COMMUNITY)
Admission: RE | Admit: 2023-07-08 | Discharge: 2023-07-08 | Disposition: A | Discharge status: Home / Self Care | Source: Ambulatory Visit | Attending: Internal Medicine | Admitting: Internal Medicine

## 2023-07-08 ENCOUNTER — Ambulatory Visit
Admission: RE | Admit: 2023-07-08 | Discharge: 2023-07-08 | Disposition: A | Discharge status: Home / Self Care | Source: Ambulatory Visit | Attending: Neurology | Admitting: Neurology

## 2023-07-08 ENCOUNTER — Other Ambulatory Visit (HOSPITAL_COMMUNITY): Payer: Self-pay | Admitting: Adult Health

## 2023-07-08 ENCOUNTER — Encounter: Payer: Self-pay | Admitting: Physician Assistant

## 2023-07-08 ENCOUNTER — Ambulatory Visit

## 2023-07-08 VITALS — BP 97/59 | HR 76 | Ht 70.5 in | Wt 211.6 lb

## 2023-07-08 DIAGNOSIS — R55 Syncope and collapse: Secondary | ICD-10-CM

## 2023-07-08 DIAGNOSIS — G20A1 Parkinson's disease without dyskinesia, without mention of fluctuations: Secondary | ICD-10-CM | POA: Diagnosis not present

## 2023-07-08 DIAGNOSIS — I350 Nonrheumatic aortic (valve) stenosis: Secondary | ICD-10-CM

## 2023-07-08 DIAGNOSIS — I110 Hypertensive heart disease with heart failure: Secondary | ICD-10-CM | POA: Diagnosis not present

## 2023-07-08 DIAGNOSIS — R1319 Other dysphagia: Secondary | ICD-10-CM | POA: Insufficient documentation

## 2023-07-08 DIAGNOSIS — R1310 Dysphagia, unspecified: Secondary | ICD-10-CM | POA: Diagnosis present

## 2023-07-08 DIAGNOSIS — E119 Type 2 diabetes mellitus without complications: Secondary | ICD-10-CM | POA: Insufficient documentation

## 2023-07-08 DIAGNOSIS — Z7982 Long term (current) use of aspirin: Secondary | ICD-10-CM | POA: Diagnosis not present

## 2023-07-08 DIAGNOSIS — I5032 Chronic diastolic (congestive) heart failure: Secondary | ICD-10-CM

## 2023-07-08 DIAGNOSIS — R059 Cough, unspecified: Secondary | ICD-10-CM | POA: Insufficient documentation

## 2023-07-08 DIAGNOSIS — G4733 Obstructive sleep apnea (adult) (pediatric): Secondary | ICD-10-CM | POA: Diagnosis not present

## 2023-07-08 DIAGNOSIS — Z79899 Other long term (current) drug therapy: Secondary | ICD-10-CM | POA: Diagnosis not present

## 2023-07-08 DIAGNOSIS — Z7984 Long term (current) use of oral hypoglycemic drugs: Secondary | ICD-10-CM | POA: Diagnosis not present

## 2023-07-08 DIAGNOSIS — I2510 Atherosclerotic heart disease of native coronary artery without angina pectoris: Secondary | ICD-10-CM

## 2023-07-08 MED ORDER — ISOSORBIDE MONONITRATE ER 30 MG PO TB24: 15.0000 mg | ORAL_TABLET | Freq: Every day | ORAL | Status: DC

## 2023-07-08 NOTE — Assessment & Plan Note (Signed)
 Mild aortic stenosis on echocardiogram, asymptomatic. Monitor with echocardiogram every 2-3 years.

## 2023-07-08 NOTE — Progress Notes (Unsigned)
 Enrolled patient for a 14 day Zio XT monitor to be mailed to patients home  Patwardhan to read

## 2023-07-08 NOTE — Assessment & Plan Note (Signed)
 Recent admission for acute on chronic CHF.  Echocardiogram demonstrated EF >75.  He had significant improvement with diuresis.  He was -5.1 L in the hospital.  He lost about 30 pounds.  Since discharge, his PCP did decrease his furosemide  due to low blood pressure.  Currently volume status seems to be stable.  Of note, it was recommended he have lower extremity Dopplers prior to admission because of leg swelling.  He has no leg swelling on exam today.  Therefore I do not think he needs venous Dopplers. - Continue Lasix  80 mg in the morning, 40 mg in the evening - Continue Jardiance  25 mg daily as use for diabetes - Would hold off on MRA unless he has hypokalemia or worsening blood pressure  - BMET today

## 2023-07-08 NOTE — Assessment & Plan Note (Signed)
 Status post balloon angioplasty to distal LAD in August 2023.  Nuclear stress test in December 2023 low risk.  He did have some anginal symptoms when he was volume overloaded.  These have resolved. - Continue aspirin  81 mg daily, Imdur  at reduced dose 15 mg daily, Crestor  20 mg daily.

## 2023-07-08 NOTE — Patient Instructions (Signed)
 Medication Instructions:  Your physician has recommended you make the following change in your medication:   REDUCE the Isosorbide  to 30 mg taking 1/2 daily  *If you need a refill on your cardiac medications before your next appointment, please call your pharmacy*  Lab Work: TODAY:  BMET  If you have labs (blood work) drawn today and your tests are completely normal, you will receive your results only by: MyChart Message (if you have MyChart) OR A paper copy in the mail If you have any lab test that is abnormal or we need to change your treatment, we will call you to review the results.  Testing/Procedures: Delane Fear- Long Term Monitor Instructions  Your physician has requested you wear a ZIO patch monitor for 14 days.  This is a single patch monitor. [employer] supplies one patch monitor per enrollment. Additional stickers are not available. Please do not apply patch if you will be having a Nuclear Stress Test,  Echocardiogram, Cardiac CT, MRI, or Chest Xray during the period you would be wearing the  monitor. The patch cannot be worn during these tests. You cannot remove and re-apply the  ZIO XT patch monitor.  Your ZIO patch monitor will be mailed 3 day USPS to your address on file. It may take 3-5 days  to receive your monitor after you have been enrolled.  Once you have received your monitor, please review the enclosed instructions. Your monitor  has already been registered assigning a specific monitor serial # to you.  Billing and Patient Assistance Program Information  We have supplied [employer] with any of your insurance information on file for billing purposes. [employer] offers a sliding scale Patient Assistance Program for patients that do not have  insurance, or whose insurance does not completely cover the cost of the ZIO monitor.  You must apply for the Patient Assistance Program to qualify for this discounted rate.  To apply, please call [employer] at [phone], select option 4,  select option 2, ask to apply for  Patient Assistance Program. Sanna Crystal will ask your household income, and how many people  are in your household. They will quote your out-of-pocket cost based on that information.  [employer] will also be able to set up a 92-month, interest-free payment plan if needed.  Applying the monitor   Shave hair from upper left chest.  Hold abrader disc by orange tab. Rub abrader in 40 strokes over the upper left chest as  indicated in your monitor instructions.  Clean area with 4 enclosed alcohol  pads. Let dry.  Apply patch as indicated in monitor instructions. Patch will be placed under collarbone on left  side of chest with arrow pointing upward.  Rub patch adhesive wings for 2 minutes. Remove white label marked 1. Remove the white  label marked 2. Rub patch adhesive wings for 2 additional minutes.  While looking in a mirror, press and release button in center of patch. A small green light will  flash 3-4 times. This will be your only indicator that the monitor has been turned on.  Do not shower for the first 24 hours. You may shower after the first 24 hours.  Press the button if you feel a symptom. You will hear a small click. Record Date, Time and  Symptom in the Patient Logbook.  When you are ready to remove the patch, follow instructions on the last 2 pages of Patient  Logbook. Stick patch monitor onto the last page of Patient Logbook.  Place Patient Logbook  in the blue and white box. Use locking tab on box and tape box closed  securely. The blue and white box has prepaid postage on it. Please place it in the mailbox as  soon as possible. Your physician should have your test results approximately 7 days after the  monitor has been mailed back to [facility].  Call [facility] Customer Care at [phone] if you have questions regarding  your ZIO XT patch monitor. Call them immediately if you see an orange light blinking on your  monitor.  If your  monitor falls off in less than 4 days, contact our Monitor department at [phone].  If your monitor becomes loose or falls off after 4 days call [employer] at [phone] for  suggestions on securing your monitor   Follow-Up: At [facility], you and your health needs are our priority.  As part of our continuing mission to provide you with exceptional heart care, our providers are all part of one team.  This team includes your primary Cardiologist (physician) and Advanced Practice Providers or APPs (Physician Assistants and Nurse Practitioners) who all work together to provide you with the care you need, when you need it.  Your next appointment:   6-8 week(s)  Provider:   Cody Das, MD or Marlyse Single, PA-C          We recommend signing up for the patient portal called MyChart.  Sign up information is provided on this After Visit Summary.  MyChart is used to connect with patients for Virtual Visits (Telemedicine).  Patients are able to view lab/test results, encounter notes, upcoming appointments, etc.  Non-urgent messages can be sent to your provider as well.   To learn more about what you can do with MyChart, go to [email or web address].   Other Instructions

## 2023-07-09 ENCOUNTER — Ambulatory Visit

## 2023-07-09 ENCOUNTER — Ambulatory Visit: Payer: Self-pay | Admitting: Physician Assistant

## 2023-07-09 DIAGNOSIS — R55 Syncope and collapse: Secondary | ICD-10-CM

## 2023-07-09 DIAGNOSIS — R2689 Other abnormalities of gait and mobility: Secondary | ICD-10-CM

## 2023-07-09 DIAGNOSIS — N179 Acute kidney failure, unspecified: Secondary | ICD-10-CM

## 2023-07-09 DIAGNOSIS — R262 Difficulty in walking, not elsewhere classified: Secondary | ICD-10-CM

## 2023-07-09 DIAGNOSIS — I5032 Chronic diastolic (congestive) heart failure: Secondary | ICD-10-CM

## 2023-07-09 DIAGNOSIS — M6281 Muscle weakness (generalized): Secondary | ICD-10-CM

## 2023-07-09 DIAGNOSIS — M542 Cervicalgia: Secondary | ICD-10-CM

## 2023-07-09 DIAGNOSIS — R2681 Unsteadiness on feet: Secondary | ICD-10-CM

## 2023-07-09 LAB — BASIC METABOLIC PANEL WITH GFR
BUN/Creatinine Ratio: 17 (ref 10–24)
BUN: 25 mg/dL (ref 8–27)
CO2: 27 mmol/L (ref 20–29)
Calcium: 10 mg/dL (ref 8.6–10.2)
Chloride: 94 mmol/L — ABNORMAL LOW (ref 96–106)
Creatinine, Ser: 1.43 mg/dL — ABNORMAL HIGH (ref 0.76–1.27)
Glucose: 109 mg/dL — ABNORMAL HIGH (ref 70–99)
Potassium: 4.7 mmol/L (ref 3.5–5.2)
Sodium: 137 mmol/L (ref 134–144)
eGFR: 50 mL/min/{1.73_m2} — ABNORMAL LOW (ref >59)

## 2023-07-09 MED ORDER — FUROSEMIDE 80 MG PO TABS: 40.0000 mg | ORAL_TABLET | Freq: Two times a day (BID) | ORAL | 3 refills | Status: DC

## 2023-07-09 NOTE — Therapy (Signed)
 OUTPATIENT PHYSICAL THERAPY NEURO TREATMENT   Patient Name: Anthony [name] MRN: [id number] DOB:[birth year], 78 y.o., male Today's Date: 07/09/2023   PCP: Dohmeier, Raoul Byes REFERRING PROVIDER: Jiles Mote, PA-C  END OF SESSION:  PT End of Session - 07/09/23 1452     Visit Number 4    Number of Visits 13    Date for PT Re-Evaluation 08/11/23    Authorization Type [employer]    Progress Note Due on Visit 10    PT Start Time 1445    PT Stop Time 1530    PT Time Calculation (min) 45 min    Equipment Utilized During Treatment Gait belt    Activity Tolerance Patient tolerated treatment well;Other (comment)   lightheadedness   Behavior During Therapy WFL for tasks assessed/performed           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   Neuromuscular disorder (HCC) 2023   Parkinson's Disease   Neuropathy    left leg greater than right leg   Obesity (BMI 30.0-34.9) 05/14/2013   OSA on CPAP    cpap setting of 10/ 13   Pancreatitis dx march 2016   Pneumonia 12 years ago   Past Surgical History:  Procedure Laterality Date   ANTERIOR CERVICAL DECOMP/DISCECTOMY FUSION N/A 08/02/2022   Procedure: Anterior Cervical Decompression/Discectomy Fusion - Cervical Three-Cervical Four,  Cervical Four-Cervical Five,  remove Cervical Five-Cervical Six Plate;  Surgeon: Joaquin Mulberry, MD;  Location: [facility] OR;  Service: Neurosurgery;  Laterality: N/A;  3C   BACK SURGERY  10/2009   Cervical, arterior   CARPAL TUNNEL RELEASE Left  2003   CARPAL TUNNEL RELEASE Bilateral    CATARACT EXTRACTION Bilateral 01/2012   COLONOSCOPY  2024   CORONARY BALLOON ANGIOPLASTY N/A 08/28/2021   Procedure: CORONARY BALLOON ANGIOPLASTY;  Surgeon: Cody Das, MD;  Location: [facility] INVASIVE CV LAB;  Service: Cardiovascular;  Laterality: N/A;   EUS N/A 07/15/2014   Procedure: FULL UPPER ENDOSCOPIC ULTRASOUND (EUS) RADIAL;  Surgeon: Alvis Jourdain, MD;  Location: [facility] ENDOSCOPY;  Service: Endoscopy;  Laterality: N/A;   LEFT HEART CATH AND CORONARY ANGIOGRAPHY N/A 05/18/2019   Procedure: LEFT HEART CATH AND CORONARY ANGIOGRAPHY;  Surgeon: Knox Perl, MD;  Location: [facility] INVASIVE CV LAB;  Service: Cardiovascular;  Laterality: N/A;   LEFT HEART CATH AND CORONARY ANGIOGRAPHY N/A 08/28/2021   Procedure: LEFT HEART CATH AND CORONARY ANGIOGRAPHY;  Surgeon: Cody Das, MD;  Location: [facility] INVASIVE CV LAB;  Service: Cardiovascular;  Laterality: N/A;   NASAL SINUS SURGERY  1981   RETINAL Bilateral 06/2013   Retinal peel   SHOULDER SURGERY Right 2003   TONSILLECTOMY  1954   Patient Active Problem List   Diagnosis Date Noted   (HFpEF) heart failure with preserved ejection fraction (HCC) 06/24/2023   Mild aortic stenosis 06/24/2023   Hypothyroidism 06/24/2023   Heart failure (HCC) 06/20/2023  Acute right-sided congestive heart failure (HCC) 06/20/2023   Shortness of breath 06/20/2023   Nonrheumatic aortic valve stenosis 02/07/2023   Hypersomnia, persistent 12/26/2022   Nasal congestion 12/26/2022   Post traumatic stress disorder (PTSD) 12/26/2022   S/P cervical spinal fusion 08/02/2022   Parkinsonism (HCC) 12/06/2021   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/06/2021   Hyperkalemia 12/06/2020   CAD (coronary artery disease) 12/03/2019   Esophageal dysphagia 11/21/2019   Gastroesophageal reflux disease 11/21/2019   Family history of colon cancer 11/21/2019   Abnormal stress test 05/11/2019   Mixed hyperlipidemia 05/11/2019    Angina pectoris (HCC) 08/07/2018   Essential hypertension 08/07/2018   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, with long-term current use of insulin  (HCC) 08/07/2018   Morbid obesity (HCC) 08/28/2017   Neuropathy 08/28/2017   Numbness and tingling of both legs below knees 11/10/2013   OSA on CPAP 05/14/2013   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   Obesity (BMI 30.0-34.9) 05/14/2013    ONSET DATE: worse over past several weeks  REFERRING DIAG: R26.89 (ICD-10-CM) - Other abnormalities of gait and mobility  THERAPY DIAG:  Other abnormalities of gait and mobility  Unsteadiness on feet  Muscle weakness (generalized)  Difficulty in walking, not elsewhere classified  Cervicalgia  Rationale for Evaluation and Treatment: Rehabilitation  SUBJECTIVE:                                                                                                                                                                                             SUBJECTIVE STATEMENT: Still having instances lightheadedness, remain on fluid restrictions. Neck is hurting more today  Pt accompanied by: significant other  PERTINENT HISTORY: hypertension, hyperlipidemia, CAD, type 2 DM, mild AS, OSA on CPAP, hypothyroidism, Parkinson's disease, GERD.  PAIN:  Are you having pain? Yes: NPRS scale: 6/10 Pain location: lower leftneck Pain description: sore, heavy headed Aggravating factors: upright Relieving factors: reclined, rest  PRECAUTIONS: Fall  RED FLAGS: None   WEIGHT BEARING RESTRICTIONS: No  FALLS: Has patient fallen in last 6 months? Yes. Number of falls 7  LIVING ENVIRONMENT: Lives with: lives with their family and lives with their spouse Lives in: House/apartment Stairs: Yes, exterior: 4 steps; Ground-floor set-up Has following equipment at home: None  PLOF: Independent with basic ADLs  PATIENT GOALS: improve balance  OBJECTIVE:   TODAY'S TREATMENT:  07/09/23 Activity Comments  NU-step level 5 x 10 min MHP to neck, LE only cycling  Step-up w/ knee drive 1O10 8 BUE support, difficulty w/ right knee extension from OA  Lateral step-up 1x10 8, single UE support  Alt stair taps x 2 min 8, 5#  Sidestepping x 2 min 5#, UE support  Hip abduction 2x10 5#  Multisensory balance On foam: EO/EC, postural perturbations, lateral/ant-post sway x 60 sec      TODAY'S TREATMENT: 07/07/23 Activity Comments  Vitals at start of session 96% spO2, 99/45mmHg, 86 bpm   Pt denies dizziness in sitting. Performed sitting march, LAQ, ankle pumps prior to standing up   Pre: RPE 6-7/20   Post: 85-95% spO2, , 100bpm, 119/71 mmHg; RPE 16/20   Patient completed 795 feet without AD. Required sit break at 5 min 30 sec  Palpation of neck  Soft tissue restriction and TTP in L>R UT, rhomboids, cervical paraspinals         PATIENT EDUCATION: Education details: educated pt on conservative measures to address orthostatics (see pt instructions), edu on test results and update to goals; edu and demo on self-massage with tennis ball against wall to address muscular pain and tightness  Person educated: Patient Education method: Explanation, Demonstration, Tactile cues, Verbal cues, and Handouts Education comprehension: verbalized understanding and returned demonstration      Note: Objective measures were completed at Evaluation unless otherwise noted.  DIAGNOSTIC FINDINGS:   Vitals: 107/67 mmHg, 91 bpm  COGNITION: Overall cognitive status: Within functional limits for tasks assessed   SENSATION: Not tested, reports hx of neuropathy in fingers  COORDINATION: Difficulty to rapid alternating movement Heel to shin WNL  EDEMA:  None at present, currently daily weights for CHF  MUSCLE TONE: no hypertonicity noted  MUSCLE LENGTH: WNL  DTRs:  NT  POSTURE: rounded shoulders and forward head  LOWER EXTREMITY ROM:     Active  Right Eval  Left Eval  Hip flexion    Hip extension    Hip abduction    Hip adduction    Hip internal rotation    Hip external rotation    Knee flexion    Knee extension    Ankle dorsiflexion    Ankle plantarflexion    Ankle inversion    Ankle eversion     (Blank rows = not tested)  LOWER EXTREMITY MMT:    MMT Right Eval Left Eval  Hip flexion    Hip extension    Hip abduction    Hip adduction    Hip internal rotation    Hip external rotation    Knee flexion    Knee extension    Ankle dorsiflexion    Ankle plantarflexion    Ankle inversion    Ankle eversion    (Blank rows = not tested)  BED MOBILITY:  Independent  TRANSFERS: Independent with sit-stand and chair-chair Floor to stand: reports need for physical assistance after falling or using furniture if intentionally getting on to ground and up again  STAIRS: TBD GAIT: Findings: Comments: ambulates independently level surfaces, no AD in past  FUNCTIONAL TESTS:  Mini-BESTest: TBD Berg Balance Test: 45/56 : TBD  PATIENT SURVEYS:  Freezing of gait questionnaire TBD  TREATMENT DATE:     PATIENT EDUCATION: Education details: assessment details Person educated: Patient and Spouse Education method: Explanation Education comprehension: verbalized understanding  HOME EXERCISE PROGRAM: TBD  GOALS: Goals reviewed with patient? Yes  SHORT TERM GOALS: Target date: 07/21/2023    Patient will be independent in HEP to improve functional outcomes Baseline: Goal status: IN PROGRESS  2.  Demo improved BLE strength and balance per time < 15 sec 5xSTS test Baseline: 20 sec Goal status: IN PROGRESS  3.  Demo improved static balance and reduced risk for falls per score 50/56 Berg Balance Test Baseline: 45/56 Goal status: IN PROGRESS    LONG TERM GOALS: Target date: 08/11/2023    Demo  improved mobility and reduced risk for falls per score 24/28 Mini-BESTest Baseline: 15/28 Goal status: INITIAL  2.  Demo reduced risk for falls per time 4.8 sec Baseline: 8.05 sec Goal status: INITIAL  3.  Patient to ambulate 1100 feet without AD during without lightheadedness.  Baseline: 795 feet without AD. Required sit break at 5 min 30 sec 07/07/23 Goal status: IN PROGRESS 07/07/23  4.  Teach back relevant programs/activities for fitness/exercise for those w/ PD to improve carryover at D/C Baseline:  Goal status: INITIAL   ASSESSMENT:  CLINICAL IMPRESSION: Session w/ focus on improving endurance to improve functional activity tolerance with emphasis on resistance training under time-based constraints for greater activity output. Balance training to facilitate strategies for anticipatory and reactive response to improve speed/efficiency of righting reactions to reduce risk for falls during mobility. Continued sessions to meet POC objectives  OBJECTIVE IMPAIRMENTS: Abnormal gait, decreased activity tolerance, decreased balance, decreased coordination, decreased endurance, decreased knowledge of use of DME, difficulty walking, decreased strength, dizziness, and postural dysfunction.   ACTIVITY LIMITATIONS: carrying, lifting, bending, squatting, transfers, reach over head, and locomotion level  PARTICIPATION LIMITATIONS: meal prep, cleaning, laundry, interpersonal relationship, shopping, community activity, and yard work  PERSONAL FACTORS: Age, Time since onset of injury/illness/exacerbation, and 3+ comorbidities: PMH are also affecting patient's functional outcome.   REHAB POTENTIAL: Good  CLINICAL DECISION MAKING: Evolving/moderate complexity  EVALUATION COMPLEXITY: Moderate  PLAN:  PT FREQUENCY: 1-2x/week  PT DURATION: 6 weeks  PLANNED INTERVENTIONS: 97750- Physical Performance Testing, 97110-Therapeutic exercises, 97530- Therapeutic activity, [id number]-  Neuromuscular re-education, 97535- Self Care, [id number]- Manual therapy, [id number]- Gait training, [phone]- Orthotic Initial, [phone]- Canalith repositioning, and [phone]- Aquatic Therapy  PLAN FOR NEXT SESSION: progress endurance and stamina while monitoring BP    3:46 PM, 07/09/23 M. Kelly Rhyse Loux, PT, DPT Physical Therapist- [place] Office Number: [phone]

## 2023-07-10 ENCOUNTER — Ambulatory Visit: Payer: Self-pay | Admitting: Adult Health

## 2023-07-14 ENCOUNTER — Ambulatory Visit

## 2023-07-14 DIAGNOSIS — R262 Difficulty in walking, not elsewhere classified: Secondary | ICD-10-CM

## 2023-07-14 DIAGNOSIS — R2689 Other abnormalities of gait and mobility: Secondary | ICD-10-CM

## 2023-07-14 DIAGNOSIS — M6281 Muscle weakness (generalized): Secondary | ICD-10-CM

## 2023-07-14 DIAGNOSIS — R2681 Unsteadiness on feet: Secondary | ICD-10-CM

## 2023-07-14 NOTE — Therapy (Signed)
 OUTPATIENT PHYSICAL THERAPY NEURO TREATMENT   Patient Name: Anthony [name] MRN: [id number] DOB:05/04/1945, 78 y.o., male Today's Date: 07/14/2023   PCP: Dohmeier, Dedra REFERRING PROVIDER: Natalia Waddell LABOR, PA-C  END OF SESSION:  PT End of Session - 07/14/23 1450     Visit Number 5    Number of Visits 13    Date for PT Re-Evaluation 08/11/23    Authorization Type [employer]    Progress Note Due on Visit 10    PT Start Time 1450    PT Stop Time 1530    PT Time Calculation (min) 40 min    Equipment Utilized During Treatment Gait belt    Activity Tolerance Patient tolerated treatment well;Other (comment)   lightheadedness   Behavior During Therapy WFL for tasks assessed/performed           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   Neuromuscular disorder (HCC) 2023   Parkinson's Disease   Neuropathy    left leg greater than right leg   Obesity (BMI 30.0-34.9) 05/14/2013   OSA on CPAP    cpap setting of 10/ 13   Pancreatitis dx march 2016   Pneumonia 12 years ago   Past Surgical History:  Procedure Laterality Date   ANTERIOR CERVICAL DECOMP/DISCECTOMY FUSION N/A 08/02/2022   Procedure: Anterior Cervical Decompression/Discectomy Fusion - Cervical Three-Cervical Four,  Cervical Four-Cervical Five,  remove Cervical Five-Cervical Six Plate;  Surgeon: Joshua Alm Hamilton, MD;  Location: [facility] OR;  Service: Neurosurgery;  Laterality: N/A;  3C   BACK SURGERY  10/2009   Cervical, arterior   CARPAL TUNNEL RELEASE Left  2003   CARPAL TUNNEL RELEASE Bilateral    CATARACT EXTRACTION Bilateral 01/2012   COLONOSCOPY  2024   CORONARY BALLOON ANGIOPLASTY N/A 08/28/2021   Procedure: CORONARY BALLOON ANGIOPLASTY;  Surgeon: Elmira Newman PARAS, MD;  Location: [facility];  Service: Cardiovascular;  Laterality: N/A;   EUS N/A 07/15/2014   Procedure: FULL UPPER ENDOSCOPIC ULTRASOUND (EUS) RADIAL;  Surgeon: Belvie Just, MD;  Location: [facility];  Service: Endoscopy;  Laterality: N/A;   LEFT HEART CATH AND CORONARY ANGIOGRAPHY N/A 05/18/2019   Procedure: LEFT HEART CATH AND CORONARY ANGIOGRAPHY;  Surgeon: Ladona Heinz, MD;  Location: [facility];  Service: Cardiovascular;  Laterality: N/A;   LEFT HEART CATH AND CORONARY ANGIOGRAPHY N/A 08/28/2021   Procedure: LEFT HEART CATH AND CORONARY ANGIOGRAPHY;  Surgeon: Elmira Newman PARAS, MD;  Location: [facility];  Service: Cardiovascular;  Laterality: N/A;   NASAL SINUS SURGERY  1981   RETINAL Bilateral 06/2013   Retinal peel   SHOULDER SURGERY Right 2003   TONSILLECTOMY  1954   Patient Active Problem List   Diagnosis Date Noted   (HFpEF) heart failure with preserved ejection fraction (HCC) 06/24/2023   Mild aortic stenosis 06/24/2023   Hypothyroidism 06/24/2023   Heart failure (HCC) 06/20/2023  Acute right-sided congestive heart failure (HCC) 06/20/2023   Shortness of breath 06/20/2023   Nonrheumatic aortic valve stenosis 02/07/2023   Hypersomnia, persistent 12/26/2022   Nasal congestion 12/26/2022   Post traumatic stress disorder (PTSD) 12/26/2022   S/P cervical spinal fusion 08/02/2022   Parkinsonism (HCC) 12/06/2021   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/06/2021   Hyperkalemia 12/06/2020   CAD (coronary artery disease) 12/03/2019   Esophageal dysphagia 11/21/2019   Gastroesophageal reflux disease 11/21/2019   Family history of colon cancer 11/21/2019   Abnormal stress test 05/11/2019   Mixed hyperlipidemia 05/11/2019    Angina pectoris (HCC) 08/07/2018   Essential hypertension 08/07/2018   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, with long-term current use of insulin  (HCC) 08/07/2018   Morbid obesity (HCC) 08/28/2017   Neuropathy 08/28/2017   Numbness and tingling of both legs below knees 11/10/2013   OSA on CPAP 05/14/2013   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   Obesity (BMI 30.0-34.9) 05/14/2013    ONSET DATE: worse over past several weeks  REFERRING DIAG: R26.89 (ICD-10-CM) - Other abnormalities of gait and mobility  THERAPY DIAG:  Other abnormalities of gait and mobility  Unsteadiness on feet  Muscle weakness (generalized)  Difficulty in walking, not elsewhere classified  Rationale for Evaluation and Treatment: Rehabilitation  SUBJECTIVE:                                                                                                                                                                                             SUBJECTIVE STATEMENT: Doing ok, uneventful weekend. Get dizzy/lightheaded with arising too quickly. Lasix  Rx has been reduced by half. Neck is doing much better today  Pt accompanied by: significant other  PERTINENT HISTORY: hypertension, hyperlipidemia, CAD, type 2 DM, mild AS, OSA on CPAP, hypothyroidism, Parkinson's disease, GERD.  PAIN:  Are you having pain? Yes: NPRS scale: 2-3/10 Pain location: lower leftneck Pain description: sore, heavy headed Aggravating factors: upright Relieving factors: reclined, rest  PRECAUTIONS: Fall  RED FLAGS: None   WEIGHT BEARING RESTRICTIONS: No  FALLS: Has patient fallen in last 6 months? Yes. Number of falls 7  LIVING ENVIRONMENT: Lives with: lives with their family and lives with their spouse Lives in: House/apartment Stairs: Yes, exterior: 4 steps; Ground-floor set-up Has following equipment at home: None  PLOF: Independent with basic ADLs  PATIENT GOALS: improve  balance  OBJECTIVE:   TODAY'S TREATMENT: 07/14/23 Activity Comments  109/73 mmHg, 72 bpm   treadmill -2 min at 2.5 mph 30 sec at 3.0 mph; 60 sec 2.5 mph (2 rounds)--2nd round felt lightheaded (98%, 115 bpm)--4  min total -1 min at 1.8 mph, dodging/stepping obstacles x 1.5 min. 30 sec at 8% grade x 2 rounds-- 8 min 18 sec total Rates 7/10 RPE (115 bpm)  Sit to stand to 6 hurdle step forward/backward               TODAY'S TREATMENT: 07/09/23 Activity Comments  NU-step level 5 x 10 min MHP to neck, LE only cycling  Step-up w/ knee drive [id number] BUE support, difficulty w/ right knee extension from OA  Lateral step-up 1x10 8, single UE support  Alt stair taps x 2 min 8, 5#  Sidestepping x 2 min 5#, UE support  Hip abduction 2x10 5#  Multisensory balance On foam: EO/EC, postural perturbations, lateral/ant-post sway x 60 sec             PATIENT EDUCATION: Education details: educated pt on conservative measures to address orthostatics (see pt instructions), edu on test results and update to goals; edu and demo on self-massage with tennis ball against wall to address muscular pain and tightness  Person educated: Patient Education method: Explanation, Demonstration, Tactile cues, Verbal cues, and Handouts Education comprehension: verbalized understanding and returned demonstration      Note: Objective measures were completed at Evaluation unless otherwise noted.  DIAGNOSTIC FINDINGS:   Vitals: 107/67 mmHg, 91 bpm  COGNITION: Overall cognitive status: Within functional limits for tasks assessed   SENSATION: Not tested, reports hx of neuropathy in fingers  COORDINATION: Difficulty to rapid alternating movement Heel to shin WNL  EDEMA:  None at present, currently daily weights for CHF  MUSCLE TONE: no hypertonicity noted  MUSCLE LENGTH: WNL  DTRs:  NT  POSTURE: rounded shoulders and forward head  LOWER EXTREMITY ROM:     Active  Right Eval Left Eval   Hip flexion    Hip extension    Hip abduction    Hip adduction    Hip internal rotation    Hip external rotation    Knee flexion    Knee extension    Ankle dorsiflexion    Ankle plantarflexion    Ankle inversion    Ankle eversion     (Blank rows = not tested)  LOWER EXTREMITY MMT:    MMT Right Eval Left Eval  Hip flexion    Hip extension    Hip abduction    Hip adduction    Hip internal rotation    Hip external rotation    Knee flexion    Knee extension    Ankle dorsiflexion    Ankle plantarflexion    Ankle inversion    Ankle eversion    (Blank rows = not tested)  BED MOBILITY:  Independent  TRANSFERS: Independent with sit-stand and chair-chair Floor to stand: reports need for physical assistance after falling or using furniture if intentionally getting on to ground and up again  STAIRS: TBD GAIT: Findings: Comments: ambulates independently level surfaces, no AD in past  FUNCTIONAL TESTS:  Mini-BESTest: TBD Berg Balance Test: 45/56 : TBD  PATIENT SURVEYS:  Freezing of gait questionnaire TBD  TREATMENT DATE:     PATIENT EDUCATION: Education details: assessment details Person educated: Patient and Spouse Education method: Explanation Education comprehension: verbalized understanding  HOME EXERCISE PROGRAM: TBD  GOALS: Goals reviewed with patient? Yes  SHORT TERM GOALS: Target date: 07/21/2023    Patient will be independent in HEP to improve functional outcomes Baseline: Goal status: IN PROGRESS  2.  Demo improved BLE strength and balance per time < 15 sec 5xSTS test Baseline: 20 sec Goal status: IN PROGRESS  3.  Demo improved static balance and reduced risk for falls per score 50/56 Berg Balance Test Baseline: 45/56 Goal status: IN PROGRESS    LONG TERM GOALS: Target date: 08/11/2023    Demo improved  mobility and reduced risk for falls per score 24/28 Mini-BESTest Baseline: 15/28 Goal status: INITIAL  2.  Demo reduced risk for falls per time 4.8 sec Baseline: 8.05 sec Goal status: INITIAL  3.  Patient to ambulate 1100 feet without AD during without lightheadedness.  Baseline: 795 feet without AD. Required sit break at 5 min 30 sec 07/07/23 Goal status: IN PROGRESS 07/07/23  4.  Teach back relevant programs/activities for fitness/exercise for those w/ PD to improve carryover at D/C Baseline:  Goal status: INITIAL   ASSESSMENT:  CLINICAL IMPRESSION: Reports overall activity tolerance improvement. Gait training initiated in treadmill to improve safety/tolerance to faster speeds and progressing to reactive balance activities for stepping over obstacles requiring BUE support for stability. Step height activity to promote increased foot clearance over obstacles and single limb support to improve safety with maneuvering in small spaces requiring CGA-min A to correct LOB. Vitals monitored throughout and no adverse response other than lightheaded with sit to stand requiring a few moments before initiating movement.   OBJECTIVE IMPAIRMENTS: Abnormal gait, decreased activity tolerance, decreased balance, decreased coordination, decreased endurance, decreased knowledge of use of DME, difficulty walking, decreased strength, dizziness, and postural dysfunction.   ACTIVITY LIMITATIONS: carrying, lifting, bending, squatting, transfers, reach over head, and locomotion level  PARTICIPATION LIMITATIONS: meal prep, cleaning, laundry, interpersonal relationship, shopping, community activity, and yard work  PERSONAL FACTORS: Age, Time since onset of injury/illness/exacerbation, and 3+ comorbidities: PMH are also affecting patient's functional outcome.   REHAB POTENTIAL: Good  CLINICAL DECISION MAKING: Evolving/moderate complexity  EVALUATION COMPLEXITY: Moderate  PLAN:  PT FREQUENCY:  1-2x/week  PT DURATION: 6 weeks  PLANNED INTERVENTIONS: 97750- Physical Performance Testing, 97110-Therapeutic exercises, 97530- Therapeutic activity, [id number]- Neuromuscular re-education, 97535- Self Care, [id number]- Manual therapy, [id number]- Gait training, [id number]- Orthotic Initial, [phone]- Canalith repositioning, and [phone]- Aquatic Therapy  PLAN FOR NEXT SESSION: progress endurance and stamina while monitoring BP    2:50 PM, 07/14/23 M. Kelly Mala Gibbard, PT, DPT Physical Therapist- [place] Office Number: [phone]

## 2023-07-14 NOTE — Telephone Encounter (Signed)
 CPAP HTA shara: [id number] (exp. 06/26/23 to 09/24/23)   Patient is scheduled at [facility] For 08/06/23 at 9pm.  Mailed packet and sent mychart

## 2023-07-15 NOTE — Telephone Encounter (Signed)
 Spoke to patient have him results of theDG SWALLOW FUNC OP MEDICARE SPEECH PATH Gave Megan,NP recommendations to follow up with Gastroenterologist. Pt states he will follow up with [facility] Pt thanked me for calling

## 2023-07-16 ENCOUNTER — Ambulatory Visit: Admitting: Physical Therapy

## 2023-07-16 ENCOUNTER — Encounter: Payer: Self-pay | Admitting: Physical Therapy

## 2023-07-16 DIAGNOSIS — M6281 Muscle weakness (generalized): Secondary | ICD-10-CM

## 2023-07-16 DIAGNOSIS — R2689 Other abnormalities of gait and mobility: Secondary | ICD-10-CM

## 2023-07-16 DIAGNOSIS — R2681 Unsteadiness on feet: Secondary | ICD-10-CM

## 2023-07-16 NOTE — Therapy (Signed)
 OUTPATIENT PHYSICAL THERAPY NEURO TREATMENT   Patient Name: Anthony [name] MRN: [id number] DOB:[birth year], 78 y.o., male Today's Date: 07/16/2023   PCP: Dohmeier, Dedra REFERRING PROVIDER: Natalia Waddell LABOR, PA-C  END OF SESSION:  PT End of Session - 07/16/23 1455     Visit Number 6    Number of Visits 13    Date for PT Re-Evaluation 08/11/23    Authorization Type [employer]    Progress Note Due on Visit 10    PT Start Time 1451    PT Stop Time 1530    PT Time Calculation (min) 39 min    Equipment Utilized During Treatment Gait belt   wearing compression stockings   Activity Tolerance Patient tolerated treatment well;Other (comment)   lightheadedness   Behavior During Therapy WFL for tasks assessed/performed            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   Neuromuscular disorder (HCC) 2023   Parkinson's Disease   Neuropathy    left leg greater than right leg   Obesity (BMI 30.0-34.9) 05/14/2013   OSA on CPAP    cpap setting of 10/ 13   Pancreatitis dx march 2016   Pneumonia 12 years ago   Past Surgical History:  Procedure Laterality Date   ANTERIOR CERVICAL DECOMP/DISCECTOMY FUSION N/A 08/02/2022   Procedure: Anterior Cervical Decompression/Discectomy Fusion - Cervical Three-Cervical Four,  Cervical Four-Cervical Five,  remove Cervical Five-Cervical Six Plate;  Surgeon: Joshua Alm Hamilton, MD;  Location: [facility] OR;  Service: Neurosurgery;  Laterality: N/A;  3C   BACK SURGERY  10/2009   Cervical, arterior    CARPAL TUNNEL RELEASE Left 2003   CARPAL TUNNEL RELEASE Bilateral    CATARACT EXTRACTION Bilateral 01/2012   COLONOSCOPY  2024   CORONARY BALLOON ANGIOPLASTY N/A 08/28/2021   Procedure: CORONARY BALLOON ANGIOPLASTY;  Surgeon: Elmira Newman PARAS, MD;  Location: [facility];  Service: Cardiovascular;  Laterality: N/A;   EUS N/A 07/15/2014   Procedure: FULL UPPER ENDOSCOPIC ULTRASOUND (EUS) RADIAL;  Surgeon: Belvie Just, MD;  Location: [facility] END[facility];  Service: Endoscopy;  Laterality: N/A;   LEFT HEART CATH AND CORONARY ANGIOGRAPHY N/A 05/18/2019   Procedure: LEFT HEART CATH AND CORONARY ANGIOGRAPHY;  Surgeon: Ladona Heinz, MD;  Location: [facility];  Service: Cardiovascular;  Laterality: N/A;   LEFT HEART CATH AND CORONARY ANGIOGRAPHY N/A 08/28/2021   Procedure: LEFT HEART CATH AND CORONARY ANGIOGRAPHY;  Surgeon: Elmira Newman PARAS, MD;  Location: [facility];  Service: Cardiovascular;  Laterality: N/A;   NASAL SINUS SURGERY  1981   RETINAL Bilateral 06/2013   Retinal peel   SHOULDER SURGERY Right 2003   TONSILLECTOMY  1954   Patient Active Problem List   Diagnosis Date Noted   (HFpEF) heart failure with preserved ejection fraction (HCC) 06/24/2023   Mild aortic stenosis 06/24/2023   Hypothyroidism 06/24/2023  Heart failure (HCC) 06/20/2023   Acute right-sided congestive heart failure (HCC) 06/20/2023   Shortness of breath 06/20/2023   Nonrheumatic aortic valve stenosis 02/07/2023   Hypersomnia, persistent 12/26/2022   Nasal congestion 12/26/2022   Post traumatic stress disorder (PTSD) 12/26/2022   S/P cervical spinal fusion 08/02/2022   Parkinsonism (HCC) 12/06/2021   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/06/2021   Hyperkalemia 12/06/2020   CAD (coronary artery disease) 12/03/2019   Esophageal dysphagia 11/21/2019   Gastroesophageal reflux disease 11/21/2019   Family history of colon cancer 11/21/2019   Abnormal stress test 05/11/2019   Mixed  hyperlipidemia 05/11/2019   Angina pectoris (HCC) 08/07/2018   Essential hypertension 08/07/2018   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, with long-term current use of insulin  (HCC) 08/07/2018   Morbid obesity (HCC) 08/28/2017   Neuropathy 08/28/2017   Numbness and tingling of both legs below knees 11/10/2013   OSA on CPAP 05/14/2013   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   Obesity (BMI 30.0-34.9) 05/14/2013    ONSET DATE: worse over past several weeks  REFERRING DIAG: R26.89 (ICD-10-CM) - Other abnormalities of gait and mobility  THERAPY DIAG:  Other abnormalities of gait and mobility  Unsteadiness on feet  Muscle weakness (generalized)  Rationale for Evaluation and Treatment: Rehabilitation  SUBJECTIVE:                                                                                                                                                                                             SUBJECTIVE STATEMENT: Cardiologist is monitoring fluid intake and has cut the Lasix  in half.  Wearing compression stockings today.  Pt accompanied by: significant other  PERTINENT HISTORY: hypertension, hyperlipidemia, CAD, type 2 DM, mild AS, OSA on CPAP, hypothyroidism, Parkinson's disease, GERD.  PAIN:  Are you having pain? Yes: NPRS scale: 2-3/10 Pain location: lower leftneck Pain description: sore, heavy headed Aggravating factors: upright Relieving factors: reclined, rest  PRECAUTIONS: Fall  RED FLAGS: None   WEIGHT BEARING RESTRICTIONS: No  FALLS: Has patient fallen in last 6 months? Yes. Number of falls 7  LIVING ENVIRONMENT: Lives with: lives with their family and lives with their spouse Lives in: House/apartment Stairs: Yes, exterior: 4 steps; Ground-floor set-up Has following equipment at home: None  PLOF: Independent with basic ADLs  PATIENT GOALS: improve balance  OBJECTIVE:    TODAY'S TREATMENT: 07/16/2023 Activity  Comments  Began session  in lobby with seated ankle pumps, LAQ and marching in place Sit to stand-pt has dizziness/lightheadedness about 3-5 sec after standing, passes quickly   Gait with no device into clinic with  close supervision/ CGA   Vitals 95/64, HR 78 bpm sitting  Sit to stand, lightheadedness after 3-5 sec standing, passes quickly   NuStep, Level 3>5, 4 extremities x 8 minutes SPM > 75-80  Vitals: 97/63 HR 75 bpm   Seated LAQ 10 reps Seated marching 10 reps Seated hip flexion x 10 Seated hamstring curls x 10 Green theraband Cues to not hold breath  Gait 150 ft with rollator, supervision Good form with rollator      HOME EXERCISE PROGRAM: Access Code: [email or web address] URL: [email or web address] Date: 07/16/2023 Prepared by: [facility]patient  Rehab - [facility]  Exercises - Sitting Knee Extension with Resistance  - 1 x daily - 5 x weekly - 3 sets - 10 reps - Seated March with Resistance  - 1 x daily - 5 x weekly - 3 sets - 10 reps - Seated Hip Abduction with Resistance  - 1 x daily - 5 x weekly - 3 sets - 10 reps - Seated Hamstring Curls with Resistance  - 1 x daily - 5 x weekly - 3 sets - 10 reps          PATIENT EDUCATION: Education details:HEP initiated-see above; discussed use of rollator on days when BP is lower and balance is not as good Person educated: Patient Education method: Explanation, Demonstration, Tactile cues, Verbal cues, and Handouts Education comprehension: verbalized understanding and returned demonstration      Note: Objective measures were completed at Evaluation unless otherwise noted.  DIAGNOSTIC FINDINGS:   Vitals: 107/67 mmHg, 91 bpm  COGNITION: Overall cognitive status: Within functional limits for tasks assessed   SENSATION: Not tested, reports hx of neuropathy in fingers  COORDINATION: Difficulty to rapid alternating movement Heel to shin WNL  EDEMA:  None at present, currently daily weights for  CHF  MUSCLE TONE: no hypertonicity noted  MUSCLE LENGTH: WNL  DTRs:  NT  POSTURE: rounded shoulders and forward head  LOWER EXTREMITY ROM:     Active  Right Eval Left Eval  Hip flexion    Hip extension    Hip abduction    Hip adduction    Hip internal rotation    Hip external rotation    Knee flexion    Knee extension    Ankle dorsiflexion    Ankle plantarflexion    Ankle inversion    Ankle eversion     (Blank rows = not tested)  LOWER EXTREMITY MMT:    MMT Right Eval Left Eval  Hip flexion    Hip extension    Hip abduction    Hip adduction    Hip internal rotation    Hip external rotation    Knee flexion    Knee extension    Ankle dorsiflexion    Ankle plantarflexion    Ankle inversion    Ankle eversion    (Blank rows = not tested)  BED MOBILITY:  Independent  TRANSFERS: Independent with sit-stand and chair-chair Floor to stand: reports need for physical assistance after falling or using furniture if intentionally getting on to ground and up again  STAIRS: TBD GAIT: Findings: Comments: ambulates independently level surfaces, no AD in past  FUNCTIONAL TESTS:  Mini-BESTest: TBD Berg Balance Test: 45/56 : TBD  PATIENT SURVEYS:  Freezing of gait questionnaire TBD  TREATMENT DATE:     PATIENT EDUCATION: Education details: assessment details Person educated: Patient and Spouse Education method: Explanation Education comprehension: verbalized understanding  HOME EXERCISE PROGRAM: TBD  GOALS: Goals reviewed with patient? Yes  SHORT TERM GOALS: Target date: 07/21/2023    Patient will be independent in HEP to improve functional outcomes Baseline: Goal status: IN PROGRESS  2.  Demo improved BLE strength and balance per time < 15 sec 5xSTS test Baseline: 20 sec Goal status: IN PROGRESS  3.  Demo improved  static balance and reduced risk for falls per score 50/56 Berg Balance Test Baseline: 45/56 Goal status: IN PROGRESS    LONG TERM GOALS: Target date: 08/11/2023    Demo improved mobility and reduced risk for falls per score 24/28 Mini-BESTest Baseline: 15/28 Goal status: INITIAL  2.  Demo reduced risk for falls per time 4.8 sec Baseline: 8.05 sec Goal status: INITIAL  3.  Patient to ambulate 1100 feet without AD during without lightheadedness.  Baseline: 795 feet without AD. Required sit break at 5 min 30 sec 07/07/23 Goal status: IN PROGRESS 07/07/23  4.  Teach back relevant programs/activities for fitness/exercise for those w/ PD to improve carryover at D/C Baseline:  Goal status: INITIAL   ASSESSMENT:  CLINICAL IMPRESSION: Pt presents today with no new complaints, no new falls. Assessed BP measures today and pt's measures are low, even after seated gentle aerobic activity.  He tends to be most symptomatic with lightheadedness 3-5 seconds after standing and this passes quickly.  Closely monitored patient throughout session and worked primarily in sitting for exercises today.  Educated in use of rollator on days when his BP is lower and his balance feels off; pt states he may like to try an UpWalker in addition to trial rollator. Pt will continue to benefit from skilled PT towards goals for improved functional mobility and decreased fall risk.    OBJECTIVE IMPAIRMENTS: Abnormal gait, decreased activity tolerance, decreased balance, decreased coordination, decreased endurance, decreased knowledge of use of DME, difficulty walking, decreased strength, dizziness, and postural dysfunction.   ACTIVITY LIMITATIONS: carrying, lifting, bending, squatting, transfers, reach over head, and locomotion level  PARTICIPATION LIMITATIONS: meal prep, cleaning, laundry, interpersonal relationship, shopping, community activity, and yard work  PERSONAL FACTORS: Age, Time since onset of  injury/illness/exacerbation, and 3+ comorbidities: PMH are also affecting patient's functional outcome.   REHAB POTENTIAL: Good  CLINICAL DECISION MAKING: Evolving/moderate complexity  EVALUATION COMPLEXITY: Moderate  PLAN:  PT FREQUENCY: 1-2x/week  PT DURATION: 6 weeks  PLANNED INTERVENTIONS: 97750- Physical Performance Testing, 97110-Therapeutic exercises, 97530- Therapeutic activity, [id number]- Neuromuscular re-education, 97535- Self Care, [id number]- Manual therapy, [id number]- Gait training, [id number]- Orthotic Initial, [phone]- Canalith repositioning, and [id number]- Aquatic Therapy  PLAN FOR NEXT SESSION: Discuss options for walker (rollator versus ?Upwalker-pt requests); could potentially ask to borrow UpWalker from [employer]; progress endurance and stamina while monitoring BP    Greig Anon, PT 07/16/23 5:12 PM Phone: [phone] Fax: [phone]   [facility] [address], [address] [place], [place] [address] Phone # [phone] Fax # [phone]

## 2023-07-17 DIAGNOSIS — I5032 Chronic diastolic (congestive) heart failure: Secondary | ICD-10-CM | POA: Diagnosis not present

## 2023-07-17 DIAGNOSIS — N179 Acute kidney failure, unspecified: Secondary | ICD-10-CM | POA: Diagnosis not present

## 2023-07-17 DIAGNOSIS — E1165 Type 2 diabetes mellitus with hyperglycemia: Secondary | ICD-10-CM | POA: Diagnosis not present

## 2023-07-18 LAB — BASIC METABOLIC PANEL WITH GFR
BUN/Creatinine Ratio: 15 (ref 10–24)
BUN: 17 mg/dL (ref 8–27)
CO2: 24 mmol/L (ref 20–29)
Calcium: 9.7 mg/dL (ref 8.6–10.2)
Chloride: 95 mmol/L — ABNORMAL LOW (ref 96–106)
Creatinine, Ser: 1.1 mg/dL (ref 0.76–1.27)
Glucose: 121 mg/dL — ABNORMAL HIGH (ref 70–99)
Potassium: 4.1 mmol/L (ref 3.5–5.2)
Sodium: 138 mmol/L (ref 134–144)
eGFR: 69 mL/min/{1.73_m2} (ref >59)

## 2023-07-21 ENCOUNTER — Ambulatory Visit: Admitting: Physical Therapy

## 2023-07-21 ENCOUNTER — Encounter: Payer: Self-pay | Admitting: Physical Therapy

## 2023-07-21 DIAGNOSIS — M6281 Muscle weakness (generalized): Secondary | ICD-10-CM

## 2023-07-21 DIAGNOSIS — R2689 Other abnormalities of gait and mobility: Secondary | ICD-10-CM | POA: Diagnosis not present

## 2023-07-21 DIAGNOSIS — R2681 Unsteadiness on feet: Secondary | ICD-10-CM

## 2023-07-21 NOTE — Therapy (Signed)
 OUTPATIENT PHYSICAL THERAPY NEURO TREATMENT   Patient Name: Anthony [name] MRN: [id number] DOB:[birth year], 78 y.o., male Today's Date: 07/21/2023   PCP: Dohmeier, Dedra REFERRING PROVIDER: Natalia Waddell LABOR, PA-C  END OF SESSION:  PT End of Session - 07/21/23 1449     Visit Number 7    Number of Visits 13    Date for PT Re-Evaluation 08/11/23    Authorization Type [employer]    Progress Note Due on Visit 10    PT Start Time 1449    PT Stop Time 1513    PT Time Calculation (min) 24 min    Equipment Utilized During Treatment Gait belt   wearing compression stockings   Activity Tolerance Patient tolerated treatment well;Other (comment)   lightheadedness; low blood sugar (per his monitor)-67   Behavior During Therapy [facility] for tasks assessed/performed             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   Neuromuscular disorder (HCC) 2023   Parkinson's Disease   Neuropathy    left leg greater than right leg   Obesity (BMI 30.0-34.9) 05/14/2013   OSA on CPAP    cpap setting of 10/ 13   Pancreatitis dx march 2016   Pneumonia 12 years ago   Past Surgical History:  Procedure Laterality Date   ANTERIOR CERVICAL DECOMP/DISCECTOMY FUSION N/A 08/02/2022   Procedure: Anterior Cervical Decompression/Discectomy Fusion - Cervical Three-Cervical Four,  Cervical Four-Cervical Five,  remove Cervical Five-Cervical Six Plate;  Surgeon: Joshua Alm Hamilton, MD;  Location: [facility] OR;  Service: Neurosurgery;  Laterality: N/A;  3C    BACK SURGERY  10/2009   Cervical, arterior   CARPAL TUNNEL RELEASE Left 2003   CARPAL TUNNEL RELEASE Bilateral    CATARACT EXTRACTION Bilateral 01/2012   COLONOSCOPY  2024   CORONARY BALLOON ANGIOPLASTY N/A 08/28/2021   Procedure: CORONARY BALLOON ANGIOPLASTY;  Surgeon: Elmira Newman PARAS, MD;  Location: [facility];  Service: Cardiovascular;  Laterality: N/A;   EUS N/A 07/15/2014   Procedure: FULL UPPER ENDOSCOPIC ULTRASOUND (EUS) RADIAL;  Surgeon: Belvie Just, MD;  Location: [facility] ENDOS[facility];  Service: Endoscopy;  Laterality: N/A;   LEFT HEART CATH AND CORONARY ANGIOGRAPHY N/A 05/18/2019   Procedure: LEFT HEART CATH AND CORONARY ANGIOGRAPHY;  Surgeon: Ladona Heinz, MD;  Location: [facility];  Service: Cardiovascular;  Laterality: N/A;   LEFT HEART CATH AND CORONARY ANGIOGRAPHY N/A 08/28/2021   Procedure: LEFT HEART CATH AND CORONARY ANGIOGRAPHY;  Surgeon: Elmira Newman PARAS, MD;  Location: [facility];  Service: Cardiovascular;  Laterality: N/A;   NASAL SINUS SURGERY  1981   RETINAL Bilateral 06/2013   Retinal peel   SHOULDER SURGERY Right 2003   TONSILLECTOMY  1954   Patient Active Problem List   Diagnosis Date Noted   (HFpEF) heart failure with preserved ejection fraction (HCC) 06/24/2023   Mild aortic stenosis  06/24/2023   Hypothyroidism 06/24/2023   Heart failure (HCC) 06/20/2023   Acute right-sided congestive heart failure (HCC) 06/20/2023   Shortness of breath 06/20/2023   Nonrheumatic aortic valve stenosis 02/07/2023   Hypersomnia, persistent 12/26/2022   Nasal congestion 12/26/2022   Post traumatic stress disorder (PTSD) 12/26/2022   S/P cervical spinal fusion 08/02/2022   Parkinsonism (HCC) 12/06/2021   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/06/2021   Hyperkalemia 12/06/2020   CAD (coronary artery disease) 12/03/2019   Esophageal dysphagia 11/21/2019   Gastroesophageal reflux disease 11/21/2019   Family history of colon cancer 11/21/2019    Abnormal stress test 05/11/2019   Mixed hyperlipidemia 05/11/2019   Angina pectoris (HCC) 08/07/2018   Essential hypertension 08/07/2018   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, with long-term current use of insulin  (HCC) 08/07/2018   Morbid obesity (HCC) 08/28/2017   Neuropathy 08/28/2017   Numbness and tingling of both legs below knees 11/10/2013   OSA on CPAP 05/14/2013   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   Obesity (BMI 30.0-34.9) 05/14/2013    ONSET DATE: worse over past several weeks  REFERRING DIAG: R26.89 (ICD-10-CM) - Other abnormalities of gait and mobility  THERAPY DIAG:  Other abnormalities of gait and mobility  Unsteadiness on feet  Muscle weakness (generalized)  Rationale for Evaluation and Treatment: Rehabilitation  SUBJECTIVE:                                                                                                                                                                                             SUBJECTIVE STATEMENT: They've put me on one of the heart monitors; checked my BP and it was low 89/54 in sitting.  Pushed my monitor to put in that BP reading.  Go back in July to heart doctor.  Pt accompanied by: significant other  PERTINENT HISTORY: hypertension, hyperlipidemia, CAD, type 2 DM, mild AS, OSA on CPAP, hypothyroidism, Parkinson's disease, GERD.  PAIN:  Are you having pain? No  PRECAUTIONS: Fall  RED FLAGS: None   WEIGHT BEARING RESTRICTIONS: No  FALLS: Has patient fallen in last 6 months? Yes. Number of falls 7  LIVING ENVIRONMENT: Lives with: lives with their family and lives with their spouse Lives in: House/apartment Stairs: Yes, exterior: 4 steps; Ground-floor set-up Has following equipment at home: None  PLOF: Independent with basic ADLs  PATIENT GOALS: improve balance  OBJECTIVE:    TODAY'S TREATMENT: 07/21/2023 Activity Comments  Vitals 94/65 seated  88/60 standing    Review of HEP: Seated march, LAQ, hip abduction, HS curls Green theraband x 10 Cues for placement with HS  curls  FTSTS NT  Berg NT               *Pt's blood sugar monitor beeps and blood sugar is 67.  He reports he is planning to eat soon.  Pt is asymptomatic and wife is present in lobby; they plan to go to get something to eat as pt leaves PT session.    HOME EXERCISE PROGRAM: Access Code: [email or web address] URL: [email or web address] Date: 07/16/2023 Prepared by: [facility] - [facility]  Exercises - Sitting Knee Extension with Resistance  - 1 x daily - 5 x weekly - 3 sets - 10 reps - Seated March with Resistance  - 1 x daily - 5 x weekly - 3 sets - 10 reps - Seated Hip Abduction with Resistance  - 1 x daily - 5 x weekly - 3 sets - 10 reps - Seated Hamstring Curls with Resistance  - 1 x daily - 5 x weekly - 3 sets - 10 reps          PATIENT EDUCATION: Education details:Discussed BP measures and how the low BP measures are limited ability to progress with therapy; pt is agreeable to PT reaching out to cardiologist office to let them know BP measures in PT sessions; discussed pt's blood sugar measures today (67 is low and discussed that exercise can make the numbers go even lower).  Discussed having a snack on hand or eating a small snack prior to therapy to help keep blood sugars such that he can participate more in exercise. Person educated: Patient Education method: Explanation, Demonstration, Tactile cues, Verbal cues, and Handouts Education comprehension: verbalized understanding and returned demonstration      Note: Objective measures were completed at Evaluation unless otherwise noted.  DIAGNOSTIC FINDINGS:   Vitals: 107/67 mmHg, 91 bpm  COGNITION: Overall cognitive status: Within functional limits for tasks assessed   SENSATION: Not tested, reports hx of neuropathy in fingers  COORDINATION: Difficulty to rapid alternating  movement Heel to shin WNL  EDEMA:  None at present, currently daily weights for CHF  MUSCLE TONE: no hypertonicity noted  MUSCLE LENGTH: WNL  DTRs:  NT  POSTURE: rounded shoulders and forward head  LOWER EXTREMITY ROM:     Active  Right Eval Left Eval  Hip flexion    Hip extension    Hip abduction    Hip adduction    Hip internal rotation    Hip external rotation    Knee flexion    Knee extension    Ankle dorsiflexion    Ankle plantarflexion    Ankle inversion    Ankle eversion     (Blank rows = not tested)  LOWER EXTREMITY MMT:    MMT Right Eval Left Eval  Hip flexion    Hip extension    Hip abduction    Hip adduction    Hip internal rotation    Hip external rotation    Knee flexion    Knee extension    Ankle dorsiflexion    Ankle plantarflexion    Ankle inversion    Ankle eversion    (Blank rows = not tested)  BED MOBILITY:  Independent  TRANSFERS: Independent with sit-stand and chair-chair Floor to stand: reports need for physical assistance after falling or using furniture if intentionally getting on to ground and up again  STAIRS: TBD GAIT: Findings: Comments: ambulates independently level surfaces, no AD in past  FUNCTIONAL TESTS:  Mini-BESTest: TBD Berg Balance Test: 45/56  : TBD  PATIENT SURVEYS:  Freezing of gait questionnaire TBD                                                                                                                              TREATMENT DATE:     PATIENT EDUCATION: Education details: assessment details Person educated: Patient and Spouse Education method: Explanation Education comprehension: verbalized understanding  HOME EXERCISE PROGRAM: TBD  GOALS: Goals reviewed with patient? Yes  SHORT TERM GOALS: Target date: 07/21/2023    Patient will be independent in HEP to improve functional outcomes Baseline: Goal status: IN PROGRESS  2.  Demo improved BLE strength and balance per time <  15 sec 5xSTS test Baseline: 20 sec Goal status: IN PROGRESS  3.  Demo improved static balance and reduced risk for falls per score 50/56 Berg Balance Test Baseline: 45/56 Goal status: IN PROGRESS    LONG TERM GOALS: Target date: 08/11/2023    Demo improved mobility and reduced risk for falls per score 24/28 Mini-BESTest Baseline: 15/28 Goal status: INITIAL  2.  Demo reduced risk for falls per time 4.8 sec Baseline: 8.05 sec Goal status: INITIAL  3.  Patient to ambulate 1100 feet without AD during without lightheadedness.  Baseline: 795 feet without AD. Required sit break at 5 min 30 sec 07/07/23 Goal status: IN PROGRESS 07/07/23  4.  Teach back relevant programs/activities for fitness/exercise for those w/ PD to improve carryover at D/C Baseline:  Goal status: INITIAL   ASSESSMENT:  CLINICAL IMPRESSION: Pt presents today with reports of taking it easy over the weekend and BP measures remaining low.  Unable to assess STGs today due to low BP measures and also pt's blood sugar monitor alerts him that his glucose level is 67.  Opted (with pt in agreement) to not proceed further with activity in PT session to not drop blood sugar measures further, as his last meal was at 7 am.  Educated pt that his blood pressure (and blood sugar measures) are affecting his ability to fully participate in therapy; he will continue to benefit from skilled PT towards goals for improved functional mobility and decreased fall risk.   OBJECTIVE IMPAIRMENTS: Abnormal gait, decreased activity tolerance, decreased balance, decreased coordination, decreased endurance, decreased knowledge of use of DME, difficulty walking, decreased strength, dizziness, and postural dysfunction.   ACTIVITY LIMITATIONS: carrying, lifting, bending, squatting, transfers, reach over head, and locomotion level  PARTICIPATION LIMITATIONS: meal prep, cleaning, laundry, interpersonal relationship, shopping, community  activity, and yard work  PERSONAL FACTORS: Age, Time since onset of injury/illness/exacerbation, and 3+ comorbidities: PMH are also affecting patient's functional outcome.   REHAB POTENTIAL: Good  CLINICAL DECISION MAKING: Evolving/moderate complexity  EVALUATION COMPLEXITY: Moderate  PLAN:  PT FREQUENCY: 1-2x/week  PT DURATION: 6 weeks  PLANNED INTERVENTIONS: 97750- Physical Performance Testing, 97110-Therapeutic exercises, 97530- Therapeutic activity, [id number]- Neuromuscular re-education, 97535- Self Care, [id number]- Manual therapy, [id number]- Gait training, [id number]- Orthotic Initial, [phone]- Canalith  repositioning, and 02886- Aquatic Therapy  PLAN FOR NEXT SESSION: Discuss options for walker (rollator versus ?Upwalker-pt requests); could potentially ask to borrow UpWalker from [employer]; progress endurance and stamina while monitoring BP    Greig Anon, PT 07/21/23 3:22 PM Phone: [phone] Fax: [phone]   [facility] at [facility] [address] [place], [place] [address] Phone # [phone] Fax # [phone]

## 2023-07-22 MED ORDER — LOSARTAN POTASSIUM 25 MG PO TABS: 12.5000 mg | ORAL_TABLET | Freq: Every day | ORAL | 3 refills | Status: DC

## 2023-07-22 NOTE — Therapy (Signed)
 OUTPATIENT PHYSICAL THERAPY NEURO TREATMENT   Patient Name: Anthony [name] MRN: [id number] DOB:03-19-1945, 78 y.o., male Today's Date: 07/23/2023   PCP: Dohmeier, Dedra REFERRING PROVIDER: Natalia Waddell LABOR, PA-C  END OF SESSION:  PT End of Session - 07/23/23 1521     Visit Number 8    Number of Visits 13    Date for PT Re-Evaluation 08/11/23    Authorization Type [employer]    Progress Note Due on Visit 10    PT Start Time 1444    PT Stop Time 1527    PT Time Calculation (min) 43 min    Equipment Utilized During Treatment Gait belt   wearing compression stockings   Activity Tolerance Patient tolerated treatment well    Behavior During Therapy WFL for tasks assessed/performed              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   Neuromuscular disorder (HCC) 2023   Parkinson's Disease   Neuropathy    left leg greater than right leg   Obesity (BMI 30.0-34.9) 05/14/2013   OSA on CPAP    cpap setting of 10/ 13   Pancreatitis dx march 2016   Pneumonia 12 years ago   Past Surgical History:  Procedure Laterality Date   ANTERIOR CERVICAL DECOMP/DISCECTOMY FUSION N/A 08/02/2022   Procedure: Anterior Cervical Decompression/Discectomy Fusion - Cervical Three-Cervical Four,  Cervical Four-Cervical Five,  remove Cervical Five-Cervical Six Plate;  Surgeon: Joshua Alm Hamilton, MD;  Location: [facility];  Service: Neurosurgery;  Laterality: N/A;  3C   BACK SURGERY  10/2009   Cervical, arterior   CARPAL TUNNEL RELEASE Left  2003   CARPAL TUNNEL RELEASE Bilateral    CATARACT EXTRACTION Bilateral 01/2012   COLONOSCOPY  2024   CORONARY BALLOON ANGIOPLASTY N/A 08/28/2021   Procedure: CORONARY BALLOON ANGIOPLASTY;  Surgeon: Elmira Newman PARAS, MD;  Location: [facility];  Service: Cardiovascular;  Laterality: N/A;   EUS N/A 07/15/2014   Procedure: FULL UPPER ENDOSCOPIC ULTRASOUND (EUS) RADIAL;  Surgeon: Belvie Just, MD;  Location: [facility];  Service: Endoscopy;  Laterality: N/A;   LEFT HEART CATH AND CORONARY ANGIOGRAPHY N/A 05/18/2019   Procedure: LEFT HEART CATH AND CORONARY ANGIOGRAPHY;  Surgeon: Ladona Heinz, MD;  Location: [facility];  Service: Cardiovascular;  Laterality: N/A;   LEFT HEART CATH AND CORONARY ANGIOGRAPHY N/A 08/28/2021   Procedure: LEFT HEART CATH AND CORONARY ANGIOGRAPHY;  Surgeon: Elmira Newman PARAS, MD;  Location: [facility];  Service: Cardiovascular;  Laterality: N/A;   NASAL SINUS SURGERY  1981   RETINAL Bilateral 06/2013   Retinal peel   SHOULDER SURGERY Right 2003   TONSILLECTOMY  1954   Patient Active Problem List   Diagnosis Date Noted   (HFpEF) heart failure with preserved ejection fraction (HCC) 06/24/2023   Mild aortic stenosis 06/24/2023   Hypothyroidism 06/24/2023   Heart  failure (HCC) 06/20/2023   Acute right-sided congestive heart failure (HCC) 06/20/2023   Shortness of breath 06/20/2023   Nonrheumatic aortic valve stenosis 02/07/2023   Hypersomnia, persistent 12/26/2022   Nasal congestion 12/26/2022   Post traumatic stress disorder (PTSD) 12/26/2022   S/P cervical spinal fusion 08/02/2022   Parkinsonism (HCC) 12/06/2021   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/06/2021   Hyperkalemia 12/06/2020   CAD (coronary artery disease) 12/03/2019   Esophageal dysphagia 11/21/2019   Gastroesophageal reflux disease 11/21/2019   Family history of colon cancer 11/21/2019   Abnormal stress test 05/11/2019   Mixed hyperlipidemia 05/11/2019    Angina pectoris (HCC) 08/07/2018   Essential hypertension 08/07/2018   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, with long-term current use of insulin  (HCC) 08/07/2018   Morbid obesity (HCC) 08/28/2017   Neuropathy 08/28/2017   Numbness and tingling of both legs below knees 11/10/2013   OSA on CPAP 05/14/2013   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   Obesity (BMI 30.0-34.9) 05/14/2013    ONSET DATE: worse over past several weeks  REFERRING DIAG: R26.89 (ICD-10-CM) - Other abnormalities of gait and mobility  THERAPY DIAG:  Other abnormalities of gait and mobility  Unsteadiness on feet  Muscle weakness (generalized)  Difficulty in walking, not elsewhere classified  Rationale for Evaluation and Treatment: Rehabilitation  SUBJECTIVE:                                                                                                                                                                                             SUBJECTIVE STATEMENT: They have cut down the Losartan  but that is a small pill to have to cut in half. Reports continued dizziness. BG is 120.   Pt accompanied by: significant other  PERTINENT HISTORY: hypertension, hyperlipidemia, CAD, type 2 DM, mild AS, OSA on CPAP, hypothyroidism, Parkinson's disease, GERD.  PAIN:  Are you having pain? No  PRECAUTIONS: Fall  RED FLAGS: None   WEIGHT BEARING RESTRICTIONS: No  FALLS: Has patient fallen in last 6 months? Yes. Number of falls 7  LIVING ENVIRONMENT: Lives with: lives with their family and lives with their spouse Lives in: House/apartment Stairs: Yes, exterior: 4 steps; Ground-floor set-up Has following equipment at home: None  PLOF: Independent with basic ADLs  PATIENT GOALS: improve balance  OBJECTIVE:     TODAY'S TREATMENT: 07/23/23 Activity Comments  Edu on holding standing position upon transferring from sit to stand. Also reviewed need to perform sitting  marching and ankle pumps before transfers    Vitals at start of session 112/49mmHg, 74bpm, 96% spO2  Gait training with  5TT and edu on safe use of brakes and transfers with device   staggered ant/pos wt shift Good stability  lateral wide BOS wt shift Cueing demo required for  coordination  Standing march 3x20 To address brief c/o lightheadedness   Standing BP 114/63 mmHg, 84bpm  mini squat 5x Discontinued d/t lightheadedness   Nustep L5 x 6 min UEs/LEs Tolerated well           PATIENT EDUCATION: Education details: edu on obtaining 4WW on days when lightheadedness or imbalance is more of an issue  Person educated: Patient Education method: Explanation, Demonstration, Tactile cues, Verbal cues, and Handouts Education comprehension: verbalized understanding and returned demonstration   HOME EXERCISE PROGRAM: Access Code: [email or web address] URL: [email or web address] Date: 07/16/2023 Prepared by: [facility] - Outpatient  Rehab - [facility]  Exercises - Sitting Knee Extension with Resistance  - 1 x daily - 5 x weekly - 3 sets - 10 reps - Seated March with Resistance  - 1 x daily - 5 x weekly - 3 sets - 10 reps - Seated Hip Abduction with Resistance  - 1 x daily - 5 x weekly - 3 sets - 10 reps - Seated Hamstring Curls with Resistance  - 1 x daily - 5 x weekly - 3 sets - 10 reps      Note: Objective measures were completed at Evaluation unless otherwise noted.  DIAGNOSTIC FINDINGS:   Vitals: 107/67 mmHg, 91 bpm  COGNITION: Overall cognitive status: Within functional limits for tasks assessed   SENSATION: Not tested, reports hx of neuropathy in fingers  COORDINATION: Difficulty to rapid alternating movement Heel to shin WNL  EDEMA:  None at present, currently daily weights for CHF  MUSCLE TONE: no hypertonicity noted  MUSCLE LENGTH: WNL  DTRs:  NT  POSTURE: rounded shoulders and forward head  LOWER EXTREMITY ROM:     Active  Right Eval Left Eval   Hip flexion    Hip extension    Hip abduction    Hip adduction    Hip internal rotation    Hip external rotation    Knee flexion    Knee extension    Ankle dorsiflexion    Ankle plantarflexion    Ankle inversion    Ankle eversion     (Blank rows = not tested)  LOWER EXTREMITY MMT:    MMT Right Eval Left Eval  Hip flexion    Hip extension    Hip abduction    Hip adduction    Hip internal rotation    Hip external rotation    Knee flexion    Knee extension    Ankle dorsiflexion    Ankle plantarflexion    Ankle inversion    Ankle eversion    (Blank rows = not tested)  BED MOBILITY:  Independent  TRANSFERS: Independent with sit-stand and chair-chair Floor to stand: reports need for physical assistance after falling or using furniture if intentionally getting on to ground and up again  STAIRS: TBD GAIT: Findings: Comments: ambulates independently level surfaces, no AD in past  FUNCTIONAL TESTS:  Mini-BESTest: TBD Berg Balance Test: 45/56 : TBD  PATIENT SURVEYS:  Freezing of gait questionnaire TBD  TREATMENT DATE:     PATIENT EDUCATION: Education details: assessment details Person educated: Patient and Spouse Education method: Explanation Education comprehension: verbalized understanding  HOME EXERCISE PROGRAM: TBD  GOALS: Goals reviewed with patient? Yes  SHORT TERM GOALS: Target date: 07/21/2023    Patient will be independent in HEP to improve functional outcomes Baseline: Goal status: IN PROGRESS  2.  Demo improved BLE strength and balance per time < 15 sec 5xSTS test Baseline: 20 sec Goal status: IN PROGRESS  3.  Demo improved static balance and reduced risk for falls per score 50/56 Berg Balance Test Baseline: 45/56 Goal status: IN PROGRESS    LONG TERM GOALS: Target date: 08/11/2023    Demo improved  mobility and reduced risk for falls per score 24/28 Mini-BESTest Baseline: 15/28 Goal status: INITIAL  2.  Demo reduced risk for falls per time 4.8 sec Baseline: 8.05 sec Goal status: INITIAL  3.  Patient to ambulate 1100 feet without AD during without lightheadedness.  Baseline: 795 feet without AD. Required sit break at 5 min 30 sec 07/07/23 Goal status: IN PROGRESS 07/07/23  4.  Teach back relevant programs/activities for fitness/exercise for those w/ PD to improve carryover at D/C Baseline:  Goal status: INITIAL   ASSESSMENT:  CLINICAL IMPRESSION: Patient arrived to session with report of some medication changes recently but reports no change in lightheadedness yet. Vitals at start of session were [facility], thus proceeded with session while monitoring sx. Educated pt on safe use of 4WW for use when lightheadedness/balance is more of an issue. Standing activities focused on weight shifting and LE strengthening. Some brief episodes of lightheadedness were reported however BP remained WFL. Patient tolerated session well and without complaints upon leaving.   OBJECTIVE IMPAIRMENTS: Abnormal gait, decreased activity tolerance, decreased balance, decreased coordination, decreased endurance, decreased knowledge of use of DME, difficulty walking, decreased strength, dizziness, and postural dysfunction.   ACTIVITY LIMITATIONS: carrying, lifting, bending, squatting, transfers, reach over head, and locomotion level  PARTICIPATION LIMITATIONS: meal prep, cleaning, laundry, interpersonal relationship, shopping, community activity, and yard work  PERSONAL FACTORS: Age, Time since onset of injury/illness/exacerbation, and 3+ comorbidities: PMH are also affecting patient's functional outcome.   REHAB POTENTIAL: Good  CLINICAL DECISION MAKING: Evolving/moderate complexity  EVALUATION COMPLEXITY: Moderate  PLAN:  PT FREQUENCY: 1-2x/week  PT DURATION: 6 weeks  PLANNED INTERVENTIONS:  [id number]- Physical Performance Testing, 97110-Therapeutic exercises, 97530- Therapeutic activity, [id number]- Neuromuscular re-education, 97535- Self Care, [id number]- Manual therapy, [phone]- Gait training, [phone]- Orthotic Initial, [phone]- Canalith repositioning, and [phone]- Aquatic Therapy  PLAN FOR NEXT SESSION: has pt gotten 4ww? progress endurance and stamina while monitoring BP    Louana Terrilyn Christians, PT, DPT 07/23/23 3:28 PM  [facility] at [facility] [address] [place], [place] [address] Phone # [phone] Fax # [phone]

## 2023-07-23 ENCOUNTER — Encounter: Payer: Self-pay | Admitting: Physical Therapy

## 2023-07-23 ENCOUNTER — Ambulatory Visit: Attending: Adult Health | Admitting: Physical Therapy

## 2023-07-23 DIAGNOSIS — R2689 Other abnormalities of gait and mobility: Secondary | ICD-10-CM | POA: Insufficient documentation

## 2023-07-23 DIAGNOSIS — R262 Difficulty in walking, not elsewhere classified: Secondary | ICD-10-CM | POA: Insufficient documentation

## 2023-07-23 DIAGNOSIS — M542 Cervicalgia: Secondary | ICD-10-CM | POA: Insufficient documentation

## 2023-07-23 DIAGNOSIS — R2681 Unsteadiness on feet: Secondary | ICD-10-CM | POA: Diagnosis not present

## 2023-07-23 DIAGNOSIS — M6281 Muscle weakness (generalized): Secondary | ICD-10-CM | POA: Diagnosis not present

## 2023-07-24 ENCOUNTER — Telehealth: Payer: Self-pay | Admitting: Cardiology

## 2023-07-24 ENCOUNTER — Other Ambulatory Visit (HOSPITAL_COMMUNITY): Payer: Self-pay

## 2023-07-24 MED ORDER — ISOSORBIDE MONONITRATE ER 30 MG PO TB24: 15.0000 mg | ORAL_TABLET | Freq: Every day | ORAL | 3 refills | Status: DC

## 2023-07-24 NOTE — Telephone Encounter (Signed)
 RX sent to requested Pharmacy

## 2023-07-24 NOTE — Telephone Encounter (Signed)
*  STAT* If patient is at the pharmacy, call can be transferred to refill team.   1. Which medications need to be refilled? (please list name of each medication and dose if known)   isosorbide  mononitrate (IMDUR ) 30 MG 24 hr tablet   2. Would you like to learn more about the convenience, safety, & potential cost savings by using the [facility]?   3. Are you open to using the [facility] (Type [facility]. ).  4. Which pharmacy/location (including street and city if local pharmacy) is medication to be sent to?  [id number] - [place], [place] - [address]   5. Do they need a 30 day or 90 day supply?   90 day  Patient stated is completely out of this medication.

## 2023-07-24 NOTE — Therapy (Signed)
 OUTPATIENT PHYSICAL THERAPY NEURO TREATMENT   Patient Name: Anthony [name] MRN: [id number] DOB:[birth year], 78 y.o., male Today's Date: 07/28/2023   PCP: Dohmeier, Dedra REFERRING PROVIDER: Natalia Waddell LABOR, PA-C  END OF SESSION:  PT End of Session - 07/28/23 1527     Visit Number 9    Number of Visits 13    Date for PT Re-Evaluation 08/11/23    Authorization Type [employer]    Progress Note Due on Visit 10    PT Start Time 1446    PT Stop Time 1530    PT Time Calculation (min) 44 min    Equipment Utilized During Treatment Gait belt   wearing compression stockings   Activity Tolerance Patient tolerated treatment well    Behavior During Therapy WFL for tasks assessed/performed               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   Neuromuscular disorder (HCC) 2023   Parkinson's Disease   Neuropathy    left leg greater than right leg   Obesity (BMI 30.0-34.9) 05/14/2013   OSA on CPAP    cpap setting of 10/ 13   Pancreatitis dx march 2016   Pneumonia 12 years ago   Past Surgical History:  Procedure Laterality Date   ANTERIOR CERVICAL DECOMP/DISCECTOMY FUSION N/A 08/02/2022   Procedure: Anterior Cervical Decompression/Discectomy Fusion - Cervical Three-Cervical Four,  Cervical Four-Cervical Five,  remove Cervical Five-Cervical Six Plate;  Surgeon: Joshua Alm Hamilton, MD;  Location: [facility] OR;  Service: Neurosurgery;  Laterality: N/A;  3C   BACK SURGERY  10/2009   Cervical, arterior   CARPAL TUNNEL RELEASE  Left 2003   CARPAL TUNNEL RELEASE Bilateral    CATARACT EXTRACTION Bilateral 01/2012   COLONOSCOPY  2024   CORONARY BALLOON ANGIOPLASTY N/A 08/28/2021   Procedure: CORONARY BALLOON ANGIOPLASTY;  Surgeon: Elmira Newman PARAS, MD;  Location: [facility];  Service: Cardiovascular;  Laterality: N/A;   EUS N/A 07/15/2014   Procedure: FULL UPPER ENDOSCOPIC ULTRASOUND (EUS) RADIAL;  Surgeon: Belvie Just, MD;  Location: [facility] END[facility];  Service: Endoscopy;  Laterality: N/A;   LEFT HEART CATH AND CORONARY ANGIOGRAPHY N/A 05/18/2019   Procedure: LEFT HEART CATH AND CORONARY ANGIOGRAPHY;  Surgeon: Ladona Heinz, MD;  Location: [facility];  Service: Cardiovascular;  Laterality: N/A;   LEFT HEART CATH AND CORONARY ANGIOGRAPHY N/A 08/28/2021   Procedure: LEFT HEART CATH AND CORONARY ANGIOGRAPHY;  Surgeon: Elmira Newman PARAS, MD;  Location: [facility];  Service: Cardiovascular;  Laterality: N/A;   NASAL SINUS SURGERY  1981   RETINAL Bilateral 06/2013   Retinal peel   SHOULDER SURGERY Right 2003   TONSILLECTOMY  1954   Patient Active Problem List   Diagnosis Date Noted   (HFpEF) heart failure with preserved ejection fraction (HCC) 06/24/2023   Mild aortic stenosis 06/24/2023   Hypothyroidism 06/24/2023  Heart failure (HCC) 06/20/2023   Acute right-sided congestive heart failure (HCC) 06/20/2023   Shortness of breath 06/20/2023   Nonrheumatic aortic valve stenosis 02/07/2023   Hypersomnia, persistent 12/26/2022   Nasal congestion 12/26/2022   Post traumatic stress disorder (PTSD) 12/26/2022   S/P cervical spinal fusion 08/02/2022   Parkinsonism (HCC) 12/06/2021   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/06/2021   Hyperkalemia 12/06/2020   CAD (coronary artery disease) 12/03/2019   Esophageal dysphagia 11/21/2019   Gastroesophageal reflux disease 11/21/2019   Family history of colon cancer 11/21/2019   Abnormal stress test 05/11/2019   Mixed hyperlipidemia 05/11/2019    Angina pectoris (HCC) 08/07/2018   Essential hypertension 08/07/2018   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, with long-term current use of insulin  (HCC) 08/07/2018   Morbid obesity (HCC) 08/28/2017   Neuropathy 08/28/2017   Numbness and tingling of both legs below knees 11/10/2013   OSA on CPAP 05/14/2013   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   Obesity (BMI 30.0-34.9) 05/14/2013    ONSET DATE: worse over past several weeks  REFERRING DIAG: R26.89 (ICD-10-CM) - Other abnormalities of gait and mobility  THERAPY DIAG:  Other abnormalities of gait and mobility  Unsteadiness on feet  Muscle weakness (generalized)  Difficulty in walking, not elsewhere classified  Rationale for Evaluation and Treatment: Rehabilitation  SUBJECTIVE:                                                                                                                                                                                             SUBJECTIVE STATEMENT: Had a good 4th.  I feel okay.  Pt accompanied by: significant other  PERTINENT HISTORY: hypertension, hyperlipidemia, CAD, type 2 DM, mild AS, OSA on CPAP, hypothyroidism, Parkinson's disease, GERD.  PAIN:  Are you having pain? No  PRECAUTIONS: Fall  RED FLAGS: None   WEIGHT BEARING RESTRICTIONS: No  FALLS: Has patient fallen in last 6 months? Yes. Number of falls 7  LIVING ENVIRONMENT: Lives with: lives with their family and lives with their spouse Lives in: House/apartment Stairs: Yes, exterior: 4 steps; Ground-floor set-up Has following equipment at home: None  PLOF: Independent with basic ADLs  PATIENT GOALS: improve balance  OBJECTIVE:     TODAY'S TREATMENT: 07/28/23 Activity Comments  Vitals at start of session 99/64 mmHg, 74bpm   Nustep L5 x 7 min UEs/LEs  Maintaining ~75 SPM  standing PWR moves in II bars: Up  rock twist step Cueing for distinct start and end to each  movement, eye boost.  alt step taps 2x30 Few episodes of imbalance required CGA-SBA  4 square step Focus on backwards steps d/t narrow BOS and LOB; 1 episode of LOB requiring mod A  Vitals recheck  96/63 mmHg, 84 bpm   LAQ 8# 2x15 each          HOME EXERCISE PROGRAM: Access Code: [email or web address] URL: [email or web address] Date: 07/16/2023 Prepared by: [facility]  Rehab - [facility]  Exercises - Sitting Knee Extension with Resistance  - 1 x daily - 5 x weekly - 3 sets - 10 reps - Seated March with Resistance  - 1 x daily - 5 x weekly - 3 sets - 10 reps - Seated Hip Abduction with Resistance  - 1 x daily - 5 x weekly - 3 sets - 10 reps - Seated Hamstring Curls with Resistance  - 1 x daily - 5 x weekly - 3 sets - 10 reps      Note: Objective measures were completed at Evaluation unless otherwise noted.  DIAGNOSTIC FINDINGS:   Vitals: 107/67 mmHg, 91 bpm  COGNITION: Overall cognitive status: Within functional limits for tasks assessed   SENSATION: Not tested, reports hx of neuropathy in fingers  COORDINATION: Difficulty to rapid alternating movement Heel to shin WNL  EDEMA:  None at present, currently daily weights for CHF  MUSCLE TONE: no hypertonicity noted  MUSCLE LENGTH: WNL  DTRs:  NT  POSTURE: rounded shoulders and forward head  LOWER EXTREMITY ROM:     Active  Right Eval Left Eval  Hip flexion    Hip extension    Hip abduction    Hip adduction    Hip internal rotation    Hip external rotation    Knee flexion    Knee extension    Ankle dorsiflexion    Ankle plantarflexion    Ankle inversion    Ankle eversion     (Blank rows = not tested)  LOWER EXTREMITY MMT:    MMT Right Eval Left Eval  Hip flexion    Hip extension    Hip abduction    Hip adduction    Hip internal rotation    Hip external rotation    Knee flexion    Knee extension    Ankle dorsiflexion    Ankle plantarflexion    Ankle inversion     Ankle eversion    (Blank rows = not tested)  BED MOBILITY:  Independent  TRANSFERS: Independent with sit-stand and chair-chair Floor to stand: reports need for physical assistance after falling or using furniture if intentionally getting on to ground and up again  STAIRS: TBD GAIT: Findings: Comments: ambulates independently level surfaces, no AD in past  FUNCTIONAL TESTS:  Mini-BESTest: TBD Berg Balance Test: 45/56 : TBD  PATIENT SURVEYS:  Freezing of gait questionnaire TBD                                                                                                                              TREATMENT DATE:  PATIENT EDUCATION: Education details: assessment details Person educated: Patient and Spouse Education method: Explanation Education comprehension: verbalized understanding  HOME EXERCISE PROGRAM: TBD  GOALS: Goals reviewed with patient? Yes  SHORT TERM GOALS: Target date: 07/21/2023    Patient will be independent in HEP to improve functional outcomes Baseline: Goal status: IN PROGRESS  2.  Demo improved BLE strength and balance per time < 15 sec 5xSTS test Baseline: 20 sec Goal status: IN PROGRESS  3.  Demo improved static balance and reduced risk for falls per score 50/56 Berg Balance Test Baseline: 45/56 Goal status: IN PROGRESS    LONG TERM GOALS: Target date: 08/11/2023    Demo improved mobility and reduced risk for falls per score 24/28 Mini-BESTest Baseline: 15/28 Goal status: INITIAL  2.  Demo reduced risk for falls per time 4.8 sec Baseline: 8.05 sec Goal status: INITIAL  3.  Patient to ambulate 1100 feet without AD during without lightheadedness.  Baseline: 795 feet without AD. Required sit break at 5 min 30 sec 07/07/23 Goal status: IN PROGRESS 07/07/23  4.  Teach back relevant programs/activities for fitness/exercise for those w/ PD to improve carryover at D/C Baseline:  Goal status:  INITIAL   ASSESSMENT:  CLINICAL IMPRESSION: Patient arrived to session without complaints. BP still on the lower end of normal at start of session, however patient asymptomatic. Proceeded with session while monitoring symptoms. Patient performed standing PWR moves to improve balance and standing activity/exercise tolerance. Patient tolerated these activities quite well and only required minor cues. Other balance activities revealed LOB with backwards stepping over obstacle, with up to mod A required to correct imbalance. Patient tolerated session well; no remarkable change in BP with activity today.   OBJECTIVE IMPAIRMENTS: Abnormal gait, decreased activity tolerance, decreased balance, decreased coordination, decreased endurance, decreased knowledge of use of DME, difficulty walking, decreased strength, dizziness, and postural dysfunction.   ACTIVITY LIMITATIONS: carrying, lifting, bending, squatting, transfers, reach over head, and locomotion level  PARTICIPATION LIMITATIONS: meal prep, cleaning, laundry, interpersonal relationship, shopping, community activity, and yard work  PERSONAL FACTORS: Age, Time since onset of injury/illness/exacerbation, and 3+ comorbidities: PMH are also affecting patient's functional outcome.   REHAB POTENTIAL: Good  CLINICAL DECISION MAKING: Evolving/moderate complexity  EVALUATION COMPLEXITY: Moderate  PLAN:  PT FREQUENCY: 1-2x/week  PT DURATION: 6 weeks  PLANNED INTERVENTIONS: 97750- Physical Performance Testing, 97110-Therapeutic exercises, 97530- Therapeutic activity, [id number]- Neuromuscular re-education, 97535- Self Care, [id number]- Manual therapy, [phone]- Gait training, [phone]- Orthotic Initial, [phone]- Canalith repositioning, and [phone]- Aquatic Therapy  PLAN FOR NEXT SESSION: PN; check STGs; has pt gotten 4ww? progress endurance and stamina while monitoring BP    Louana Terrilyn Christians, PT, DPT 07/28/23 3:33 PM  [facility] at  [facility] [address], [address] [place], [place] [address] Phone # [phone] Fax # [phone]

## 2023-07-25 ENCOUNTER — Other Ambulatory Visit: Payer: Self-pay | Admitting: Cardiology

## 2023-07-28 ENCOUNTER — Ambulatory Visit: Admitting: Physical Therapy

## 2023-07-28 ENCOUNTER — Encounter: Payer: Self-pay | Admitting: Physical Therapy

## 2023-07-28 DIAGNOSIS — R2689 Other abnormalities of gait and mobility: Secondary | ICD-10-CM | POA: Diagnosis not present

## 2023-07-28 DIAGNOSIS — R2681 Unsteadiness on feet: Secondary | ICD-10-CM

## 2023-07-28 DIAGNOSIS — R262 Difficulty in walking, not elsewhere classified: Secondary | ICD-10-CM

## 2023-07-28 DIAGNOSIS — M6281 Muscle weakness (generalized): Secondary | ICD-10-CM

## 2023-07-30 ENCOUNTER — Ambulatory Visit

## 2023-07-31 DIAGNOSIS — Z794 Long term (current) use of insulin: Secondary | ICD-10-CM | POA: Diagnosis not present

## 2023-07-31 DIAGNOSIS — I110 Hypertensive heart disease with heart failure: Secondary | ICD-10-CM | POA: Diagnosis not present

## 2023-07-31 DIAGNOSIS — I2510 Atherosclerotic heart disease of native coronary artery without angina pectoris: Secondary | ICD-10-CM | POA: Diagnosis not present

## 2023-07-31 DIAGNOSIS — E1140 Type 2 diabetes mellitus with diabetic neuropathy, unspecified: Secondary | ICD-10-CM | POA: Diagnosis not present

## 2023-07-31 DIAGNOSIS — I5030 Unspecified diastolic (congestive) heart failure: Secondary | ICD-10-CM | POA: Diagnosis not present

## 2023-08-04 ENCOUNTER — Ambulatory Visit

## 2023-08-04 DIAGNOSIS — M6281 Muscle weakness (generalized): Secondary | ICD-10-CM

## 2023-08-04 DIAGNOSIS — R2689 Other abnormalities of gait and mobility: Secondary | ICD-10-CM

## 2023-08-04 DIAGNOSIS — R2681 Unsteadiness on feet: Secondary | ICD-10-CM

## 2023-08-04 DIAGNOSIS — R262 Difficulty in walking, not elsewhere classified: Secondary | ICD-10-CM

## 2023-08-04 DIAGNOSIS — M542 Cervicalgia: Secondary | ICD-10-CM

## 2023-08-04 NOTE — Therapy (Signed)
 OUTPATIENT PHYSICAL THERAPY NEURO TREATMENT and Progress Note   Patient Name: Anthony [name] MRN: [id number] DOB:12/22/1945, 78 y.o., male Today's Date: 08/04/2023   PCP: Dohmeier, Dedra MART PROVIDER: Natalia Waddell LABOR, PA-C  Progress Note Reporting Period 06/30/23 to 08/04/23  See note below for Objective Data and Assessment of Progress/Goals.      END OF SESSION:  PT End of Session - 08/04/23 1444     Visit Number 10    Number of Visits 13    Date for PT Re-Evaluation 08/11/23    Authorization Type [employer]    Progress Note Due on Visit 20    PT Start Time 1445    PT Stop Time 1530    PT Time Calculation (min) 45 min    Equipment Utilized During Treatment Gait belt   wearing compression stockings   Activity Tolerance Patient tolerated treatment well    Behavior During Therapy WFL for tasks assessed/performed               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   Neuromuscular disorder (HCC) 2023   Parkinson's Disease   Neuropathy    left leg greater than right leg   Obesity (BMI 30.0-34.9) 05/14/2013   OSA on CPAP    cpap setting of 10/ 13   Pancreatitis dx march 2016   Pneumonia 12 years ago   Past Surgical History:  Procedure Laterality Date   ANTERIOR CERVICAL DECOMP/DISCECTOMY FUSION N/A 08/02/2022   Procedure: Anterior Cervical Decompression/Discectomy Fusion - Cervical Three-Cervical Four,  Cervical Four-Cervical Five,  remove Cervical Five-Cervical Six Plate;  Surgeon: Joshua Alm Hamilton, MD;  Location: [facility] [place] [facility];  Service: Neurosurgery;  Laterality: N/A;  3C   BACK SURGERY  10/2009   Cervical, arterior   CARPAL TUNNEL RELEASE Left 2003   CARPAL TUNNEL RELEASE Bilateral    CATARACT EXTRACTION Bilateral 01/2012   COLONOSCOPY  2024   CORONARY BALLOON ANGIOPLASTY N/A 08/28/2021   Procedure: CORONARY BALLOON ANGIOPLASTY;  Surgeon: Elmira Newman PARAS, MD;  Location: [facility];  Service: Cardiovascular;  Laterality: N/A;   EUS N/A 07/15/2014   Procedure: FULL UPPER ENDOSCOPIC ULTRASOUND (EUS) RADIAL;  Surgeon: Belvie Just, MD;  Location: [facility] ENDOSCOPY;  Service: Endoscopy;  Laterality: N/A;   LEFT HEART CATH AND CORONARY ANGIOGRAPHY N/A 05/18/2019   Procedure: LEFT HEART CATH AND CORONARY ANGIOGRAPHY;  Surgeon: Ladona Heinz, MD;  Location: [facility];  Service: Cardiovascular;  Laterality: N/A;   LEFT HEART CATH AND CORONARY ANGIOGRAPHY N/A 08/28/2021   Procedure: LEFT HEART CATH AND CORONARY ANGIOGRAPHY;  Surgeon: Elmira Newman PARAS, MD;  Location: [facility];  Service: Cardiovascular;  Laterality: N/A;   NASAL SINUS SURGERY  1981   RETINAL Bilateral 06/2013   Retinal peel   SHOULDER SURGERY Right 2003   TONSILLECTOMY  1954   Patient Active Problem List  Diagnosis Date Noted   (HFpEF) heart failure with preserved ejection fraction (HCC) 06/24/2023   Mild aortic stenosis 06/24/2023   Hypothyroidism 06/24/2023   Heart failure (HCC) 06/20/2023   Acute right-sided congestive heart failure (HCC) 06/20/2023   Shortness of breath 06/20/2023   Nonrheumatic aortic valve stenosis 02/07/2023   Hypersomnia, persistent 12/26/2022   Nasal congestion 12/26/2022   Post traumatic stress disorder (PTSD) 12/26/2022   S/P cervical spinal fusion 08/02/2022   Parkinsonism (HCC) 12/06/2021   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/06/2021   Hyperkalemia 12/06/2020   CAD (coronary artery disease) 12/03/2019   Esophageal dysphagia 11/21/2019    Gastroesophageal reflux disease 11/21/2019   Family history of colon cancer 11/21/2019   Abnormal stress test 05/11/2019   Mixed hyperlipidemia 05/11/2019   Angina pectoris (HCC) 08/07/2018   Essential hypertension 08/07/2018   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, with long-term current use of insulin  (HCC) 08/07/2018   Morbid obesity (HCC) 08/28/2017   Neuropathy 08/28/2017   Numbness and tingling of both legs below knees 11/10/2013   OSA on CPAP 05/14/2013   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   Obesity (BMI 30.0-34.9) 05/14/2013    ONSET DATE: worse over past several weeks  REFERRING DIAG: R26.89 (ICD-10-CM) - Other abnormalities of gait and mobility  THERAPY DIAG:  Other abnormalities of gait and mobility  Unsteadiness on feet  Muscle weakness (generalized)  Difficulty in walking, not elsewhere classified  Cervicalgia  Rationale for Evaluation and Treatment: Rehabilitation  SUBJECTIVE:                                                                                                                                                                                             SUBJECTIVE STATEMENT: Doing ok. The lightheadedness still happens if sitting/laying down too long. Have been doing LE exercise before getting up and this seems to help  Pt accompanied by: significant other  PERTINENT HISTORY: hypertension, hyperlipidemia, CAD, type 2 DM, mild AS, OSA on CPAP, hypothyroidism, Parkinson's disease, GERD.  PAIN:  Are you having pain? No  PRECAUTIONS: Fall  RED FLAGS: None   WEIGHT BEARING RESTRICTIONS: No  FALLS: Has patient fallen in last 6 months? Yes. Number of falls 7  LIVING ENVIRONMENT: Lives with: lives with their family and lives with their spouse Lives in: House/apartment Stairs: Yes, exterior: 4 steps; Ground-floor set-up Has following equipment at home: None  PLOF: Independent with basic ADLs  PATIENT  GOALS: improve balance  OBJECTIVE:   TODAY'S TREATMENT: 08/04/23 Activity Comments  Vitals: 94/61 mmHg, 73 bpm sitting  5xSTS test 10.56 sec  Lars  Balance Test 49/56  Mini-squats 2x10   Feet together eyes closed 3x10 sec Eyes closed head movements 2x30 sec   Standing PWR moves 1x10 Consistent cues for form/sequence  Vitals: 87/56 mmHg, 107 bpm standing  Standing on foam -feet apart/together EO/EC x 30 sec, postural perturbations  Retrowalking 4x45 ft CGA w/ trials in wide BOS and external rotation--no retropulsion at end     [facility] PT Assessment - 08/04/23 0001       Standardized Balance Assessment   Five times sit to stand comments  10 sec      Berg Balance Test   Sit to Stand Able to stand without using hands and stabilize independently    Standing Unsupported Able to stand safely 2 minutes    Sitting with Back Unsupported but Feet Supported on Floor or Stool Able to sit safely and securely 2 minutes    Stand to Sit Sits safely with minimal use of hands    Transfers Able to transfer safely, minor use of hands    Standing Unsupported with Eyes Closed Able to stand 10 seconds safely    Standing Unsupported with Feet Together Able to place feet together independently and stand 1 minute safely    From Standing, Reach Forward with Outstretched Arm Can reach confidently >25 cm (10)    From Standing Position, Pick up Object from Floor Able to pick up shoe safely and easily    From Standing Position, Turn to Look Behind Over each Shoulder Looks behind from both sides and weight shifts well    Turn 360 Degrees Able to turn 360 degrees safely but slowly    Standing Unsupported, Alternately Place Feet on Step/Stool Able to stand independently and safely and complete 8 steps in 20 seconds    Standing Unsupported, One Foot in Front Able to take small step independently and hold 30 seconds    Standing on One Leg Tries to lift leg/unable to hold 3 seconds but remains standing independently     Total Score 49            TODAY'S TREATMENT: 07/28/23 Activity Comments  Vitals at start of session 99/64 mmHg, 74bpm   Nustep L5 x 7 min UEs/LEs  Maintaining ~75 SPM  standing PWR moves in II bars: Up  rock twist step Cueing for distinct start and end to each movement, eye boost.  alt step taps 2x30 Few episodes of imbalance required CGA-SBA  4 square step Focus on backwards steps d/t narrow BOS and LOB; 1 episode of LOB requiring mod A  Vitals recheck  96/63 mmHg, 84 bpm   LAQ 8# 2x15 each          HOME EXERCISE PROGRAM: Access Code: [email or web address] URL: [email or web address] Date: 07/16/2023 Prepared by: [facility] - Outpatient  Rehab - [facility]  Exercises - Sitting Knee Extension with Resistance  - 1 x daily - 5 x weekly - 3 sets - 10 reps - Seated March with Resistance  - 1 x daily - 5 x weekly - 3 sets - 10 reps - Seated Hip Abduction with Resistance  - 1 x daily - 5 x weekly - 3 sets - 10 reps - Seated Hamstring Curls with Resistance  - 1 x daily - 5 x weekly - 3 sets - 10 reps - Mini Squat with Counter Support  - 1 x daily - 7 x weekly - 2-3 sets - 10 reps - Feet Together Balance at [employer] Closed  - 1  x daily - 7 x weekly - 3 sets - 10 sec hold - Corner Balance Feet Together: Eyes Closed With Head Turns  - 1 x daily - 7 x weekly - 3 sets - 30 sec hold      Note: Objective measures were completed at Evaluation unless otherwise noted.  DIAGNOSTIC FINDINGS:   Vitals: 107/67 mmHg, 91 bpm  COGNITION: Overall cognitive status: Within functional limits for tasks assessed   SENSATION: Not tested, reports hx of neuropathy in fingers  COORDINATION: Difficulty to rapid alternating movement Heel to shin WNL  EDEMA:  None at present, currently daily weights for CHF  MUSCLE TONE: no hypertonicity noted  MUSCLE LENGTH: WNL  DTRs:  NT  POSTURE: rounded shoulders and forward head  LOWER EXTREMITY ROM:     Active  Right Eval  Left Eval  Hip flexion    Hip extension    Hip abduction    Hip adduction    Hip internal rotation    Hip external rotation    Knee flexion    Knee extension    Ankle dorsiflexion    Ankle plantarflexion    Ankle inversion    Ankle eversion     (Blank rows = not tested)  LOWER EXTREMITY MMT:    MMT Right Eval Left Eval  Hip flexion    Hip extension    Hip abduction    Hip adduction    Hip internal rotation    Hip external rotation    Knee flexion    Knee extension    Ankle dorsiflexion    Ankle plantarflexion    Ankle inversion    Ankle eversion    (Blank rows = not tested)  BED MOBILITY:  Independent  TRANSFERS: Independent with sit-stand and chair-chair Floor to stand: reports need for physical assistance after falling or using furniture if intentionally getting on to ground and up again  STAIRS: TBD GAIT: Findings: Comments: ambulates independently level surfaces, no AD in past  FUNCTIONAL TESTS:  Mini-BESTest: TBD Berg Balance Test: 45/56 : TBD  PATIENT SURVEYS:  Freezing of gait questionnaire TBD                                                                                                                              TREATMENT DATE:     PATIENT EDUCATION: Education details: assessment details Person educated: Patient and Spouse Education method: Explanation Education comprehension: verbalized understanding  HOME EXERCISE PROGRAM: TBD  GOALS: Goals reviewed with patient? Yes  SHORT TERM GOALS: Target date: 07/21/2023    Patient will be independent in HEP to improve functional outcomes Baseline: Goal status: IN PROGRESS  2.  Demo improved BLE strength and balance per time < 15 sec 5xSTS test Baseline: 20 sec; 10 sec Goal status: MET  3.  Demo improved static balance and reduced risk for falls per score 50/56 Berg Balance Test Baseline: 45/56; 49/56 Goal status: IN PROGRESS 08/04/23  LONG TERM GOALS: Target date:  08/11/2023    Demo improved mobility and reduced risk for falls per score 24/28 Mini-BESTest Baseline: 15/28 Goal status: INITIAL  2.  Demo reduced risk for falls per time 4.8 sec Baseline: 8.05 sec Goal status: INITIAL  3.  Patient to ambulate 1100 feet without AD during without lightheadedness.  Baseline: 795 feet without AD. Required sit break at 5 min 30 sec 07/07/23 Goal status: IN PROGRESS 07/07/23  4.  Teach back relevant programs/activities for fitness/exercise for those w/ PD to improve carryover at D/C Baseline:  Goal status: INITIAL   ASSESSMENT:  CLINICAL IMPRESSION: Progress Note details performed/review with improved 5xSTS test in 10 sec w/ one instance retro-LOB but still maintains time for low risk for falls. Berg Balance Test improved from 45 to 49/56 indicating reduce risk for falls 32%.  HEP review with good recall to seated strength Initiated standing HEP additions for LE strength and static balance. One round of standing PWR moves to improve coordination and balance with heavy visual cues for sequence.  Continues to exhibit low BP readings and vitals monitored throughout with appropriate elevation to HR to demands. Improved endurance noted as no seated rest periods required.  Improving postural control with reduce tendency for retropulsion. Continued sessions to progress POC details to improve mobility and reduce risk for falls  OBJECTIVE IMPAIRMENTS: Abnormal gait, decreased activity tolerance, decreased balance, decreased coordination, decreased endurance, decreased knowledge of use of DME, difficulty walking, decreased strength, dizziness, and postural dysfunction.   ACTIVITY LIMITATIONS: carrying, lifting, bending, squatting, transfers, reach over head, and locomotion level  PARTICIPATION LIMITATIONS: meal prep, cleaning, laundry, interpersonal relationship, shopping, community activity, and yard work  PERSONAL FACTORS: Age, Time since onset of  injury/illness/exacerbation, and 3+ comorbidities: PMH are also affecting patient's functional outcome.   REHAB POTENTIAL: Good  CLINICAL DECISION MAKING: Evolving/moderate complexity  EVALUATION COMPLEXITY: Moderate  PLAN:  PT FREQUENCY: 1-2x/week  PT DURATION: 6 weeks  PLANNED INTERVENTIONS: 97750- Physical Performance Testing, 97110-Therapeutic exercises, 97530- Therapeutic activity, [id number]- Neuromuscular re-education, 97535- Self Care, [id number]- Manual therapy, [id number]- Gait training, [phone]- Orthotic Initial, [phone]- Canalith repositioning, and [phone]- Aquatic Therapy  PLAN FOR NEXT SESSION: ; has pt gotten 4ww? progress endurance and stamina while monitoring BP    3:34 PM, 08/04/23 M. Kelly Indira Sorenson, PT, DPT Physical Therapist- [place] Office Number: [phone]

## 2023-08-06 ENCOUNTER — Encounter: Payer: Self-pay | Admitting: Physical Therapy

## 2023-08-06 ENCOUNTER — Ambulatory Visit: Admitting: Physical Therapy

## 2023-08-06 ENCOUNTER — Ambulatory Visit (INDEPENDENT_AMBULATORY_CARE_PROVIDER_SITE_OTHER): Admitting: Neurology

## 2023-08-06 DIAGNOSIS — R2689 Other abnormalities of gait and mobility: Secondary | ICD-10-CM

## 2023-08-06 DIAGNOSIS — M6281 Muscle weakness (generalized): Secondary | ICD-10-CM

## 2023-08-06 DIAGNOSIS — R2681 Unsteadiness on feet: Secondary | ICD-10-CM

## 2023-08-06 DIAGNOSIS — I50813 Acute on chronic right heart failure: Secondary | ICD-10-CM

## 2023-08-06 DIAGNOSIS — G4733 Obstructive sleep apnea (adult) (pediatric): Secondary | ICD-10-CM

## 2023-08-06 DIAGNOSIS — I5032 Chronic diastolic (congestive) heart failure: Secondary | ICD-10-CM

## 2023-08-06 NOTE — Therapy (Signed)
 OUTPATIENT PHYSICAL THERAPY NEURO TREATMENT    Patient Name: Anthony [name] MRN: [id number] DOB:1945-09-06, 78 y.o., male Today's Date: 08/06/2023   PCP: Dohmeier, Dedra REFERRING PROVIDER: Natalia Waddell LABOR, PA-C     END OF SESSION:  PT End of Session - 08/06/23 1445     Visit Number 11    Number of Visits 13    Date for PT Re-Evaluation 08/11/23    Authorization Type [employer]    Progress Note Due on Visit 20    PT Start Time 1447    PT Stop Time 1530    PT Time Calculation (min) 43 min    Equipment Utilized During Treatment Gait belt   wearing compression stockings   Activity Tolerance Patient tolerated treatment well    Behavior During Therapy WFL for tasks assessed/performed                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   Neuromuscular disorder (HCC) 2023   Parkinson's Disease   Neuropathy    left leg greater than right leg   Obesity (BMI 30.0-34.9) 05/14/2013   OSA on CPAP    cpap setting of 10/ 13   Pancreatitis dx march 2016   Pneumonia 12 years ago   Past Surgical History:  Procedure Laterality Date   ANTERIOR CERVICAL DECOMP/DISCECTOMY FUSION N/A 08/02/2022   Procedure: Anterior Cervical Decompression/Discectomy Fusion - Cervical Three-Cervical Four,  Cervical Four-Cervical Five,  remove Cervical Five-Cervical Six Plate;  Surgeon: Joshua Alm Hamilton, MD;  Location: [facility] OR;  Service: Neurosurgery;  Laterality: N/A;  3C   BACK SURGERY  10/2009   Cervical, arterior   CARPAL TUNNEL  RELEASE Left 2003   CARPAL TUNNEL RELEASE Bilateral    CATARACT EXTRACTION Bilateral 01/2012   COLONOSCOPY  2024   CORONARY BALLOON ANGIOPLASTY N/A 08/28/2021   Procedure: CORONARY BALLOON ANGIOPLASTY;  Surgeon: Elmira Newman PARAS, MD;  Location: [facility];  Service: Cardiovascular;  Laterality: N/A;   EUS N/A 07/15/2014   Procedure: FULL UPPER ENDOSCOPIC ULTRASOUND (EUS) RADIAL;  Surgeon: Belvie Just, MD;  Location: [facility];  Service: Endoscopy;  Laterality: N/A;   LEFT HEART CATH AND CORONARY ANGIOGRAPHY N/A 05/18/2019   Procedure: LEFT HEART CATH AND CORONARY ANGIOGRAPHY;  Surgeon: Ladona Heinz, MD;  Location: [facility];  Service: Cardiovascular;  Laterality: N/A;   LEFT HEART CATH AND CORONARY ANGIOGRAPHY N/A 08/28/2021   Procedure: LEFT HEART CATH AND CORONARY ANGIOGRAPHY;  Surgeon: Elmira Newman PARAS, MD;  Location: [facility];  Service: Cardiovascular;  Laterality: N/A;   NASAL SINUS SURGERY  1981   RETINAL Bilateral 06/2013   Retinal peel   SHOULDER SURGERY Right 2003   TONSILLECTOMY  1954   Patient Active Problem List   Diagnosis Date Noted   (HFpEF) heart failure with preserved ejection fraction (HCC) 06/24/2023   Mild aortic stenosis 06/24/2023  Hypothyroidism 06/24/2023   Heart failure (HCC) 06/20/2023   Acute right-sided congestive heart failure (HCC) 06/20/2023   Shortness of breath 06/20/2023   Nonrheumatic aortic valve stenosis 02/07/2023   Hypersomnia, persistent 12/26/2022   Nasal congestion 12/26/2022   Post traumatic stress disorder (PTSD) 12/26/2022   S/P cervical spinal fusion 08/02/2022   Parkinsonism (HCC) 12/06/2021   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/06/2021   Hyperkalemia 12/06/2020   CAD (coronary artery disease) 12/03/2019   Esophageal dysphagia 11/21/2019   Gastroesophageal reflux disease 11/21/2019   Family history of colon cancer 11/21/2019   Abnormal stress test 05/11/2019   Mixed hyperlipidemia  05/11/2019   Angina pectoris (HCC) 08/07/2018   Essential hypertension 08/07/2018   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, with long-term current use of insulin  (HCC) 08/07/2018   Morbid obesity (HCC) 08/28/2017   Neuropathy 08/28/2017   Numbness and tingling of both legs below knees 11/10/2013   OSA on CPAP 05/14/2013   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   Obesity (BMI 30.0-34.9) 05/14/2013    ONSET DATE: worse over past several weeks  REFERRING DIAG: R26.89 (ICD-10-CM) - Other abnormalities of gait and mobility  THERAPY DIAG:  Other abnormalities of gait and mobility  Unsteadiness on feet  Muscle weakness (generalized)  Rationale for Evaluation and Treatment: Rehabilitation  SUBJECTIVE:                                                                                                                                                                                             SUBJECTIVE STATEMENT: Legs are getting stronger.  Still trying to figure out about this dizziness.  They've been working on my medicines-it's gotten better, but now is starting to get a little worse.  Pt accompanied by: significant other  PERTINENT HISTORY: hypertension, hyperlipidemia, CAD, type 2 DM, mild AS, OSA on CPAP, hypothyroidism, Parkinson's disease, GERD.  PAIN:  Are you having pain? No  PRECAUTIONS: Fall  RED FLAGS: None   WEIGHT BEARING RESTRICTIONS: No  FALLS: Has patient fallen in last 6 months? Yes. Number of falls 7  LIVING ENVIRONMENT: Lives with: lives with their family and lives with their spouse Lives in: House/apartment Stairs: Yes, exterior: 4 steps; Ground-floor set-up Has following equipment at home: None  PLOF: Independent with basic ADLs  PATIENT GOALS: improve balance  OBJECTIVE:    TODAY'S TREATMENT: 08/06/2023 Activity Comments  Vitals:  90/59; HR 79 sitting  Seated ankle pumps, LAQ, hip adduction/ball squeezes    Vitals 79/54 Standing after 1 min, no real dizziness  Gait with 4WW 55 ft x 3 reps, supervision No  dizziness, good stability, good posture  Seated NuStep, Level 4-5, 4 extremities x 8 minutes Strengthening intervals, 30-60 sec with increased speeds > 80  Standing balance work in parallel bars:   Forward step over obstacle Forward/back step over obstacle Side step over obstacle Step taps to cones Forward/back walking x 2 min 1-2 UE support  Vitals end of session 91/57, HR 88 bpm No c/o dizziness  Sit to stand, multiple reps within session to transition to activities, with pt stopping upon standing to make sure he feels steady to proceed Able to initiate gait after 5-10 sec of standing     Discussed options for UpWalker/4WW and possibility to trial UpWalker.  Also discussed difficulties UpWalker with pose if pt were to have a low BP/near passing out episode.  Pt has not gotten a 4WW for home yet.  HOME EXERCISE PROGRAM: Access Code: [email or web address] URL: [email or web address] Date: 07/16/2023 Prepared by: [facility] - [facility]  Exercises - Sitting Knee Extension with Resistance  - 1 x daily - 5 x weekly - 3 sets - 10 reps - Seated March with Resistance  - 1 x daily - 5 x weekly - 3 sets - 10 reps - Seated Hip Abduction with Resistance  - 1 x daily - 5 x weekly - 3 sets - 10 reps - Seated Hamstring Curls with Resistance  - 1 x daily - 5 x weekly - 3 sets - 10 reps - Mini Squat with Counter Support  - 1 x daily - 7 x weekly - 2-3 sets - 10 reps - Feet Together Balance at [employer] Eyes Closed  - 1 x daily - 7 x weekly - 3 sets - 10 sec hold - Corner Balance Feet Together: Eyes Closed With Head Turns  - 1 x daily - 7 x weekly - 3 sets - 30 sec hold   PATIENT EDUCATION: Education details: Continue current HEP, continue to monitor BP measures at home Person educated: Patient Education method: Explanation Education comprehension: verbalized  understanding    Note: Objective measures were completed at Evaluation unless otherwise noted.  DIAGNOSTIC FINDINGS:   Vitals: 107/67 mmHg, 91 bpm  COGNITION: Overall cognitive status: Within functional limits for tasks assessed   SENSATION: Not tested, reports hx of neuropathy in fingers  COORDINATION: Difficulty to rapid alternating movement Heel to shin WNL  EDEMA:  None at present, currently daily weights for CHF  MUSCLE TONE: no hypertonicity noted  MUSCLE LENGTH: WNL  DTRs:  NT  POSTURE: rounded shoulders and forward head  LOWER EXTREMITY ROM:     Active  Right Eval Left Eval  Hip flexion    Hip extension    Hip abduction    Hip adduction    Hip internal rotation    Hip external rotation    Knee flexion    Knee extension    Ankle dorsiflexion    Ankle plantarflexion    Ankle inversion    Ankle eversion     (Blank rows = not tested)  LOWER EXTREMITY MMT:    MMT Right Eval Left Eval  Hip flexion    Hip extension    Hip abduction    Hip adduction    Hip internal rotation    Hip external rotation    Knee flexion    Knee extension    Ankle dorsiflexion    Ankle plantarflexion    Ankle inversion    Ankle eversion    (Blank rows = not  tested)  BED MOBILITY:  Independent  TRANSFERS: Independent with sit-stand and chair-chair Floor to stand: reports need for physical assistance after falling or using furniture if intentionally getting on to ground and up again  STAIRS: TBD GAIT: Findings: Comments: ambulates independently level surfaces, no AD in past  FUNCTIONAL TESTS:  Mini-BESTest: TBD Berg Balance Test: 45/56 : TBD  PATIENT SURVEYS:  Freezing of gait questionnaire TBD                                                                                                                              TREATMENT DATE:     PATIENT EDUCATION: Education details: assessment details Person educated: Patient and Spouse Education  method: Explanation Education comprehension: verbalized understanding  HOME EXERCISE PROGRAM: TBD  GOALS: Goals reviewed with patient? Yes  SHORT TERM GOALS: Target date: 07/21/2023    Patient will be independent in HEP to improve functional outcomes Baseline: Goal status: IN PROGRESS  2.  Demo improved BLE strength and balance per time < 15 sec 5xSTS test Baseline: 20 sec; 10 sec Goal status: MET  3.  Demo improved static balance and reduced risk for falls per score 50/56 Berg Balance Test Baseline: 45/56; 49/56 Goal status: IN PROGRESS 08/04/23    LONG TERM GOALS: Target date: 08/11/2023    Demo improved mobility and reduced risk for falls per score 24/28 Mini-BESTest Baseline: 15/28 Goal status: INITIAL  2.  Demo reduced risk for falls per time 4.8 sec Baseline: 8.05 sec Goal status: INITIAL  3.  Patient to ambulate 1100 feet without AD during without lightheadedness.  Baseline: 795 feet without AD. Required sit break at 5 min 30 sec 07/07/23 Goal status: IN PROGRESS 07/07/23  4.  Teach back relevant programs/activities for fitness/exercise for those w/ PD to improve carryover at D/C Baseline:  Goal status: INITIAL   ASSESSMENT:  CLINICAL IMPRESSION: Pt presents today with no new complaints. Skilled PT session focused on exercises and balance activities. Pt is aware to transition slowly between positions and between activities.  He has low BP measures throughout session, but no symptoms of dizziness or lightheadedness, with prolonged standing at parallel bars.  He does hold BUE support for most standing exercises.  Pt will continue to benefit from skilled PT towards goals for improved functional mobility and decreased fall risk.   OBJECTIVE IMPAIRMENTS: Abnormal gait, decreased activity tolerance, decreased balance, decreased coordination, decreased endurance, decreased knowledge of use of DME, difficulty walking, decreased strength, dizziness, and postural  dysfunction.   ACTIVITY LIMITATIONS: carrying, lifting, bending, squatting, transfers, reach over head, and locomotion level  PARTICIPATION LIMITATIONS: meal prep, cleaning, laundry, interpersonal relationship, shopping, community activity, and yard work  PERSONAL FACTORS: Age, Time since onset of injury/illness/exacerbation, and 3+ comorbidities: PMH are also affecting patient's functional outcome.   REHAB POTENTIAL: Good  CLINICAL DECISION MAKING: Evolving/moderate complexity  EVALUATION COMPLEXITY: Moderate  PLAN:  PT FREQUENCY: 1-2x/week  PT DURATION: 6 weeks  PLANNED INTERVENTIONS: 97750- Physical Performance Testing, 97110-Therapeutic exercises, 97530- Therapeutic activity, [id number]- Neuromuscular re-education, 97535- Self Care, [id number]- Manual therapy, [id number]- Gait training, [phone]- Orthotic Initial, [phone]- Canalith repositioning, and [phone]- Aquatic Therapy  PLAN FOR NEXT SESSION:  POC is through 7/21-need to check LTGs and discuss POC. Has pt gotten 4ww? (Not yet-08/06/23);  progress endurance and stamina, balance while monitoring BP    Greig Anon, PT 08/06/23 3:36 PM Phone: [phone] Fax: [phone]  [facility] at [facility] [address], [address] [place], [place] [address] Phone # [phone] Fax # [phone]

## 2023-08-07 ENCOUNTER — Ambulatory Visit: Payer: PPO | Admitting: Adult Health

## 2023-08-07 ENCOUNTER — Encounter: Payer: Self-pay | Admitting: Adult Health

## 2023-08-07 VITALS — BP 93/58 | HR 72 | Ht 70.0 in | Wt 202.0 lb

## 2023-08-07 DIAGNOSIS — R55 Syncope and collapse: Secondary | ICD-10-CM | POA: Diagnosis not present

## 2023-08-07 DIAGNOSIS — R42 Dizziness and giddiness: Secondary | ICD-10-CM

## 2023-08-07 DIAGNOSIS — G4733 Obstructive sleep apnea (adult) (pediatric): Secondary | ICD-10-CM | POA: Diagnosis not present

## 2023-08-07 DIAGNOSIS — R269 Unspecified abnormalities of gait and mobility: Secondary | ICD-10-CM

## 2023-08-07 DIAGNOSIS — G20A1 Parkinson's disease without dyskinesia, without mention of fluctuations: Secondary | ICD-10-CM

## 2023-08-07 NOTE — Patient Instructions (Signed)
 Continue Sinemet  25-100 mg 3 times a day Keep follow-up appointment with cardiology later this month CPAP download shows excellent compliance. Once in-lab study is read they will call with further instructions.

## 2023-08-07 NOTE — Progress Notes (Signed)
 PATIENT: [name] DOB: 1945/09/20  REASON FOR VISIT: follow up HISTORY FROM: patient PRIMARY NEUROLOGIST: Dr. Chalice  Chief Complaint  Patient presents with   Follow-up    Pt in 18 alone Pt here for PD/cpap f/u Pt states BP unstable Pt states balance is off Pt states 3 falls in last six month Pt states at times food gets stuck in throat Pt states swallowing  test  June 2025 Pt states no questions or concerns about cpap machine Pt was admitted  to hospital June 2025 with CHF      HISTORY OF PRESENT ILLNESS: Today 08/07/23:  Kelon Easom is a 78 y.o. male with a history of Parkinson disease, struct of sleep apnea on CPAP. Returns today for follow-up.  Since last seen he was admitted to the hospital for heart failure.  He has since been discharged and followed up with cardiology.  He states that his dizziness is about the same.  He reports they have decreased his Lasix  but not sure that has been helpful.  He does have a follow-up later this month with his cardiologist.  He states that he notices dizziness primarily with positional changes.  In regards to Parkinson's he feels that his symptoms have remained relatively stable.  He notices more tremors in the upper extremities and then the left leg.  He remains in rehab working on strengthening exercises.  He reports that they mention that he may need to use a rollator.  He remains on Sinemet  25-100 mg 3 times a day.  Patient did have a swallow evaluation and they recommended follow-up with GI for esophageal dilation potentially  In regards to sleep apnea he is doing well.  His download is below.  Dr. Chalice did order a repeat in-lab study.  He did this last night.       06/17/23: Siyon Linck is a 78 y.o. male with a history of Parkinson's disease. Returns today for follow-up.  Patient was advised by physical therapy to make an appointment with our office.  When he was at physical therapy he had a 20 point  blood pressure dropped from sitting to standing.  Patient reports that he has been having episodes where he gets dizzy which she describes as feeling lightheaded and then will often blackout.  He reports that this does not immediately happen with a position change but after he has been up for several minutes.  Reports that he was walking to the mailbox and he turned around nest when it felt like he was spinning.  He tried to hold on to a car but fell to the ground.  Fortunately he has not had any injuries from the falls.  He does feel that his balance is off.  He often feels unsteady when ambulating.  Feels that his tremor is worse in the left arm.  He does note that sometimes food and liquids get stuck when he is trying to swallow.  He denies being put on any additional medication that would correlate to his increase in feeling lightheaded and falling.  Patient does admit that he is on several medications and not sure what they were prescribed for.  He returns today for an evaluation.    6/5/24Donald Kenly Xiao is a 78 y.o. male with a history of obstructive sleep apnea on CPAP and Parkinson disease. Returns today for follow-up.  He reports that the CPAP is working well although he just changed out his supplies and he feels that  the hose and water chamber is not working correctly.  PD: tremor comes and goes. Primarily in the LUE. Just recently did PT. Reports in the mornings he feels off balance. Feels this is related to neuropathy. No falls. No trouble swallowing. Reports some acting out dreams- relates to PTSD. [facility] and now only once a week.   '    Mr. Cuny is a 78 year old male with a history of obstructive sleep apnea on CPAP and tremor.  Reports that CPAP is working well.  Denies any new issues.  Tremor: Reports tremor has improved since being on Sinemet .  Currently taking 25-100 mg 3 times a day.  Denies any significant changes in his gait or balance.  States that he is not  walking as much as he was since his dog passed away.  He does have some trouble swallowing food.  He reports that he did have a swallow test with the [facility].  Unsure of the results.  It seems that they also recommended endoscopy this has not been completed yet.  He reports that he is also had his esophagus stretched in the past.    HISTORY (Copied from Dr.Dohmeier's note) Nurse practitioner Morna Ruth mentioned that the patient has noted a slowly progressive tremor that affected his left dominant hand greater than his right this has been present since earlier this year maybe even a little longer but it has progressed since.   The patient also has a history of neuropathy, he does not use socks and has had swelling and redness of the skin in the lower extremities sometimes numbness in the left upper extremity as well.  He has remained compliant with his CPAP use for which she has followed up with our nurse practitioner.  However as of early October this year his tremor has become so high amplitude that the shaking of his left side has left him unable to perform certain maneuvers, affects his fine motor skills.  He also seems to run into things and bumps into objects when he walks.  He drops objects to from his dominant hand.  The patient has a history of diabetes mellitus type 2 diagnosed in 2007, hyperglycemia, basal insulin  was started 2019, he has a history of hypertension angina pectoris and has followed with Dr. Elmira his cardiologist.  Hyperlipidemia hypothyroidism noted in 2008 history of gout and benign prostate hyperplasia, pancreatitis bout in March 2016.  COVID in July 2022. Eye vision loss, 2020, right eye  DM related or stroke. At [facility].     REVIEW OF SYSTEMS: Out of a complete 14 system review of symptoms, the patient complains only of the following symptoms, and all other reviewed systems are negative.  ALLERGIES: Allergies  Allergen Reactions   Lyrica [Pregabalin]     Delirium,  Dizziness, Drowsy   Metformin  Diarrhea    HOME MEDICATIONS: Outpatient Medications Prior to Visit  Medication Sig Dispense Refill   acetaminophen  (TYLENOL ) 500 MG tablet Take 500 mg by mouth every 6 (six) hours as needed for moderate pain.     allopurinol  (ZYLOPRIM ) 300 MG tablet Take 300 mg by mouth in the morning.     aspirin  EC 81 MG tablet Take 81 mg by mouth in the morning. Swallow whole.     busPIRone  (BUSPAR ) 10 MG tablet Take 5 mg by mouth 2 (two) times daily.     carbidopa -levodopa  (SINEMET  IR) 25-100 MG tablet Take 1 tablet by mouth 3 (three) times daily.     carboxymethylcellulose (  REFRESH PLUS) 0.5 % SOLN Place 1 drop into both eyes in the morning and at bedtime.     Cholecalciferol (VITAMIN D-3 PO) Take 1,000 Units by mouth in the morning.     colchicine 0.6 MG tablet Take 0.6 mg by mouth in the morning.     divalproex  (DEPAKOTE  ER) 500 MG 24 hr tablet Take 500 mg by mouth at bedtime.     DULoxetine  (CYMBALTA ) 30 MG capsule Take 30 mg by mouth every evening.     empagliflozin  (JARDIANCE ) 25 MG TABS tablet Take 25 mg by mouth in the morning.     famotidine  (PEPCID ) 40 MG tablet Take 40 mg by mouth daily as needed (gerd).     furosemide  (LASIX ) 80 MG tablet Take 0.5 tablets (40 mg total) by mouth 2 (two) times daily. 90 tablet 3   gabapentin  (NEURONTIN ) 600 MG tablet Take 600 mg by mouth 3 (three) times daily.     Homeopathic Products (THERAWORX RELIEF EX) Apply 1 Application topically daily as needed (leg cramps).     insulin  glargine-yfgn (SEMGLEE ) 100 UNIT/ML Pen Inject 32 Units into the skin at bedtime.     Investigational - Study Medication Inject 1 Dose as directed every 6 (six) months. Study name: Lepodisiran on the Reduction of MACE w/ Elevated Lp(a) in Established CVD or High Risk for CVD (J3L-MC-EZEF-ACCLAIM-Lp(a))  Additional study details: [employer] [phone]     isosorbide  mononitrate (IMDUR ) 30 MG 24 hr tablet Take 0.5 tablets (15 mg total) by  mouth daily. 45 tablet 3   losartan  (COZAAR ) 25 MG tablet Take 0.5 tablets (12.5 mg total) by mouth daily. 45 tablet 3   methocarbamol  (ROBAXIN ) 500 MG tablet Take 1 tablet (500 mg total) by mouth every 6 (six) hours as needed for muscle spasms. 30 tablet 0   metroNIDAZOLE (METROGEL) 0.75 % gel Apply 1 Application topically at bedtime.     mirabegron  ER (MYRBETRIQ ) 50 MG TB24 tablet Take 50 mg by mouth at bedtime.     nitroGLYCERIN  (NITROSTAT ) 0.4 MG SL tablet Place 0.4 mg under the tongue every 5 (five) minutes as needed for chest pain.     pantoprazole  (PROTONIX ) 40 MG tablet Take protonix  40 mg twice daily for 2 months.  Resume once per day after that. 60 tablet 0   pyridOXINE  (VITAMIN B-6) 100 MG tablet Take 100 mg by mouth in the morning.     rosuvastatin  (CRESTOR ) 20 MG tablet TAKE 1 TABLET BY MOUTH EVERY DAY (Patient taking differently: Take 20 mg by mouth at bedtime.) 90 tablet 3   sitaGLIPtin (JANUVIA) 100 MG tablet Take 100 mg by mouth daily.     Sodium Fluoride 1.1 % PSTE Place 1 Application onto teeth 2 (two) times daily.     solifenacin (VESICARE) 5 MG tablet Take 5 mg by mouth in the morning.     SYNTHROID  25 MCG tablet Take 25 mcg by mouth daily before breakfast.     tamsulosin  (FLOMAX ) 0.4 MG CAPS capsule Take 0.4 mg by mouth at bedtime.     traZODone  (DESYREL ) 50 MG tablet Take 50 mg by mouth at bedtime.     White Petrolatum-Mineral Oil (SOOTHE NIGHTTIME) OINT Apply 1 Application to eye at bedtime.     No facility-administered medications prior to visit.    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   Neuromuscular disorder (HCC) 2023   Parkinson's Disease   Neuropathy    left leg greater than right leg   Obesity (BMI 30.0-34.9) 05/14/2013   OSA on CPAP    cpap setting of 10/ 13   Pancreatitis dx march 2016   Pneumonia 12 years ago    PAST SURGICAL HISTORY: Past Surgical History:  Procedure Laterality Date   ANTERIOR CERVICAL DECOMP/DISCECTOMY FUSION N/A 08/02/2022   Procedure: Anterior Cervical Decompression/Discectomy Fusion - Cervical Three-Cervical Four,  Cervical Four-Cervical Five,  remove Cervical Five-Cervical Six Plate;  Surgeon: Joshua Alm Hamilton, MD;  Location: [facility] OR;  Service: Neurosurgery;  Laterality: N/A;  3C   BACK SURGERY  10/2009   Cervical, arterior   CARPAL TUNNEL RELEASE Left 2003   CARPAL TUNNEL RELEASE Bilateral    CATARACT EXTRACTION Bilateral 01/2012   COLONOSCOPY  2024   CORONARY BALLOON ANGIOPLASTY N/A 08/28/2021   Procedure: CORONARY BALLOON ANGIOPLASTY;  Surgeon: Elmira Newman PARAS, MD;  Location: [facility];  Service: Cardiovascular;  Laterality: N/A;   EUS N/A 07/15/2014   Procedure: FULL UPPER ENDOSCOPIC ULTRASOUND (EUS) RADIAL;  Surgeon: Belvie Just, MD;  Location: [facility] ENDOSCOPY;  Service: Endoscopy;  Laterality: N/A;   LEFT HEART CATH AND CORONARY ANGIOGRAPHY N/A 05/18/2019   Procedure: LEFT HEART CATH AND CORONARY ANGIOGRAPHY;  Surgeon: Ladona Heinz, MD;  Location: [facility];  Service: Cardiovascular;  Laterality: N/A;   LEFT HEART CATH AND CORONARY ANGIOGRAPHY N/A 08/28/2021   Procedure: LEFT HEART CATH AND CORONARY ANGIOGRAPHY;  Surgeon: Elmira Newman PARAS, MD;  Location: [facility];  Service: Cardiovascular;  Laterality: N/A;   NASAL SINUS SURGERY  1981   RETINAL Bilateral 06/2013   Retinal peel   SHOULDER SURGERY Right 2003   TONSILLECTOMY  1954    FAMILY HISTORY: Family History  Problem Relation Age of Onset   Heart  disease Mother    Diabetes Mother    Neuropathy Mother    Colon cancer Father    Diabetes Sister    Lung cancer Brother    Sleep apnea Brother    Colon cancer Brother 7   Diabetes Maternal Grandmother    Heart attack Maternal Grandmother    Colon cancer Maternal Grandfather    Heart Problems Maternal Grandfather    Prostate cancer Maternal Grandfather    Cancer Maternal Grandfather        right eye   Heart attack Maternal Grandfather    Stomach cancer Neg Hx    Ulcerative colitis Neg Hx    Esophageal cancer Neg Hx    Parkinson's disease Neg Hx     SOCIAL HISTORY: Social History   Socioeconomic History   Marital status: Married    Spouse name: Verneita   Number of children: 2   Years of education: 12   Highest education level: Not on file  Occupational History   Occupation: [employer]    Employer: [employer]  Tobacco Use   Smoking status: Never   Smokeless tobacco: Never  Vaping Use   Vaping status: Never Used  Substance and Sexual Activity   Alcohol  use: No   Drug use: No   Sexual activity: Not Currently  Other Topics Concern   Not on file  Social History Narrative   Patient is married Thomasine) and lives at home with his wife.   Patient has two children (twins).   Patient is retired.   Patient has a high school education.   Patient does not drink any caffeine.   Patient is left-handed.   Social Drivers of Corporate investment banker Strain: Not on file  Food Insecurity: No Food Insecurity (06/20/2023)   Hunger Vital Sign    Worried About Running Out of Food in the Last Year: Never true    Ran Out of Food in the Last Year: Never true  Transportation Needs: No Transportation Needs (06/20/2023)   PRAPARE - Administrator, Civil Service (Medical): No    Lack of Transportation (Non-Medical): No  Physical Activity: Not on file  Stress: Not on file  Social Connections: Moderately Integrated (06/20/2023)   Social Connection and Isolation Panel     Frequency of Communication with Friends and Family: Three times a week    Frequency of Social Gatherings with Friends and Family: Three times a week    Attends Religious Services: More than 4 times per year    Active Member of Clubs or Organizations: No    Attends Banker Meetings: Never    Marital Status: Married  Catering manager Violence: Not At Risk (06/20/2023)   Humiliation, Afraid, Rape, and Kick questionnaire    Fear of Current or Ex-Partner: No    Emotionally Abused: No    Physically Abused: No    Sexually Abused: No      PHYSICAL EXAM  Vitals:   08/07/23 1356  BP: (!) 93/58  Pulse: 72  Weight: 202 lb (91.6 kg)  Height: 5' 10 (1.778 m)    ' Body mass index is 28.98 kg/m.  Generalized: Well developed, in no acute distress   Neurological examination  Mentation: Alert oriented to time, place, history taking. Follows all commands speech and language fluent Cranial nerve II-XII: Pupils were equal round reactive to light. Extraocular movements were full, visual field were full on confrontational test. Facial sensation and strength were normal. Uvula tongue midline. Head turning and shoulder shrug  were normal and symmetric. Motor: The motor testing reveals 5 over 5 strength of all 4 extremities.  Moderate impairment of finger taps in the upper extremities.   Moderate impairment of rapid alternating movement in the upper extremities Sensory: Sensory testing is intact to soft touch on all 4 extremities. No evidence of extinction is noted.  Coordination: Cerebellar testing reveals good finger-nose-finger and heel-to-shin bilaterally.  Resting and action tremor noted in the left upper extremity. Gait and station: Decreased arm swing with ambulation.   3-4 steps with turns.  Tandem gait not attempted.   DIAGNOSTIC DATA (LABS, IMAGING, TESTING) - I reviewed patient records, labs, notes, testing and imaging myself where available.  Lab Results  Component Value  Date   WBC 8.0 06/20/2023   HGB 13.3 06/20/2023   HCT 40.6 06/20/2023   MCV 86.4 06/20/2023   PLT 262 06/20/2023      Component Value Date/Time   NA 138 07/17/2023 1301   K 4.1 07/17/2023 1301   CL 95 (L) 07/17/2023 1301   CO2 24 07/17/2023 1301   GLUCOSE 121 (H) 07/17/2023 1301  GLUCOSE 90 06/24/2023 0609   BUN 17 07/17/2023 1301   CREATININE 1.10 07/17/2023 1301   CALCIUM  9.7 07/17/2023 1301   PROT 7.4 06/20/2023 1736   PROT 7.6 11/07/2020 1527   ALBUMIN 3.7 06/20/2023 1736   ALBUMIN 4.5 11/07/2020 1527   AST 31 06/20/2023 1736   ALT 25 06/20/2023 1736   ALKPHOS 72 06/20/2023 1736   BILITOT 0.8 06/20/2023 1736   BILITOT 0.3 11/07/2020 1527   GFRNONAA >60 06/24/2023 0609   GFRAA >60 08/17/2019 1643   Lab Results  Component Value Date   CHOL 142 11/28/2021   HDL 34 (L) 11/28/2021   LDLCALC 74 11/28/2021   TRIG 205 (H) 11/28/2021   CHOLHDL 4.2 11/28/2021   Lab Results  Component Value Date   HGBA1C 6.2 (H) 06/21/2023      ASSESSMENT AND PLAN 78 y.o. year old male  has a past medical history of Anginal pain (HCC), Anxiety, Arthritis, BPH (benign prostatic hyperplasia), Coronary artery disease, Depression, Diabetes mellitus type 2, controlled (HCC), Fatty liver, GERD (gastroesophageal reflux disease), Gout, Heart disease, Heart murmur, Hernia, ventral, HTN (hypertension), Hyperlipidemia, Hypothyroidism, Nasal septal deformity (05/14/2013), Neuromuscular disorder (HCC) (2023), Neuropathy, Obesity (BMI 30.0-34.9) (05/14/2013), OSA on CPAP, Pancreatitis (dx march 2016), and Pneumonia (12 years ago). here with :   1.  Parkinson's disease 2.  Dizziness 3.  Abnormality of gait balance 4.  Obstructive sleep apnea on CPAP   Continue Sinemet  25-100 mg 3 times a day Keep follow-up appointment with cardiology later this month CPAP download shows excellent compliance. Once in-lab study is read they will call with further instructions. We will try to move up his  appointment with Dr. Chalice for the next 3 months Advised to call if his symptoms worsen or he develops new symptoms   Duwaine Russell, MSN, NP-C 08/07/2023, 2:24 PM [facility] [address], [address] [place], [place] [address] [phone]  The patient's condition requires frequent monitoring and adjustments in the treatment plan, reflecting the ongoing complexity of care.  This provider is the continuing focal point for all needed services for this condition.

## 2023-08-10 ENCOUNTER — Ambulatory Visit: Payer: Self-pay | Admitting: Neurology

## 2023-08-10 ENCOUNTER — Other Ambulatory Visit: Payer: Self-pay | Admitting: Neurology

## 2023-08-10 DIAGNOSIS — R55 Syncope and collapse: Secondary | ICD-10-CM

## 2023-08-10 DIAGNOSIS — G20C Parkinsonism, unspecified: Secondary | ICD-10-CM

## 2023-08-10 DIAGNOSIS — E66811 Obesity, class 1: Secondary | ICD-10-CM

## 2023-08-10 DIAGNOSIS — G4734 Idiopathic sleep related nonobstructive alveolar hypoventilation: Secondary | ICD-10-CM

## 2023-08-10 DIAGNOSIS — I5032 Chronic diastolic (congestive) heart failure: Secondary | ICD-10-CM

## 2023-08-10 DIAGNOSIS — J342 Deviated nasal septum: Secondary | ICD-10-CM

## 2023-08-10 NOTE — Procedures (Signed)
 [facility] PAP TITRATION INTERPRETATION REPORT   STUDY DATE: 08/06/2023      PATIENT NAME:  Anthony [name]         DATE OF BIRTH:  11-08-1945  PATIENT ID:  [id number]    TYPE OF STUDY:  CPAP  SlLEEP PHYSICIAN: DEDRA GORES, MD Referred by Duwaine Russell, NP  SCORING TECHNICIAN: Delon Sprung, RPSGT   HISTORY:  last seen by Duwaine Russell, NP for a revisit , reportedly struggling with his CPAP and persistent Hypersomnia. CPAP data were reviewed. A new attended study was ordered including new mask fitting.  Anthony [name] is a 78 y.o. male patient with Parkinsons disease who is here for revisit 12/26/2022 for persistent hypersomnia while on CPAP. The patient has PD, haring loss and diabetes, gets many of his medications from the [place].  he has struggled with high insulin  costs and relies on Jardiance  as well. His CPAP is working OK for him,  highly compliant he had some higher residual AHIs but remains under 10/ h. But his air leakage is very, very high on a ResMed P 10 pillow. That one has flimsy headgear. He reportedly sleeps 8.5 hours at night and additional naps throughout the day when he watches Tv, for example.  Reports large air leak. PD may cause some sleepiness too and central apneas.  Acting out dreams was seen last year, identified as PTSD per [place] records, and not REM BD - which is most likely to occur in a PD patient.  Screaming, kicking, fighting. Has once fallen out of bed. The medication has reduced this significantly.  . The Epworth Sleepiness Scale was 19 out of 24 (scores above or equal to 10 are suggestive of hypersomnolence).  DESCRIPTION: A sleep technologist was in attendance for the duration of the recording.  Data collection, scoring, video monitoring, and reporting were performed in compliance with the AASM Manual for the Scoring of Sleep and Associated Events; (Hypopnea is scored based on the criteria listed in Section VIII D. 1b in  the AASM Manual V2.6 using a 4% oxygen desaturation rule or Hypopnea is scored based on the criteria listed in Section VIII D. 1a in the AASM Manual V2.6 using 3% oxygen desaturation and /or arousal rule).  A physician certified by the American Board of Sleep Medicine reviewed each epoch of the study.  ADDITIONAL INFORMATION:  Height: 70.0 in Weight: 233 lb (BMI 33) Neck Size: 21.0 in    MEDICATIONS: Tylenol , Ventolin  HFA, Zyloprim , Aloglipton Benzoate, Norvasc , Aspirin , Buspar , Capsaicin, Sinemet  IR, Refresh Plus eye drops, colchicine, Depakote  ER, Cymbalta , Jardiance , Nexium, Pepcid , Flonase , Lasix , Neurontin , Norco, Semglee , Imdur , Nizoral , Zestril , Robaxin , Toprol  XL, Metrogel, Myrbetriq , Nitrostat , Ditropan  XL, Protonix , Miralax, Vitamin B6, Crestor , Vesicare, Synthroid , Flomax , Desyrel    SLEEP CONTINUITY AND SLEEP ARCHITECTURE: CPAP was initiated under a new mask, a FFM in large by  ResMed , the F 20. Pressure was applied  at 5 cm water with 3 cm EPR and  increased to a final pressure of 15 cm water CPAP. BiPAP was  attempted after the 15 cm water pressure of CPAP did not work out. BiPAP pressure of 16/ 12 cm water reduced the AHI to 5.45/h, there was no hypoxia and 34% REM sleep was seen.    Lights off was at 22:07: and lights on 04:45: (6.6 hours in bed). Total sleep time was 382.0 minutes (100.0% supine;  0.0% lateral;  0.0% prone, 7.6% REM sleep), with a high sleep  efficiency at 96.0%.  Sleep latency was brief at 7.5 minutes.   Of the total sleep time, the percentage of stage N1 sleep was 0.1%, stage N2 sleep was 92.3%, stage N3 sleep was 0.0%, and REM sleep was 7.6%. There were 2 Stage R periods observed on this study night, 0 awakenings (i.e. transitions to Stage W from any sleep stage), and 7.0 total stage transitions. Wake after sleep onset (WASO) time accounted for 08 minutes.  RESPIRATORY MONITORING:  Based on CMS criteria (using a 4% oxygen desaturation rule for scoring hypopneas), there  were 28 apneas (28 obstructive; 0 central; 0 mixed), and 71 hypopneas.   The Apnea index was 4.4/h. The  Hypopnea index was 11.2/h. AHI :  The apnea-hypopnea index was 15.5/h overall (15.5 supine, 0.0 non-supine; 14.5/h in  REM,). There were 0 respiratory effort-related arousals (RERAs).   OXIMETRY: Total sleep time spent at, or below 88% was 18.7 minutes, or 4.9% of total sleep time. Respiratory events were associated with oxyhemoglobin desaturations (nadir during sleep 78%) from a mean of 93%).  EKG:  The average heart rate during sleep was 62 bpm.  The maximum heart rate during sleep was 73 bpm. The maximum heart rate during recording was 73. Unusual EKG pattern with major EEG artefact. BODY POSITION: Duration of total sleep and percent of total sleep in their respective position is as follows: supine 382 minutes (100.0%), Total supine REM sleep time was 29 minutes (100.0% of total REM sleep). LIMB MOVEMENTS: There were 95 periodic limb movements of sleep (14.9/h), of which 6 (0.9/h) were associated with an arousal. AROUSAL: There were 34 arousals in total, for an arousal index of 5.0 arousals/hour.  Of these, 14 were identified as respiratory-related arousals (2.2 /h), 6 were PLM-related arousals (0.9 /h), and 14 were non-specific arousals (2.2 /h)    IMPRESSION:  All supine sleep titration.   1. CPAP at 11 cm water pressure did work well for the apnea control but was seen over 47 minutes with 100% sleep efficiency. higher pressures and lower CPAP pressure did not work out. BiPAP at 16/ 12 cm water reduced the apnea to 5.45 /h , not better than  CPAP did.   2. Total sleep time was within normal limits.  The quality of sleep improved with positive airway pressure.    3. Mild periodic limb movements (PLMS) were observed and these did not intrude into REM sleep.     RECOMMENDATIONS: I like DME to set the patient's current auto- CPAP to 11 cm water with  3 cm EPR, using the new mask F20 in  large ( a FFM by ResMed AirFit).  If the CPAP downloads improve in the upcoming visits, then no further changes will be needed.  If not, I recommend to switch to a BiPAP of 13/ 9 cm water with the new mask as well.   Rv with NP or me within the next 3 months.    DEDRA GORES, MD              [facility] CPAP/Bilevel Report    General Information  Name: Anthony [name], Anthony [name] BMI: 66 Physician: ,   ID: [id number] Height: 70 in Technician: Jackson Nest  Sex: Male Weight: 233 lb Record: [id number]  Age: 65 [[birth year]] Date: 08/06/2023 Scorer: Nest Jackson    Pressure IPAP/EPAP 00 04 05 07 09 11 12 16  / 12   O2 Vol 0.0 0.0 0.0 0.0 0.0 0.0 0.0 0.0  Time TRT  0.45m 34.47m 18.58m 56.21m 28.12m 47.82m 51.86m 30.90m   TST 0.60m 26.54m 18.66m 56.62m 28.53m 47.13m 51.68m 22.60m  Sleep Stage % Wake 0.0 22.1 0.0 0.0 0.0 0.0 0.0 27.9   % REM 0.0 0.0 0.0 0.0 0.0 0.0 27.5 34.1   % N1 0.0 0.0 0.0 0.0 0.0 0.0 0.0 2.3   % N2 0.0 100.0 100.0 100.0 100.0 100.0 72.5 63.6   % N3 0.0 0.0 0.0 0.0 0.0 0.0 0.0 0.0  Respiratory Total Events 0 19 22 23 8  0 7 2   Obs. Apn. 0 4 6 12 1  0 0 0   Mixed Apn. 0 0 0 0 0 0 0 0   Cen. Apn. 0 0 0 0 0 0 0 0   Hypopneas 0 15 16 11 7  0 7 2   AHI 0.00 43.02 73.33 24.42 16.84 0.00 8.24 5.45   Supine AHI 0.00 43.02 73.33 24.42 16.84 0.00 8.24 5.45   Prone AHI 0.00 0.00 0.00 0.00 0.00 0.00 0.00 0.00   Side AHI 0.00 0.00 0.00 0.00 0.00 0.00 0.00 0.00  Respiratory (4%) Hypopneas (4%) 0.00 15.00 16.00 10.00 7.00 0.00 7.00 2.00   AHI (4%) 0.00 43.02 73.33 23.36 16.84 0.00 8.24 5.45   Supine AHI (4%) 0.00 43.02 73.33 23.36 16.84 0.00 8.24 5.45   Prone AHI (4%) 0.00 0.00 0.00 0.00 0.00 0.00 0.00 0.00   Side AHI (4%) 0.00 0.00 0.00 0.00 0.00 0.00 0.00 0.00  Desat Profile <= 90% 0.59m 9.1m 7.63m 37.56m 14.16m 0.10m 1.66m 0.92m   <= 80% 0.3m 0.50m 0.42m 0.78m 0.3m 0.53m 0.19m 0.53m   <= 70% 0.23m 0.23m 0.32m 0.58m 0.28m 0.47m 0.28m 0.38m   <= 60% 0.24m 0.32m  0.68m 0.33m 0.41m 0.38m 0.71m 0.36m  Arousal Index Apnea 0.0 0.0 0.0 1.1 2.1 0.0 0.0 0.0   Hypopnea 0.0 0.0 3.3 2.1 4.2 0.0 1.2 0.0   LM 0.0 0.0 0.0 0.0 0.0 0.0 0.0 2.7   Spontaneous 0.0 0.0 0.0 1.1 6.3 0.0 1.2 5.5   Pressure IPAP/EPAP 13 15   O2 Vol 0.0 0.0  Time TRT 90.61m 42.15m   TST 90.9m 42.32m  Sleep Stage % Wake 0.0 0.0   % REM 8.3 0.0   % N1 0.0 0.0   % N2 91.7 100.0   % N3 0.0 0.0  Respiratory Total Events 8 11   Obs. Apn. 3 2   Mixed Apn. 0 0   Cen. Apn. 0 0   Hypopneas 5 9   AHI 5.33 15.53   Supine AHI 5.33 15.53   Prone AHI 0.00 0.00   Side AHI 0.00 0.00  Respiratory (4%) Hypopneas (4%) 5.00 9.00   AHI (4%) 5.33 15.53   Supine AHI (4%) 5.33 15.53   Prone AHI (4%) 0.00 0.00   Side AHI (4%) 0.00 0.00  Desat Profile <= 90% 2.85m 1.53m   <= 80% 0.41m 0.63m   <= 70% 0.7m 0.39m   <= 60% 0.96m 0.33m  Arousal Index Apnea 0.7 0.0   Hypopnea 1.3 4.2   LM 0.0 7.1   Spontaneous 4.0 2.8    [facility] CPAP Summary    General Information  Name: Anthony [name], Anthony [name] BMI: 33.43 Physician: DEDRA GORES, MD  ID: [id number] Height: 70.0 in Technician: Delon Sprung, RPSGT  Sex: Male Weight: 233.0 lb Record: [id number]  Age: 33 [December 28, 1945] Date: 08/06/2023     Medical & Medication History    HCC, Anxiety, Arthritis, BPH, CAD, Depression, Diabetes Mellitus type  2, Fatty liver, GERD, Gout, Heart disease, HTN, Parkinson, OSA, Hyperlipidemia, Hypothyroidism, Neuropathy, Heart murmur  Tylenol , Ventolin  HFA, Zyloprim , Aloglipton Benzoate, Norvasc , Aspirin , Buspar , Capsaicin, Simemet IR, Refresh Plus eye drops, colchicine, Depakote  ER, Cymbalta , Jardiance , Nexium, Pepcid , Flonase , Lasix , Neurontin , Norco, Semglee , Imdur , Nizoral , Zestril , Robaxin , Toprol  XL, Metrogel, Merbetriq, Nitrostat , Ditropan  XL, Protonix , Miralax, Vitamin B6, Crestor , Vesicare, Synthroid , Flomax , Desyrel    Sleep Disorder   OSA on CPAP   Comments   Patient is a 78 y/o male who  was referred to the sleep lab for a CPAP/BIPAP titration. Patient currently wearing ResMed AirFit F40, patient does not like mask , states it leaks. Tech suggested F20 large FFM, patient liked the fit and style. Patient slept supine entire study. Patient had noted hypopneas and OSA. PLMS noted, snoring as moderate. EKG appeared to be AFIB. Patient started out on 4cm h2o with EPR 3 ended CPAP at 15cm h2o and then switched to BIPAP 16/12cm h2o. Patient tolerated therapy well. Patient did have an RBD episode toward end of night. Patient aroused to use the bathroom. Patient had no complaints and tech answered all questions.    CPAP start time: 10:07:38 PM CPAP end time: 04:45:27 AM   Time Total Supine Side Prone Upright  Recording (TRT) 6h 38.25m 6h 38.85m 0h 0.51m 0h 0.79m 0h 0.106m  Sleep (TST) 6h 22.53m 6h 22.72m 0h 0.30m 0h 0.4m 0h 0.25m   Latency N1 N2 N3 REM Onset Per. Slp. Eff.  Actual 6h 23.66m 0h 7.41m 0h 0.67m 3h 33.58m 0h 7.54m 0h 7.39m 95.98%   Stg Dur Wake N1 N2 N3 REM  Total 7.5 0.5 352.5 0.0 29.0  Supine 7.5 0.5 352.5 0.0 29.0  Side 0.0 0.0 0.0 0.0 0.0  Prone 0.0 0.0 0.0 0.0 0.0  Upright 0.0 0.0 0.0 0.0 0.0   Stg % Wake N1 N2 N3 REM  Total 1.9 0.1 92.3 0.0 7.6  Supine 1.9 0.1 92.3 0.0 7.6  Side 0.0 0.0 0.0 0.0 0.0  Prone 0.0 0.0 0.0 0.0 0.0  Upright 0.0 0.0 0.0 0.0 0.0     Apnea Summary Sub Supine Side Prone Upright  Total 28 Total 28 28 0 0 0    REM 0 0 0 0 0    NREM 28 28 0 0 0  Obs 28 REM 0 0 0 0 0    NREM 28 28 0 0 0  Mix 0 REM 0 0 0 0 0    NREM 0 0 0 0 0  Cen 0 REM 0 0 0 0 0    NREM 0 0 0 0 0   Rera Summary Sub Supine Side Prone Upright  Total 0 Total 0 0 0 0 0    REM 0 0 0 0 0    NREM 0 0 0 0 0   Hypopnea Summary Sub Supine Side Prone Upright  Total 72 Total 72 72 0 0 0    REM 7 7 0 0 0    NREM 65 65 0 0 0   4% Hypopnea Summary Sub Supine Side Prone Upright  Total (4%) 71 Total 71 71 0 0 0    REM 7 7 0 0 0    NREM 64 64 0 0 0     AHI Total Obs Mix Cen  15.71 Apnea  4.40 4.40 0.00 0.00   Hypopnea 11.31 -- -- --  15.55 Hypopnea (4%) 11.15 -- -- --    Total Supine Side Prone Upright  Position AHI 15.71  15.71 0.00 0.00 0.00  REM AHI 14.48   NREM AHI 15.81   Position RDI 15.71 15.71 0.00 0.00 0.00  REM RDI 14.48   NREM RDI 15.81    4% Hypopnea Total Supine Side Prone Upright  Position AHI (4%) 15.55 15.55 0.00 0.00 0.00  REM AHI (4%) 14.48   NREM AHI (4%) 15.64   Position RDI (4%) 15.55 15.55 0.00 0.00 0.00  REM RDI (4%) 14.48   NREM RDI (4%) 15.64    Desaturation Information  <100% <90% <80% <70% <60% <50% <40%  Supine 89 61 1 0 0 0 0  Side 0 0 0 0 0 0 0  Prone 0 0 0 0 0 0 0  Upright 0 0 0 0 0 0 0  Total 89 61 1 0 0 0 0  Desaturation threshold setting: 4% Minimum desaturation setting: 10 seconds SaO2 nadir: 78% The longest event was a 64 sec obstructive Hypopnea with a minimum SaO2 of 86%. The lowest SaO2 was 78% associated with a 22 sec obstructive Apnea. EKG Rates EKG Avg Max Min  Awake 0 0 0  Asleep 62 73 54  EKG Events: N/A Awakening/Arousal Information # of Awakenings 0  Wake after sleep onset 8.35m  Wake after persistent sleep 8.33m   Arousal Assoc. Arousals Index  Apneas 3 0.5  Hypopneas 11 1.7  Leg Movements 6 0.9  Snore 0.0 0.0  PTT Arousals 0 0.0  Spontaneous 14 2.2  Total 32 5.0  Myoclonus Information PLMS LMs Index  Total LMs during PLMS 95 14.9  LMs w/ Microarousals 6 0.9   LM LMs Index  w/ Microarousal 0 0.0  w/ Awakening 0 0.0  w/ Resp Event 0 0.0  Spontaneous 6 0.9  Total 6 0.9

## 2023-08-11 ENCOUNTER — Ambulatory Visit

## 2023-08-11 DIAGNOSIS — R2689 Other abnormalities of gait and mobility: Secondary | ICD-10-CM | POA: Diagnosis not present

## 2023-08-11 DIAGNOSIS — M6281 Muscle weakness (generalized): Secondary | ICD-10-CM

## 2023-08-11 DIAGNOSIS — R2681 Unsteadiness on feet: Secondary | ICD-10-CM

## 2023-08-11 DIAGNOSIS — R262 Difficulty in walking, not elsewhere classified: Secondary | ICD-10-CM

## 2023-08-11 NOTE — Therapy (Signed)
 OUTPATIENT PHYSICAL THERAPY NEURO TREATMENT and Recertification   Patient Name: Anthony [name] MRN: [id number] DOB:Sep 05, 1945, 78 y.o., male Today's Date: 08/11/2023   PCP: Dohmeier, Dedra REFERRING PROVIDER: Natalia Waddell LABOR, PA-C     END OF SESSION:  PT End of Session - 08/11/23 1451     Visit Number 12    Number of Visits 19    Date for PT Re-Evaluation 09/22/23    Authorization Type [employer]    Progress Note Due on Visit 20    PT Start Time 1450    PT Stop Time 1530    PT Time Calculation (min) 40 min    Equipment Utilized During Treatment Gait belt   wearing compression stockings   Activity Tolerance Patient tolerated treatment well    Behavior During Therapy WFL for tasks assessed/performed                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   Neuromuscular disorder (HCC) 2023   Parkinson's Disease   Neuropathy    left leg greater than right leg   Obesity (BMI 30.0-34.9) 05/14/2013   OSA on CPAP    cpap setting of 10/ 13   Pancreatitis dx march 2016   Pneumonia 12 years ago   Past Surgical History:  Procedure Laterality Date   ANTERIOR CERVICAL DECOMP/DISCECTOMY FUSION N/A 08/02/2022   Procedure: Anterior Cervical Decompression/Discectomy Fusion - Cervical Three-Cervical Four,  Cervical Four-Cervical Five,  remove Cervical Five-Cervical Six Plate;  Surgeon: Joshua Alm Hamilton, MD;  Location: [facility] OR;  Service: Neurosurgery;  Laterality: N/A;  3C   BACK SURGERY  10/2009   Cervical,  arterior   CARPAL TUNNEL RELEASE Left 2003   CARPAL TUNNEL RELEASE Bilateral    CATARACT EXTRACTION Bilateral 01/2012   COLONOSCOPY  2024   CORONARY BALLOON ANGIOPLASTY N/A 08/28/2021   Procedure: CORONARY BALLOON ANGIOPLASTY;  Surgeon: Elmira Newman PARAS, MD;  Location: [facility];  Service: Cardiovascular;  Laterality: N/A;   EUS N/A 07/15/2014   Procedure: FULL UPPER ENDOSCOPIC ULTRASOUND (EUS) RADIAL;  Surgeon: Belvie Just, MD;  Location: [facility] ENDOSCOPY;  Service: Endoscopy;  Laterality: N/A;   LEFT HEART CATH AND CORONARY ANGIOGRAPHY N/A 05/18/2019   Procedure: LEFT HEART CATH AND CORONARY ANGIOGRAPHY;  Surgeon: Ladona Heinz, MD;  Location: [facility];  Service: Cardiovascular;  Laterality: N/A;   LEFT HEART CATH AND CORONARY ANGIOGRAPHY N/A 08/28/2021   Procedure: LEFT HEART CATH AND CORONARY ANGIOGRAPHY;  Surgeon: Elmira Newman PARAS, MD;  Location: [facility] CV [facility];  Service: Cardiovascular;  Laterality: N/A;   NASAL SINUS SURGERY  1981   RETINAL Bilateral 06/2013   Retinal peel   SHOULDER SURGERY Right 2003   TONSILLECTOMY  1954   Patient Active Problem List   Diagnosis Date Noted   (HFpEF) heart failure with preserved ejection fraction (HCC) 06/24/2023   Mild aortic stenosis 06/24/2023  Hypothyroidism 06/24/2023   Heart failure (HCC) 06/20/2023   Acute right-sided congestive heart failure (HCC) 06/20/2023   Shortness of breath 06/20/2023   Nonrheumatic aortic valve stenosis 02/07/2023   Hypersomnia, persistent 12/26/2022   Nasal congestion 12/26/2022   Post traumatic stress disorder (PTSD) 12/26/2022   S/P cervical spinal fusion 08/02/2022   Parkinsonism (HCC) 12/06/2021   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/06/2021   Hyperkalemia 12/06/2020   CAD (coronary artery disease) 12/03/2019   Esophageal dysphagia 11/21/2019   Gastroesophageal reflux disease 11/21/2019   Family history of colon cancer 11/21/2019   Abnormal stress test 05/11/2019    Mixed hyperlipidemia 05/11/2019   Angina pectoris (HCC) 08/07/2018   Essential hypertension 08/07/2018   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, with long-term current use of insulin  (HCC) 08/07/2018   Morbid obesity (HCC) 08/28/2017   Neuropathy 08/28/2017   Numbness and tingling of both legs below knees 11/10/2013   OSA on CPAP 05/14/2013   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   Obesity (BMI 30.0-34.9) 05/14/2013    ONSET DATE: worse over past several weeks  REFERRING DIAG: R26.89 (ICD-10-CM) - Other abnormalities of gait and mobility  THERAPY DIAG:  Other abnormalities of gait and mobility  Unsteadiness on feet  Muscle weakness (generalized)  Difficulty in walking, not elsewhere classified  Rationale for Evaluation and Treatment: Rehabilitation  SUBJECTIVE:                                                                                                                                                                                             SUBJECTIVE STATEMENT: Had a bad episode of lightheadedness yesterday when coming out of church  Pt accompanied by: significant other  PERTINENT HISTORY: hypertension, hyperlipidemia, CAD, type 2 DM, mild AS, OSA on CPAP, hypothyroidism, Parkinson's disease, GERD.  PAIN:  Are you having pain? No  PRECAUTIONS: Fall  RED FLAGS: None   WEIGHT BEARING RESTRICTIONS: No  FALLS: Has patient fallen in last 6 months? Yes. Number of falls 7  LIVING ENVIRONMENT: Lives with: lives with their family and lives with their spouse Lives in: House/apartment Stairs: Yes, exterior: 4 steps; Ground-floor set-up Has following equipment at home: None  PLOF: Independent with basic ADLs  PATIENT GOALS: improve balance  OBJECTIVE:   TODAY'S TREATMENT: 08/11/23 Activity Comments  Gait training Trial w/ Up-walker Trial w/ 5TT  MINI-BESTest 19/28  7.34 sec, 7.47, 7.12 sec = 7.31 sec    Review  of STG/LTG            TODAY'S TREATMENT: 08/06/2023 Activity Comments  Vitals:  90/59; HR 79  sitting  Seated ankle pumps, LAQ, hip adduction/ball squeezes   Vitals 79/54 Standing after 1 min, no real dizziness  Gait with 4WW 55 ft x 3 reps, supervision No dizziness, good stability, good posture  Seated NuStep, Level 4-5, 4 extremities x 8 minutes Strengthening intervals, 30-60 sec with increased speeds > 80  Standing balance work in parallel bars:   Forward step over obstacle Forward/back step over obstacle Side step over obstacle Step taps to cones Forward/back walking x 2 min 1-2 UE support  Vitals end of session 91/57, HR 88 bpm No c/o dizziness  Sit to stand, multiple reps within session to transition to activities, with pt stopping upon standing to make sure he feels steady to proceed Able to initiate gait after 5-10 sec of standing     Discussed options for UpWalker/4WW and possibility to trial UpWalker.  Also discussed difficulties UpWalker with pose if pt were to have a low BP/near passing out episode.  Pt has not gotten a 4WW for home yet.  HOME EXERCISE PROGRAM: Access Code: [email or web address] URL: [email or web address] Date: 07/16/2023 Prepared by: [facility] - Outpatient  Rehab - [facility]  Exercises - Sitting Knee Extension with Resistance  - 1 x daily - 5 x weekly - 3 sets - 10 reps - Seated March with Resistance  - 1 x daily - 5 x weekly - 3 sets - 10 reps - Seated Hip Abduction with Resistance  - 1 x daily - 5 x weekly - 3 sets - 10 reps - Seated Hamstring Curls with Resistance  - 1 x daily - 5 x weekly - 3 sets - 10 reps - Mini Squat with Counter Support  - 1 x daily - 7 x weekly - 2-3 sets - 10 reps - Feet Together Balance at [employer] Eyes Closed  - 1 x daily - 7 x weekly - 3 sets - 10 sec hold - Corner Balance Feet Together: Eyes Closed With Head Turns  - 1 x daily - 7 x weekly - 3 sets - 30 sec hold   PATIENT EDUCATION: Education details:  Continue current HEP, continue to monitor BP measures at home Person educated: Patient Education method: Explanation Education comprehension: verbalized understanding    Note: Objective measures were completed at Evaluation unless otherwise noted.  DIAGNOSTIC FINDINGS:   Vitals: 107/67 mmHg, 91 bpm  COGNITION: Overall cognitive status: Within functional limits for tasks assessed   SENSATION: Not tested, reports hx of neuropathy in fingers  COORDINATION: Difficulty to rapid alternating movement Heel to shin WNL  EDEMA:  None at present, currently daily weights for CHF  MUSCLE TONE: no hypertonicity noted  MUSCLE LENGTH: WNL  DTRs:  NT  POSTURE: rounded shoulders and forward head  LOWER EXTREMITY ROM:     Active  Right Eval Left Eval  Hip flexion    Hip extension    Hip abduction    Hip adduction    Hip internal rotation    Hip external rotation    Knee flexion    Knee extension    Ankle dorsiflexion    Ankle plantarflexion    Ankle inversion    Ankle eversion     (Blank rows = not tested)  LOWER EXTREMITY MMT:    MMT Right Eval Left Eval  Hip flexion    Hip extension    Hip abduction    Hip adduction    Hip internal rotation    Hip external rotation    Knee flexion  Knee extension    Ankle dorsiflexion    Ankle plantarflexion    Ankle inversion    Ankle eversion    (Blank rows = not tested)  BED MOBILITY:  Independent  TRANSFERS: Independent with sit-stand and chair-chair Floor to stand: reports need for physical assistance after falling or using furniture if intentionally getting on to ground and up again  STAIRS: TBD GAIT: Findings: Comments: ambulates independently level surfaces, no AD in past  FUNCTIONAL TESTS:  Mini-BESTest: TBD Berg Balance Test: 45/56 : TBD  PATIENT SURVEYS:  Freezing of gait questionnaire TBD                                                                                                                               TREATMENT DATE:     PATIENT EDUCATION: Education details: assessment details Person educated: Patient and Spouse Education method: Explanation Education comprehension: verbalized understanding  HOME EXERCISE PROGRAM: TBD  GOALS: Goals reviewed with patient? Yes  SHORT TERM GOALS: Target date: 07/21/2023    Patient will be independent in HEP to improve functional outcomes Baseline: Goal status: MET  2.  Demo improved BLE strength and balance per time < 15 sec 5xSTS test Baseline: 20 sec; 10 sec Goal status: MET  3.  Demo improved static balance and reduced risk for falls per score 50/56 Berg Balance Test Baseline: 45/56; 49/56 Goal status: IN PROGRESS 08/04/23    LONG TERM GOALS: Target date: 09/22/2023      Demo improved mobility and reduced risk for falls per score 24/28 Mini-BESTest Baseline: 15/28; 19/28 Goal status: IN PROGRESS 08/11/23  2.  Demo reduced risk for falls per time 4.8 sec Baseline: 8.05 sec; 7.31 sec Goal status: IN PROGRESS 08/11/23  3.  Patient to ambulate 1100 feet without AD during without lightheadedness.  Baseline: 795 feet without AD. Required sit break at 5 min 30 sec 07/07/23 Goal status: IN PROGRESS 07/07/23  4.  Teach back relevant programs/activities for fitness/exercise for those w/ PD to improve carryover at D/C Baseline:  Goal status: INITIAL   ASSESSMENT:  CLINICAL IMPRESSION: Gait training w/ AD with best carryover and convenience to rollator with training in device mgmt and set-up and training to increase stride length and for use of seat with episodes of fatigue/lightheaded w/ cues for safety awareness and sequence with position and brakes.  Mini-BESTest performed with improved score from baseline 15 to 19/28 indicating increased risk for falls but making good progress with postural control and righting reactions most prominently.  Pt with ongoing issues of orthostasis affecting balance and activity  tolerance and ongoing education in strategies for mgmt.  Pt would benefit form continued sessions to address deficits and limitations and reduce risk for falls.   OBJECTIVE IMPAIRMENTS: Abnormal gait, decreased activity tolerance, decreased balance, decreased coordination, decreased endurance, decreased knowledge of use of DME, difficulty walking, decreased strength, dizziness, and postural dysfunction.   ACTIVITY LIMITATIONS: carrying, lifting, bending,  squatting, transfers, reach over head, and locomotion level  PARTICIPATION LIMITATIONS: meal prep, cleaning, laundry, interpersonal relationship, shopping, community activity, and yard work  PERSONAL FACTORS: Age, Time since onset of injury/illness/exacerbation, and 3+ comorbidities: PMH are also affecting patient's functional outcome.   REHAB POTENTIAL: Good  CLINICAL DECISION MAKING: Evolving/moderate complexity  EVALUATION COMPLEXITY: Moderate  PLAN:  PT FREQUENCY: 1-2x/week  PT DURATION: 6 weeks  PLANNED INTERVENTIONS: 97750- Physical Performance Testing, 97110-Therapeutic exercises, 97530- Therapeutic activity, [id number]- Neuromuscular re-education, 97535- Self Care, [id number]- Manual therapy, [id number]- Gait training, [phone]- Orthotic Initial, [phone]- Canalith repositioning, and [phone]- Aquatic Therapy  PLAN FOR NEXT SESSION:  Has pt gotten 4ww? (Not yet-08/06/23);  progress endurance and stamina, balance while monitoring BP; w/ device   5:18 PM, 08/11/23 M. Kelly Heddy Vidana, PT, DPT Physical Therapist- [facility] Office Number: [phone]

## 2023-08-13 ENCOUNTER — Encounter: Payer: Self-pay | Admitting: Physical Therapy

## 2023-08-13 ENCOUNTER — Ambulatory Visit: Admitting: Physical Therapy

## 2023-08-13 DIAGNOSIS — R4182 Altered mental status, unspecified: Secondary | ICD-10-CM | POA: Diagnosis not present

## 2023-08-13 DIAGNOSIS — M6281 Muscle weakness (generalized): Secondary | ICD-10-CM

## 2023-08-13 DIAGNOSIS — R2689 Other abnormalities of gait and mobility: Secondary | ICD-10-CM

## 2023-08-13 DIAGNOSIS — R2681 Unsteadiness on feet: Secondary | ICD-10-CM

## 2023-08-13 DIAGNOSIS — I952 Hypotension due to drugs: Secondary | ICD-10-CM | POA: Diagnosis not present

## 2023-08-13 DIAGNOSIS — I959 Hypotension, unspecified: Secondary | ICD-10-CM | POA: Diagnosis not present

## 2023-08-13 DIAGNOSIS — R0902 Hypoxemia: Secondary | ICD-10-CM | POA: Diagnosis not present

## 2023-08-13 DIAGNOSIS — N179 Acute kidney failure, unspecified: Secondary | ICD-10-CM | POA: Diagnosis not present

## 2023-08-13 NOTE — Therapy (Signed)
 OUTPATIENT PHYSICAL THERAPY NEURO TREATMENT   Patient Name: Anthony [name] MRN: [id number] DOB:03-Dec-1945, 78 y.o., male Today's Date: 08/13/2023   PCP: Dohmeier, Dedra REFERRING PROVIDER: Natalia Waddell LABOR, PA-C     END OF SESSION:  PT End of Session - 08/13/23 1447     Visit Number 13    Number of Visits 19    Date for PT Re-Evaluation 09/22/23    Authorization Type [employer]    Progress Note Due on Visit 20    PT Start Time 1449    PT Stop Time 1530    PT Time Calculation (min) 41 min    Equipment Utilized During Treatment Gait belt   wearing compression stockings   Activity Tolerance Patient tolerated treatment well    Behavior During Therapy WFL for tasks assessed/performed                 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   Neuromuscular disorder (HCC) 2023   Parkinson's Disease   Neuropathy    left leg greater than right leg   Obesity (BMI 30.0-34.9) 05/14/2013   OSA on CPAP    cpap setting of 10/ 13   Pancreatitis dx march 2016   Pneumonia 12 years ago   Past Surgical History:  Procedure Laterality Date   ANTERIOR CERVICAL DECOMP/DISCECTOMY FUSION N/A 08/02/2022   Procedure: Anterior Cervical Decompression/Discectomy Fusion - Cervical Three-Cervical Four,  Cervical Four-Cervical Five,  remove Cervical Five-Cervical Six Plate;  Surgeon: Joshua Alm Hamilton, MD;  Location: [facility] OR;  Service: Neurosurgery;  Laterality: N/A;  3C   BACK SURGERY  10/2009   Cervical, arterior   CARPAL TUNNEL  RELEASE Left 2003   CARPAL TUNNEL RELEASE Bilateral    CATARACT EXTRACTION Bilateral 01/2012   COLONOSCOPY  2024   CORONARY BALLOON ANGIOPLASTY N/A 08/28/2021   Procedure: CORONARY BALLOON ANGIOPLASTY;  Surgeon: Elmira Newman PARAS, MD;  Location: [facility];  Service: Cardiovascular;  Laterality: N/A;   EUS N/A 07/15/2014   Procedure: FULL UPPER ENDOSCOPIC ULTRASOUND (EUS) RADIAL;  Surgeon: Belvie Just, MD;  Location: [facility];  Service: Endoscopy;  Laterality: N/A;   LEFT HEART CATH AND CORONARY ANGIOGRAPHY N/A 05/18/2019   Procedure: LEFT HEART CATH AND CORONARY ANGIOGRAPHY;  Surgeon: Ladona Heinz, MD;  Location: [facility];  Service: Cardiovascular;  Laterality: N/A;   LEFT HEART CATH AND CORONARY ANGIOGRAPHY N/A 08/28/2021   Procedure: LEFT HEART CATH AND CORONARY ANGIOGRAPHY;  Surgeon: Elmira Newman PARAS, MD;  Location: [facility];  Service: Cardiovascular;  Laterality: N/A;   NASAL SINUS SURGERY  1981   RETINAL Bilateral 06/2013   Retinal peel   SHOULDER SURGERY Right 2003   TONSILLECTOMY  1954   Patient Active Problem List   Diagnosis Date Noted   (HFpEF) heart failure with preserved ejection fraction (HCC) 06/24/2023   Mild aortic stenosis 06/24/2023  Hypothyroidism 06/24/2023   Heart failure (HCC) 06/20/2023   Acute right-sided congestive heart failure (HCC) 06/20/2023   Shortness of breath 06/20/2023   Nonrheumatic aortic valve stenosis 02/07/2023   Hypersomnia, persistent 12/26/2022   Nasal congestion 12/26/2022   Post traumatic stress disorder (PTSD) 12/26/2022   S/P cervical spinal fusion 08/02/2022   Parkinsonism (HCC) 12/06/2021   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/06/2021   Hyperkalemia 12/06/2020   CAD (coronary artery disease) 12/03/2019   Esophageal dysphagia 11/21/2019   Gastroesophageal reflux disease 11/21/2019   Family history of colon cancer 11/21/2019   Abnormal stress test 05/11/2019   Mixed hyperlipidemia  05/11/2019   Angina pectoris (HCC) 08/07/2018   Essential hypertension 08/07/2018   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, with long-term current use of insulin  (HCC) 08/07/2018   Morbid obesity (HCC) 08/28/2017   Neuropathy 08/28/2017   Numbness and tingling of both legs below knees 11/10/2013   OSA on CPAP 05/14/2013   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   Obesity (BMI 30.0-34.9) 05/14/2013    ONSET DATE: worse over past several weeks  REFERRING DIAG: R26.89 (ICD-10-CM) - Other abnormalities of gait and mobility  THERAPY DIAG:  Other abnormalities of gait and mobility  Unsteadiness on feet  Muscle weakness (generalized)  Rationale for Evaluation and Treatment: Rehabilitation  SUBJECTIVE:                                                                                                                                                                                             SUBJECTIVE STATEMENT: Everything is fine, and I still get that dizziness when I stand up.  BP is still a yo-yo; supposed to go to the cardiologist on 7/31.  Pt accompanied by: significant other  PERTINENT HISTORY: hypertension, hyperlipidemia, CAD, type 2 DM, mild AS, OSA on CPAP, hypothyroidism, Parkinson's disease, GERD.  PAIN:  Are you having pain? No  PRECAUTIONS: Fall  RED FLAGS: None   WEIGHT BEARING RESTRICTIONS: No  FALLS: Has patient fallen in last 6 months? Yes. Number of falls 7  LIVING ENVIRONMENT: Lives with: lives with their family and lives with their spouse Lives in: House/apartment Stairs: Yes, exterior: 4 steps; Ground-floor set-up Has following equipment at home: None  PLOF: Independent with basic ADLs  PATIENT GOALS: improve balance  OBJECTIVE:   Did end up getting the 4WW.    TODAY'S TREATMENT: 08/13/2023 Activity Comments  Vitals: 96/65 HR 73    6 MWT:  778 ft with rollator No rest breaks, does not report dizziness   Vitals:  109/64 HR 80 bpm   Heel/toe raises  2 x 10    Gentle mini-squats to up on toes;  Standing wide BOS UE lifts, trunk rotation with yellow weighted ball Some shakiness with BLEs with squats-better with wider BOS  Standing balance work in parallel bars:   Forward step over obstacle Forward/back step over obstacle Side step over obstacle Step taps to cones Forward/back walking x 2 min BUE>1 UE support         HOME EXERCISE PROGRAM: Access Code: [email or web address] URL: [email or web address] Date: 07/16/2023 Prepared by: [facility] - Outpatient  Rehab - [facility]  Exercises - Sitting Knee Extension with Resistance  - 1 x daily - 5 x weekly - 3 sets - 10 reps - Seated March with Resistance  - 1 x daily - 5 x weekly - 3 sets - 10 reps - Seated Hip Abduction with Resistance  - 1 x daily - 5 x weekly - 3 sets - 10 reps - Seated Hamstring Curls with Resistance  - 1 x daily - 5 x weekly - 3 sets - 10 reps - Mini Squat with Counter Support  - 1 x daily - 7 x weekly - 2-3 sets - 10 reps - Feet Together Balance at [employer] Eyes Closed  - 1 x daily - 7 x weekly - 3 sets - 10 sec hold - Corner Balance Feet Together: Eyes Closed With Head Turns  - 1 x daily - 7 x weekly - 3 sets - 30 sec hold   PATIENT EDUCATION: Education details: Continue current HEP Person educated: Patient Education method: Explanation Education comprehension: verbalized understanding    Note: Objective measures were completed at Evaluation unless otherwise noted.  DIAGNOSTIC FINDINGS:   Vitals: 107/67 mmHg, 91 bpm  COGNITION: Overall cognitive status: Within functional limits for tasks assessed   SENSATION: Not tested, reports hx of neuropathy in fingers  COORDINATION: Difficulty to rapid alternating movement Heel to shin WNL  EDEMA:  None at present, currently daily weights for CHF  MUSCLE TONE: no hypertonicity noted  MUSCLE LENGTH: WNL  DTRs:  NT  POSTURE: rounded  shoulders and forward head  LOWER EXTREMITY ROM:     Active  Right Eval Left Eval  Hip flexion    Hip extension    Hip abduction    Hip adduction    Hip internal rotation    Hip external rotation    Knee flexion    Knee extension    Ankle dorsiflexion    Ankle plantarflexion    Ankle inversion    Ankle eversion     (Blank rows = not tested)  LOWER EXTREMITY MMT:    MMT Right Eval Left Eval  Hip flexion    Hip extension    Hip abduction    Hip adduction    Hip internal rotation    Hip external rotation    Knee flexion    Knee extension    Ankle dorsiflexion    Ankle plantarflexion    Ankle inversion    Ankle eversion    (Blank rows = not tested)  BED MOBILITY:  Independent  TRANSFERS: Independent with sit-stand and chair-chair Floor to stand: reports need for physical assistance after falling or using furniture if intentionally getting on to ground and up again  STAIRS: TBD GAIT: Findings: Comments: ambulates independently level surfaces, no AD in past  FUNCTIONAL TESTS:  Mini-BESTest: TBD Berg Balance Test: 45/56 : TBD  PATIENT SURVEYS:  Freezing of gait questionnaire TBD  TREATMENT DATE:     PATIENT EDUCATION: Education details: assessment details Person educated: Patient and Spouse Education method: Explanation Education comprehension: verbalized understanding  HOME EXERCISE PROGRAM: TBD  GOALS: Goals reviewed with patient? Yes  SHORT TERM GOALS: Target date: 07/21/2023    Patient will be independent in HEP to improve functional outcomes Baseline: Goal status: MET  2.  Demo improved BLE strength and balance per time < 15 sec 5xSTS test Baseline: 20 sec; 10 sec Goal status: MET  3.  Demo improved static balance and reduced risk for falls per score 50/56 Berg Balance Test Baseline: 45/56; 49/56 Goal  status: IN PROGRESS 08/04/23    LONG TERM GOALS: Target date: 09/22/2023      Demo improved mobility and reduced risk for falls per score 24/28 Mini-BESTest Baseline: 15/28; 19/28 Goal status: IN PROGRESS 08/11/23  2.  Demo reduced risk for falls per time 4.8 sec Baseline: 8.05 sec; 7.31 sec Goal status: IN PROGRESS 08/11/23  3.  Patient to ambulate 1100 feet without AD during without lightheadedness.  Baseline: 795 feet without AD. Required sit break at 5 min 30 sec 07/07/23; 778 ft in 6 MWT with rollator, no rest break Goal status: IN PROGRESS 07/07/23  4.  Teach back relevant programs/activities for fitness/exercise for those w/ PD to improve carryover at D/C Baseline:  Goal status: INITIAL   ASSESSMENT:  CLINICAL IMPRESSION: Pt presents today with no new complaints; able to perform 6 WMT today with his rollator, vitals WFL; 6 MWT distance 778 ft with no breaks using rollator.  Pt does report he was able to get 4WW for home.  Skilled PT session focused on standing balance; some minor LOB with mini squats to up on toes-LLE begins to quiver and he is able to quiet this with static tall standing.  Pt will continue to benefit from skilled PT towards goals for improved functional mobility and decreased fall risk.   OBJECTIVE IMPAIRMENTS: Abnormal gait, decreased activity tolerance, decreased balance, decreased coordination, decreased endurance, decreased knowledge of use of DME, difficulty walking, decreased strength, dizziness, and postural dysfunction.   ACTIVITY LIMITATIONS: carrying, lifting, bending, squatting, transfers, reach over head, and locomotion level  PARTICIPATION LIMITATIONS: meal prep, cleaning, laundry, interpersonal relationship, shopping, community activity, and yard work  PERSONAL FACTORS: Age, Time since onset of injury/illness/exacerbation, and 3+ comorbidities: PMH are also affecting patient's functional outcome.   REHAB POTENTIAL: Good  CLINICAL  DECISION MAKING: Evolving/moderate complexity  EVALUATION COMPLEXITY: Moderate  PLAN:  PT FREQUENCY: 1-2x/week  PT DURATION: 6 weeks  PLANNED INTERVENTIONS: 97750- Physical Performance Testing, 97110-Therapeutic exercises, 97530- Therapeutic activity, 97112- Neuromuscular re-education, 97535- Self Care, [id number]- Manual therapy, [phone]- Gait training, [phone]- Orthotic Initial, [phone]- Canalith repositioning, and [phone]- Aquatic Therapy  PLAN FOR NEXT SESSION: Continue balance activities, posture, functional strength; discuss walking program as part of HEP with his 4WW; progress endurance and stamina, balance while monitoring BP  Greig Anon, PT 08/13/23 3:38 PM Phone: [phone] Fax: [phone]  [facility] at [facility] [address] [place], [place] [address] Phone # [phone] Fax # [phone]

## 2023-08-14 ENCOUNTER — Other Ambulatory Visit: Payer: Self-pay | Admitting: *Deleted

## 2023-08-14 DIAGNOSIS — R55 Syncope and collapse: Secondary | ICD-10-CM

## 2023-08-14 NOTE — Telephone Encounter (Signed)
 Nester Bachus D, CMA  Ezzard Kid New orders have been placed for the above pt, DOB: March 26, 2045 Thanks

## 2023-08-15 ENCOUNTER — Encounter (HOSPITAL_COMMUNITY): Payer: Self-pay

## 2023-08-15 ENCOUNTER — Emergency Department (HOSPITAL_COMMUNITY)

## 2023-08-15 ENCOUNTER — Other Ambulatory Visit: Payer: Self-pay

## 2023-08-15 ENCOUNTER — Inpatient Hospital Stay (HOSPITAL_COMMUNITY)
Admission: EM | Admit: 2023-08-15 | Discharge: 2023-08-17 | DRG: 312 | Disposition: A | Discharge status: Home / Self Care | Attending: Family Medicine | Admitting: Family Medicine

## 2023-08-15 DIAGNOSIS — T501X5A Adverse effect of loop [high-ceiling] diuretics, initial encounter: Secondary | ICD-10-CM | POA: Diagnosis present

## 2023-08-15 DIAGNOSIS — I10 Essential (primary) hypertension: Secondary | ICD-10-CM | POA: Diagnosis present

## 2023-08-15 DIAGNOSIS — E1140 Type 2 diabetes mellitus with diabetic neuropathy, unspecified: Secondary | ICD-10-CM | POA: Diagnosis not present

## 2023-08-15 DIAGNOSIS — E860 Dehydration: Secondary | ICD-10-CM | POA: Diagnosis not present

## 2023-08-15 DIAGNOSIS — Z794 Long term (current) use of insulin: Secondary | ICD-10-CM | POA: Diagnosis not present

## 2023-08-15 DIAGNOSIS — Z7984 Long term (current) use of oral hypoglycemic drugs: Secondary | ICD-10-CM

## 2023-08-15 DIAGNOSIS — F419 Anxiety disorder, unspecified: Secondary | ICD-10-CM | POA: Diagnosis present

## 2023-08-15 DIAGNOSIS — I5032 Chronic diastolic (congestive) heart failure: Secondary | ICD-10-CM | POA: Diagnosis not present

## 2023-08-15 DIAGNOSIS — Z79890 Hormone replacement therapy (postmenopausal): Secondary | ICD-10-CM | POA: Diagnosis not present

## 2023-08-15 DIAGNOSIS — Z833 Family history of diabetes mellitus: Secondary | ICD-10-CM

## 2023-08-15 DIAGNOSIS — N400 Enlarged prostate without lower urinary tract symptoms: Secondary | ICD-10-CM | POA: Diagnosis present

## 2023-08-15 DIAGNOSIS — G4733 Obstructive sleep apnea (adult) (pediatric): Secondary | ICD-10-CM | POA: Diagnosis not present

## 2023-08-15 DIAGNOSIS — I2510 Atherosclerotic heart disease of native coronary artery without angina pectoris: Secondary | ICD-10-CM | POA: Diagnosis not present

## 2023-08-15 DIAGNOSIS — R4182 Altered mental status, unspecified: Secondary | ICD-10-CM | POA: Diagnosis not present

## 2023-08-15 DIAGNOSIS — I110 Hypertensive heart disease with heart failure: Secondary | ICD-10-CM | POA: Diagnosis present

## 2023-08-15 DIAGNOSIS — I952 Hypotension due to drugs: Principal | ICD-10-CM | POA: Diagnosis present

## 2023-08-15 DIAGNOSIS — E113391 Type 2 diabetes mellitus with moderate nonproliferative diabetic retinopathy without macular edema, right eye: Secondary | ICD-10-CM | POA: Diagnosis not present

## 2023-08-15 DIAGNOSIS — E039 Hypothyroidism, unspecified: Secondary | ICD-10-CM | POA: Diagnosis not present

## 2023-08-15 DIAGNOSIS — G20C Parkinsonism, unspecified: Secondary | ICD-10-CM | POA: Diagnosis present

## 2023-08-15 DIAGNOSIS — I959 Hypotension, unspecified: Secondary | ICD-10-CM | POA: Diagnosis not present

## 2023-08-15 DIAGNOSIS — Z981 Arthrodesis status: Secondary | ICD-10-CM

## 2023-08-15 DIAGNOSIS — E1165 Type 2 diabetes mellitus with hyperglycemia: Secondary | ICD-10-CM | POA: Diagnosis not present

## 2023-08-15 DIAGNOSIS — R531 Weakness: Secondary | ICD-10-CM | POA: Diagnosis not present

## 2023-08-15 DIAGNOSIS — Z8249 Family history of ischemic heart disease and other diseases of the circulatory system: Secondary | ICD-10-CM | POA: Diagnosis not present

## 2023-08-15 DIAGNOSIS — K219 Gastro-esophageal reflux disease without esophagitis: Secondary | ICD-10-CM | POA: Diagnosis not present

## 2023-08-15 DIAGNOSIS — F39 Unspecified mood [affective] disorder: Secondary | ICD-10-CM | POA: Diagnosis not present

## 2023-08-15 DIAGNOSIS — N179 Acute kidney failure, unspecified: Secondary | ICD-10-CM | POA: Diagnosis not present

## 2023-08-15 DIAGNOSIS — Z9842 Cataract extraction status, left eye: Secondary | ICD-10-CM

## 2023-08-15 DIAGNOSIS — R0902 Hypoxemia: Secondary | ICD-10-CM | POA: Diagnosis not present

## 2023-08-15 DIAGNOSIS — Z7982 Long term (current) use of aspirin: Secondary | ICD-10-CM

## 2023-08-15 DIAGNOSIS — E782 Mixed hyperlipidemia: Secondary | ICD-10-CM | POA: Diagnosis present

## 2023-08-15 DIAGNOSIS — M109 Gout, unspecified: Secondary | ICD-10-CM | POA: Diagnosis present

## 2023-08-15 DIAGNOSIS — Z6827 Body mass index (BMI) 27.0-27.9, adult: Secondary | ICD-10-CM | POA: Diagnosis not present

## 2023-08-15 DIAGNOSIS — F4310 Post-traumatic stress disorder, unspecified: Secondary | ICD-10-CM | POA: Diagnosis present

## 2023-08-15 DIAGNOSIS — E861 Hypovolemia: Secondary | ICD-10-CM

## 2023-08-15 DIAGNOSIS — G20A1 Parkinson's disease without dyskinesia, without mention of fluctuations: Secondary | ICD-10-CM | POA: Diagnosis present

## 2023-08-15 DIAGNOSIS — Z9841 Cataract extraction status, right eye: Secondary | ICD-10-CM

## 2023-08-15 DIAGNOSIS — Z79899 Other long term (current) drug therapy: Secondary | ICD-10-CM

## 2023-08-15 DIAGNOSIS — Z888 Allergy status to other drugs, medicaments and biological substances status: Secondary | ICD-10-CM

## 2023-08-15 LAB — COMPREHENSIVE METABOLIC PANEL WITH GFR
ALT: 16 U/L (ref 0–44)
AST: 30 U/L (ref 15–41)
Albumin: 3.4 g/dL — ABNORMAL LOW (ref 3.5–5.0)
Alkaline Phosphatase: 79 U/L (ref 38–126)
Anion gap: 10 (ref 5–15)
BUN: 31 mg/dL — ABNORMAL HIGH (ref 8–23)
CO2: 24 mmol/L (ref 22–32)
Calcium: 8.9 mg/dL (ref 8.9–10.3)
Chloride: 103 mmol/L (ref 98–111)
Creatinine, Ser: 1.8 mg/dL — ABNORMAL HIGH (ref 0.61–1.24)
GFR, Estimated: 38 mL/min — ABNORMAL LOW
Glucose, Bld: 98 mg/dL (ref 70–99)
Potassium: 3.5 mmol/L (ref 3.5–5.1)
Sodium: 137 mmol/L (ref 135–145)
Total Bilirubin: 0.7 mg/dL (ref 0.0–1.2)
Total Protein: 6.5 g/dL (ref 6.5–8.1)

## 2023-08-15 LAB — I-STAT CHEM 8, ED
BUN: 31 mg/dL — ABNORMAL HIGH (ref 8–23)
Calcium, Ion: 1.19 mmol/L (ref 1.15–1.40)
Chloride: 100 mmol/L (ref 98–111)
Creatinine, Ser: 1.8 mg/dL — ABNORMAL HIGH (ref 0.61–1.24)
Glucose, Bld: 118 mg/dL — ABNORMAL HIGH (ref 70–99)
HCT: 37 % — ABNORMAL LOW (ref 39.0–52.0)
Hemoglobin: 12.6 g/dL — ABNORMAL LOW (ref 13.0–17.0)
Potassium: 3.7 mmol/L (ref 3.5–5.1)
Sodium: 137 mmol/L (ref 135–145)
TCO2: 26 mmol/L (ref 22–32)

## 2023-08-15 LAB — URINALYSIS, W/ REFLEX TO CULTURE (INFECTION SUSPECTED)
Bacteria, UA: NONE SEEN
Bilirubin Urine: NEGATIVE
Glucose, UA: >=500 mg/dL — AB
Hgb urine dipstick: NEGATIVE
Ketones, ur: NEGATIVE mg/dL
Leukocytes,Ua: NEGATIVE
Nitrite: NEGATIVE
Protein, ur: NEGATIVE mg/dL
Specific Gravity, Urine: 1.006 (ref 1.005–1.030)
pH: 6 (ref 5.0–8.0)

## 2023-08-15 LAB — CBC WITH DIFFERENTIAL/PLATELET
Abs Immature Granulocytes: 0.02 K/uL (ref 0.00–0.07)
Basophils Absolute: 0 K/uL (ref 0.0–0.1)
Basophils Relative: 1 %
Eosinophils Absolute: 0.1 K/uL (ref 0.0–0.5)
Eosinophils Relative: 2 %
HCT: 35.7 % — ABNORMAL LOW (ref 39.0–52.0)
Hemoglobin: 11.9 g/dL — ABNORMAL LOW (ref 13.0–17.0)
Immature Granulocytes: 0 %
Lymphocytes Relative: 25 %
Lymphs Abs: 1.8 K/uL (ref 0.7–4.0)
MCH: 29 pg (ref 26.0–34.0)
MCHC: 33.3 g/dL (ref 30.0–36.0)
MCV: 86.9 fL (ref 80.0–100.0)
Monocytes Absolute: 0.5 K/uL (ref 0.1–1.0)
Monocytes Relative: 7 %
Neutro Abs: 4.7 K/uL (ref 1.7–7.7)
Neutrophils Relative %: 65 %
Platelets: 193 K/uL (ref 150–400)
RBC: 4.11 MIL/uL — ABNORMAL LOW (ref 4.22–5.81)
RDW: 17.7 % — ABNORMAL HIGH (ref 11.5–15.5)
WBC: 7.2 K/uL (ref 4.0–10.5)
nRBC: 0 % (ref 0.0–0.2)

## 2023-08-15 LAB — I-STAT CG4 LACTIC ACID, ED: Lactic Acid, Venous: 1.1 mmol/L (ref 0.5–1.9)

## 2023-08-15 LAB — TSH: TSH: 1.45 u[IU]/mL (ref 0.450–4.500)

## 2023-08-15 MED ORDER — LACTATED RINGERS IV SOLN: INTRAVENOUS | Status: AC

## 2023-08-15 MED ORDER — PIPERACILLIN-TAZOBACTAM 3.375 G IVPB 30 MIN
3.3750 g | Freq: Once | INTRAVENOUS | Status: AC
  Administered 2023-08-15: 3.375 g via INTRAVENOUS
  Filled 2023-08-15: qty 50

## 2023-08-15 MED ORDER — VANCOMYCIN HCL 1500 MG/300ML IV SOLN
1500.0000 mg | Freq: Once | INTRAVENOUS | Status: AC
  Administered 2023-08-15: 1500 mg via INTRAVENOUS
  Filled 2023-08-15 (×2): qty 300

## 2023-08-15 MED ORDER — LACTATED RINGERS IV BOLUS
1000.0000 mL | Freq: Once | INTRAVENOUS | Status: AC
  Administered 2023-08-15: 1000 mL via INTRAVENOUS

## 2023-08-15 MED ORDER — LACTATED RINGERS IV BOLUS
500.0000 mL | Freq: Once | INTRAVENOUS | Status: AC
  Administered 2023-08-15: 500 mL via INTRAVENOUS

## 2023-08-15 NOTE — ED Provider Notes (Signed)
 [place] [facility]GENCY DEPARTMENT AT [facility] Provider Note   CSN: [id number] Arrival date & time: 08/15/23  1836   Patient presents with: Hypotension   Anthony [name] is a 78 y.o. male with PMHx of hypertension, hyperlipidemia, CAD, type 2 DM, mild AS, OSA on CPAP, hypothyroidism, Parkinson's disease, and GERD who presents for evaluation after being found altered and hypotensive while sitting outside earlier. Patient's wife states that he went out around early afternoon to sit in a chair near his open garage to enjoy the sun. After about 2.5 hours, she went to check on him and found him leaned all the way back in the chair with his mouth wide open and eyes closed, difficult to arouse upon several attempts. He would wake briefly and mumble but not stay awake. EMS was called and found him hypotensive to 67/38, HR 90, CBG 131. They provided 1L NS and placed patient on 4L [place] for initial sats of 90% on RA.  Upon initial ED evaluation patient is alert and conversational, though he does state he still feels off. He states he can remember parts of his wife/neighbors trying to wake him earlier but the entire event is hazy. He has been having problems recently with hypotension, including having to cancel his PT appts on the day of d/t hypotension. He was recently discharged last month from admission fro HF exacerbation and was told to cut his 25mg  losartan  pills in half but they state the pills are too small to cut without disintegrating. Otherwise denies any recent fevers, cough, dysuria, N/V/D. Denies alcohol  or any illicit substance use recently.   Prior to Admission medications   Medication Sig Start Date End Date Taking? Authorizing Provider  acetaminophen  (TYLENOL ) 500 MG tablet Take 500-1,000 mg by mouth every 6 (six) hours as needed for moderate pain (pain score 4-6).   Yes [provider]  allopurinol  (ZYLOPRIM ) 300 MG tablet Take 300 mg by mouth in the morning. 05/11/13   Yes [provider]  aspirin  EC 81 MG tablet Take 81 mg by mouth in the morning. Swallow whole. 08/06/22  Yes Cosentino, Isaiah SAUNDERS, PA-C  busPIRone  (BUSPAR ) 10 MG tablet Take 5 mg by mouth 2 (two) times daily.   Yes [provider]  carbidopa -levodopa  (SINEMET  IR) 25-100 MG tablet Take 1 tablet by mouth 3 (three) times daily.   Yes [provider]  carboxymethylcellulose (REFRESH PLUS) 0.5 % SOLN Place 1 drop into both eyes in the morning and at bedtime.   Yes [provider]  Cholecalciferol  25 MCG (1000 UT) tablet Take 1,000 Units by mouth daily.   Yes [provider]  colchicine 0.6 MG tablet Take 0.6 mg by mouth daily as needed (Gout Flare up).   Yes [provider]  divalproex  (DEPAKOTE  ER) 250 MG 24 hr tablet Take 250 mg by mouth at bedtime.   Yes [provider]  DULoxetine  (CYMBALTA ) 30 MG capsule Take 30 mg by mouth at bedtime.   Yes [provider]  empagliflozin  (JARDIANCE ) 25 MG TABS tablet Take 25 mg by mouth in the morning.   Yes [provider]  famotidine  (PEPCID ) 40 MG tablet Take 40 mg by mouth daily.   Yes [provider]  furosemide  (LASIX ) 40 MG tablet Take 40 mg by mouth daily. 07/24/23  Yes [provider]  gabapentin  (NEURONTIN ) 600 MG tablet Take 600 mg by mouth 3 (three) times daily.   Yes [provider]  Homeopathic Products [facility] RELIEF  EX) Apply 1 Application topically daily as needed (leg cramps).   Yes [provider]  insulin  glargine (LANTUS ) 100 UNIT/ML Solostar Pen Inject 25 Units into the skin at bedtime. Inject 25 units subcutaneously at bedtime.   Yes [provider]  isosorbide  mononitrate (IMDUR ) 30 MG 24 hr tablet Take 0.5 tablets (15 mg total) by mouth daily. 07/24/23  Yes Patwardhan, Manish J, MD  levothyroxine  (SYNTHROID ) 25 MCG tablet Take 25 mcg by mouth daily before breakfast.   Yes [provider]  losartan  (COZAAR ) 25  MG tablet Take 0.5 tablets (12.5 mg total) by mouth daily. 07/22/23 10/20/23 Yes Weaver, Scott T, PA-C  methocarbamol  (ROBAXIN ) 500 MG tablet Take 1 tablet (500 mg total) by mouth every 6 (six) hours as needed for muscle spasms. 08/03/22  Yes Cosentino, Isaiah SAUNDERS, PA-C  mirabegron  ER (MYRBETRIQ ) 50 MG TB24 tablet Take 50 mg by mouth at bedtime.   Yes [provider]  mupirocin ointment (BACTROBAN) 2 % Apply 1 Application topically 2 (two) times daily as needed (skin irritation). 07/23/23  Yes [provider]  nitroGLYCERIN  (NITROSTAT ) 0.4 MG SL tablet Place 0.4 mg under the tongue every 5 (five) minutes as needed for chest pain.   Yes [provider]  pantoprazole  (PROTONIX ) 40 MG tablet Take protonix  40 mg twice daily for 2 months.  Resume once per day after that. 11/23/19  Yes Mansouraty, Aloha Raddle., MD  pyridOXINE  (VITAMIN B-6) 100 MG tablet Take 100 mg by mouth in the morning.   Yes [provider]  rosuvastatin  (CRESTOR ) 20 MG tablet TAKE 1 TABLET BY MOUTH EVERY DAY Patient taking differently: Take 20 mg by mouth at bedtime. 03/07/23  Yes Patwardhan, Manish J, MD  sitaGLIPtin (JANUVIA) 100 MG tablet Take 100 mg by mouth daily.   Yes [provider]  Sodium Fluoride 1.1 % PSTE Place 1 Application onto teeth 2 (two) times daily.   Yes [provider]  solifenacin (VESICARE) 5 MG tablet Take 5 mg by mouth daily. 07/28/23  Yes [provider]  tamsulosin  (FLOMAX ) 0.4 MG CAPS capsule Take 0.4 mg by mouth at bedtime. 02/16/13  Yes [provider]  traZODone  (DESYREL ) 50 MG tablet Take 25 mg by mouth at bedtime.   Yes [provider]  Teresa Gardner Oil (SOOTHE NIGHTTIME) OINT Apply 1 Application to eye at bedtime.   Yes [provider]    Allergies: Lyrica [pregabalin] and Metformin      Updated Vital Signs BP 102/63 (BP Location: Left Arm)   Pulse 71   Temp 97.6 F (36.4 C) (Oral)   Resp 17   Ht 5' 10  (1.778 m)   Wt 88.5 kg   SpO2 93%   BMI 27.98 kg/m   Physical Exam Vitals reviewed.  Constitutional:      General: He is not in acute distress.    Appearance: He is not toxic-appearing or diaphoretic.  HENT:     Head: Normocephalic and atraumatic.     Nose: Nose normal. No rhinorrhea.     Mouth/Throat:     Mouth: Mucous membranes are moist.     Pharynx: Oropharynx is clear.  Eyes:     General: No visual field deficit or scleral icterus.    Extraocular Movements: Extraocular movements intact.     Pupils: Pupils are equal, round, and reactive to light.  Cardiovascular:     Rate and Rhythm: Normal rate and regular rhythm.     Pulses:  Radial pulses are 1+ on the right side and 1+ on the left side.       Femoral pulses are 2+ on the right side and 2+ on the left side.      Dorsalis pedis pulses are 1+ on the right side and 1+ on the left side.     Heart sounds: No murmur heard.    No gallop.  Pulmonary:     Effort: Pulmonary effort is normal. No respiratory distress.     Breath sounds: Normal breath sounds.  Abdominal:     General: Abdomen is flat.     Palpations: Abdomen is soft.     Tenderness: There is no abdominal tenderness. There is no right CVA tenderness, left CVA tenderness or guarding.  Genitourinary:    Penis: Normal.      Testes: Normal.  Musculoskeletal:        General: No deformity. Normal range of motion.     Cervical back: Normal range of motion and neck supple. No rigidity.     Right lower leg: No edema.     Left lower leg: No edema.  Skin:    General: Skin is warm and dry.     Capillary Refill: Capillary refill takes less than 2 seconds.     Coloration: Skin is not jaundiced or pale.  Neurological:     Mental Status: He is alert and oriented to person, place, and time.     GCS: GCS eye subscore is 4. GCS verbal subscore is 5. GCS motor subscore is 6.     Cranial Nerves: No facial asymmetry.     Sensory: No sensory deficit.     Motor: No  seizure activity or pronator drift.     Comments: Mild dysarthria that patient's wife states is c/w baseline     (all labs ordered are listed, but only abnormal results are displayed) Labs Reviewed  CBC WITH DIFFERENTIAL/PLATELET - Abnormal; Notable for the following components:      Result Value   RBC 4.11 (*)    Hemoglobin 11.9 (*)    HCT 35.7 (*)    RDW 17.7 (*)    All other components within normal limits  COMPREHENSIVE METABOLIC PANEL WITH GFR - Abnormal; Notable for the following components:   BUN 31 (*)    Creatinine, Ser 1.80 (*)    Albumin 3.4 (*)    GFR, Estimated 38 (*)    All other components within normal limits  URINALYSIS, W/ REFLEX TO CULTURE (INFECTION SUSPECTED) - Abnormal; Notable for the following components:   Glucose, UA >=500 (*)    All other components within normal limits  CBC - Abnormal; Notable for the following components:   Hemoglobin 12.2 (*)    HCT 38.6 (*)    RDW 18.1 (*)    All other components within normal limits  COMPREHENSIVE METABOLIC PANEL WITH GFR - Abnormal; Notable for the following components:   Glucose, Bld 139 (*)    BUN 27 (*)    Creatinine, Ser 1.59 (*)    Albumin 3.4 (*)    GFR, Estimated 44 (*)    All other components within normal limits  GLUCOSE, CAPILLARY - Abnormal; Notable for the following components:   Glucose-Capillary 131 (*)    All other components within normal limits  I-STAT CHEM 8, ED - Abnormal; Notable for the following components:   BUN 31 (*)    Creatinine, Ser 1.80 (*)    Glucose, Bld 118 (*)  Hemoglobin 12.6 (*)    HCT 37.0 (*)    All other components within normal limits  CULTURE, BLOOD (ROUTINE X 2)  CULTURE, BLOOD (ROUTINE X 2)  TSH  I-STAT CG4 LACTIC ACID, ED  CBG MONITORING, ED  CBG MONITORING, ED    EKG: None  Radiology: CT Head Wo Contrast Result Date: 08/15/2023 CLINICAL DATA:  AMS, mild dysarthria EXAM: CT HEAD WITHOUT CONTRAST TECHNIQUE: Contiguous axial images were obtained from  the base of the skull through the vertex without intravenous contrast. RADIATION DOSE REDUCTION: This exam was performed according to the departmental dose-optimization program which includes automated exposure control, adjustment of the mA and/or kV according to patient size and/or use of iterative reconstruction technique. COMPARISON:  CTA head/neck 08/21/2022. FINDINGS: Brain: No evidence of acute infarction, hemorrhage, hydrocephalus, extra-axial collection or mass lesion/mass effect. Patchy white matter hypodensities, compatible with chronic microvascular ischemic disease. Vascular: No hyperdense vessel or unexpected calcification. Skull: No acute fracture. Sinuses/Orbits: Mostly clear sinuses.  No acute orbital findings. Other: No mastoid effusions. IMPRESSION: No evidence of acute intracranial abnormality. Electronically Signed   By: Gilmore GORMAN Molt M.D.   On: 08/15/2023 20:17   DG Chest Portable 1 View Result Date: 08/15/2023 CLINICAL DATA:  Hypotension and hypoxia EXAM: PORTABLE CHEST 1 VIEW COMPARISON:  Chest x-ray 06/21/2023 FINDINGS: The heart size and mediastinal contours are within normal limits. Both lungs are clear. Cervical spinal fusion plate present. No acute fractures are seen. IMPRESSION: No active disease. Electronically Signed   By: Greig Pique M.D.   On: 08/15/2023 19:24     Medications Ordered in the ED  lactated ringers  infusion (0 mLs Intravenous Stopped 08/16/23 0050)  allopurinol  (ZYLOPRIM ) tablet 300 mg (300 mg Oral Patient Refused/Not Given 08/16/23 0928)  rosuvastatin  (CRESTOR ) tablet 20 mg (20 mg Oral Given 08/16/23 0045)  busPIRone  (BUSPAR ) tablet 5 mg (5 mg Oral Given 08/16/23 0928)  DULoxetine  (CYMBALTA ) DR capsule 30 mg (has no administration in time range)  traZODone  (DESYREL ) tablet 50 mg (50 mg Oral Given 08/16/23 0049)  levothyroxine  (SYNTHROID ) tablet 25 mcg (25 mcg Oral Given 08/16/23 0641)  pantoprazole  (PROTONIX ) EC tablet 40 mg (40 mg Oral Given 08/16/23  0928)  tamsulosin  (FLOMAX ) capsule 0.4 mg (0.4 mg Oral Given 08/16/23 0047)  carbidopa -levodopa  (SINEMET  IR) 25-100 MG per tablet immediate release 1 tablet (1 tablet Oral Given 08/16/23 0928)  divalproex  (DEPAKOTE  ER) 24 hr tablet 250 mg (250 mg Oral Given 08/16/23 0258)  enoxaparin  (LOVENOX ) injection 40 mg (40 mg Subcutaneous Given 08/16/23 0928)  0.9 %  sodium chloride  infusion ( Intravenous New Bag/Given 08/16/23 0928)  ondansetron  (ZOFRAN ) tablet 4 mg (has no administration in time range)    Or  ondansetron  (ZOFRAN ) injection 4 mg (has no administration in time range)  insulin  aspart (novoLOG ) injection 0-15 Units ( Subcutaneous Not Given 08/16/23 0813)  insulin  aspart (novoLOG ) injection 0-5 Units ( Subcutaneous Not Given 08/16/23 0049)  acetaminophen  (TYLENOL ) tablet 650 mg (has no administration in time range)  lactated ringers  bolus 1,000 mL (0 mLs Intravenous Stopped 08/15/23 2045)  piperacillin -tazobactam (ZOSYN ) IVPB 3.375 g (0 g Intravenous Stopped 08/15/23 2046)  vancomycin  (VANCOREADY) IVPB 1500 mg/300 mL (0 mg Intravenous Stopped 08/15/23 2255)  lactated ringers  bolus 500 mL (0 mLs Intravenous Stopped 08/15/23 2140)    Clinical Course as of 08/16/23 1231  Fri Aug 15, 2023  1944 DG Chest Portable 1 View No active disease. [AD]  2059 CT Head Wo Contrast No evidence of acute intracranial abnormality. [AD]  2143 Creatinine(!): 1.80 AKI from baseline ~1.1. Otherwise unremarkable CMP [AD]  2144 CBC with Differential(!) Mild anemia from normal baseline to 11.9, otherwise unremarkable with no leukocytosis [AD]  2144 Lactic Acid, Venous: 1.1 [AD]    Clinical Course User Index [AD] Raoul Rake, MD    Medical Decision Making Patient with the above history is presenting with acute hypotension and transient but improving AMS starting this afternoon after sitting outside in the sun for 2.5 hours. On scene initially was responsive only to name and difficult to arouse, hypotensive to  67/38, but otherwise normal vitals and CBG. Received 1L NS per EMS. He is not hyperthermic on ED arrival. His mental status is improved in the ED but he remains hypotensive with MAPs persisting in high 50s/low 60s initially.  Differential diagnoses include dehydration with hypovolemia/hypovolemic shock, sepsis of currently unknown source, acute heat illness, dysautonomia d/t Parkinson's, polypharmacy, CVA. Patient overall non-toxic appearing and given history of HF with preserved EF, low concern for cardiogenic shock at this time but will re-evaluate with broad lab workup. Will check blood and urine cultures and start on broad spectrum abx given concern for sepsis.   As above, patient's workup was overall unremarkable with normal lactic, no leukocytosis, and normal CXR/CT head. He does have a notable AKI to 1.8 from baseline 1.1, favoring dehydration/hypovolemia as underlying etiology. Patient received total 2,500cc IV fluids (30cc/kg) and though he had relatively persistent hypotension though majority of ED course, his MAPs did improve to over 65 by the end of last fluid bolus. Decision was made to admit patient given his initial marked hypotension and AKI. Patient accepted to hospitalist team for further inpatient workup/management and was admitted in stable condition.  Amount and/or Complexity of Data Reviewed Labs: ordered. Decision-making details documented in ED Course. Radiology: ordered. Decision-making details documented in ED Course.  Risk Prescription drug management. Decision regarding hospitalization.    Final diagnoses:  Hypotension, unspecified hypotension type  Altered mental status, unspecified altered mental status type  AKI (acute kidney injury) [facility])    ED Discharge Orders     None          Raoul Rake, MD 08/16/23 1231    Freddi Hamilton, MD 08/18/23 [phone]

## 2023-08-15 NOTE — ED Triage Notes (Signed)
 PT BIB [facility] c/o hypotensvie and AMS. EMS reports patient was sitting outside for about 2.5 hours, wife went out to check on him and patient would respond to name.   Pt has been having issues with bp and was told to cut his 25mg  losartan  in half. Reports pill being too small and have not been cutting it in half.    67/38 60/40 manual 90HR 131 CBG   NS 90% RA - placed on 4L- 96%

## 2023-08-15 NOTE — Progress Notes (Signed)
 ED Pharmacy Antibiotic Sign Off An antibiotic consult was received from an ED provider for zosyn and vancomycin per pharmacy dosing for sepsis. A chart review was completed to assess appropriateness.   The following one time order(s) were placed:  Vancomycin 1500 mg IV x 1 Zosyn 3.375g IV x 1  Further antibiotic and/or antibiotic pharmacy consults should be ordered by the admitting provider if indicated.   Thank you for allowing pharmacy to be a part of this patient's care.   Dorn Poot, [facility]  Clinical Pharmacist 08/15/23 7:13 PM

## 2023-08-15 NOTE — H&P (Signed)
 History and Physical    Patient: Anthony [name] FMW:[id number] DOB: 06-21-1945 DOA: 08/15/2023 DOS: the patient was seen and examined on 08/15/2023 PCP: Vernadine Charlie ORN, MD  Patient coming from: Home  Chief Complaint:  Chief Complaint  Patient presents with   Hypotension   HPI: Anthony [name] is a 78 y.o. male with medical history significant of essential hypertension, type 2 diabetes, GERD, morbid obesity, hyperlipidemia, hypothyroidism, Parkinson's disease, diabetic neuropathy, obstructive sleep apnea on CPAP, anxiety disorder, coronary artery disease and BPH who presents to the ER with altered mental status.  Patient was brought in with also significant hypotension.  He is on multiple blood pressure medications and was recently told to take half the dose of his losartan .  He was sitting in the sun for 2-1/2 hours when the wife went out to check on him she found him only responding to name.  He has no focal weakness.  Patient brought to the ER at which point his systolic blood pressure was 67 and diastolic 38.  Blood sugar was stable.  He has received 2-1/2 L of IV normal saline.  Blood pressure has resumed to 98/62 with a MAP of 73.  He is more awake and alert and close to his baseline.  He is deemed not requiring any pressors so he is being admitted to the medical service for further evaluation and treatment.  Review of Systems: As mentioned in the history of present illness. All other systems reviewed and are negative. 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   Neuromuscular disorder (HCC) 2023   Parkinson's Disease   Neuropathy    left leg greater than right leg   Obesity (BMI 30.0-34.9) 05/14/2013   OSA on CPAP    cpap setting of 10/ 13   Pancreatitis dx march 2016   Pneumonia 12 years ago   Past Surgical History:  Procedure Laterality Date   ANTERIOR CERVICAL DECOMP/DISCECTOMY FUSION N/A 08/02/2022   Procedure: Anterior Cervical Decompression/Discectomy Fusion - Cervical Three-Cervical Four,  Cervical Four-Cervical Five,  remove Cervical Five-Cervical Six Plate;  Surgeon: Joshua Alm Hamilton, MD;  Location: [facility] OR;  Service: Neurosurgery;  Laterality: N/A;  3C   BACK SURGERY  10/2009   Cervical, arterior   CARPAL TUNNEL RELEASE Left 2003   CARPAL TUNNEL RELEASE Bilateral    CATARACT EXTRACTION Bilateral 01/2012   COLONOSCOPY  2024   CORONARY BALLOON ANGIOPLASTY N/A 08/28/2021   Procedure: CORONARY BALLOON ANGIOPLASTY;  Surgeon: Elmira Newman PARAS, MD;  Location: [facility] INVASIVE CV LAB;  Service: Cardiovascular;  Laterality: N/A;   EUS N/A 07/15/2014   Procedure: FULL UPPER ENDOSCOPIC ULTRASOUND (EUS) RADIAL;  Surgeon: Belvie Just, MD;  Location: [facility] ENDOSCOPY;  Service: Endoscopy;  Laterality: N/A;   LEFT HEART CATH AND CORONARY ANGIOGRAPHY N/A 05/18/2019   Procedure: LEFT HEART CATH AND CORONARY ANGIOGRAPHY;  Surgeon: Ladona Heinz, MD;  Location: [facility] INVASIVE  CV LAB;  Service: Cardiovascular;  Laterality: N/A;   LEFT HEART CATH AND CORONARY ANGIOGRAPHY N/A 08/28/2021   Procedure: LEFT HEART CATH AND CORONARY ANGIOGRAPHY;  Surgeon: Elmira Newman PARAS, MD;  Location: [facility] INVASIVE CV LAB;  Service: Cardiovascular;  Laterality: N/A;   NASAL SINUS SURGERY  1981   RETINAL Bilateral 06/2013   Retinal peel   SHOULDER SURGERY Right 2003   TONSILLECTOMY  1954   Social History:  reports that he has never smoked. He has never used smokeless tobacco. He reports that he does not drink alcohol  and does not use  drugs.  Allergies  Allergen Reactions   Lyrica [Pregabalin]     Delirium, Dizziness, Drowsy   Metformin  Diarrhea    Family History  Problem Relation Age of Onset   Heart disease Mother    Diabetes Mother    Neuropathy Mother    Colon cancer Father    Diabetes Sister    Lung cancer Brother    Sleep apnea Brother    Colon cancer Brother 7   Diabetes Maternal Grandmother    Heart attack Maternal Grandmother    Colon cancer Maternal Grandfather    Heart Problems Maternal Grandfather    Prostate cancer Maternal Grandfather    Cancer Maternal Grandfather        right eye   Heart attack Maternal Grandfather    Stomach cancer Neg Hx    Ulcerative colitis Neg Hx    Esophageal cancer Neg Hx    Parkinson's disease Neg Hx     Prior to Admission medications   Medication Sig Start Date End Date Taking? Authorizing Provider  acetaminophen  (TYLENOL ) 500 MG tablet Take 500 mg by mouth every 6 (six) hours as needed for moderate pain.    [provider]  allopurinol  (ZYLOPRIM ) 300 MG tablet Take 300 mg by mouth in the morning. 05/11/13   [provider]  aspirin  EC 81 MG tablet Take 81 mg by mouth in the morning. Swallow whole. 08/06/22   Cosentino, Isaiah SAUNDERS, PA-C  busPIRone  (BUSPAR ) 10 MG tablet Take 5 mg by mouth 2 (two) times daily.    [provider]  carbidopa -levodopa  (SINEMET  IR) 25-100 MG tablet Take 1 tablet by mouth 3 (three) times daily.    [provider]  carboxymethylcellulose (REFRESH PLUS) 0.5 % SOLN Place 1 drop into both eyes in the morning and at bedtime.    [provider]  Cholecalciferol (VITAMIN D-3 PO) Take 1,000 Units by mouth in the morning.    [provider]  colchicine 0.6 MG tablet Take 0.6 mg by mouth in the morning.    [provider]  divalproex  (DEPAKOTE  ER) 500 MG 24 hr tablet Take 500 mg by mouth at bedtime.    [provider]  DULoxetine  (CYMBALTA ) 30 MG capsule Take 30 mg by mouth  every evening.    [provider]  empagliflozin  (JARDIANCE ) 25 MG TABS tablet Take 25 mg by mouth in the morning.    [provider]  famotidine  (PEPCID ) 40 MG tablet Take 40 mg by mouth daily as needed (gerd).    [provider]  furosemide  (LASIX ) 80 MG tablet Take 0.5 tablets (40 mg total) by mouth 2 (two) times daily. 07/09/23 10/07/23  Lelon Hamilton T, PA-C  gabapentin  (NEURONTIN ) 600 MG tablet Take 600 mg by mouth 3 (three) times daily.    [provider]  Homeopathic Products [facility] RELIEF EX) Apply 1 Application topically daily as needed (leg cramps).  [provider]  insulin  glargine-yfgn (SEMGLEE ) 100 UNIT/ML Pen Inject 32 Units into the skin at bedtime. 06/06/21   [provider]  Investigational - Study Medication Inject 1 Dose as directed every 6 (six) months. Study name: Lepodisiran on the Reduction of MACE w/ Elevated Lp(a) in Established CVD or High Risk for CVD (J3L-MC-EZEF-ACCLAIM-Lp(a))  Additional study details: [employer] [phone]    [provider]  isosorbide  mononitrate (IMDUR ) 30 MG 24 hr tablet Take 0.5 tablets (15 mg total) by mouth daily. 07/24/23   Patwardhan, Newman PARAS, MD  losartan  (COZAAR ) 25 MG tablet Take 0.5 tablets (12.5 mg total) by mouth daily. 07/22/23 10/20/23  Lelon Hamilton T, PA-C  methocarbamol  (ROBAXIN ) 500 MG tablet Take 1 tablet (500 mg total) by mouth every 6 (six) hours as needed for muscle spasms. 08/03/22   Cosentino, Allison R, PA-C  metroNIDAZOLE (METROGEL) 0.75 % gel Apply 1 Application topically at bedtime. 10/29/19   [provider]  mirabegron  ER (MYRBETRIQ ) 50 MG TB24 tablet Take 50 mg by mouth at bedtime.    [provider]  nitroGLYCERIN  (NITROSTAT ) 0.4 MG SL tablet Place 0.4 mg under the tongue every 5 (five) minutes as needed for chest pain.    [provider]  pantoprazole  (PROTONIX ) 40 MG tablet Take protonix  40 mg twice daily for 2  months.  Resume once per day after that. 11/23/19   Mansouraty, Aloha Raddle., MD  pyridOXINE  (VITAMIN B-6) 100 MG tablet Take 100 mg by mouth in the morning.    [provider]  rosuvastatin  (CRESTOR ) 20 MG tablet TAKE 1 TABLET BY MOUTH EVERY DAY Patient taking differently: Take 20 mg by mouth at bedtime. 03/07/23   Patwardhan, Manish J, MD  sitaGLIPtin (JANUVIA) 100 MG tablet Take 100 mg by mouth daily.    [provider]  Sodium Fluoride 1.1 % PSTE Place 1 Application onto teeth 2 (two) times daily.    [provider]  solifenacin (VESICARE) 5 MG tablet Take 5 mg by mouth in the morning. 07/20/22   [provider]  SYNTHROID  25 MCG tablet Take 25 mcg by mouth daily before breakfast. 05/11/13   [provider]  tamsulosin  (FLOMAX ) 0.4 MG CAPS capsule Take 0.4 mg by mouth at bedtime. 02/16/13   [provider]  traZODone  (DESYREL ) 50 MG tablet Take 50 mg by mouth at bedtime.    [provider]  Teresa Gardner Oil (SOOTHE NIGHTTIME) OINT Apply 1 Application to eye at bedtime.    [provider]    Physical Exam: Vitals:   08/15/23 2230 08/15/23 2245 08/15/23 2300 08/15/23 2315  BP: 95/60 94/61 96/62  101/60  Pulse: 65 66 75 71  Resp: 12 15 20 16   Temp:      TempSrc:      SpO2: 100% 98% 100% 100%  Weight:      Height:       Constitutional: Morbidly obese, NAD, calm, comfortable Eyes: PERRL, lids and conjunctivae normal ENMT: Mucous membranes are dry. Posterior pharynx clear of any exudate or lesions.Normal dentition.  Neck: normal, supple, no masses, no thyromegaly Respiratory: clear to auscultation bilaterally, no wheezing, no crackles. Normal respiratory effort. No accessory muscle use.  Cardiovascular: Regular rate and rhythm, no murmurs / rubs / gallops. No extremity edema. 2+ pedal pulses. No carotid bruits.  Abdomen: no tenderness, no masses palpated. No hepatosplenomegaly. Bowel sounds positive.   Musculoskeletal: Good range of motion, no joint swelling or tenderness, Skin: no rashes, lesions, ulcers.  No induration Neurologic: CN 2-12 grossly intact. Sensation intact, DTR normal. Strength 5/5 in all 4.  Psychiatric: Normal judgment and insight. Alert and oriented x 3. Normal mood  Data Reviewed:  Initial blood pressure 67/38, heart rate of 90 CBG 131.  Creatinine is 1.8 previously 1.1 a month ago with a BUN of 31 white count 7.2 and hemoglobin 11.9 urinalysis essentially negative head CT without contrast shows no acute findings.  Chest x-ray also showed no acute findings  Assessment and Plan:  #1 symptomatic hypotension: Most likely combination of severe dehydration and blood pressure medications.  Patient unable to get the losartan  dose [place] because of the size of the pill.  But more importantly he has been staying out in heat outside in the sun for prolonged period of times.  So far blood pressure has improved with initial fluid bolus.  Will increase maintenance fluid to 150 cc an hour and monitor.  MAP has risen to 70s.  Hold all antihypertensives for now.  #2 AKI: Secondary to dehydration most likely.  Also elements of the hypotension.  Continue to monitor  #3 morbid obesity: Dietary counseling.  #4 essential hypertension: Holding blood pressure medication in the setting of hypotension.  Continue to monitor  #5 coronary artery disease: Stable at baseline.  #6 GERD: Continue with PPIs  #7 parkinsonism: Continue medications for Parkinson's disease  #8 hypothyroidism: Continue levothyroxine .  #9 hyperlipidemia: Continue home regimen    Advance Care Planning:   Code Status: Full Code   Consults: None  Family Communication: Wife at bedside  Severity of Illness: The appropriate patient status for this patient is INPATIENT. Inpatient status is judged to be reasonable and necessary in order to provide the required intensity of service to ensure the patient's safety. The  patient's presenting symptoms, physical exam findings, and initial radiographic and laboratory data in the context of their chronic comorbidities is felt to place them at high risk for further clinical deterioration. Furthermore, it is not anticipated that the patient will be medically stable for discharge from the hospital within 2 midnights of admission.   * I certify that at the point of admission it is my clinical judgment that the patient will require inpatient hospital care spanning beyond 2 midnights from the point of admission due to high intensity of service, high risk for further deterioration and high frequency of surveillance required.*  AuthorBETHA SIM KNOLL, MD 08/15/2023 11:38 PM  For on call review [email or web address].

## 2023-08-16 DIAGNOSIS — I959 Hypotension, unspecified: Secondary | ICD-10-CM | POA: Diagnosis not present

## 2023-08-16 DIAGNOSIS — E1165 Type 2 diabetes mellitus with hyperglycemia: Secondary | ICD-10-CM | POA: Diagnosis not present

## 2023-08-16 LAB — COMPREHENSIVE METABOLIC PANEL WITH GFR
ALT: 24 U/L (ref 0–44)
AST: 27 U/L (ref 15–41)
Albumin: 3.4 g/dL — ABNORMAL LOW (ref 3.5–5.0)
Alkaline Phosphatase: 74 U/L (ref 38–126)
Anion gap: 12 (ref 5–15)
BUN: 27 mg/dL — ABNORMAL HIGH (ref 8–23)
CO2: 24 mmol/L (ref 22–32)
Calcium: 9.2 mg/dL (ref 8.9–10.3)
Chloride: 101 mmol/L (ref 98–111)
Creatinine, Ser: 1.59 mg/dL — ABNORMAL HIGH (ref 0.61–1.24)
GFR, Estimated: 44 mL/min — ABNORMAL LOW (ref >60)
Glucose, Bld: 139 mg/dL — ABNORMAL HIGH (ref 70–99)
Potassium: 3.5 mmol/L (ref 3.5–5.1)
Sodium: 137 mmol/L (ref 135–145)
Total Bilirubin: 0.7 mg/dL (ref 0.0–1.2)
Total Protein: 6.5 g/dL (ref 6.5–8.1)

## 2023-08-16 LAB — CBC
HCT: 38.6 % — ABNORMAL LOW (ref 39.0–52.0)
Hemoglobin: 12.2 g/dL — ABNORMAL LOW (ref 13.0–17.0)
MCH: 28.7 pg (ref 26.0–34.0)
MCHC: 31.6 g/dL (ref 30.0–36.0)
MCV: 90.8 fL (ref 80.0–100.0)
Platelets: 204 K/uL (ref 150–400)
RBC: 4.25 MIL/uL (ref 4.22–5.81)
RDW: 18.1 % — ABNORMAL HIGH (ref 11.5–15.5)
WBC: 7.5 K/uL (ref 4.0–10.5)
nRBC: 0 % (ref 0.0–0.2)

## 2023-08-16 LAB — GLUCOSE, CAPILLARY
Glucose-Capillary: 131 mg/dL — ABNORMAL HIGH (ref 70–99)
Glucose-Capillary: 82 mg/dL (ref 70–99)
Glucose-Capillary: 116 mg/dL — ABNORMAL HIGH (ref 70–99)

## 2023-08-16 LAB — TSH: TSH: 1.989 u[IU]/mL (ref 0.350–4.500)

## 2023-08-16 LAB — CBG MONITORING, ED
Glucose-Capillary: 95 mg/dL (ref 70–99)
Glucose-Capillary: 99 mg/dL (ref 70–99)

## 2023-08-16 MED ORDER — ONDANSETRON HCL 4 MG/2ML IJ SOLN: 4.0000 mg | Freq: Four times a day (QID) | INTRAMUSCULAR | Status: DC | PRN

## 2023-08-16 MED ORDER — BUSPIRONE HCL 5 MG PO TABS
5.0000 mg | ORAL_TABLET | Freq: Two times a day (BID) | ORAL | Status: DC
  Administered 2023-08-16 – 2023-08-17 (×4): 5 mg via ORAL
  Filled 2023-08-16 (×4): qty 1

## 2023-08-16 MED ORDER — SODIUM CHLORIDE 0.9 % IV SOLN: INTRAVENOUS | Status: DC

## 2023-08-16 MED ORDER — ONDANSETRON HCL 4 MG PO TABS: 4.0000 mg | ORAL_TABLET | Freq: Four times a day (QID) | ORAL | Status: DC | PRN

## 2023-08-16 MED ORDER — VITAMIN D 25 MCG (1000 UNIT) PO TABS
1000.0000 [IU] | ORAL_TABLET | Freq: Every day | ORAL | Status: DC
  Administered 2023-08-16 – 2023-08-17 (×2): 1000 [IU] via ORAL
  Filled 2023-08-16 (×2): qty 1

## 2023-08-16 MED ORDER — ACETAMINOPHEN 325 MG PO TABS
650.0000 mg | ORAL_TABLET | Freq: Four times a day (QID) | ORAL | Status: DC | PRN
  Administered 2023-08-16: 650 mg via ORAL
  Filled 2023-08-16: qty 2

## 2023-08-16 MED ORDER — PANTOPRAZOLE SODIUM 40 MG PO TBEC
40.0000 mg | DELAYED_RELEASE_TABLET | Freq: Every day | ORAL | Status: DC
  Administered 2023-08-16 – 2023-08-17 (×2): 40 mg via ORAL
  Filled 2023-08-16 (×2): qty 1

## 2023-08-16 MED ORDER — TAMSULOSIN HCL 0.4 MG PO CAPS
0.4000 mg | ORAL_CAPSULE | Freq: Every day | ORAL | Status: DC
  Administered 2023-08-16 (×2): 0.4 mg via ORAL
  Filled 2023-08-16 (×2): qty 1

## 2023-08-16 MED ORDER — DIVALPROEX SODIUM ER 250 MG PO TB24
250.0000 mg | ORAL_TABLET | Freq: Every day | ORAL | Status: DC
  Administered 2023-08-16 (×2): 250 mg via ORAL
  Filled 2023-08-16 (×3): qty 1

## 2023-08-16 MED ORDER — INSULIN ASPART 100 UNIT/ML IJ SOLN: 0.0000 [IU] | Freq: Every day | INTRAMUSCULAR | Status: DC

## 2023-08-16 MED ORDER — ALLOPURINOL 300 MG PO TABS
300.0000 mg | ORAL_TABLET | Freq: Every morning | ORAL | Status: DC
  Filled 2023-08-16 (×2): qty 1

## 2023-08-16 MED ORDER — CARBIDOPA-LEVODOPA 25-100 MG PO TABS
1.0000 | ORAL_TABLET | Freq: Three times a day (TID) | ORAL | Status: DC
  Administered 2023-08-16 – 2023-08-17 (×4): 1 via ORAL
  Filled 2023-08-16 (×4): qty 1

## 2023-08-16 MED ORDER — DULOXETINE HCL 30 MG PO CPEP
30.0000 mg | ORAL_CAPSULE | Freq: Every evening | ORAL | Status: DC
  Administered 2023-08-16: 30 mg via ORAL
  Filled 2023-08-16: qty 1

## 2023-08-16 MED ORDER — ROSUVASTATIN CALCIUM 20 MG PO TABS
20.0000 mg | ORAL_TABLET | Freq: Every day | ORAL | Status: DC
  Administered 2023-08-16 (×2): 20 mg via ORAL
  Filled 2023-08-16 (×2): qty 1

## 2023-08-16 MED ORDER — GABAPENTIN 300 MG PO CAPS
300.0000 mg | ORAL_CAPSULE | Freq: Three times a day (TID) | ORAL | Status: DC
  Administered 2023-08-16 – 2023-08-17 (×3): 300 mg via ORAL
  Filled 2023-08-16 (×3): qty 1

## 2023-08-16 MED ORDER — INSULIN ASPART 100 UNIT/ML IJ SOLN
0.0000 [IU] | Freq: Three times a day (TID) | INTRAMUSCULAR | Status: DC
  Administered 2023-08-16: 2 [IU] via SUBCUTANEOUS

## 2023-08-16 MED ORDER — TRAZODONE HCL 50 MG PO TABS
50.0000 mg | ORAL_TABLET | Freq: Every day | ORAL | Status: DC
  Administered 2023-08-16 (×2): 50 mg via ORAL
  Filled 2023-08-16 (×2): qty 1

## 2023-08-16 MED ORDER — LEVOTHYROXINE SODIUM 25 MCG PO TABS
25.0000 ug | ORAL_TABLET | Freq: Every day | ORAL | Status: DC
  Administered 2023-08-16 – 2023-08-17 (×2): 25 ug via ORAL
  Filled 2023-08-16 (×2): qty 1

## 2023-08-16 MED ORDER — ENOXAPARIN SODIUM 40 MG/0.4ML IJ SOSY
40.0000 mg | PREFILLED_SYRINGE | INTRAMUSCULAR | Status: DC
  Administered 2023-08-16 – 2023-08-17 (×2): 40 mg via SUBCUTANEOUS
  Filled 2023-08-16 (×2): qty 0.4

## 2023-08-16 MED ORDER — ASPIRIN 81 MG PO TBEC
81.0000 mg | DELAYED_RELEASE_TABLET | Freq: Every morning | ORAL | Status: DC
Start: 2023-08-17 — End: 2023-08-17
  Administered 2023-08-17: 81 mg via ORAL
  Filled 2023-08-16: qty 1

## 2023-08-16 NOTE — ED Notes (Signed)
 IP unit called.

## 2023-08-16 NOTE — Evaluation (Signed)
 Physical Therapy Evaluation Patient Details Name: Anthony [name] MRN: [id number] DOB: 10-03-1945 Today's Date: 08/16/2023  History of Present Illness  Pt is a 78 y.o. male admitted 7/25 for AMS, hypotension, AKI. PMH: DM, HLD, hypothyroidism, Parkinson's, diabetic neuropathy, CAD  Clinical Impression  Pt denied any dizziness and orthostatics were negative (see flow sheet). Pt moving fairly well after being in bed x hours and isn't far from baseline. Will follow here to maximize independence and mobility and recommend pt resume his OPPT at dc.        If plan is discharge home, recommend the following: Assist for transportation;Help with stairs or ramp for entrance;Assistance with cooking/housework   Can travel by private vehicle        Equipment Recommendations None recommended by PT  Recommendations for Other Services       Functional Status Assessment Patient has had a recent decline in their functional status and demonstrates the ability to make significant improvements in function in a reasonable and predictable amount of time.     Precautions / Restrictions Precautions Precautions: Fall Restrictions Weight Bearing Restrictions Per Provider Order: No      Mobility  Bed Mobility Overal bed mobility: Modified Independent             General bed mobility comments: Incr time and effort    Transfers Overall transfer level: Needs assistance Equipment used: Rollator (4 wheels) Transfers: Sit to/from Stand Sit to Stand: Supervision                Ambulation/Gait Ambulation/Gait assistance: Supervision Gait Distance (Feet): 325 Feet Assistive device: Rollator (4 wheels) Gait Pattern/deviations: Step-through pattern, Decreased stride length Gait velocity: decr Gait velocity interpretation: 1.31 - 2.62 ft/sec, indicative of limited community ambulator   General Gait Details: Assist for safety. No loss of balance including manuvering in tight  spaces  Stairs            Wheelchair Mobility     Tilt Bed    Modified Rankin (Stroke Patients Only)       Balance Overall balance assessment: Needs assistance Sitting-balance support: No upper extremity supported, Feet supported Sitting balance-Leahy Scale: Good     Standing balance support: No upper extremity supported, During functional activity Standing balance-Leahy Scale: Fair                               Pertinent Vitals/Pain Pain Assessment Pain Assessment: No/denies pain    Home Living Family/patient expects to be discharged to:: Private residence Living Arrangements: Spouse/significant other Available Help at Discharge: Family;Available 24 hours/day (wife uses walker as well) Type of Home: House Home Access: Stairs to enter   [employer] of Steps: 2   Home Layout: One level Home Equipment: Rollator (4 wheels)      Prior Function Prior Level of Function : Independent/Modified Independent;History of Falls (last six months)             Mobility Comments: Modified independent with recent use of rollator       Extremity/Trunk Assessment   Upper Extremity Assessment Upper Extremity Assessment: Defer to OT evaluation    Lower Extremity Assessment Lower Extremity Assessment: Generalized weakness       Communication   Communication Communication: No apparent difficulties    Cognition Arousal: Alert Behavior During Therapy: WFL for tasks assessed/performed   PT - Cognitive impairments: No apparent impairments  Following commands: Intact       Cueing Cueing Techniques: Verbal cues     General Comments General comments (skin integrity, edema, etc.): VSS. See flow sheet for orthostatics.    Exercises     Assessment/Plan    PT Assessment Patient needs continued PT services  PT Problem List Decreased strength;Decreased balance;Decreased mobility       PT Treatment  Interventions DME instruction;Gait training;Stair training;Functional mobility training;Therapeutic activities;Therapeutic exercise;Balance training;Patient/family education    PT Goals (Current goals can be found in the Care Plan section)  Acute Rehab PT Goals Patient Stated Goal: go home PT Goal Formulation: With patient Time For Goal Achievement: 08/23/23 Potential to Achieve Goals: Good    Frequency Min 2X/week     Co-evaluation               AM-PAC PT 6 Clicks Mobility  Outcome Measure Help needed turning from your back to your side while in a flat bed without using bedrails?: None Help needed moving from lying on your back to sitting on the side of a flat bed without using bedrails?: None Help needed moving to and from a bed to a chair (including a wheelchair)?: A Little Help needed standing up from a chair using your arms (e.g., wheelchair or bedside chair)?: A Little Help needed to walk in hospital room?: A Little Help needed climbing 3-5 steps with a railing? : A Little 6 Click Score: 20    End of Session Equipment Utilized During Treatment: Gait belt Activity Tolerance: Patient tolerated treatment well Patient left: in chair;with call bell/phone within reach;with chair alarm set;with family/visitor present Nurse Communication: Mobility status PT Visit Diagnosis: Other abnormalities of gait and mobility (R26.89);Muscle weakness (generalized) (M62.81);History of falling (Z91.81)    Time: 8395-8359 PT Time Calculation (min) (ACUTE ONLY): 36 min   Charges:   PT Evaluation $PT Eval Moderate Complexity: 1 Mod PT Treatments $Gait Training: 8-22 mins PT General Charges $$ ACUTE PT VISIT: 1 Visit         [facility] PT Acute Rehabilitation Services Office [phone]   Rodgers ORN [facility] [place] 08/16/2023, 5:15 PM

## 2023-08-16 NOTE — ED Notes (Signed)
 CCMD notified of patient movement.

## 2023-08-16 NOTE — Progress Notes (Signed)
 PROGRESS NOTE    Anthony [name]  FMW:[id number] DOB: Jun 24, 1945 DOA: 08/15/2023 PCP: Vernadine Charlie ORN, MD  Chief Complaint  Patient presents with   Hypotension    Brief Narrative:   Anthony [name] is Anthony [name] 78 y.o. male with medical history significant of essential hypertension, type 2 diabetes, GERD, morbid obesity, hyperlipidemia, hypothyroidism, Parkinson's disease, diabetic neuropathy, obstructive sleep apnea on CPAP, anxiety disorder, coronary artery disease and BPH who presents to the ER with altered mental status.   He's been admitted with AMS in setting of hypotension.  AMS improved, still with AKI today.  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  In the setting of his home lasix , jardiance , losartan , flomax , imdur .  Dehydration likely playing Ender Rorke role based on history.  He was hypotensive on 6/17 cards visit with SBP in 90s (isosorbide  reduced to 15 mg).    Per H&P, he was supposed to take half losartan , but apparently having issues with this.  Also apparently recently filled lisinopril  (supposed to be discontinued).  Will see if his wife can bring in his medicines so we can make sure he's not taking ace inhibitor.  Otherwise, seems like he'll need reduction in his BP meds.  Currently BP meds on hold, will resume flomax  to avoid issues with urinary retention Follow orthostatics PT/OT  AKI  Baseline Cr. 1.1 1.8 at presentation, gradually improving  UA without protein/blood If not continuing to improve, needs renal  US   HFpEF Currently is euvolemic As above, with hypotension on presentation, planning to hold some of his HF meds.  Hypertension As above  CAD PTCA distal LAD 08/2021 Aspirin , statin  Mild Aortic Stenosis Noted  Parkinson's Disease Sinemet   Hypothyroidism Synthroid   Mood disorder Depakote  - not clear indication - will need to follow up - I don't see hx seizure, presume mood related?  Dyslipidemia Statin  GERD  PPI    DVT prophylaxis: lovenox  Code Status: full Family Communication: wife at bedside Disposition:   Status is: Inpatient Remains inpatient appropriate because: need for continued inpatient care   Consultants:  none  Procedures:  none  Antimicrobials:  Anti-infectives (From admission, onward)    Start     Dose/Rate Route Frequency Ordered Stop   08/15/23 1915  piperacillin -tazobactam (ZOSYN ) IVPB 3.375 g        3.375 g 100 mL/hr over 30 Minutes Intravenous  Once 08/15/23 1913 08/15/23 2046   08/15/23 1915  vancomycin  (VANCOREADY) IVPB 1500 mg/300 mL        1,500 mg 150 mL/hr over 120 Minutes Intravenous  Once 08/15/23 1913 08/15/23 2255       Subjective: No new complaints  Objective: Vitals:   08/16/23 0630 08/16/23 0641 08/16/23 0855 08/16/23 1135  BP: 107/72  122/72 102/63  Pulse: 70  79 71  Resp: 15  12 17   Temp:  98 F (36.7 C) 97.7 F (36.5 C) 97.6 F (36.4 C)  TempSrc:  Oral  Oral  SpO2: 99%  96% 93%  Weight:      Height:  Intake/Output Summary (Last 24 hours) at 08/16/2023 1257 Last data filed at 08/16/2023 0453 Gross per 24 hour  Intake 2695.43 ml  Output 500 ml  Net 2195.43 ml   Filed Weights   08/15/23 1848  Weight: 88.5 kg    Examination:  General exam: Appears calm and comfortable  Respiratory system: Clear to auscultation. Respiratory effort normal. Cardiovascular system: S1 & S2 heard, RRR.  Gastrointestinal system: Abdomen is nondistended, soft and nontender.  Central nervous system: Alert and  oriented. No focal neurological deficits. Extremities: no LEE   Data Reviewed: I have personally reviewed following labs and imaging studies  CBC: Recent Labs  Lab 08/15/23 1905 08/15/23 2007 08/16/23 0319  WBC 7.2  --  7.5  NEUTROABS 4.7  --   --   HGB 11.9* 12.6* 12.2*  HCT 35.7* 37.0* 38.6*  MCV 86.9  --  90.8  PLT 193  --  204    Basic Metabolic Panel: Recent Labs  Lab 08/15/23 1905 08/15/23 2007 08/16/23 0319  NA 137 137 137  K 3.5 3.7 3.5  CL 103 100 101  CO2 24  --  24  GLUCOSE 98 118* 139*  BUN 31* 31* 27*  CREATININE 1.80* 1.80* 1.59*  CALCIUM  8.9  --  9.2    GFR: Estimated Creatinine Clearance: 43.6 mL/min (Shalin Vonbargen) (by C-G formula based on SCr of 1.59 mg/dL (H)).  Liver Function Tests: Recent Labs  Lab 08/15/23 1905 08/16/23 0319  AST 30 27  ALT 16 24  ALKPHOS 79 74  BILITOT 0.7 0.7  PROT 6.5 6.5  ALBUMIN 3.4* 3.4*    CBG: Recent Labs  Lab 08/16/23 0038 08/16/23 0801 08/16/23 1137  GLUCAP 99 95 131*     No results found for this or any previous visit (from the past 240 hours).       Radiology Studies: CT Head Wo Contrast Result Date: 08/15/2023 CLINICAL DATA:  AMS, mild dysarthria EXAM: CT HEAD WITHOUT CONTRAST TECHNIQUE: Contiguous axial images were obtained from the base of the skull through the vertex without intravenous contrast. RADIATION DOSE REDUCTION: This exam was performed according to the departmental dose-optimization program which includes automated exposure control, adjustment of the mA and/or kV according to patient size and/or use of iterative reconstruction technique. COMPARISON:  CTA head/neck 08/21/2022. FINDINGS: Brain: No evidence of acute infarction, hemorrhage, hydrocephalus, extra-axial collection or mass lesion/mass effect. Patchy white matter hypodensities, compatible with chronic microvascular ischemic disease. Vascular: No hyperdense vessel or unexpected calcification. Skull: No acute fracture. Sinuses/Orbits:  Mostly clear sinuses.  No acute orbital findings. Other: No mastoid effusions. IMPRESSION: No evidence of acute intracranial abnormality. Electronically Signed   By: Gilmore GORMAN Molt M.D.   On: 08/15/2023 20:17   DG Chest Portable 1 View Result Date: 08/15/2023 CLINICAL DATA:  Hypotension and hypoxia EXAM: PORTABLE CHEST 1 VIEW COMPARISON:  Chest x-ray 06/21/2023 FINDINGS: The heart size and mediastinal contours are within normal limits. Both lungs are clear. Cervical spinal fusion plate present. No acute fractures are seen. IMPRESSION: No active disease. Electronically Signed   By: Greig Pique M.D.   On: 08/15/2023 19:24        Scheduled Meds:  allopurinol   300 mg Oral q AM   busPIRone   5 mg Oral BID   carbidopa -levodopa   1 tablet Oral TID   divalproex   250 mg Oral QHS   DULoxetine   30 mg Oral QPM   enoxaparin  (LOVENOX ) injection  40 mg Subcutaneous Q24H   insulin  aspart  0-15 Units  Subcutaneous TID WC   insulin  aspart  0-5 Units Subcutaneous QHS   levothyroxine   25 mcg Oral QAC breakfast   pantoprazole   40 mg Oral Daily   rosuvastatin   20 mg Oral QHS   tamsulosin   0.4 mg Oral QHS   traZODone   50 mg Oral QHS   Continuous Infusions:  sodium chloride  150 mL/hr at 08/16/23 9071   lactated ringers  Stopped (08/16/23 0050)     LOS: 1 day    Time spent: over 30 min    Meliton Monte, MD [facility]   To contact the attending provider between 7A-7P or the covering provider during after hours 7P-7A, please log into the web site [email or web address] and access using universal [place] password for that web site. If you do not have the password, please call the hospital operator.  08/16/2023, 12:57 PM

## 2023-08-16 NOTE — TOC Initial Note (Signed)
 Transition of Care [facility]) - Initial/Assessment Note    Patient Details  Name: Anthony [name] MRN: [id number] Date of Birth: Nov 04, 1945  [facility] CM/SW Contact:    Marval Gell, RN Phone Number: 08/16/2023, 2:27 PM  Clinical Narrative:                  Patient admitted from home w dehydration after being out for prolonged period of time.  From home w wife. Is active w OP therapies at [facility], has cane and CPAP through the [place]. ICM will continue to follow through progression rounds.  Expected Discharge Plan: Home/Self Care Barriers to Discharge: Continued Medical Work up   Patient Goals and CMS Choice            Expected Discharge Plan and Services   Discharge Planning Services: CM Consult   Living arrangements for the past 2 months: Single Family Home                                      Prior Living Arrangements/Services Living arrangements for the past 2 months: Single Family Home Lives with:: Spouse              Current home services: DME    Activities of Daily Living   ADL Screening (condition at time of admission) Independently performs ADLs?: Yes (appropriate for developmental age) Is the patient deaf or have difficulty hearing?: No Does the patient have difficulty seeing, even when wearing glasses/contacts?: No Does the patient have difficulty concentrating, remembering, or making decisions?: No  Permission Sought/Granted                  Emotional Assessment              Admission diagnosis:  Hypotension [I95.9] Patient Active Problem List   Diagnosis Date Noted   Hypotension 08/15/2023   (HFpEF) heart failure with preserved ejection fraction (HCC) 06/24/2023   Mild aortic stenosis 06/24/2023   Hypothyroidism 06/24/2023   Heart failure (HCC) 06/20/2023   Acute right-sided congestive heart failure (HCC) 06/20/2023   Shortness of breath 06/20/2023   Nonrheumatic aortic valve stenosis 02/07/2023    Hypersomnia, persistent 12/26/2022   Nasal congestion 12/26/2022   Post traumatic stress disorder (PTSD) 12/26/2022   S/P cervical spinal fusion 08/02/2022   Parkinsonism (HCC) 12/06/2021   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/06/2021   Hyperkalemia 12/06/2020   CAD (coronary artery disease) 12/03/2019   Esophageal dysphagia 11/21/2019   Gastroesophageal reflux disease 11/21/2019   Family history of colon cancer 11/21/2019   Abnormal stress test 05/11/2019   Mixed hyperlipidemia 05/11/2019   Angina pectoris (HCC) 08/07/2018   Essential hypertension 08/07/2018   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, with long-term current use of insulin  (HCC) 08/07/2018   Morbid obesity (HCC) 08/28/2017   Neuropathy 08/28/2017   Numbness and tingling of both legs below knees 11/10/2013   OSA on CPAP 05/14/2013   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   Obesity (BMI 30.0-34.9) 05/14/2013   PCP:  Vernadine Charlie ORN, MD Pharmacy:   [id number] [address] [place], [place] - [address] AT [facility] [address] [place] [address] Phone: [phone] Fax: [phone]  [facility] - [place], [place] - [address] [place] [address] Phone: [phone] Fax: [phone]  [facility] [address] [place] [address] Phone: [phone] Fax: [phone]     Social Drivers of Health (SDOH) Social History: SDOH Screenings   Food Insecurity: No Food Insecurity (08/16/2023)  Housing: Unknown (08/16/2023)  Transportation Needs: No Transportation Needs (08/16/2023)  Utilities: Not At Risk (08/16/2023)  Depression (PHQ2-9): Low Risk  (09/08/2019)  Social Connections: Socially Integrated (08/16/2023)  Tobacco Use: Low Risk  (08/15/2023)   SDOH Interventions:     Readmission Risk Interventions      No data to display

## 2023-08-17 DIAGNOSIS — I959 Hypotension, unspecified: Secondary | ICD-10-CM | POA: Diagnosis not present

## 2023-08-17 LAB — CBC WITH DIFFERENTIAL/PLATELET
Abs Immature Granulocytes: 0.01 K/uL (ref 0.00–0.07)
Basophils Absolute: 0 K/uL (ref 0.0–0.1)
Basophils Relative: 1 %
Eosinophils Absolute: 0.2 K/uL (ref 0.0–0.5)
Eosinophils Relative: 4 %
HCT: 33.3 % — ABNORMAL LOW (ref 39.0–52.0)
Hemoglobin: 11.3 g/dL — ABNORMAL LOW (ref 13.0–17.0)
Immature Granulocytes: 0 %
Lymphocytes Relative: 39 %
Lymphs Abs: 2.1 K/uL (ref 0.7–4.0)
MCH: 29.4 pg (ref 26.0–34.0)
MCHC: 33.9 g/dL (ref 30.0–36.0)
MCV: 86.5 fL (ref 80.0–100.0)
Monocytes Absolute: 0.4 K/uL (ref 0.1–1.0)
Monocytes Relative: 7 %
Neutro Abs: 2.7 K/uL (ref 1.7–7.7)
Neutrophils Relative %: 49 %
Platelets: 189 K/uL (ref 150–400)
RBC: 3.85 MIL/uL — ABNORMAL LOW (ref 4.22–5.81)
RDW: 18 % — ABNORMAL HIGH (ref 11.5–15.5)
WBC: 5.4 K/uL (ref 4.0–10.5)
nRBC: 0 % (ref 0.0–0.2)

## 2023-08-17 LAB — GLUCOSE, CAPILLARY
Glucose-Capillary: 92 mg/dL (ref 70–99)
Glucose-Capillary: 97 mg/dL (ref 70–99)

## 2023-08-17 LAB — COMPREHENSIVE METABOLIC PANEL WITH GFR
ALT: 18 U/L (ref 0–44)
AST: 23 U/L (ref 15–41)
Albumin: 3 g/dL — ABNORMAL LOW (ref 3.5–5.0)
Alkaline Phosphatase: 71 U/L (ref 38–126)
Anion gap: 10 (ref 5–15)
BUN: 16 mg/dL (ref 8–23)
CO2: 26 mmol/L (ref 22–32)
Calcium: 9.4 mg/dL (ref 8.9–10.3)
Chloride: 102 mmol/L (ref 98–111)
Creatinine, Ser: 0.93 mg/dL (ref 0.61–1.24)
GFR, Estimated: >60 mL/min (ref >60)
Glucose, Bld: 102 mg/dL — ABNORMAL HIGH (ref 70–99)
Potassium: 3.4 mmol/L — ABNORMAL LOW (ref 3.5–5.1)
Sodium: 138 mmol/L (ref 135–145)
Total Bilirubin: 0.3 mg/dL (ref 0.0–1.2)
Total Protein: 6 g/dL — ABNORMAL LOW (ref 6.5–8.1)

## 2023-08-17 LAB — PHOSPHORUS: Phosphorus: 2.8 mg/dL (ref 2.5–4.6)

## 2023-08-17 LAB — MAGNESIUM: Magnesium: 1.8 mg/dL (ref 1.7–2.4)

## 2023-08-17 MED ORDER — POTASSIUM CHLORIDE CRYS ER 20 MEQ PO TBCR
40.0000 meq | EXTENDED_RELEASE_TABLET | Freq: Once | ORAL | Status: AC
  Administered 2023-08-17: 40 meq via ORAL
  Filled 2023-08-17: qty 2

## 2023-08-17 NOTE — Plan of Care (Signed)
  Problem: Education: Goal: Ability to describe self-care measures that may prevent or decrease complications (Diabetes Survival Skills Education) will improve Outcome: Progressing   Problem: Coping: Goal: Ability to adjust to condition or change in health will improve Outcome: Progressing   Problem: Health Behavior/Discharge Planning: Goal: Ability to manage health-related needs will improve Outcome: Progressing   Problem: Metabolic: Goal: Ability to maintain appropriate glucose levels will improve Outcome: Progressing   Problem: Education: Goal: Knowledge of General Education information will improve Description: Including pain rating scale, medication(s)/side effects and non-pharmacologic comfort measures Outcome: Progressing   Problem: Clinical Measurements: Goal: Will remain free from infection Outcome: Progressing Goal: Diagnostic test results will improve Outcome: Progressing Goal: Respiratory complications will improve Outcome: Progressing   Problem: Activity: Goal: Risk for activity intolerance will decrease Outcome: Progressing   Problem: Nutrition: Goal: Adequate nutrition will be maintained Outcome: Progressing

## 2023-08-17 NOTE — Discharge Summary (Signed)
 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 male with medical history significant of essential hypertension, type 2 diabetes, GERD, morbid obesity, hyperlipidemia, hypothyroidism, Parkinson's disease, diabetic neuropathy, obstructive sleep apnea on CPAP, anxiety disorder, coronary artery disease and BPH who presents to the ER with altered mental status.    He's been admitted with AMS in setting of hypotension.    Resolved at time of discharge, holding his losartan /jardiance /lasix  at the time of discharge.  He'll need outpatient follow up.    Hospital Course:  Assessment and Plan:  Hypotension  Altered Mental Status SBP 70's on presentation with lethargy related to his hypotension.  In the setting of his home lasix , jardiance , losartan , flomax , imdur .  Dehydration likely playing Davaris Youtsey role based on history.  He was hypotensive on 6/17 cards visit with SBP in 90s (isosorbide  reduced to 15 mg).     Will stop his losartan , jardiance , and lasix .  Will continue  imdur  for now as well as tamsulosin .  He should follow with his outpatient provider for instructions on what to resume in the future Follow orthostatics - negative PT/OT  - outpatient PT   AKI  Baseline Cr. 1.1 1.8 at presentation, gradually improving  UA without protein/blood Now at baseline    HFpEF Currently is euvolemic Holding lasix , jardiance , losartan  at discharge - follow outpatient    Hypertension As above - stopping losartan , jardiance , lasix  - continue imdur  for now   CAD PTCA distal LAD 08/2021 Aspirin , statin   Mild Aortic Stenosis Noted - follow outpatient    Parkinson's Disease Sinemet    Hypothyroidism Synthroid    Mood disorder PTSD  Bipolar Depakote     Dyslipidemia Statin  GERD  PPI    Procedures: none   Consultations: none  Discharge Exam: Vitals:   08/17/23 0516 08/17/23 0805  BP: 123/77 132/84  Pulse: 79 85  Resp: 15 (!) 30  Temp:  97.8 F (36.6 C)  SpO2:  95%   No concerns, eager to dc  General: No acute distress. Cardiovascular: RRR Lungs: unlabored Abdomen: Soft, nontender, nondistended Neurological: Alert and oriented 3. Moves all extremities 4 with equal strength. Cranial nerves II through XII grossly intact. Extremities: No clubbing or cyanosis. No edema  Discharge Instructions   Discharge Instructions     (HEART FAILURE PATIENTS) Call MD:  Anytime you have any of the following symptoms: 1) 3 pound weight gain in 24 hours or 5 pounds in 1 week 2) shortness of breath, with or without Thuan Tippett dry hacking cough 3) swelling in the hands, feet or stomach 4) if you have to sleep on  extra pillows at night in order to breathe.   Complete by: As directed    Ambulatory referral to Physical Therapy   Complete by: As directed    Call MD for:  difficulty breathing, headache or visual disturbances   Complete by: As directed    Call MD for:  extreme fatigue   Complete by: As directed    Call MD for:  hives   Complete by: As  directed    Call MD for:  persistant dizziness or light-headedness   Complete by: As directed    Call MD for:  persistant nausea and vomiting   Complete by: As directed    Call MD for:  redness, tenderness, or signs of infection (pain, swelling, redness, odor or green/yellow discharge around incision site)   Complete by: As directed    Call MD for:  severe uncontrolled pain   Complete by: As directed    Call MD for:  temperature >100.4   Complete by: As directed    Diet - low sodium heart healthy   Complete by: As directed    Discharge instructions   Complete by: As directed    You were seen after an episode of hypotension.  I think this was related to your blood pressure medicines.  There was also likely some dehydration with you passing out outside.   Stop your jardiance .  Stop your losartan .  Stop your jardiance .  Stop your lasix .  Follow up with your outpatient provider (with cardiology or your PCP) regarding when or if you should resume any of these medicines.  Continue your imdur  for now, if you develop lightheadedness or dizziness, stop this.   Return for new, recurrent, or worsening symptoms.  Please ask your PCP to request records from this hospitalization so they know what was done and what the next steps will be.   Increase activity slowly   Complete by: As directed       Allergies as of 08/17/2023       Reactions   Lyrica [pregabalin] Other (See Comments)   Delirium, Dizziness, Drowsy   Metformin  Diarrhea        Medication List     PAUSE taking these medications    furosemide  40 MG tablet Wait to take this until your doctor or other care provider tells you to start again. Follow up with your PCP and cardiologist outpatient before you restart this. Commonly known as: LASIX  Take 40 mg by mouth daily.       STOP taking these medications    empagliflozin  25 MG Tabs tablet Commonly known as: JARDIANCE    losartan  25 MG tablet Commonly known as:  COZAAR        TAKE these medications    acetaminophen  500 MG tablet Commonly known as: TYLENOL  Take 500-1,000 mg by mouth every 6 (six) hours as needed for moderate pain (pain score 4-6).   allopurinol  300 MG tablet Commonly known as: ZYLOPRIM  Take 300 mg by mouth in the morning.   aspirin  EC 81 MG tablet Take 81 mg by mouth in the morning. Swallow whole.   busPIRone  10 MG tablet Commonly known as: BUSPAR  Take 5 mg by mouth 2 (two) times daily.   carbidopa -levodopa  25-100 MG tablet Commonly known as: SINEMET  IR Take 1 tablet by mouth 3 (three) times daily.   carboxymethylcellulose 0.5 % Soln Commonly known as: REFRESH PLUS Place 1 drop into both eyes in the morning and at bedtime.   Cholecalciferol  25 MCG (1000 UT) tablet Take  1,000 Units by mouth daily.   colchicine 0.6 MG tablet Take 0.6 mg by mouth daily as needed (Gout Flare up).   divalproex  250 MG 24 hr tablet Commonly known as: DEPAKOTE  ER Take 250 mg by mouth at bedtime.   DULoxetine  30 MG capsule Commonly known as: CYMBALTA  Take 30 mg by mouth at bedtime.   famotidine  40 MG tablet Commonly known as: PEPCID  Take 40 mg by mouth daily.   gabapentin  600 MG tablet Commonly known as: NEURONTIN  Take 600 mg by mouth 3 (three) times daily.   insulin  glargine 100 UNIT/ML Solostar Pen Commonly known as: LANTUS  Inject 25 Units into the skin at bedtime. Inject 25 units subcutaneously at bedtime.   isosorbide  mononitrate 30 MG 24 hr tablet Commonly known as: IMDUR  Take 0.5 tablets (15 mg total) by mouth daily.   levothyroxine  25 MCG tablet Commonly known as: SYNTHROID  Take 25 mcg by mouth daily before breakfast.   methocarbamol  500 MG tablet Commonly known as: ROBAXIN  Take 1 tablet (500 mg total) by mouth every 6 (six) hours as needed for muscle spasms.   mirabegron  ER 50 MG Tb24 tablet Commonly known as: MYRBETRIQ  Take 50 mg by mouth at bedtime.   mupirocin ointment 2 % Commonly known as:  BACTROBAN Apply 1 Application topically 2 (two) times daily as needed (skin irritation).   nitroGLYCERIN  0.4 MG SL tablet Commonly known as: NITROSTAT  Place 0.4 mg under the tongue every 5 (five) minutes as needed for chest pain.   pantoprazole  40 MG tablet Commonly known as: PROTONIX  Take protonix  40 mg twice daily for 2 months.  Resume once per day after that.   pyridOXINE  100 MG tablet Commonly known as: VITAMIN B6 Take 100 mg by mouth in the morning.   rosuvastatin  20 MG tablet Commonly known as: CRESTOR  TAKE 1 TABLET BY MOUTH EVERY DAY What changed: when to take this   sitaGLIPtin 100 MG tablet Commonly known as: JANUVIA Take 100 mg by mouth daily.   Sodium Fluoride 1.1 % Pste Place 1 Application onto teeth 2 (two) times daily.   solifenacin 5 MG tablet Commonly known as: VESICARE Take 5 mg by mouth daily.   Soothe Nighttime Oint Apply 1 Application to eye at bedtime.   tamsulosin  0.4 MG Caps capsule Commonly known as: FLOMAX  Take 0.4 mg by mouth at bedtime.   THERAWORX RELIEF EX Apply 1 Application topically daily as needed (leg cramps).   traZODone  50 MG tablet Commonly known as: DESYREL  Take 25 mg by mouth at bedtime.       Allergies  Allergen Reactions   Lyrica [Pregabalin] Other (See Comments)    Delirium, Dizziness, Drowsy   Metformin  Diarrhea      The results of significant diagnostics from this hospitalization (including imaging, microbiology, ancillary and laboratory) are listed below for reference.    Significant Diagnostic Studies: CT Head Wo Contrast Result Date: 08/15/2023 CLINICAL DATA:  AMS, mild dysarthria EXAM: CT HEAD WITHOUT CONTRAST TECHNIQUE: Contiguous axial images were obtained from the base of the skull through the vertex without intravenous contrast. RADIATION DOSE REDUCTION: This exam was performed according to the departmental dose-optimization program which includes automated exposure control, adjustment of the mA and/or kV  according to patient size and/or use of iterative reconstruction technique. COMPARISON:  CTA head/neck 08/21/2022. FINDINGS: Brain: No evidence of acute infarction, hemorrhage, hydrocephalus, extra-axial collection or mass lesion/mass effect. Patchy white matter hypodensities, compatible with chronic microvascular ischemic disease. Vascular: No hyperdense vessel or unexpected calcification. Skull: No acute  fracture. Sinuses/Orbits: Mostly clear sinuses.  No acute orbital findings. Other: No mastoid effusions. IMPRESSION: No evidence of acute intracranial abnormality. Electronically Signed   By: Gilmore GORMAN Molt M.D.   On: 08/15/2023 20:17   DG Chest Portable 1 View Result Date: 08/15/2023 CLINICAL DATA:  Hypotension and hypoxia EXAM: PORTABLE CHEST 1 VIEW COMPARISON:  Chest x-ray 06/21/2023 FINDINGS: The heart size and mediastinal contours are within normal limits. Both lungs are clear. Cervical spinal fusion plate present. No acute fractures are seen. IMPRESSION: No active disease. Electronically Signed   By: Greig Pique M.D.   On: 08/15/2023 19:24   LONG TERM MONITOR (3-14 DAYS) Result Date: 08/10/2023 Zio patch monitor 13 days 07/17/2023 - 07/31/2023: Dominant rhythm: Sinus. HR 52-112 bpm. Avg HR 73 bpm, in sinus rhythm. 12 episodes of atrial tachycardia, fastest at 182 bpm for 12 beats, longest for 16 beats at 139 bpm. <1% isolated SVE, couplet/triplets. 0 episodes of VT. <1% isolated VE, no couplet/triplets. No atrial fibrillation/atrial flutter/VT/high grade AV block, sinus pause >3sec noted. 16 patient triggered events, some correlated with SVE.   Cpap titration Result Date: 08/06/2023 Dohmeier, Dedra, MD     08/10/2023  6:54 PM  [facility] PAP TITRATION INTERPRETATION REPORT STUDY DATE: 08/06/2023   PATIENT NAME:  Anthony [name]        DATE OF BIRTH:  [birth year] PATIENT ID:  [id number]    TYPE OF STUDY:  CPAP SlLEEP PHYSICIAN: DEDRA GORES, MD Referred by Duwaine Russell, NP  SCORING TECHNICIAN: Delon Sprung, RPSGT HISTORY:  last seen by Duwaine Russell, NP for Erisha Paugh revisit , reportedly struggling with his CPAP and persistent Hypersomnia. CPAP data were reviewed. Christalyn Goertz new attended study was ordered including new mask fitting. KELVON GIANNINI is Weylin Plagge 78 y.o. male patient with Parkinsons disease who is here for revisit 12/26/2022 for persistent hypersomnia while on CPAP. The patient has PD, haring loss and diabetes, gets many of his medications from the [place].  he has struggled with high insulin  costs and relies on Jardiance  as well. His CPAP is working OK for him,  highly compliant he had some higher residual AHIs but remains under 10/ h. But his air leakage is very, very high on Herberto Ledwell ResMed P 10 pillow. That one has flimsy headgear. He reportedly sleeps 8.5 hours at night and additional naps throughout the day when he watches Tv, for example. Reports large air leak. PD may cause some sleepiness too and central apneas. Acting out dreams was seen last year, identified as PTSD per [place] records, and not REM BD - which is most likely to occur in [place] Mao PD patient.  Screaming, kicking, fighting. Has once fallen out of bed. The medication has reduced this significantly. . The Epworth Sleepiness Scale was 19 out of 24 (scores above or equal to 10 are suggestive of hypersomnolence). DESCRIPTION: Asta Corbridge sleep technologist was in attendance for the duration of the recording.  Data collection, scoring, video monitoring, and reporting were performed in compliance with the AASM Manual for the Scoring of Sleep and Associated Events; (Hypopnea is scored based on the criteria listed in Section VIII D. 1b in the AASM Manual V2.6 using Antwione Picotte 4% oxygen desaturation rule or Hypopnea is scored based on the criteria listed in Section VIII D. 1a in the AASM Manual V2.6 using 3% oxygen desaturation and /or arousal rule).  Davionne Dowty physician certified by the American Board of Sleep Medicine reviewed each epoch of the study. ADDITIONAL  INFORMATION:  Height: 70.0 in Weight: 233 lb (BMI 33) Neck Size: 21.0 in  MEDICATIONS: Tylenol , Ventolin  HFA, Zyloprim , Aloglipton Benzoate, Norvasc , Aspirin , Buspar , Capsaicin, Sinemet  IR, Refresh Plus eye drops, colchicine, Depakote  ER, Cymbalta , Jardiance , Nexium, Pepcid , Flonase , Lasix , Neurontin , Norco, Semglee , Imdur , Nizoral , Zestril , Robaxin , Toprol  XL, Metrogel, Myrbetriq , Nitrostat , Ditropan  XL, Protonix , Miralax, Vitamin B6, Crestor , Vesicare, Synthroid , Flomax , Desyrel  SLEEP CONTINUITY AND SLEEP ARCHITECTURE: CPAP was initiated under Camila Maita new mask, Stefanie Hodgens FFM in large by  ResMed , the F 20. Pressure was applied  at 5 cm water with 3 cm EPR and  increased to Charleton Deyoung final pressure of 15 cm water CPAP. BiPAP was  attempted after the 15 cm water pressure of CPAP did not work out. BiPAP pressure of 16/ 12 cm water reduced the AHI to 5.45/h, there was no hypoxia and 34% REM sleep was seen.   Lights off was at 22:07: and lights on 04:45: (6.6 hours in bed). Total sleep time was 382.0 minutes (100.0% supine;  0.0% lateral;  0.0% prone, 7.6% REM sleep), with Freda Jaquith high sleep efficiency at 96.0%. Sleep latency was brief at 7.5 minutes.  Of the total sleep time, the percentage of stage N1 sleep was 0.1%, stage N2 sleep was 92.3%, stage N3 sleep was 0.0%, and REM sleep was 7.6%. There were 2 Stage R periods observed on this study night, 0 awakenings (i.e. transitions to Stage W from any sleep stage), and 7.0 total stage transitions. Wake after sleep onset (WASO) time accounted for 08 minutes. RESPIRATORY MONITORING:  Based on CMS criteria (using Almarosa Bohac 4% oxygen desaturation rule for scoring hypopneas), there were 28 apneas (28 obstructive; 0 central; 0 mixed), and 71 hypopneas.  The Apnea index was 4.4/h. The  Hypopnea index was 11.2/h. AHI :  The apnea-hypopnea index was 15.5/h overall (15.5 supine, 0.0 non-supine; 14.5/h in  REM,). There were 0 respiratory effort-related arousals (RERAs).  OXIMETRY: Total sleep time spent at, or below  88% was 18.7 minutes, or 4.9% of total sleep time. Respiratory events were associated with oxyhemoglobin desaturations (nadir during sleep 78%) from Emilliano Dilworth mean of 93%). EKG:  The average heart rate during sleep was 62 bpm.  The maximum heart rate during sleep was 73 bpm. The maximum heart rate during recording was 73. Unusual EKG pattern with major EEG artefact. BODY POSITION: Duration of total sleep and percent of total sleep in their respective position is as follows: supine 382 minutes (100.0%), Total supine REM sleep time was 29 minutes (100.0% of total REM sleep). LIMB MOVEMENTS: There were 95 periodic limb movements of sleep (14.9/h), of which 6 (0.9/h) were associated with an arousal. AROUSAL: There were 34 arousals in total, for an arousal index of 5.0 arousals/hour.  Of these, 14 were identified as respiratory-related arousals (2.2 /h), 6 were PLM-related arousals (0.9 /h), and 14 were non-specific arousals (2.2 /h) IMPRESSION: All supine sleep titration. 1. CPAP at 11 cm water pressure did work well for the apnea control but was seen over 47 minutes with 100% sleep efficiency. higher pressures and lower CPAP pressure did not work out. BiPAP at 16/ 12 cm water reduced the apnea to 5.45 /h , not better than  CPAP did. 2. Total sleep time was within normal limits.  The quality of sleep improved with positive airway pressure.  3. Mild periodic limb movements (PLMS) were observed and these did not intrude into REM sleep.   RECOMMENDATIONS: I like DME to set the patient's current auto- CPAP to 11 cm water with  3 cm EPR, using the new mask F20 in large ( Berwyn Bigley FFM by ResMed AirFit). If the CPAP downloads improve in the upcoming visits, then no further changes will be needed. If not, I recommend to switch to Jaidon Ellery BiPAP of 13/ 9 cm water with the new mask as well. Rv with NP or me within the next 3 months.  DEDRA GORES, MD  [facility] CPAP/Bilevel Report  General Information Name:  Anthony [name], Anthony [name] BMI: 66 Physician: ,  ID: [id number] Height: 70 in Technician: Jackson Nest Sex: Male Weight: 233 lb Record: [id number] Age: 48 [[birth year]] Date: 08/06/2023 Scorer: Nest Jackson Pressure IPAP/EPAP 00 04 05 07 09 11 12 16  / 12  O2 Vol 0.0 0.0 0.0 0.0 0.0 0.0 0.0 0.0 Time TRT 0.64m 34.57m 18.53m 56.46m 28.65m 47.6m 51.86m 30.1m  TST 0.48m 26.13m 18.64m 56.54m 28.34m 47.1m 51.46m 22.83m Sleep Stage % Wake 0.0 22.1 0.0 0.0 0.0 0.0 0.0 27.9  % REM 0.0 0.0 0.0 0.0 0.0 0.0 27.5 34.1  % N1 0.0 0.0 0.0 0.0 0.0 0.0 0.0 2.3  % N2 0.0 100.0 100.0 100.0 100.0 100.0 72.5 63.6  % N3 0.0 0.0 0.0 0.0 0.0 0.0 0.0 0.0 Respiratory Total Events 0 19 22 23 8  0 7 2  Obs. Apn. 0 4 6 12 1  0 0 0  Mixed Apn. 0 0 0 0 0 0 0 0  Cen. Apn. 0 0 0 0 0 0 0 0  Hypopneas 0 15 16 11 7  0 7 2  AHI 0.00 43.02 73.33 24.42 16.84 0.00 8.24 5.45  Supine AHI 0.00 43.02 73.33 24.42 16.84 0.00 8.24 5.45  Prone AHI 0.00 0.00 0.00 0.00 0.00 0.00 0.00 0.00  Side AHI 0.00 0.00 0.00 0.00 0.00 0.00 0.00 0.00 Respiratory (4%) Hypopneas (4%) 0.00 15.00 16.00 10.00 7.00 0.00 7.00 2.00  AHI (4%) 0.00 43.02 73.33 23.36 16.84 0.00 8.24 5.45  Supine AHI (4%) 0.00 43.02 73.33 23.36 16.84 0.00 8.24 5.45  Prone AHI (4%) 0.00 0.00 0.00 0.00 0.00 0.00 0.00 0.00  Side AHI (4%) 0.00 0.00 0.00 0.00 0.00 0.00 0.00 0.00 Desat Profile <= 90% 0.32m 9.37m 7.67m 37.59m 14.37m 0.68m 1.67m 0.61m  <= 80% 0.48m 0.62m 0.84m 0.76m 0.6m 0.66m 0.21m 0.89m  <= 70% 0.1m 0.69m 0.31m 0.57m 0.92m 0.83m 0.52m 0.53m  <= 60% 0.46m 0.77m 0.2m 0.57m 0.65m 0.33m 0.28m 0.15m Arousal Index Apnea 0.0 0.0 0.0 1.1 2.1 0.0 0.0 0.0  Hypopnea 0.0 0.0 3.3 2.1 4.2 0.0 1.2 0.0  LM 0.0 0.0 0.0 0.0 0.0 0.0 0.0 2.7  Spontaneous 0.0 0.0 0.0 1.1 6.3 0.0 1.2 5.5 Pressure IPAP/EPAP 13 15  O2 Vol 0.0 0.0 Time TRT 90.58m 42.29m  TST 90.47m 42.52m Sleep Stage % Wake 0.0 0.0  % REM 8.3 0.0  % N1 0.0 0.0  % N2 91.7 100.0  % N3 0.0 0.0 Respiratory Total Events 8 11  Obs. Apn. 3 2  Mixed Apn. 0 0  Cen. Apn. 0 0  Hypopneas 5 9  AHI 5.33 15.53  Supine  AHI 5.33 15.53  Prone AHI 0.00 0.00  Side AHI 0.00 0.00 Respiratory (4%) Hypopneas (4%) 5.00 9.00  AHI (4%) 5.33 15.53  Supine AHI (4%) 5.33 15.53  Prone AHI (4%) 0.00 0.00  Side AHI (4%) 0.00 0.00 Desat Profile <= 90% 2.34m 1.57m  <= 80% 0.66m 0.65m  <= 70% 0.8m 0.12m  <= 60% 0.60m 0.32m Arousal Index Apnea 0.7 0.0  Hypopnea 1.3 4.2  LM 0.0  7.1  Spontaneous 4.0 2.8 [facility] CPAP Summary  General Information Name: Anthony [name], Anthony [name] BMI: 33.43 Physician: DEDRA GORES, MD ID: [id number] Height: 70.0 in Technician: Delon Sprung, RPSGT Sex: Male Weight: 233.0 lb Record: [id number] Age: 45 [[birth year]] Date: 08/06/2023    Medical & Medication History   HCC, Anxiety, Arthritis, BPH, CAD, Depression, Diabetes Mellitus type 2, Fatty liver, GERD, Gout, Heart disease, HTN, Parkinson, OSA, Hyperlipidemia, Hypothyroidism, Neuropathy, Heart murmur Tylenol , Ventolin  HFA, Zyloprim , Aloglipton Benzoate, Norvasc , Aspirin , Buspar , Capsaicin, Simemet IR, Refresh Plus eye drops, colchicine, Depakote  ER, Cymbalta , Jardiance , Nexium, Pepcid , Flonase , Lasix , Neurontin , Norco, Semglee , Imdur , Nizoral , Zestril , Robaxin , Toprol  XL, Metrogel, Merbetriq, Nitrostat , Ditropan  XL, Protonix , Miralax, Vitamin B6, Crestor , Vesicare, Synthroid , Flomax , Desyrel   Sleep Disorder  OSA on CPAP  Comments  Patient is Anthony [name] 78 y/o male who was referred to the sleep lab for Jamaiyah Pyle CPAP/BIPAP titration. Patient currently wearing ResMed AirFit F40, patient does not like mask , states it leaks. Tech suggested F20 large FFM, patient liked the fit and style. Patient slept supine entire study. Patient had noted hypopneas and OSA. PLMS noted, snoring as moderate. EKG appeared to be AFIB. Patient started out on 4cm h2o with EPR 3 ended CPAP at 15cm h2o and then switched to BIPAP 16/12cm h2o. Patient tolerated therapy well. Patient did have an RBD episode toward end of night. Patient aroused to use the bathroom. Patient had no complaints  and tech answered all questions.  CPAP start time: 10:07:38 PM CPAP end time: 04:45:27 AM Time Total Supine Side Prone Upright Recording (TRT) 6h 38.33m 6h 38.24m 0h 0.96m 0h 0.100m 0h 0.53m Sleep (TST) 6h 22.85m 6h 22.17m 0h 0.18m 0h 0.91m 0h 0.23m Latency N1 N2 N3 REM Onset Per. Slp. Eff. Actual 6h 23.57m 0h 7.22m 0h 0.69m 3h 33.23m 0h 7.75m 0h 7.21m 95.98% Stg Dur Wake N1 N2 N3 REM Total 7.5 0.5 352.5 0.0 29.0 Supine 7.5 0.5 352.5 0.0 29.0 Side 0.0 0.0 0.0 0.0 0.0 Prone 0.0 0.0 0.0 0.0 0.0 Upright 0.0 0.0 0.0 0.0 0.0  Stg % Wake N1 N2 N3 REM Total 1.9 0.1 92.3 0.0 7.6 Supine 1.9 0.1 92.3 0.0 7.6 Side 0.0 0.0 0.0 0.0 0.0 Prone 0.0 0.0 0.0 0.0 0.0 Upright 0.0 0.0 0.0 0.0 0.0  Apnea Summary Sub Supine Side Prone Upright Total 28 Total 28 28 0 0 0   REM 0 0 0 0 0   NREM 28 28 0 0 0 Obs 28 REM 0 0 0 0 0   NREM 28 28 0 0 0 Mix 0 REM 0 0 0 0 0   NREM 0 0 0 0 0 Cen 0 REM 0 0 0 0 0   NREM 0 0 0 0 0 Rera Summary Sub Supine Side Prone Upright Total 0 Total 0 0 0 0 0   REM 0 0 0 0 0   NREM 0 0 0 0 0  Hypopnea Summary Sub Supine Side Prone Upright Total 72 Total 72 72 0 0 0   REM 7 7 0 0 0   NREM 65 65 0 0 0 4% Hypopnea Summary Sub Supine Side Prone Upright Total (4%) 71 Total 71 71 0 0 0   REM 7 7 0 0 0   NREM 64 64 0 0 0  AHI Total Obs Mix Cen 15.71 Apnea 4.40 4.40 0.00 0.00  Hypopnea 11.31 -- -- -- 15.55 Hypopnea (4%) 11.15 -- -- --  Total Supine Side Prone Upright Position AHI  15.71 15.71 0.00 0.00 0.00 REM AHI 14.48  NREM AHI 15.81  Position RDI 15.71 15.71 0.00 0.00 0.00 REM RDI 14.48  NREM RDI 15.81  4% Hypopnea Total Supine Side Prone Upright Position AHI (4%) 15.55 15.55 0.00 0.00 0.00 REM AHI (4%) 14.48  NREM AHI (4%) 15.64  Position RDI (4%) 15.55 15.55 0.00 0.00 0.00 REM RDI (4%) 14.48  NREM RDI (4%) 15.64  Desaturation Information  <100% <90% <80% <70% <60% <50% <40% Supine 89 61 1 0 0 0 0 Side 0 0 0 0 0 0 0 Prone 0 0 0 0 0 0 0 Upright 0 0 0 0 0 0 0 Total 89 61 1 0 0 0 0 Desaturation threshold setting: 4% Minimum desaturation  setting: 10 seconds SaO2 nadir: 78% The longest event was Marna Weniger 64 sec obstructive Hypopnea with Khiree Bukhari minimum SaO2 of 86%. The lowest SaO2 was 78% associated with Rebeckah Masih 22 sec obstructive Apnea. EKG Rates EKG Avg Max Min Awake 0 0 0 Asleep 62 73 54 EKG Events: N/Mykayla Brinton Awakening/Arousal Information # of Awakenings 0 Wake after sleep onset 8.47m Wake after persistent sleep 8.29m [employer] Arousals Index Apneas 3 0.5 Hypopneas 11 1.7 Leg Movements 6 0.9 Snore 0.0 0.0 PTT Arousals 0 0.0 Spontaneous 14 2.2 Total 32 5.0 Myoclonus Information PLMS LMs Index Total LMs during PLMS 95 14.9 LMs w/ Microarousals 6 0.9 LM LMs Index w/ Microarousal 0 0.0 w/ Awakening 0 0.0 w/ Resp Event 0 0.0 Spontaneous 6 0.9 Total 6 0.9     Microbiology: Recent Results (from the past 240 hours)  Blood culture (routine x 2)     Status: None (Preliminary result)   Collection Time: 08/15/23  7:29 PM   Specimen: BLOOD RIGHT ARM  Result Value Ref Range Status   Specimen Description BLOOD RIGHT ARM  Final   Special Requests   Final    BOTTLES DRAWN AEROBIC AND ANAEROBIC Blood Culture adequate volume   Culture   Final    NO GROWTH 2 DAYS Performed at [facility], [address], [place], [place] [address]    Report Status PENDING  Incomplete  Blood culture (routine x 2)     Status: None (Preliminary result)   Collection Time: 08/15/23  8:00 PM   Specimen: BLOOD  Result Value Ref Range Status   Specimen Description BLOOD SITE NOT SPECIFIED  Final   Special Requests   Final    BOTTLES DRAWN AEROBIC AND ANAEROBIC Blood Culture results may not be optimal due to an inadequate volume of blood received in culture bottles   Culture   Final    NO GROWTH 2 DAYS Performed at [facility], [address] [place], [place] [address]    Report Status PENDING  Incomplete     Labs: Basic Metabolic Panel: Recent Labs  Lab 08/15/23 1905 08/15/23 2007 08/16/23 0319 08/17/23 0608  NA 137 137 137 138  K 3.5 3.7 3.5 3.4*  CL 103  100 101 102  CO2 24  --  24 26  GLUCOSE 98 118* 139* 102*  BUN 31* 31* 27* 16  CREATININE 1.80* 1.80* 1.59* 0.93  CALCIUM  8.9  --  9.2 9.4  MG  --   --   --  1.8  PHOS  --   --   --  2.8   Liver Function Tests: Recent Labs  Lab 08/15/23 1905 08/16/23 0319 08/17/23 0608  AST 30 27 23   ALT 16 24 18   ALKPHOS  79 74 71  BILITOT 0.7 0.7 0.3  PROT 6.5 6.5 6.0*  ALBUMIN 3.4* 3.4* 3.0*   No results for input(s): LIPASE, AMYLASE in the last 168 hours. No results for input(s): AMMONIA in the last 168 hours. CBC: Recent Labs  Lab 08/15/23 1905 08/15/23 2007 08/16/23 0319 08/17/23 0608  WBC 7.2  --  7.5 5.4  NEUTROABS 4.7  --   --  2.7  HGB 11.9* 12.6* 12.2* 11.3*  HCT 35.7* 37.0* 38.6* 33.3*  MCV 86.9  --  90.8 86.5  PLT 193  --  204 189   Cardiac Enzymes: No results for input(s): CKTOTAL, CKMB, CKMBINDEX, TROPONINI in the last 168 hours. BNP: BNP (last 3 results) Recent Labs    06/20/23 1736  BNP 291.2*    ProBNP (last 3 results) No results for input(s): PROBNP in the last 8760 hours.  CBG: Recent Labs  Lab 08/16/23 0801 08/16/23 1137 08/16/23 1551 08/16/23 2144 08/17/23 0808  GLUCAP 95 131* 82 116* 92       Signed:  Meliton Monte MD.  [facility] 08/17/2023, 10:41 AM

## 2023-08-17 NOTE — Evaluation (Signed)
 Occupational Therapy Evaluation Patient Details Name: Anthony [name] MRN: [id number] DOB: [birth year] Today's Date: 08/17/2023   History of Present Illness   Pt is a 78 y.o. male admitted 7/25 for AMS, hypotension, AKI. PMH: DM, HLD, hypothyroidism, Parkinson's, diabetic neuropathy, CAD     Clinical Impressions Pt admitted for above, PTA pt was ind with ADLs and mobility. He currently presents no far from functional baseline, ambulating with supervision + RW and completing ADLs with setup to Mod I level. Educated pt on benefits of OT for Parkinsons dx, issued plate guard to reduce challenges with feeding. OT to follow pt acutely to progress and educate as able. Recommended outpatient Neuro OT as well      If plan is discharge home, recommend the following:   Assistance with cooking/housework;Assist for transportation     Functional Status Assessment   Patient has not had a recent decline in their functional status     Equipment Recommendations   Other (comment) (issued plate guard)     Recommendations for Other Services         Precautions/Restrictions   Precautions Precautions: Fall Restrictions Weight Bearing Restrictions Per Provider Order: No     Mobility Bed Mobility               General bed mobility comments: Pt in recliner on arrival    Transfers Overall transfer level: Needs assistance Equipment used: Rolling walker (2 wheels) Transfers: Sit to/from Stand Sit to Stand: Supervision                  Balance Overall balance assessment: Needs assistance Sitting-balance support: No upper extremity supported, Feet supported Sitting balance-Leahy Scale: Good Sitting balance - Comments: in recliner   Standing balance support: No upper extremity supported, During functional activity Standing balance-Leahy Scale: Fair                             ADL either performed or assessed with clinical judgement   ADL Overall  ADL's : Needs assistance/impaired Eating/Feeding: Independent;Sitting   Grooming: Oral care;Standing;Supervision/safety   Upper Body Bathing: Sitting;Set up   Lower Body Bathing: Set up;Sitting/lateral leans   Upper Body Dressing : Independent;Sitting   Lower Body Dressing: Sit to/from stand;Supervision/safety   Toilet Transfer: Supervision/safety;Ambulation;Rolling walker (2 wheels)   Toileting- Clothing Manipulation and Hygiene: Sit to/from stand;Supervision/safety       Functional mobility during ADLs: Supervision/safety;Rolling walker (2 wheels) General ADL Comments: Pt picked up object from floor without LOB. Pt expressed to OT that feeding has presenting an increase in challenges since onset of Parkinson's but pt does not go to outpatient Neuro OT. Educated pt on the benefits of OT for Parkinsons and how we can stimulate the vestibular system as well to create smoother movements.     Vision Baseline Vision/History: 1 Wears glasses Ability to See in Adequate Light: 1 Impaired Patient Visual Report: No change from baseline Vision Assessment?: No apparent visual deficits     Perception         Praxis         Pertinent Vitals/Pain Pain Assessment Pain Assessment: No/denies pain     Extremity/Trunk Assessment Upper Extremity Assessment Upper Extremity Assessment: Overall WFL for tasks assessed   Lower Extremity Assessment Lower Extremity Assessment: Generalized weakness       Communication Communication Communication: No apparent difficulties   Cognition Arousal: Alert Behavior During Therapy: WFL for tasks assessed/performed Cognition: No apparent impairments  Following commands: Intact       Cueing  General Comments   Cueing Techniques: Verbal cues  VSS   Exercises     Shoulder Instructions      Home Living Family/patient expects to be discharged to:: Private residence Living Arrangements:  Spouse/significant other Available Help at Discharge: Family;Available 24 hours/day (wife uses walker as well) Type of Home: House Home Access: Stairs to enter [employer] of Steps: 2   Home Layout: One level         Firefighter: Standard     Home Equipment: Rollator (4 wheels)   Additional Comments: Goes to outpatient Neuro OT for Parkinsons      Prior Functioning/Environment Prior Level of Function : Independent/Modified Independent;History of Falls (last six months)             Mobility Comments: Modified independent with recent use of rollator ADLs Comments: ind    OT Problem List: Impaired balance (sitting and/or standing)   OT Treatment/Interventions: Therapeutic exercise;Patient/family education;Therapeutic activities;Balance training;DME and/or AE instruction      OT Goals(Current goals can be found in the care plan section)   Acute Rehab OT Goals Patient Stated Goal: To return home OT Goal Formulation: With patient Time For Goal Achievement: 08/31/23 Potential to Achieve Goals: Good ADL Goals Pt Will Perform Eating: with modified independence;with adaptive utensils;sitting Pt Will Perform Lower Body Dressing: with modified independence;sit to/from stand Pt Will Transfer to Toilet: with modified independence;ambulating   OT Frequency:  Min 2X/week    Co-evaluation              AM-PAC OT 6 Clicks Daily Activity     Outcome Measure Help from another person eating meals?: None Help from another person taking care of personal grooming?: A Little Help from another person toileting, which includes using toliet, bedpan, or urinal?: A Little Help from another person bathing (including washing, rinsing, drying)?: A Little Help from another person to put on and taking off regular upper body clothing?: None Help from another person to put on and taking off regular lower body clothing?: A Little 6 Click Score: 20   End of Session  Equipment Utilized During Treatment: Rolling walker (2 wheels) Nurse Communication: Mobility status  Activity Tolerance: Patient tolerated treatment well Patient left: in chair;with call bell/phone within reach;with chair alarm set  OT Visit Diagnosis: History of falling (Z91.81);Muscle weakness (generalized) (M62.81)                Time: 1210-1224 OT Time Calculation (min): 14 min Charges:  OT General Charges $OT Visit: 1 Visit OT Evaluation $OT Eval Low Complexity: 1 Low  08/17/2023  AB, OTR/L  Acute Rehabilitation Services  Office: [phone]   Curtistine JONETTA Das 08/17/2023, 1:11 PM

## 2023-08-17 NOTE — TOC Transition Note (Signed)
 Transition of Care [facility]) - Discharge Note   Patient Details  Name: Anthony [name] MRN: [id number] Date of Birth: Jun 14, 1945  [facility] CM/SW Contact:  Marval Gell, RN Phone Number: 08/17/2023, 10:41 AM   Clinical Narrative:     Plan to continue OP therapies established prior to admission, no action required.    Final next level of care: Home/Self Care Barriers to Discharge: No Barriers Identified   Patient Goals and CMS Choice            Discharge Placement                       Discharge Plan and Services Additional resources added to the After Visit Summary for     Discharge Planning Services: CM Consult                                 Social Drivers of Health (SDOH) Interventions SDOH Screenings   Food Insecurity: No Food Insecurity (08/16/2023)  Housing: Unknown (08/16/2023)  Transportation Needs: No Transportation Needs (08/16/2023)  Utilities: Not At Risk (08/16/2023)  Depression (PHQ2-9): Low Risk  (09/08/2019)  Social Connections: Socially Integrated (08/16/2023)  Tobacco Use: Low Risk  (08/15/2023)     Readmission Risk Interventions     No data to display

## 2023-08-18 ENCOUNTER — Other Ambulatory Visit (HOSPITAL_COMMUNITY): Payer: Self-pay

## 2023-08-20 LAB — CULTURE, BLOOD (ROUTINE X 2)
Culture: NO GROWTH
Culture: NO GROWTH
Special Requests: ADEQUATE

## 2023-08-21 ENCOUNTER — Ambulatory Visit: Admitting: Cardiology

## 2023-08-22 DIAGNOSIS — H5462 Unqualified visual loss, left eye, normal vision right eye: Secondary | ICD-10-CM | POA: Diagnosis not present

## 2023-08-22 DIAGNOSIS — Z794 Long term (current) use of insulin: Secondary | ICD-10-CM | POA: Diagnosis not present

## 2023-08-22 DIAGNOSIS — E1140 Type 2 diabetes mellitus with diabetic neuropathy, unspecified: Secondary | ICD-10-CM | POA: Diagnosis not present

## 2023-08-22 DIAGNOSIS — I110 Hypertensive heart disease with heart failure: Secondary | ICD-10-CM | POA: Diagnosis not present

## 2023-08-22 DIAGNOSIS — F4310 Post-traumatic stress disorder, unspecified: Secondary | ICD-10-CM | POA: Diagnosis not present

## 2023-08-22 DIAGNOSIS — G4733 Obstructive sleep apnea (adult) (pediatric): Secondary | ICD-10-CM | POA: Diagnosis not present

## 2023-08-22 DIAGNOSIS — I2510 Atherosclerotic heart disease of native coronary artery without angina pectoris: Secondary | ICD-10-CM | POA: Diagnosis not present

## 2023-08-22 DIAGNOSIS — I700 Atherosclerosis of aorta: Secondary | ICD-10-CM | POA: Diagnosis not present

## 2023-08-22 DIAGNOSIS — G20C Parkinsonism, unspecified: Secondary | ICD-10-CM | POA: Diagnosis not present

## 2023-08-22 DIAGNOSIS — E669 Obesity, unspecified: Secondary | ICD-10-CM | POA: Diagnosis not present

## 2023-08-22 DIAGNOSIS — E039 Hypothyroidism, unspecified: Secondary | ICD-10-CM | POA: Diagnosis not present

## 2023-08-22 DIAGNOSIS — I5030 Unspecified diastolic (congestive) heart failure: Secondary | ICD-10-CM | POA: Diagnosis not present

## 2023-08-25 ENCOUNTER — Encounter: Payer: Self-pay | Admitting: Cardiology

## 2023-08-25 ENCOUNTER — Ambulatory Visit: Attending: Cardiology | Admitting: Cardiology

## 2023-08-25 ENCOUNTER — Ambulatory Visit: Attending: Adult Health

## 2023-08-25 VITALS — BP 85/52 | HR 75 | Ht 70.5 in | Wt 211.8 lb

## 2023-08-25 DIAGNOSIS — I25118 Atherosclerotic heart disease of native coronary artery with other forms of angina pectoris: Secondary | ICD-10-CM | POA: Diagnosis not present

## 2023-08-25 DIAGNOSIS — R2681 Unsteadiness on feet: Secondary | ICD-10-CM | POA: Diagnosis not present

## 2023-08-25 DIAGNOSIS — R2689 Other abnormalities of gait and mobility: Secondary | ICD-10-CM | POA: Insufficient documentation

## 2023-08-25 DIAGNOSIS — M6281 Muscle weakness (generalized): Secondary | ICD-10-CM | POA: Diagnosis not present

## 2023-08-25 DIAGNOSIS — I5032 Chronic diastolic (congestive) heart failure: Secondary | ICD-10-CM

## 2023-08-25 DIAGNOSIS — R262 Difficulty in walking, not elsewhere classified: Secondary | ICD-10-CM | POA: Diagnosis not present

## 2023-08-25 DIAGNOSIS — I50812 Chronic right heart failure: Secondary | ICD-10-CM

## 2023-08-25 NOTE — Therapy (Signed)
 OUTPATIENT PHYSICAL THERAPY NEURO TREATMENT   Patient Name: Anthony [name] MRN: [id number] DOB:20-Oct-1945, 78 y.o., male Today's Date: 08/25/2023   PCP: Dohmeier, Dedra REFERRING PROVIDER: Natalia Waddell LABOR, PA-C     END OF SESSION:  PT End of Session - 08/25/23 1537     Visit Number 14    Number of Visits 19    Date for PT Re-Evaluation 09/22/23    Authorization Type [employer]    Progress Note Due on Visit 20    PT Start Time 1530    PT Stop Time 1615    PT Time Calculation (min) 45 min    Equipment Utilized During Treatment Gait belt   wearing compression stockings   Activity Tolerance Patient tolerated treatment well    Behavior During Therapy WFL for tasks assessed/performed                 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   Neuromuscular disorder (HCC) 2023   Parkinson's Disease   Neuropathy    left leg greater than right leg   Obesity (BMI 30.0-34.9) 05/14/2013   OSA on CPAP    cpap setting of 10/ 13   Pancreatitis dx march 2016   Pneumonia 12 years ago   Past Surgical History:  Procedure Laterality Date   ANTERIOR CERVICAL DECOMP/DISCECTOMY FUSION N/A 08/02/2022   Procedure: Anterior Cervical Decompression/Discectomy Fusion - Cervical Three-Cervical Four,  Cervical Four-Cervical Five,  remove Cervical Five-Cervical Six Plate;  Surgeon: Joshua Alm Hamilton, MD;  Location: [facility] OR;  Service: Neurosurgery;  Laterality: N/A;  3C   BACK SURGERY  10/2009   Cervical, arterior   CARPAL TUNNEL  RELEASE Left 2003   CARPAL TUNNEL RELEASE Bilateral    CATARACT EXTRACTION Bilateral 01/2012   COLONOSCOPY  2024   CORONARY BALLOON ANGIOPLASTY N/A 08/28/2021   Procedure: CORONARY BALLOON ANGIOPLASTY;  Surgeon: Elmira Newman PARAS, MD;  Location: [facility];  Service: Cardiovascular;  Laterality: N/A;   EUS N/A 07/15/2014   Procedure: FULL UPPER ENDOSCOPIC ULTRASOUND (EUS) RADIAL;  Surgeon: Belvie Just, MD;  Location: [facility];  Service: Endoscopy;  Laterality: N/A;   LEFT HEART CATH AND CORONARY ANGIOGRAPHY N/A 05/18/2019   Procedure: LEFT HEART CATH AND CORONARY ANGIOGRAPHY;  Surgeon: Ladona Heinz, MD;  Location: [facility];  Service: Cardiovascular;  Laterality: N/A;   LEFT HEART CATH AND CORONARY ANGIOGRAPHY N/A 08/28/2021   Procedure: LEFT HEART CATH AND CORONARY ANGIOGRAPHY;  Surgeon: Elmira Newman PARAS, MD;  Location: [facility];  Service: Cardiovascular;  Laterality: N/A;   NASAL SINUS SURGERY  1981   RETINAL Bilateral 06/2013   Retinal peel   SHOULDER SURGERY Right 2003   TONSILLECTOMY  1954   Patient Active Problem List   Diagnosis Date Noted   Hypotension 08/15/2023   (HFpEF) heart failure with preserved ejection fraction (HCC) 06/24/2023   Mild  aortic stenosis 06/24/2023   Hypothyroidism 06/24/2023   Heart failure (HCC) 06/20/2023   Acute right-sided congestive heart failure (HCC) 06/20/2023   Shortness of breath 06/20/2023   Nonrheumatic aortic valve stenosis 02/07/2023   Hypersomnia, persistent 12/26/2022   Nasal congestion 12/26/2022   Post traumatic stress disorder (PTSD) 12/26/2022   S/P cervical spinal fusion 08/02/2022   Parkinsonism (HCC) 12/06/2021   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/06/2021   Hyperkalemia 12/06/2020   CAD (coronary artery disease) 12/03/2019   Esophageal dysphagia 11/21/2019   Gastroesophageal reflux disease 11/21/2019   Family history of colon cancer 11/21/2019   Abnormal stress test 05/11/2019    Mixed hyperlipidemia 05/11/2019   Angina pectoris (HCC) 08/07/2018   Essential hypertension 08/07/2018   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, with long-term current use of insulin  (HCC) 08/07/2018   Morbid obesity (HCC) 08/28/2017   Neuropathy 08/28/2017   Numbness and tingling of both legs below knees 11/10/2013   OSA on CPAP 05/14/2013   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   Obesity (BMI 30.0-34.9) 05/14/2013    ONSET DATE: worse over past several weeks  REFERRING DIAG: R26.89 (ICD-10-CM) - Other abnormalities of gait and mobility  THERAPY DIAG:  Other abnormalities of gait and mobility  Unsteadiness on feet  Muscle weakness (generalized)  Difficulty in walking, not elsewhere classified  Rationale for Evaluation and Treatment: Rehabilitation  SUBJECTIVE:                                                                                                                                                                                             SUBJECTIVE STATEMENT: Had to go to hospital x 3 days due to low BP.  Have been evaluated by cardiology and they removed 6 medications and the lightheadedness has improved since  Pt accompanied by: significant other  PERTINENT HISTORY: hypertension, hyperlipidemia, CAD, type 2 DM, mild AS, OSA on CPAP, hypothyroidism, Parkinson's disease, GERD.  PAIN:  Are you having pain? No  PRECAUTIONS: Fall  RED FLAGS: None   WEIGHT BEARING RESTRICTIONS: No  FALLS: Has patient fallen in last 6 months? Yes. Number of falls 7  LIVING ENVIRONMENT: Lives with: lives with their family and lives with their spouse Lives in: House/apartment Stairs: Yes, exterior: 4 steps; Ground-floor set-up Has following equipment at home: None  PLOF: Independent with basic ADLs  PATIENT GOALS: improve balance  OBJECTIVE:   TODAY'S TREATMENT: 08/25/23 Activity Comments  104/68 mmHg, 68 bpm   LAQ 3x15 10#   Seated step over 20x 10#, 6 hurdle  STS w/ march 1x10 10#  Sidestep x 2  min 10#, CGA  Retrowalk x 2 min SBA-CGA  High step/stride length over hurdles x 2 min CGA  Treadmill training: progressive increase speed intervals 2 min at 2.0 mph 30 sec speed intervals: 2.5, 2.7, 2.8 mph 60 sec 2.0, 2.2, 2.3, 2.4 mph  155/83 mmHg, 99 bpm      Did end up getting the 4WW.    TODAY'S TREATMENT: 08/13/2023 Activity Comments  Vitals: 96/65 HR 73    6 MWT:  778 ft with rollator No rest breaks, does not report dizziness  Vitals:  109/64 HR 80 bpm   Heel/toe raises 2 x 10    Gentle mini-squats to up on toes;  Standing wide BOS UE lifts, trunk rotation with yellow weighted ball Some shakiness with BLEs with squats-better with wider BOS  Standing balance work in parallel bars:   Forward step over obstacle Forward/back step over obstacle Side step over obstacle Step taps to cones Forward/back walking x 2 min BUE>1 UE support         HOME EXERCISE PROGRAM: Access Code: [email or web address] URL: [email or web address] Date: 07/16/2023 Prepared by: [facility] - Outpatient  Rehab - [facility]  Exercises - Sitting Knee Extension with Resistance  - 1 x daily - 5 x weekly - 3 sets - 10 reps - Seated March with Resistance  - 1 x daily - 5 x weekly - 3 sets - 10 reps - Seated Hip Abduction with Resistance  - 1 x daily - 5 x weekly - 3 sets - 10 reps - Seated Hamstring Curls with Resistance  - 1 x daily - 5 x weekly - 3 sets - 10 reps - Mini Squat with Counter Support  - 1 x daily - 7 x weekly - 2-3 sets - 10 reps - Feet Together Balance at [employer] Eyes Closed  - 1 x daily - 7 x weekly - 3 sets - 10 sec hold - Corner Balance Feet Together: Eyes Closed With Head Turns  - 1 x daily - 7 x weekly - 3 sets - 30 sec hold   PATIENT EDUCATION: Education details: Continue current HEP Person educated: Patient Education method: Explanation Education comprehension: verbalized understanding     Note: Objective measures were completed at Evaluation unless otherwise noted.  DIAGNOSTIC FINDINGS:   Vitals: 107/67 mmHg, 91 bpm  COGNITION: Overall cognitive status: Within functional limits for tasks assessed   SENSATION: Not tested, reports hx of neuropathy in fingers  COORDINATION: Difficulty to rapid alternating movement Heel to shin WNL  EDEMA:  None at present, currently daily weights for CHF  MUSCLE TONE: no hypertonicity noted  MUSCLE LENGTH: WNL  DTRs:  NT  POSTURE: rounded shoulders and forward head  LOWER EXTREMITY ROM:     Active  Right Eval Left Eval  Hip flexion    Hip extension    Hip abduction    Hip adduction    Hip internal rotation    Hip external rotation    Knee flexion    Knee extension    Ankle dorsiflexion    Ankle plantarflexion    Ankle inversion    Ankle eversion     (Blank rows = not tested)  LOWER EXTREMITY MMT:    MMT Right Eval Left Eval  Hip flexion    Hip extension    Hip abduction    Hip adduction    Hip internal rotation    Hip external rotation    Knee flexion    Knee extension  Ankle dorsiflexion    Ankle plantarflexion    Ankle inversion    Ankle eversion    (Blank rows = not tested)  BED MOBILITY:  Independent  TRANSFERS: Independent with sit-stand and chair-chair Floor to stand: reports need for physical assistance after falling or using furniture if intentionally getting on to ground and up again  STAIRS: TBD GAIT: Findings: Comments: ambulates independently level surfaces, no AD in past  FUNCTIONAL TESTS:  Mini-BESTest: TBD Berg Balance Test: 45/56 : TBD  PATIENT SURVEYS:  Freezing of gait questionnaire TBD                                                                                                                              TREATMENT DATE:     PATIENT EDUCATION: Education details: assessment details Person educated: Patient and Spouse Education method:  Explanation Education comprehension: verbalized understanding  HOME EXERCISE PROGRAM: TBD  GOALS: Goals reviewed with patient? Yes  SHORT TERM GOALS: Target date: 07/21/2023    Patient will be independent in HEP to improve functional outcomes Baseline: Goal status: MET  2.  Demo improved BLE strength and balance per time < 15 sec 5xSTS test Baseline: 20 sec; 10 sec Goal status: MET  3.  Demo improved static balance and reduced risk for falls per score 50/56 Berg Balance Test Baseline: 45/56; 49/56 Goal status: IN PROGRESS 08/04/23    LONG TERM GOALS: Target date: 09/22/2023      Demo improved mobility and reduced risk for falls per score 24/28 Mini-BESTest Baseline: 15/28; 19/28 Goal status: IN PROGRESS 08/11/23  2.  Demo reduced risk for falls per time 4.8 sec Baseline: 8.05 sec; 7.31 sec Goal status: IN PROGRESS 08/11/23  3.  Patient to ambulate 1100 feet without AD during without lightheadedness.  Baseline: 795 feet without AD. Required sit break at 5 min 30 sec 07/07/23; 778 ft in 6 MWT with rollator, no rest break Goal status: IN PROGRESS 07/07/23  4.  Teach back relevant programs/activities for fitness/exercise for those w/ PD to improve carryover at D/C Baseline:  Goal status: INITIAL   ASSESSMENT:  CLINICAL IMPRESSION: Reports improved orthostatic symptoms since recent hospitalization and medication reconciliation. Initiated with strength training heavy resistance to improve LE power/activity tolerance. Gait training strategies to improve stride length, step height, and single limb support for negotiation of obstacles requiring CGA-SBA due to unsteadiness. Treadmill training for increase of speed and endurance with forced pace training w/ progressive increase tolerating very well w/out SOB but requiring UE support and CGA due to some unsteadiness with increased speeds.  Continued sessions to progress POC details to improve mobility and reduce risk for  falls.   OBJECTIVE IMPAIRMENTS: Abnormal gait, decreased activity tolerance, decreased balance, decreased coordination, decreased endurance, decreased knowledge of use of DME, difficulty walking, decreased strength, dizziness, and postural dysfunction.   ACTIVITY LIMITATIONS: carrying, lifting, bending, squatting, transfers, reach over head, and locomotion level  PARTICIPATION LIMITATIONS: meal  prep, cleaning, laundry, interpersonal relationship, shopping, community activity, and yard work  PERSONAL FACTORS: Age, Time since onset of injury/illness/exacerbation, and 3+ comorbidities: PMH are also affecting patient's functional outcome.   REHAB POTENTIAL: Good  CLINICAL DECISION MAKING: Evolving/moderate complexity  EVALUATION COMPLEXITY: Moderate  PLAN:  PT FREQUENCY: 1-2x/week  PT DURATION: 6 weeks  PLANNED INTERVENTIONS: 97750- Physical Performance Testing, 97110-Therapeutic exercises, 97530- Therapeutic activity, [id number]- Neuromuscular re-education, 97535- Self Care, [id number]- Manual therapy, [id number]- Gait training, [phone]- Orthotic Initial, [phone]- Canalith repositioning, and [phone]- Aquatic Therapy  PLAN FOR NEXT SESSION: Continue balance activities, posture, functional strength; discuss walking program as part of HEP with his 4WW; progress endurance and stamina, balance while monitoring BP  4:18 PM, 08/25/23 M. Kelly Alferd Obryant, PT, DPT Physical Therapist- [place] Office Number: [phone]

## 2023-08-25 NOTE — Progress Notes (Signed)
 Cardiology Office Note:  .   Date:  08/25/2023  ID:  Anthony [name], DOB 1945/08/02, MRN [id number] PCP: Anthony Charlie ORN, MD  [facility] Providers Cardiologist:  Anthony Lawrence, MD PCP: Anthony Charlie ORN, MD  Chief Complaint  Patient presents with   Hypertension   Hyperlipidemia   Coronary Artery Disease      History of Present Illness: .    Anthony [name] is a 78 y.o. male with hypertension, hyperlipidemia, CAD, type 2 DM, mild AS, OSA on CPAP, hypothyroidism, Parkinson's disease, GERD   After his last visit with me on 06/20/2023, I had admitted to the hospital for acute on chronic HFpEF.  It appears that he was discharged home on Jardiance  25 mg daily, Imdur  30 mg daily, losartan  25 mg daily, Lasix  80 mg twice daily.  Patient was seen in the office on 07/08/2023 and subsequently hospitalized with near syncope, with reduction in Imdur  dose, and subsequently stoppage of Jardiance  and Lasix .  Patient is here today with his wife.  His blood pressure remains low.  However, he is feeling much better.  Lightheadedness has resolved.  He has not had any gait imbalance issues.  Leg edema persists.  Vitals:   08/25/23 0943  BP: (!) 85/52  Pulse: 75  SpO2: 96%       ROS:  Review of Systems  Cardiovascular:  Positive for leg swelling and orthopnea. Negative for chest pain, dyspnea on exertion, palpitations and syncope.     Studies Reviewed: SABRA        EKG 06/20/2023: Sinus bradycardia Rightward axis Possible Inferior infarct , age undetermined When compared with ECG of 28-Aug-2021 11:57, Borderline criteria for Inferior infarct are now Present Non-specific change in ST segment in Inferior leads T wave inversion now evident in Inferior leads T wave inversion now evident in Lateral leads    Echocardiogram 05/2023:  1. Left ventricular ejection fraction, by estimation, is >75%. The left  ventricle has hyperdynamic function. The left ventricle has no  regional  wall motion abnormalities. There is mild left ventricular hypertrophy.  Left ventricular diastolic parameters were normal.   2. Right ventricular systolic function is mildly reduced. The right  ventricular size is normal. Tricuspid regurgitation signal is inadequate  for assessing PA pressure.   3. The mitral valve is grossly normal. No evidence of mitral valve  regurgitation. No evidence of mitral stenosis.   4. The aortic valve was not well visualized. Unable to determine aortic  valve morphology due to image quality. Aortic valve regurgitation is not  visualized. Visually aortic valve is calcified, reduced leaflet excursion.  Visually appears more stenotic  but hemodynamically mild aortic stenosis (at RSB peak velocity 2.72m/s, MG  , AVA VTI 1.46cm2).   5. The inferior vena cava is dilated in size with <50% respiratory  variability, suggesting right atrial pressure of 15 mmHg.   Comparison(s): A prior study was performed on 02/26/2023. Reported LVEF 60  to 65%, grade 1 diastolic dysfunction, average GLS -26.6%, mild aortic  stenosis, estimated RAP 15 mmHg.    Labs 4-05/2023: HbA1C 6.0%  09/2022: Chol 141, TG 167, HDL 35, LDL 77 HbA1C 6.0% Hb 13.5 Cr 0.9  Physical Exam:   Physical Exam Vitals and nursing note reviewed.  Constitutional:      General: He is not in acute distress. Neck:     Vascular: No JVD.  Cardiovascular:     Rate and Rhythm: Normal rate and regular rhythm.     Heart  sounds: Murmur heard.     Harsh midsystolic murmur is present with a grade of 2/6 at the upper right sternal border radiating to the neck.  Pulmonary:     Effort: Pulmonary effort is normal.     Breath sounds: Normal breath sounds. No wheezing or rales.  Abdominal:     Comments: Distension  Musculoskeletal:     Right lower leg: Edema (1+) present.     Left lower leg: Edema (1+) present.      VISIT DIAGNOSES:   ICD-10-CM   1. Chronic right-sided heart failure (HCC)   I50.812 ECHOCARDIOGRAM COMPLETE    2. Chronic heart failure with preserved ejection fraction (HCC)  I50.32     3. Coronary artery disease of native artery of native heart with stable angina pectoris (HCC)  I25.118          ASSESSMENT AND PLAN: .    Anthony [name] is a 78 y.o. male with hypertension, hyperlipidemia, mod nonobstructive CAD, type 2 DM, OSA on CPAP, hypothyroidism, GERD, HFpEF  HFpEF: Hyperdynamic LV function, with mildly reduced RV function on echocardiogram 06/2023. Symptoms were consistent with right-sided heart failure. He did not tolerate diuretics including Lasix  and Jardiance , as well as losartan  due to significant hypotension. I do think right heart failure, as well as component of autonomic dysfunction may have contributed to his original symptoms of hypotension in the setting of medication use. Recommend staying off all of the above medications.  Recommend wearing compression stockings regularly.  Keep legs elevated at night. Repeat echocardiogram next month.    CAD: No anginal symptoms. Given low normal blood pressure, stop Imdur  50 mg daily. Continue aspirin , statin.   Type 2 diabetes mellitus: Continue follow-up with PCP.   Presyncope/fall: Suspect combination of Parkinson's disease, hypertension, question neuropathy.  Hypertension: Controlled   Parkinson's disease: Continue baseline Parkinson's medications.  F/u in 6 weeks, after echocardiogram  Signed, Anthony JINNY Lawrence, MD

## 2023-08-25 NOTE — Patient Instructions (Signed)
 Medication Instructions:  STOP Imdur    *If you need a refill on your cardiac medications before your next appointment, please call your pharmacy*  Testing/Procedures: Echo 09/2023  Your physician has requested that you have an echocardiogram. Echocardiography is a painless test that uses sound waves to create images of your heart. It provides your doctor with information about the size and shape of your heart and how well your heart's chambers and valves are working. This procedure takes approximately one hour. There are no restrictions for this procedure. Please do NOT wear cologne, perfume, aftershave, or lotions (deodorant is allowed). Please arrive 15 minutes prior to your appointment time.  Please note: We ask at that you not bring children with you during ultrasound (echo/ vascular) testing. Due to room size and safety concerns, children are not allowed in the ultrasound rooms during exams. Our front office staff cannot provide observation of children in our lobby area while testing is being conducted. An adult accompanying a patient to their appointment will only be allowed in the ultrasound room at the discretion of the ultrasound technician under special circumstances. We apologize for any inconvenience.   Follow-Up: At [facility], you and your health needs are our priority.  As part of our continuing mission to provide you with exceptional heart care, our providers are all part of one team.  This team includes your primary Cardiologist (physician) and Advanced Practice Providers or APPs (Physician Assistants and Nurse Practitioners) who all work together to provide you with the care you need, when you need it.  Your next appointment:   4-6 weeks   Provider:   Newman JINNY Lawrence, MD

## 2023-09-01 ENCOUNTER — Ambulatory Visit: Admitting: Physical Therapy

## 2023-09-01 ENCOUNTER — Encounter: Payer: Self-pay | Admitting: Physical Therapy

## 2023-09-01 DIAGNOSIS — R2689 Other abnormalities of gait and mobility: Secondary | ICD-10-CM

## 2023-09-01 DIAGNOSIS — R2681 Unsteadiness on feet: Secondary | ICD-10-CM

## 2023-09-01 NOTE — Therapy (Signed)
 OUTPATIENT PHYSICAL THERAPY NEURO TREATMENT   Patient Name: Anthony [name] MRN: [id number] DOB:01-27-1945, 78 y.o., male Today's Date: 09/01/2023   PCP: Dohmeier, Dedra REFERRING PROVIDER: Natalia Waddell LABOR, PA-C     END OF SESSION:  PT End of Session - 09/01/23 1539     Visit Number 15    Number of Visits 19    Date for PT Re-Evaluation 09/22/23    Authorization Type [employer]    Progress Note Due on Visit 20    PT Start Time 1536    PT Stop Time 1614    PT Time Calculation (min) 38 min    Equipment Utilized During Treatment Gait belt   wearing compression stockings   Activity Tolerance Patient tolerated treatment well    Behavior During Therapy WFL for tasks assessed/performed                  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   Neuromuscular disorder (HCC) 2023   Parkinson's Disease   Neuropathy    left leg greater than right leg   Obesity (BMI 30.0-34.9) 05/14/2013   OSA on CPAP    cpap setting of 10/ 13   Pancreatitis dx march 2016   Pneumonia 12 years ago   Past Surgical History:  Procedure Laterality Date   ANTERIOR CERVICAL DECOMP/DISCECTOMY FUSION N/A 08/02/2022   Procedure: Anterior Cervical Decompression/Discectomy Fusion - Cervical Three-Cervical Four,  Cervical Four-Cervical Five,  remove Cervical Five-Cervical Six Plate;  Surgeon: Joshua Alm Hamilton, MD;  Location: [facility] OR;  Service: Neurosurgery;  Laterality: N/A;  3C   BACK SURGERY  10/2009   Cervical, arterior   CARPAL  TUNNEL RELEASE Left 2003   CARPAL TUNNEL RELEASE Bilateral    CATARACT EXTRACTION Bilateral 01/2012   COLONOSCOPY  2024   CORONARY BALLOON ANGIOPLASTY N/A 08/28/2021   Procedure: CORONARY BALLOON ANGIOPLASTY;  Surgeon: Elmira Newman PARAS, MD;  Location: [facility] INVASIVE [facility] LAB;  Service: Cardiovascular;  Laterality: N/A;   EUS N/A 07/15/2014   Procedure: FULL UPPER ENDOSCOPIC ULTRASOUND (EUS) RADIAL;  Surgeon: Belvie Just, MD;  Location: [facility] ENDOSCOPY;  Service: Endoscopy;  Laterality: N/A;   LEFT HEART CATH AND CORONARY ANGIOGRAPHY N/A 05/18/2019   Procedure: LEFT HEART CATH AND CORONARY ANGIOGRAPHY;  Surgeon: Ladona Heinz, MD;  Location: [facility] INVAS[facility] CV LAB;  Service: Cardiovascular;  Laterality: N/A;   LEFT HEART CATH AND CORONARY ANGIOGRAPHY N/A 08/28/2021   Procedure: LEFT HEART CATH AND CORONARY ANGIOGRAPHY;  Surgeon: Elmira Newman PARAS, MD;  Location: [facility] INVASIVE CV LAB;  Service: Cardiovascular;  Laterality: N/A;   NASAL SINUS SURGERY  1981   RETINAL Bilateral 06/2013   Retinal peel   SHOULDER SURGERY Right 2003   TONSILLECTOMY  1954   Patient Active Problem List   Diagnosis Date Noted   Hypotension 08/15/2023   (HFpEF) heart failure with preserved ejection fraction (HCC) 06/24/2023  Mild aortic stenosis 06/24/2023   Hypothyroidism 06/24/2023   Heart failure (HCC) 06/20/2023   Acute right-sided congestive heart failure (HCC) 06/20/2023   Shortness of breath 06/20/2023   Nonrheumatic aortic valve stenosis 02/07/2023   Hypersomnia, persistent 12/26/2022   Nasal congestion 12/26/2022   Post traumatic stress disorder (PTSD) 12/26/2022   S/P cervical spinal fusion 08/02/2022   Parkinsonism (HCC) 12/06/2021   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/06/2021   Hyperkalemia 12/06/2020   CAD (coronary artery disease) 12/03/2019   Esophageal dysphagia 11/21/2019   Gastroesophageal reflux disease 11/21/2019   Family history of colon cancer 11/21/2019   Abnormal stress test  05/11/2019   Mixed hyperlipidemia 05/11/2019   Angina pectoris (HCC) 08/07/2018   Essential hypertension 08/07/2018   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, with long-term current use of insulin  (HCC) 08/07/2018   Morbid obesity (HCC) 08/28/2017   Neuropathy 08/28/2017   Numbness and tingling of both legs below knees 11/10/2013   OSA on CPAP 05/14/2013   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  Have not had any more episodes of lightheadedness.  No falls.  Feel like the strength in my legs is coming back.  Pt accompanied by: significant other  PERTINENT HISTORY: hypertension, hyperlipidemia, CAD, type 2 DM, mild AS, OSA on CPAP, hypothyroidism, Parkinson's disease, GERD.  PAIN:  Are you having pain? No  PRECAUTIONS: Fall  RED FLAGS: None   WEIGHT BEARING RESTRICTIONS: No  FALLS: Has patient fallen in last 6 months? Yes. Number of falls 7  LIVING ENVIRONMENT: Lives with: lives with their family and lives with their spouse Lives in: House/apartment Stairs: Yes, exterior: 4 steps; Ground-floor set-up Has following equipment at home: None  PLOF: Independent with basic ADLs  PATIENT GOALS: improve balance  OBJECTIVE:   Discussed options for gym activities-he and wife may look into [employer].    TODAY'S TREATMENT: 09/01/2023 Activity Comments  Vitals:  114/74, HR 67 bpm seated  LAQ 3x15 10#   Standing balance exercises:  Forward/back walking x 2 min Sidestepping x 2 min  Standing on Airex:  forward/back stepping Sidestep off/on Airex 5# BLEs  Four square step -clockwise and counterclockwise Multiple reps, some difficulty going backward directions; cues for wide BOS and balance  Gait 85 ft x 3 Cues for increased L heelstrike/foot clearance, difficulty with coordinating arm swing            HOME EXERCISE PROGRAM: Access Code: [email or web address] URL: [email or web address] Date: 07/16/2023 Prepared by: [facility] - Outpatient  Rehab - [facility]  Exercises - Sitting Knee Extension with Resistance  - 1 x daily - 5 x weekly - 3 sets - 10 reps - Seated March with Resistance  - 1 x daily - 5 x weekly - 3 sets - 10 reps - Seated Hip Abduction with Resistance  - 1 x daily - 5 x weekly - 3 sets - 10 reps - Seated Hamstring Curls with Resistance  - 1 x daily - 5 x weekly - 3 sets - 10 reps - Mini Squat with Counter Support  - 1 x daily - 7 x weekly - 2-3 sets - 10 reps - Feet Together Balance at [employer] Eyes Closed  - 1 x daily - 7 x weekly - 3 sets - 10 sec hold - Corner Balance Feet Together: Eyes Closed With Head Turns  - 1 x daily - 7 x weekly - 3 sets - 30 sec hold   PATIENT EDUCATION: Education details: 09/01/2023:  Discussed cues to improve L foot clearance, deliberate effort to increase LLE with gait; began to discuss gym options-pt and wife are looking into [employer] Person educated: Patient Education method: Explanation Education comprehension: verbalized understanding    Note: Objective measures were completed at Evaluation unless otherwise noted.  DIAGNOSTIC FINDINGS:   Vitals: 107/67 mmHg, 91 bpm  COGNITION: Overall cognitive status: Within functional limits for tasks assessed   SENSATION: Not tested, reports hx of neuropathy in fingers  COORDINATION: Difficulty to rapid alternating movement Heel to shin WNL  EDEMA:  None at present,  currently daily weights for CHF  MUSCLE TONE: no hypertonicity noted  MUSCLE LENGTH: WNL  DTRs:  NT  POSTURE: rounded shoulders and forward head  LOWER EXTREMITY ROM:     Active  Right Eval Left Eval  Hip flexion    Hip extension    Hip abduction    Hip adduction    Hip internal rotation    Hip external rotation    Knee flexion    Knee extension    Ankle dorsiflexion    Ankle plantarflexion    Ankle inversion    Ankle eversion     (Blank rows = not tested)  LOWER EXTREMITY MMT:    MMT Right Eval Left Eval  Hip flexion    Hip extension    Hip abduction    Hip adduction    Hip internal rotation    Hip external rotation    Knee flexion    Knee extension    Ankle dorsiflexion    Ankle plantarflexion    Ankle inversion    Ankle eversion    (Blank rows = not tested)  BED MOBILITY:  Independent  TRANSFERS: Independent with sit-stand and chair-chair Floor to stand: reports need for physical assistance after falling or using furniture if intentionally getting on to ground and up again  STAIRS: TBD GAIT: Findings: Comments: ambulates independently level surfaces, no AD in past  FUNCTIONAL TESTS:  Mini-BESTest: TBD Berg Balance Test: 45/56 : TBD  PATIENT SURVEYS:  Freezing of gait questionnaire  TBD                                                                                                                              TREATMENT DATE:     PATIENT EDUCATION: Education details: assessment details Person educated: Patient and Spouse Education method: Explanation Education comprehension: verbalized understanding  HOME EXERCISE PROGRAM: TBD  GOALS: Goals reviewed with patient? Yes  SHORT TERM GOALS: Target date: 07/21/2023    Patient will be independent in HEP to improve functional outcomes Baseline: Goal status: MET  2.  Demo improved BLE strength and balance per time < 15 sec 5xSTS test Baseline: 20 sec; 10 sec Goal status:  MET  3.  Demo improved static balance and reduced risk for falls per score 50/56 Berg Balance Test Baseline: 45/56; 49/56 Goal status: IN PROGRESS 08/04/23    LONG TERM GOALS: Target date: 09/22/2023      Demo improved mobility and reduced risk for falls per score 24/28 Mini-BESTest Baseline: 15/28; 19/28 Goal status: IN PROGRESS 08/11/23  2.  Demo reduced risk for falls per time 4.8 sec Baseline: 8.05 sec; 7.31 sec Goal status: IN PROGRESS 08/11/23  3.  Patient to ambulate 1100 feet without AD during without lightheadedness.  Baseline: 795 feet without AD. Required sit break at 5 min 30 sec 07/07/23; 778 ft in 6 MWT with rollator, no rest break Goal status: IN PROGRESS 07/07/23  4.  Teach back relevant programs/activities for fitness/exercise for those w/ PD to improve carryover at D/C Baseline:  Goal status: INITIAL   ASSESSMENT:  CLINICAL IMPRESSION: Pt presents today and continues to feel good with no episodes of low blood pressure or near passing out since ED visit. Skilled PT session focused on lower extremity strengthening and balance.  He does have some decreased L foot clearance and unsteadiness with challenge of multi-directional stepping in four-square step activity.  He responds well to cues for attention to increased L foot clearance and intensity of movement pattern to improve foot clearance. He will continue to benefit from skilled PT towards goals for improved functional mobility and decreased fall risk.   OBJECTIVE IMPAIRMENTS: Abnormal gait, decreased activity tolerance, decreased balance, decreased coordination, decreased endurance, decreased knowledge of use of DME, difficulty walking, decreased strength, dizziness, and postural dysfunction.   ACTIVITY LIMITATIONS: carrying, lifting, bending, squatting, transfers, reach over head, and locomotion level  PARTICIPATION LIMITATIONS: meal prep, cleaning, laundry, interpersonal relationship, shopping, community  activity, and yard work  PERSONAL FACTORS: Age, Time since onset of injury/illness/exacerbation, and 3+ comorbidities: PMH are also affecting patient's functional outcome.   REHAB POTENTIAL: Good  CLINICAL DECISION MAKING: Evolving/moderate complexity  EVALUATION COMPLEXITY: Moderate  PLAN:  PT FREQUENCY: 1-2x/week  PT DURATION: 6 weeks  PLANNED INTERVENTIONS: 97750- Physical Performance Testing, 97110-Therapeutic exercises, 97530- Therapeutic activity, [id number]- Neuromuscular re-education, 97535- Self Care, 02859- Manual therapy, [id number]- Gait training, [phone]- Orthotic Initial, [phone]- Canalith repositioning, and [id number]- Aquatic Therapy  PLAN  FOR NEXT SESSION: Continue balance activities, posture, functional strength; discuss walking program as part of HEP with/without his 4WW; progress endurance and stamina, balance exercises (?PWR! Moves standing now that his BP is more stable?)  Greig Anon, PT 09/01/23 5:25 PM Phone: [phone] Fax: [phone]  [facility] at [facility] [address], [address] [place], [place] [address] Phone # [phone] Fax # [phone]

## 2023-09-08 ENCOUNTER — Ambulatory Visit: Admitting: Physical Therapy

## 2023-09-08 ENCOUNTER — Encounter: Payer: Self-pay | Admitting: Physical Therapy

## 2023-09-08 DIAGNOSIS — R2681 Unsteadiness on feet: Secondary | ICD-10-CM

## 2023-09-08 DIAGNOSIS — M6281 Muscle weakness (generalized): Secondary | ICD-10-CM

## 2023-09-08 DIAGNOSIS — R2689 Other abnormalities of gait and mobility: Secondary | ICD-10-CM

## 2023-09-08 NOTE — Therapy (Signed)
 OUTPATIENT PHYSICAL THERAPY NEURO TREATMENT   Patient Name: Anthony [name] MRN: [id number] DOB:07/29/1945, 78 y.o., male Today's Date: 09/08/2023   PCP: Dohmeier, Dedra REFERRING PROVIDER: Natalia Waddell LABOR, PA-C     END OF SESSION:  PT End of Session - 09/08/23 1533     Visit Number 16    Number of Visits 19    Date for PT Re-Evaluation 09/22/23    Authorization Type [employer]    Progress Note Due on Visit 20    PT Start Time 1534    PT Stop Time 1620    PT Time Calculation (min) 46 min    Equipment Utilized During Treatment Gait belt    Activity Tolerance Patient tolerated treatment well    Behavior During Therapy WFL for tasks assessed/performed                  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   Neuromuscular disorder (HCC) 2023   Parkinson's Disease   Neuropathy    left leg greater than right leg   Obesity (BMI 30.0-34.9) 05/14/2013   OSA on CPAP    cpap setting of 10/ 13   Pancreatitis dx march 2016   Pneumonia 12 years ago   Past Surgical History:  Procedure Laterality Date   ANTERIOR CERVICAL DECOMP/DISCECTOMY FUSION N/A 08/02/2022   Procedure: Anterior Cervical Decompression/Discectomy Fusion - Cervical Three-Cervical Four,  Cervical Four-Cervical Five,  remove Cervical Five-Cervical Six Plate;  Surgeon: Joshua Alm Hamilton, MD;  Location: [facility] OR;  Service: Neurosurgery;  Laterality: N/A;  3C   BACK SURGERY  10/2009   Cervical, arterior   CARPAL TUNNEL RELEASE Left 2003   CARPAL  TUNNEL RELEASE Bilateral    CATARACT EXTRACTION Bilateral 01/2012   COLONOSCOPY  2024   CORONARY BALLOON ANGIOPLASTY N/A 08/28/2021   Procedure: CORONARY BALLOON ANGIOPLASTY;  Surgeon: Elmira Newman PARAS, MD;  Location: [facility];  Service: Cardiovascular;  Laterality: N/A;   EUS N/A 07/15/2014   Procedure: FULL UPPER ENDOSCOPIC ULTRASOUND (EUS) RADIAL;  Surgeon: Belvie Just, MD;  Location: [facility];  Service: Endoscopy;  Laterality: N/A;   LEFT HEART CATH AND CORONARY ANGIOGRAPHY N/A 05/18/2019   Procedure: LEFT HEART CATH AND CORONARY ANGIOGRAPHY;  Surgeon: Ladona Heinz, MD;  Location: [facility];  Service: Cardiovascular;  Laterality: N/A;   LEFT HEART CATH AND CORONARY ANGIOGRAPHY N/A 08/28/2021   Procedure: LEFT HEART CATH AND CORONARY ANGIOGRAPHY;  Surgeon: Elmira Newman PARAS, MD;  Location: [facility];  Service: Cardiovascular;  Laterality: N/A;   NASAL SINUS SURGERY  1981   RETINAL Bilateral 06/2013   Retinal peel   SHOULDER SURGERY Right 2003   TONSILLECTOMY  1954   Patient Active Problem List   Diagnosis Date Noted   Hypotension 08/15/2023   (HFpEF) heart failure with preserved ejection fraction (HCC) 06/24/2023   Mild aortic stenosis 06/24/2023  Hypothyroidism 06/24/2023   Heart failure (HCC) 06/20/2023   Acute right-sided congestive heart failure (HCC) 06/20/2023   Shortness of breath 06/20/2023   Nonrheumatic aortic valve stenosis 02/07/2023   Hypersomnia, persistent 12/26/2022   Nasal congestion 12/26/2022   Post traumatic stress disorder (PTSD) 12/26/2022   S/P cervical spinal fusion 08/02/2022   Parkinsonism (HCC) 12/06/2021   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/06/2021   Hyperkalemia 12/06/2020   CAD (coronary artery disease) 12/03/2019   Esophageal dysphagia 11/21/2019   Gastroesophageal reflux disease 11/21/2019   Family history of colon cancer 11/21/2019   Abnormal stress test 05/11/2019   Mixed hyperlipidemia  05/11/2019   Angina pectoris (HCC) 08/07/2018   Essential hypertension 08/07/2018   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  Wife wants me to work on posture.  THERAPY DIAG:  Other abnormalities of gait and mobility  Unsteadiness on feet  Muscle weakness (generalized)  Rationale for Evaluation and Treatment: Rehabilitation  SUBJECTIVE:                                                                                                                                                                                             SUBJECTIVE STATEMENT: Things going great.  BP continues to stay above 100. (This am was 112/64, HR 72). Pt accompanied by: significant other  PERTINENT HISTORY: hypertension, hyperlipidemia, CAD, type 2 DM, mild AS, OSA on CPAP, hypothyroidism, Parkinson's disease, GERD.  PAIN:  Are you having pain? No  PRECAUTIONS: Fall  RED FLAGS: None   WEIGHT BEARING RESTRICTIONS: No  FALLS: Has patient fallen in last 6 months? Yes. Number of falls 7  LIVING ENVIRONMENT: Lives with: lives with their family and lives with their spouse Lives in: House/apartment Stairs: Yes, exterior: 4 steps; Ground-floor set-up Has following equipment at home: None  PLOF: Independent with basic ADLs  PATIENT GOALS: improve balance  OBJECTIVE:    TODAY'S TREATMENT: 09/08/2023 Activity Comments  Seated PWR! Moves: PWR! Up x 10 PWR! Rock x 10 PWR! Twist x 10 PWR! Step x 10  With added yellow theraband  Standing PWR! Moves: PWR! Up x  10 PWR! Rock x 10 PWR! Twist x 10 PWR! Step x 10  At parallel bars with PT visual/verbal cues Some difficulty coordinating Mild imbalance forward  Postural exercise standing at doorframe:  neck retraction with towel roll, 2 x 5 Scapular retraction 2 x 5 Tall as the wall posture x 30 sec   Gait 50 ft x 4 reps with cues for visual target to improve posture supervision  Backwards gait, 3 M  8.38 sec, 7.38 sec, 6.78 sec            HOME EXERCISE PROGRAM: Access Code: [email or web address] URL: [email or web address] Date: 07/16/2023>09/08/2023 Prepared by: [facility] - Outpatient  Rehab - [facility]  SEATED PWR! MOVES, 10 reps 3-5x/week  Exercises - Sitting Knee Extension with Resistance  - 1 x daily - 5 x weekly - 3 sets - 10 reps - Seated March with Resistance  - 1 x daily - 5 x weekly - 3 sets - 10 reps - Seated Hip Abduction with Resistance  - 1 x daily - 5 x weekly - 3 sets - 10 reps - Seated Hamstring Curls with Resistance  - 1 x daily - 5 x weekly - 3 sets - 10 reps - Mini Squat with Counter Support  - 1 x daily - 7 x weekly - 2-3 sets - 10 reps - Feet Together Balance at [employer] Eyes Closed  - 1 x daily - 7 x weekly - 3 sets - 10 sec hold - Corner Balance Feet Together: Eyes Closed With Head Turns  - 1 x daily - 7 x weekly - 3 sets - 30 sec hold   PATIENT EDUCATION: Education details: 09/08/2023-Addition to HEP for PWR! Moves sitting; postural awareness with standing and gait; provided information on PD symposium Person educated: Patient and Spouse Education method: Explanation, Demonstration, Verbal cues, and Handouts Education comprehension: verbalized understanding, returned demonstration, and needs further education    Note: Objective measures were completed at Evaluation unless otherwise noted.  DIAGNOSTIC FINDINGS:   Vitals: 107/67 mmHg, 91 bpm  COGNITION: Overall cognitive status: Within functional limits for tasks assessed   SENSATION: Not  tested, reports hx of neuropathy in fingers  COORDINATION: Difficulty to rapid alternating movement Heel to shin WNL  EDEMA:  None at present, currently daily weights for CHF  MUSCLE TONE: no hypertonicity noted  MUSCLE LENGTH: WNL  DTRs:  NT  POSTURE: rounded shoulders and forward head  LOWER EXTREMITY ROM:     Active  Right Eval Left Eval  Hip flexion    Hip extension    Hip abduction    Hip adduction    Hip internal rotation    Hip external rotation    Knee flexion    Knee extension    Ankle dorsiflexion    Ankle plantarflexion    Ankle inversion    Ankle eversion     (Blank rows = not tested)  LOWER EXTREMITY MMT:    MMT Right Eval Left Eval  Hip flexion    Hip extension    Hip abduction    Hip adduction    Hip internal rotation    Hip external rotation    Knee flexion    Knee extension    Ankle dorsiflexion    Ankle plantarflexion    Ankle inversion    Ankle eversion    (Blank rows = not tested)  BED MOBILITY:  Independent  TRANSFERS: Independent with sit-stand and chair-chair Floor to stand: reports need for physical assistance after falling or using furniture if intentionally getting on to ground and up again  STAIRS: TBD GAIT: Findings: Comments: ambulates independently level surfaces, no AD in past  FUNCTIONAL TESTS:  Mini-BESTest: TBD Berg Balance Test: 45/56 : TBD  PATIENT SURVEYS:  Freezing of gait questionnaire TBD                                                                                                                              TREATMENT DATE:     PATIENT EDUCATION: Education details: assessment details Person educated: Patient and Spouse Education method: Explanation Education comprehension: verbalized understanding  HOME EXERCISE PROGRAM: TBD  GOALS: Goals reviewed with patient? Yes  SHORT TERM GOALS: Target date: 07/21/2023    Patient will be independent in HEP to improve functional  outcomes Baseline: Goal status: MET  2.  Demo improved BLE strength and balance per time < 15 sec 5xSTS test Baseline: 20 sec; 10 sec Goal status: MET  3.  Demo improved static balance and reduced risk for falls per score 50/56 Berg Balance Test Baseline: 45/56; 49/56 Goal status: IN PROGRESS 08/04/23    LONG TERM GOALS: Target date: 09/22/2023      Demo improved mobility and reduced risk for falls per score 24/28 Mini-BESTest Baseline: 15/28; 19/28 Goal status: IN PROGRESS 08/11/23  2.  Demo reduced risk for falls per time 4.8 sec Baseline: 8.05 sec; 7.31 sec Goal status: IN PROGRESS 08/11/23  3.  Patient to ambulate 1100 feet without AD during without lightheadedness.  Baseline: 795 feet without AD. Required sit break at 5 min 30 sec 07/07/23; 778 ft in 6 MWT with rollator, no rest break Goal status: IN PROGRESS 07/07/23  4.  Teach back relevant programs/activities for fitness/exercise for those w/ PD to improve carryover at D/C Baseline:  Goal status: INITIAL   ASSESSMENT:  CLINICAL IMPRESSION: Pt presents today and continues to report feeling good. Skilled PT session focused on seated and standing PWR! Moves, and provided seated PWR! Moves for home (as he had several moments of instability with standing PWR! Moves-didn't feel comfortable adding to home).  Worked on postural awareness and strengthening, which he improves with repetition and cues.  Pt will continue to benefit from skilled PT towards goals for improved functional mobility and decreased fall risk.   OBJECTIVE IMPAIRMENTS: Abnormal gait, decreased activity tolerance, decreased balance, decreased coordination, decreased endurance, decreased knowledge of use of DME, difficulty walking, decreased strength, dizziness, and postural dysfunction.   ACTIVITY LIMITATIONS: carrying, lifting, bending, squatting, transfers, reach over head, and locomotion level  PARTICIPATION LIMITATIONS: meal prep, cleaning,  laundry, interpersonal relationship, shopping, community activity, and yard work  PERSONAL FACTORS: Age, Time since onset of injury/illness/exacerbation, and 3+ comorbidities: PMH are also affecting patient's functional outcome.   REHAB POTENTIAL: Good  CLINICAL DECISION MAKING: Evolving/moderate complexity  EVALUATION COMPLEXITY: Moderate  PLAN:  PT FREQUENCY: 1-2x/week  PT DURATION: 6 weeks  PLANNED INTERVENTIONS: 97750- Physical Performance Testing, 97110-Therapeutic exercises, 97530- Therapeutic activity, [id number]- Neuromuscular re-education, 97535- Self Care, [id number]- Manual  therapy, [id number]- Gait training, 02239- Orthotic Initial, [phone]- Canalith repositioning, and [id number]- Aquatic Therapy  PLAN FOR NEXT SESSION:  Try PWR! Moves again standing and provide for HEP (at counter for safety).  Postural strengthening; Check LTGs and discuss POC (next week). Consider discussing community fitness  Greig Anon, [place] 09/08/23 5:21 PM Phone: [phone] Fax: [phone]  [facility] at [facility] [address] [place], [place] [address] Phone # [phone] Fax # [phone]

## 2023-09-10 NOTE — Therapy (Signed)
 OUTPATIENT PHYSICAL THERAPY NEURO TREATMENT   Patient Name: Anthony [name] MRN: [id number] DOB:[birth year], 78 y.o., male Today's Date: 09/11/2023   PCP: Dohmeier, Dedra REFERRING PROVIDER: Natalia Waddell LABOR, PA-C     END OF SESSION:  PT End of Session - 09/11/23 1611     Visit Number 17    Number of Visits 19    Date for PT Re-Evaluation 09/22/23    Authorization Type [employer]    Progress Note Due on Visit 20    PT Start Time 1529    PT Stop Time 1612    PT Time Calculation (min) 43 min    Equipment Utilized During Treatment Gait belt    Activity Tolerance Patient tolerated treatment well    Behavior During Therapy WFL for tasks assessed/performed                   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   Neuromuscular disorder (HCC) 2023   Parkinson's Disease   Neuropathy    left leg greater than right leg   Obesity (BMI 30.0-34.9) 05/14/2013   OSA on CPAP    cpap setting of 10/ 13   Pancreatitis dx march 2016   Pneumonia 12 years ago   Past Surgical History:  Procedure Laterality Date   ANTERIOR CERVICAL DECOMP/DISCECTOMY FUSION N/A 08/02/2022   Procedure: Anterior Cervical Decompression/Discectomy Fusion - Cervical Three-Cervical Four,  Cervical Four-Cervical Five,  remove Cervical Five-Cervical Six Plate;  Surgeon: Joshua Alm Hamilton, MD;  Location: [facility] OR;  Service: Neurosurgery;  Laterality: N/A;  3C   BACK SURGERY  10/2009   Cervical, arterior   CARPAL TUNNEL RELEASE Left 2003    CARPAL TUNNEL RELEASE Bilateral    CATARACT EXTRACTION Bilateral 01/2012   COLONOSCOPY  2024   CORONARY BALLOON ANGIOPLASTY N/A 08/28/2021   Procedure: CORONARY BALLOON ANGIOPLASTY;  Surgeon: Elmira Newman PARAS, MD;  Location: [facility];  Service: Cardiovascular;  Laterality: N/A;   EUS N/A 07/15/2014   Procedure: FULL UPPER ENDOSCOPIC ULTRASOUND (EUS) RADIAL;  Surgeon: Belvie Just, MD;  Location: [facility];  Service: Endoscopy;  Laterality: N/A;   LEFT HEART CATH AND CORONARY ANGIOGRAPHY N/A 05/18/2019   Procedure: LEFT HEART CATH AND CORONARY ANGIOGRAPHY;  Surgeon: Ladona Heinz, MD;  Location: [facility];  Service: Cardiovascular;  Laterality: N/A;   LEFT HEART CATH AND CORONARY ANGIOGRAPHY N/A 08/28/2021   Procedure: LEFT HEART CATH AND CORONARY ANGIOGRAPHY;  Surgeon: Elmira Newman PARAS, MD;  Location: [facility];  Service: Cardiovascular;  Laterality: N/A;   NASAL SINUS SURGERY  1981   RETINAL Bilateral 06/2013   Retinal peel   SHOULDER SURGERY Right 2003   TONSILLECTOMY  1954   Patient Active Problem List   Diagnosis Date Noted   Hypotension 08/15/2023   (HFpEF) heart failure with preserved ejection fraction (HCC) 06/24/2023   Mild aortic stenosis  06/24/2023   Hypothyroidism 06/24/2023   Heart failure (HCC) 06/20/2023   Acute right-sided congestive heart failure (HCC) 06/20/2023   Shortness of breath 06/20/2023   Nonrheumatic aortic valve stenosis 02/07/2023   Hypersomnia, persistent 12/26/2022   Nasal congestion 12/26/2022   Post traumatic stress disorder (PTSD) 12/26/2022   S/P cervical spinal fusion 08/02/2022   Parkinsonism (HCC) 12/06/2021   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/06/2021   Hyperkalemia 12/06/2020   CAD (coronary artery disease) 12/03/2019   Esophageal dysphagia 11/21/2019   Gastroesophageal reflux disease 11/21/2019   Family history of colon cancer 11/21/2019   Abnormal stress test 05/11/2019   Mixed  hyperlipidemia 05/11/2019   Angina pectoris (HCC) 08/07/2018   Essential hypertension 08/07/2018   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  Wife wants me to work on posture.  THERAPY DIAG:  Other abnormalities of gait and mobility  Unsteadiness on feet  Muscle weakness (generalized)  Difficulty in walking, not elsewhere classified  Rationale for Evaluation and Treatment: Rehabilitation  SUBJECTIVE:                                                                                                                                                                                             SUBJECTIVE STATEMENT: Reports that he sprained his L ankle- jumped up when his wife got sick and tripped on his own feet. Reports the he has been icing it and movement makes it better. Denies excessive swelling or bruising. Reports BP was 114/66 mmHg this AM and denies dizziness.    Pt accompanied by: self  PERTINENT HISTORY: hypertension, hyperlipidemia, CAD, type 2 DM, mild AS, OSA on CPAP, hypothyroidism, Parkinson's disease, GERD.  PAIN:  Are you having pain? No, reports stiffness in L ankle   PRECAUTIONS: Fall  RED FLAGS: None   WEIGHT BEARING RESTRICTIONS: No  FALLS: Has patient fallen in last 6 months? Yes. Number of falls 7  LIVING ENVIRONMENT: Lives with: lives with their family and lives with their spouse Lives in: House/apartment Stairs: Yes, exterior: 4 steps; Ground-floor set-up Has following equipment at home: None  PLOF: Independent with  basic ADLs  PATIENT GOALS: improve balance  OBJECTIVE:  TODAY'S TREATMENT: 09/11/23 Activity Comments  palpation Mild TTP over anterolateral ankle   TM walking B UE support 6 min at 2.5 mph, 1 min cooldown 1.6 mph Dynamic warm up; cueing to achieve moderate intensity. Tendency to drag heels- cueing to correct  Tolerated without ankle pain   review of recent HEP update:  seated PWR moves: up rock twist step Cueing to avoid posterior lean into back rest. Coordinate opposite UE/LE, maintain distinct movements   review and added  to HEP:  standing PWR moves: up rock twist step Demo provided. Patient performed with great technique   Review of sitting PWR rock d/t therapist's error  Pt performed well         PATIENT EDUCATION: Education details: edu and handout on PD community exercise classes; HEP update with edu for safety Person educated: Patient Education method: Explanation, Demonstration, Tactile cues, Verbal cues, and Handouts Education comprehension: verbalized understanding and returned demonstration    HOME EXERCISE PROGRAM: Access Code: [email or web address] URL: [email or web address] Date: 07/16/2023>09/08/2023 Prepared by: [facility] - Outpatient  Rehab - [facility]  SEATED PWR! MOVES, 10 reps 3-5x/week STANDING PWR! MOVES AT COUNTER TOP, 10 reps 3-5x/week  Exercises - Sitting Knee Extension with Resistance  - 1 x daily - 5 x weekly - 3 sets - 10 reps - Seated March with Resistance  - 1 x daily - 5 x weekly - 3 sets - 10 reps - Seated Hip Abduction with Resistance  - 1 x daily - 5 x weekly - 3 sets - 10 reps - Seated Hamstring Curls with Resistance  - 1 x daily - 5 x weekly - 3 sets - 10 reps - Mini Squat with Counter Support  - 1 x daily - 7 x weekly - 2-3 sets - 10 reps - Feet Together Balance at [employer] Eyes Closed  - 1 x daily - 7 x weekly - 3 sets - 10 sec hold - Corner Balance Feet Together: Eyes Closed With Head Turns  - 1 x daily - 7 x  weekly - 3 sets - 30 sec hold   PATIENT EDUCATION: Education details: 09/08/2023-Addition to HEP for PWR! Moves sitting; postural awareness with standing and gait; provided information on PD symposium Person educated: Patient and Spouse Education method: Explanation, Demonstration, Verbal cues, and Handouts Education comprehension: verbalized understanding, returned demonstration, and needs further education    Note: Objective measures were completed at Evaluation unless otherwise noted.  DIAGNOSTIC FINDINGS:   Vitals: 107/67 mmHg, 91 bpm  COGNITION: Overall cognitive status: Within functional limits for tasks assessed   SENSATION: Not tested, reports hx of neuropathy in fingers  COORDINATION: Difficulty to rapid alternating movement Heel to shin WNL  EDEMA:  None at present, currently daily weights for CHF  MUSCLE TONE: no hypertonicity noted  MUSCLE LENGTH: WNL  DTRs:  NT  POSTURE: rounded shoulders and forward head  LOWER EXTREMITY ROM:     Active  Right Eval Left Eval  Hip flexion    Hip extension    Hip abduction    Hip adduction    Hip internal rotation    Hip external rotation    Knee flexion    Knee extension    Ankle dorsiflexion    Ankle plantarflexion    Ankle inversion    Ankle eversion     (Blank rows = not tested)  LOWER EXTREMITY MMT:    MMT Right Eval Left Eval  Hip flexion    Hip extension  Hip abduction    Hip adduction    Hip internal rotation    Hip external rotation    Knee flexion    Knee extension    Ankle dorsiflexion    Ankle plantarflexion    Ankle inversion    Ankle eversion    (Blank rows = not tested)  BED MOBILITY:  Independent  TRANSFERS: Independent with sit-stand and chair-chair Floor to stand: reports need for physical assistance after falling or using furniture if intentionally getting on to ground and up again  STAIRS: TBD GAIT: Findings: Comments: ambulates independently level surfaces, no AD  in past  FUNCTIONAL TESTS:  Mini-BESTest: TBD Berg Balance Test: 45/56 : TBD  PATIENT SURVEYS:  Freezing of gait questionnaire TBD                                                                                                                              TREATMENT DATE:     PATIENT EDUCATION: Education details: assessment details Person educated: Patient and Spouse Education method: Explanation Education comprehension: verbalized understanding  HOME EXERCISE PROGRAM: TBD  GOALS: Goals reviewed with patient? Yes  SHORT TERM GOALS: Target date: 07/21/2023    Patient will be independent in HEP to improve functional outcomes Baseline: Goal status: MET  2.  Demo improved BLE strength and balance per time < 15 sec 5xSTS test Baseline: 20 sec; 10 sec Goal status: MET  3.  Demo improved static balance and reduced risk for falls per score 50/56 Berg Balance Test Baseline: 45/56; 49/56 Goal status: IN PROGRESS 08/04/23    LONG TERM GOALS: Target date: 09/22/2023      Demo improved mobility and reduced risk for falls per score 24/28 Mini-BESTest Baseline: 15/28; 19/28 Goal status: IN PROGRESS 08/11/23  2.  Demo reduced risk for falls per time 4.8 sec Baseline: 8.05 sec; 7.31 sec Goal status: IN PROGRESS 08/11/23  3.  Patient to ambulate 1100 feet without AD during without lightheadedness.  Baseline: 795 feet without AD. Required sit break at 5 min 30 sec 07/07/23; 778 ft in 6 MWT with rollator, no rest break Goal status: IN PROGRESS 07/07/23  4.  Teach back relevant programs/activities for fitness/exercise for those w/ PD to improve carryover at D/C Baseline:  Goal status: IN PROGRESS   ASSESSMENT:  CLINICAL IMPRESSION: Patient arrived to session with report of spraining his L ankle since last session. Mildly TTP over the L anterolateral ankle but no excessive swelling and pt denies bruising. Worked on reviewing PWR moves in sitting and standing to  assess form and carryover. Corrective cueing required for sitting tasks, however pt demonstrated standing movements with great form and stability. No complaints upon leaving.   OBJECTIVE IMPAIRMENTS: Abnormal gait, decreased activity tolerance, decreased balance, decreased coordination, decreased endurance, decreased knowledge of use of DME, difficulty walking, decreased strength, dizziness, and postural dysfunction.   ACTIVITY LIMITATIONS: carrying, lifting, bending, squatting, transfers, reach over head, and locomotion  level  PARTICIPATION LIMITATIONS: meal prep, cleaning, laundry, interpersonal relationship, shopping, community activity, and yard work  PERSONAL FACTORS: Age, Time since onset of injury/illness/exacerbation, and 3+ comorbidities: PMH are also affecting patient's functional outcome.   REHAB POTENTIAL: Good  CLINICAL DECISION MAKING: Evolving/moderate complexity  EVALUATION COMPLEXITY: Moderate  PLAN:  PT FREQUENCY: 1-2x/week  PT DURATION: 6 weeks  PLANNED INTERVENTIONS: 97750- Physical Performance Testing, 97110-Therapeutic exercises, 97530- Therapeutic activity, [id number]- Neuromuscular re-education, 97535- Self Care, [id number]- Manual therapy, [id number]- Gait training, [id number]- Orthotic Initial, [id number]- Canalith repositioning, and [id number]- Aquatic Therapy  PLAN FOR NEXT SESSION:  recert vs. DC; Postural strengthening; Check LTGs and discuss POC (next week). Consider discussing community fitness   Louana Fuse [place], [place], DPT 09/11/23 4:14 PM  [facility] at [facility] [address] [place], [address] [place], [place] [address] Phone # [phone] Fax # [phone]

## 2023-09-11 ENCOUNTER — Encounter: Payer: Self-pay | Admitting: Physical Therapy

## 2023-09-11 ENCOUNTER — Ambulatory Visit: Admitting: Physical Therapy

## 2023-09-11 DIAGNOSIS — R2689 Other abnormalities of gait and mobility: Secondary | ICD-10-CM

## 2023-09-11 DIAGNOSIS — M6281 Muscle weakness (generalized): Secondary | ICD-10-CM

## 2023-09-11 DIAGNOSIS — R262 Difficulty in walking, not elsewhere classified: Secondary | ICD-10-CM

## 2023-09-11 DIAGNOSIS — R2681 Unsteadiness on feet: Secondary | ICD-10-CM

## 2023-09-15 ENCOUNTER — Ambulatory Visit

## 2023-09-15 DIAGNOSIS — R2681 Unsteadiness on feet: Secondary | ICD-10-CM

## 2023-09-15 DIAGNOSIS — R262 Difficulty in walking, not elsewhere classified: Secondary | ICD-10-CM

## 2023-09-15 DIAGNOSIS — M6281 Muscle weakness (generalized): Secondary | ICD-10-CM

## 2023-09-15 DIAGNOSIS — R2689 Other abnormalities of gait and mobility: Secondary | ICD-10-CM | POA: Diagnosis not present

## 2023-09-15 NOTE — Therapy (Signed)
 OUTPATIENT PHYSICAL THERAPY NEURO TREATMENT   Patient Name: Anthony [name] MRN: [id number] DOB:1945/10/21, 78 y.o., male Today's Date: 09/15/2023   PCP: Dohmeier, Dedra REFERRING PROVIDER: Natalia Waddell LABOR, PA-C     END OF SESSION:  PT End of Session - 09/15/23 1528     Visit Number 18    Number of Visits 19    Date for PT Re-Evaluation 09/22/23    Authorization Type [employer]    Progress Note Due on Visit 20    PT Start Time 1530    PT Stop Time 1615    PT Time Calculation (min) 45 min    Equipment Utilized During Treatment Gait belt    Activity Tolerance Patient tolerated treatment well    Behavior During Therapy WFL for tasks assessed/performed                   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   Neuromuscular disorder (HCC) 2023   Parkinson's Disease   Neuropathy    left leg greater than right leg   Obesity (BMI 30.0-34.9) 05/14/2013   OSA on CPAP    cpap setting of 10/ 13   Pancreatitis dx march 2016   Pneumonia 12 years ago   Past Surgical History:  Procedure Laterality Date   ANTERIOR CERVICAL DECOMP/DISCECTOMY FUSION N/A 08/02/2022   Procedure: Anterior Cervical Decompression/Discectomy Fusion - Cervical Three-Cervical Four,  Cervical Four-Cervical Five,  remove Cervical Five-Cervical Six Plate;  Surgeon: Joshua Alm Hamilton, MD;  Location: [facility] OR;  Service: Neurosurgery;  Laterality: N/A;  3C   BACK SURGERY  10/2009   Cervical, arterior   CARPAL TUNNEL RELEASE Left 2003    CARPAL TUNNEL RELEASE Bilateral    CATARACT EXTRACTION Bilateral 01/2012   COLONOSCOPY  2024   CORONARY BALLOON ANGIOPLASTY N/A 08/28/2021   Procedure: CORONARY BALLOON ANGIOPLASTY;  Surgeon: Elmira Newman PARAS, MD;  Location: [facility];  Service: Cardiovascular;  Laterality: N/A;   EUS N/A 07/15/2014   Procedure: FULL UPPER ENDOSCOPIC ULTRASOUND (EUS) RADIAL;  Surgeon: Belvie Just, MD;  Location: [facility] END[facility];  Service: Endoscopy;  Laterality: N/A;   LEFT HEART CATH AND CORONARY ANGIOGRAPHY N/A 05/18/2019   Procedure: LEFT HEART CATH AND CORONARY ANGIOGRAPHY;  Surgeon: Ladona Heinz, MD;  Location: [facility];  Service: Cardiovascular;  Laterality: N/A;   LEFT HEART CATH AND CORONARY ANGIOGRAPHY N/A 08/28/2021   Procedure: LEFT HEART CATH AND CORONARY ANGIOGRAPHY;  Surgeon: Elmira Newman PARAS, MD;  Location: [facility];  Service: Cardiovascular;  Laterality: N/A;   NASAL SINUS SURGERY  1981   RETINAL Bilateral 06/2013   Retinal peel   SHOULDER SURGERY Right 2003   TONSILLECTOMY  1954   Patient Active Problem List   Diagnosis Date Noted   Hypotension 08/15/2023   (HFpEF) heart failure with preserved ejection fraction (HCC) 06/24/2023   Mild aortic stenosis  06/24/2023   Hypothyroidism 06/24/2023   Heart failure (HCC) 06/20/2023   Acute right-sided congestive heart failure (HCC) 06/20/2023   Shortness of breath 06/20/2023   Nonrheumatic aortic valve stenosis 02/07/2023   Hypersomnia, persistent 12/26/2022   Nasal congestion 12/26/2022   Post traumatic stress disorder (PTSD) 12/26/2022   S/P cervical spinal fusion 08/02/2022   Parkinsonism (HCC) 12/06/2021   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/06/2021   Hyperkalemia 12/06/2020   CAD (coronary artery disease) 12/03/2019   Esophageal dysphagia 11/21/2019   Gastroesophageal reflux disease 11/21/2019   Family history of colon cancer 11/21/2019   Abnormal stress test 05/11/2019   Mixed  hyperlipidemia 05/11/2019   Angina pectoris (HCC) 08/07/2018   Essential hypertension 08/07/2018   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  Wife wants me to work on posture.  THERAPY DIAG:  Other abnormalities of gait and mobility  Unsteadiness on feet  Muscle weakness (generalized)  Difficulty in walking, not elsewhere classified  Rationale for Evaluation and Treatment: Rehabilitation  SUBJECTIVE:                                                                                                                                                                                             SUBJECTIVE STATEMENT: Walking more and not having episodes of lightheadedness   Pt accompanied by: self  PERTINENT HISTORY: hypertension, hyperlipidemia, CAD, type 2 DM, mild AS, OSA on CPAP, hypothyroidism, Parkinson's disease, GERD.  PAIN:  Are you having pain? No, reports stiffness in L ankle   PRECAUTIONS: Fall  RED FLAGS: None   WEIGHT BEARING RESTRICTIONS: No  FALLS: Has patient fallen in last 6 months? Yes. Number of falls 7  LIVING ENVIRONMENT: Lives with: lives with their family and lives with their spouse Lives in: House/apartment Stairs: Yes, exterior: 4 steps; Ground-floor set-up Has following equipment at home: None  PLOF: Independent with basic ADLs  PATIENT GOALS: improve balance  OBJECTIVE:    TODAY'S TREATMENT: 09/15/23 Activity Comments  Vitals: 119/76 mmHg, 73 bpm,    TM walking x 8 min 2 min at 2 mph warm-up 6 min at 2.6  mph Rates 6/10 RPE Good self-correction to foot clearance with auditory cues  Vitals: 172/90 mmHg, 108 bpm, 97%   Seated and standing PWR moves 1x10 90% recall with verbal/visual cues for reference/correction  Seated tai chi for large amplitude   Static multisensory balance Instances of LOB with compliant surface and head movements requiring min-mod A to correct     TODAY'S TREATMENT: 09/11/23 Activity Comments  palpation Mild TTP over anterolateral ankle   TM walking B UE support 6 min at 2.5 mph, 1 min cooldown 1.6 mph Dynamic warm up; cueing to achieve moderate intensity. Tendency to drag heels- cueing to correct  Tolerated without ankle pain   review of recent HEP update:  seated PWR moves: up rock twist step Cueing to avoid posterior lean into back rest. Coordinate opposite UE/LE, maintain distinct movements   review and added  to HEP:  standing PWR moves: up rock twist step Demo provided. Patient performed with great technique   Review of sitting PWR rock d/t therapist's error  Pt performed well         PATIENT EDUCATION: Education details: edu and handout on PD community exercise classes; HEP update with edu for safety Person educated: Patient Education method: Explanation, Demonstration, Tactile cues, Verbal cues, and Handouts Education comprehension: verbalized understanding and returned demonstration    HOME EXERCISE PROGRAM: Access Code: [email or web address] URL: [email or web address] Date: 07/16/2023>09/08/2023 Prepared by: [facility] Outpatient  Re[facility] - [facility]  SEATED PWR! MOVES, 10 reps 3-5x/week STANDING PWR! MOVES AT COUNTER TOP, 10 reps 3-5x/week  Exercises - Sitting Knee Extension with Resistance  - 1 x daily - 5 x weekly - 3 sets - 10 reps - Seated March with Resistance  - 1 x daily - 5 x weekly - 3 sets - 10 reps - Seated Hip Abduction with Resistance  - 1 x daily - 5 x weekly - 3 sets - 10 reps - Seated Hamstring Curls with  Resistance  - 1 x daily - 5 x weekly - 3 sets - 10 reps - Mini Squat with Counter Support  - 1 x daily - 7 x weekly - 2-3 sets - 10 reps - Feet Together Balance at [employer] Eyes Closed  - 1 x daily - 7 x weekly - 3 sets - 10 sec hold - Corner Balance Feet Together: Eyes Closed With Head Turns  - 1 x daily - 7 x weekly - 3 sets - 30 sec hold   PATIENT EDUCATION: Education details: 09/08/2023-Addition to HEP for PWR! Moves sitting; postural awareness with standing and gait; provided information on PD symposium Person educated: Patient and Spouse Education method: Explanation, Demonstration, Verbal cues, and Handouts Education comprehension: verbalized understanding, returned demonstration, and needs further education    Note: Objective measures were completed at Evaluation unless otherwise noted.  DIAGNOSTIC FINDINGS:   Vitals: 107/67 mmHg, 91 bpm  COGNITION: Overall cognitive status: Within functional limits for tasks assessed   SENSATION: Not tested, reports hx of neuropathy in fingers  COORDINATION: Difficulty to rapid alternating movement Heel to shin WNL  EDEMA:  None at present, currently daily weights for CHF  MUSCLE TONE: no hypertonicity noted  MUSCLE LENGTH: WNL  DTRs:  NT  POSTURE: rounded shoulders and forward head  LOWER EXTREMITY ROM:     Active  Right Eval Left Eval  Hip flexion    Hip extension    Hip abduction    Hip adduction    Hip internal rotation    Hip external rotation    Knee flexion    Knee extension  Ankle dorsiflexion    Ankle plantarflexion    Ankle inversion    Ankle eversion     (Blank rows = not tested)  LOWER EXTREMITY MMT:    MMT Right Eval Left Eval  Hip flexion    Hip extension    Hip abduction    Hip adduction    Hip internal rotation    Hip external rotation    Knee flexion    Knee extension    Ankle dorsiflexion    Ankle plantarflexion    Ankle inversion    Ankle eversion    (Blank rows = not  tested)  BED MOBILITY:  Independent  TRANSFERS: Independent with sit-stand and chair-chair Floor to stand: reports need for physical assistance after falling or using furniture if intentionally getting on to ground and up again  STAIRS: TBD GAIT: Findings: Comments: ambulates independently level surfaces, no AD in past  FUNCTIONAL TESTS:  Mini-BESTest: TBD Berg Balance Test: 45/56 : TBD  PATIENT SURVEYS:  Freezing of gait questionnaire TBD                                                                                                                              TREATMENT DATE:     PATIENT EDUCATION: Education details: assessment details Person educated: Patient and Spouse Education method: Explanation Education comprehension: verbalized understanding  HOME EXERCISE PROGRAM: TBD  GOALS: Goals reviewed with patient? Yes  SHORT TERM GOALS: Target date: 07/21/2023    Patient will be independent in HEP to improve functional outcomes Baseline: Goal status: MET  2.  Demo improved BLE strength and balance per time < 15 sec 5xSTS test Baseline: 20 sec; 10 sec Goal status: MET  3.  Demo improved static balance and reduced risk for falls per score 50/56 Berg Balance Test Baseline: 45/56; 49/56 Goal status: IN PROGRESS 08/04/23    LONG TERM GOALS: Target date: 09/22/2023      Demo improved mobility and reduced risk for falls per score 24/28 Mini-BESTest Baseline: 15/28; 19/28 Goal status: IN PROGRESS 08/11/23  2.  Demo reduced risk for falls per time 4.8 sec Baseline: 8.05 sec; 7.31 sec Goal status: IN PROGRESS 08/11/23  3.  Patient to ambulate 1100 feet without AD during without lightheadedness.  Baseline: 795 feet without AD. Required sit break at 5 min 30 sec 07/07/23; 778 ft in 6 MWT with rollator, no rest break Goal status: IN PROGRESS 07/07/23  4.  Teach back relevant programs/activities for fitness/exercise for those w/ PD to improve  carryover at D/C Baseline:  Goal status: IN PROGRESS   ASSESSMENT:  CLINICAL IMPRESSION: Good tolerance to increase speed/time for treadmill training w/ CGA for safety throughout. Review to seated and standing large amplitude movenments with emphasis on teach-back demo 90% recall to movements w/ verbal/visual cues for sequence/correction. Demo of seated tai chi movements for large amplitude movement and coordination w/ good tolerance and pt expresses interest in trying community  class for this. Static multisensory balance to improve postural control and proprioceptive awareness with instance of LOB with compliant surface and head movement conditions. Continued sessions for wrap-up and D/C assessment to prepare for HEP and community classes.   OBJECTIVE IMPAIRMENTS: Abnormal gait, decreased activity tolerance, decreased balance, decreased coordination, decreased endurance, decreased knowledge of use of DME, difficulty walking, decreased strength, dizziness, and postural dysfunction.   ACTIVITY LIMITATIONS: carrying, lifting, bending, squatting, transfers, reach over head, and locomotion level  PARTICIPATION LIMITATIONS: meal prep, cleaning, laundry, interpersonal relationship, shopping, community activity, and yard work  PERSONAL FACTORS: Age, Time since onset of injury/illness/exacerbation, and 3+ comorbidities: PMH are also affecting patient's functional outcome.   REHAB POTENTIAL: Good  CLINICAL DECISION MAKING: Evolving/moderate complexity  EVALUATION COMPLEXITY: Moderate  PLAN:  PT FREQUENCY: 1-2x/week  PT DURATION: 6 weeks  PLANNED INTERVENTIONS: 97750- Physical Performance Testing, 97110-Therapeutic exercises, 97530- Therapeutic activity, [id number]- Neuromuscular re-education, 97535- Self Care, [id number]- Manual therapy, [id number]- Gait training, [id number]- Orthotic Initial, [id number]- Canalith repositioning, and [id number]- Aquatic Therapy  PLAN FOR NEXT SESSION:  recert vs. DC; Postural strengthening;  Check LTGs and discuss POC (next week). Consider discussing community fitness   4:18 PM, 09/15/23 M. Kelly Ewelina Naves, PT, DPT Physical Therapist- [place] Office Number: [phone]

## 2023-09-16 DIAGNOSIS — E1165 Type 2 diabetes mellitus with hyperglycemia: Secondary | ICD-10-CM | POA: Diagnosis not present

## 2023-09-19 ENCOUNTER — Ambulatory Visit

## 2023-09-19 DIAGNOSIS — R2689 Other abnormalities of gait and mobility: Secondary | ICD-10-CM

## 2023-09-19 DIAGNOSIS — M6281 Muscle weakness (generalized): Secondary | ICD-10-CM

## 2023-09-19 DIAGNOSIS — R2681 Unsteadiness on feet: Secondary | ICD-10-CM

## 2023-09-19 DIAGNOSIS — R262 Difficulty in walking, not elsewhere classified: Secondary | ICD-10-CM

## 2023-09-19 NOTE — Therapy (Signed)
 OUTPATIENT PHYSICAL THERAPY NEURO TREATMENT and D/C Summary   Patient Name: Anthony [name] MRN: [id number] DOB:05/09/1945, 78 y.o., male Today's Date: 09/19/2023   PCP: Dohmeier, Dedra REFERRING PROVIDER: Natalia Waddell LABOR, PA-C   PHYSICAL THERAPY DISCHARGE SUMMARY  Visits from Start of Care: 19  Current functional level related to goals / functional outcomes: Goals met   Remaining deficits: Difficulty with single leg stance   Education / Equipment: HEP and recommendations of community exercises classes for those w/ PD   Patient agrees to discharge. Patient goals were met. Patient is being discharged due to meeting the stated rehab goals.   END OF SESSION:  PT End of Session - 09/19/23 0848     Visit Number 19    Number of Visits 19    Date for PT Re-Evaluation 09/22/23    Authorization Type [employer]    Progress Note Due on Visit 20    PT Start Time 0845    PT Stop Time 0930    PT Time Calculation (min) 45 min    Equipment Utilized During Treatment Gait belt    Activity Tolerance Patient tolerated treatment well    Behavior During Therapy WFL for tasks assessed/performed                   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   Neuromuscular disorder (HCC) 2023   Parkinson's Disease   Neuropathy    left leg greater than right leg   Obesity (BMI 30.0-34.9) 05/14/2013   OSA on CPAP    cpap setting of 10/ 13   Pancreatitis dx march 2016   Pneumonia 12 years ago   Past Surgical History:   Procedure Laterality Date   ANTERIOR CERVICAL DECOMP/DISCECTOMY FUSION N/A 08/02/2022   Procedure: Anterior Cervical Decompression/Discectomy Fusion - Cervical Three-Cervical Four,  Cervical Four-Cervical Five,  remove Cervical Five-Cervical Six Plate;  Surgeon: Joshua Alm Hamilton, MD;  Location: [facility] OR;  Service: Neurosurgery;  Laterality: N/A;  3C   BACK SURGERY  10/2009   Cervical, arterior   CARPAL TUNNEL RELEASE Left 2003   CARPAL TUNNEL RELEASE Bilateral    CATARACT EXTRACTION Bilateral 01/2012   COLONOSCOPY  2024   CORONARY BALLOON ANGIOPLASTY N/A 08/28/2021   Procedure: CORONARY BALLOON ANGIOPLASTY;  Surgeon: Elmira Newman PARAS, MD;  Location: [facility];  Service: Cardiovascular;  Laterality: N/A;   EUS N/A 07/15/2014   Procedure: FULL UPPER ENDOSCOPIC ULTRASOUND (EUS) RADIAL;  Surgeon: Belvie Just, MD;  Location: [facility];  Service: Endoscopy;  Laterality: N/A;   LEFT HEART CATH AND CORONARY ANGIOGRAPHY N/A 05/18/2019   Procedure: LEFT HEART CATH AND CORONARY ANGIOGRAPHY;  Surgeon: Ladona Heinz, MD;  Location: [facility];  Service: Cardiovascular;  Laterality: N/A;   LEFT HEART CATH AND CORONARY ANGIOGRAPHY N/A 08/28/2021   Procedure: LEFT HEART CATH AND CORONARY ANGIOGRAPHY;  Surgeon: Elmira Newman  J, MD;  Location: [facility];  Service: Cardiovascular;  Laterality: N/A;   NASAL SINUS SURGERY  1981   RETINAL Bilateral 06/2013   Retinal peel   SHOULDER SURGERY Right 2003   TONSILLECTOMY  1954   Patient Active Problem List   Diagnosis Date Noted   Hypotension 08/15/2023   (HFpEF) heart failure with preserved ejection fraction (HCC) 06/24/2023   Mild aortic stenosis 06/24/2023   Hypothyroidism 06/24/2023   Heart failure (HCC) 06/20/2023   Acute right-sided congestive heart failure (HCC) 06/20/2023   Shortness of breath 06/20/2023   Nonrheumatic aortic valve stenosis 02/07/2023   Hypersomnia, persistent 12/26/2022   Nasal congestion  12/26/2022   Post traumatic stress disorder (PTSD) 12/26/2022   S/P cervical spinal fusion 08/02/2022   Parkinsonism (HCC) 12/06/2021   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/06/2021   Hyperkalemia 12/06/2020   CAD (coronary artery disease) 12/03/2019   Esophageal dysphagia 11/21/2019   Gastroesophageal reflux disease 11/21/2019   Family history of colon cancer 11/21/2019   Abnormal stress test 05/11/2019   Mixed hyperlipidemia 05/11/2019   Angina pectoris (HCC) 08/07/2018   Essential hypertension 08/07/2018   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  Wife wants me to work on posture.  THERAPY DIAG:  Other abnormalities of gait and mobility  Unsteadiness on feet  Muscle weakness (generalized)  Difficulty in walking, not elsewhere classified  Rationale for Evaluation and Treatment: Rehabilitation  SUBJECTIVE:                                                                                                                                                                                             SUBJECTIVE STATEMENT: Doing well, no lightheadedness   Pt accompanied by: self  PERTINENT HISTORY: hypertension, hyperlipidemia, CAD, type 2 DM, mild AS, OSA on CPAP, hypothyroidism, Parkinson's disease, GERD.  PAIN:  Are you having pain? No, reports stiffness in L ankle   PRECAUTIONS: Fall  RED FLAGS: None   WEIGHT BEARING RESTRICTIONS: No  FALLS: Has patient fallen in last 6 months? Yes. Number of falls 7  LIVING  ENVIRONMENT: Lives with: lives with their family and lives with their spouse Lives in: House/apartment Stairs: Yes,  exterior: 4 steps; Ground-floor set-up Has following equipment at home: None  PLOF: Independent with basic ADLs  PATIENT GOALS: improve balance  OBJECTIVE:   TODAY'S TREATMENT: 09/19/23 Activity Comments  POC performance/review                     [facility] PT Assessment - 09/19/23 0001       6 minute walk test results    Aerobic Endurance Distance Walked 1300    Endurance additional comments no SOB, no rest periods      [employer]   Sit to Stand Able to stand without using hands and stabilize independently    Standing Unsupported Able to stand safely 2 minutes    Sitting with Back Unsupported but Feet Supported on Floor or Stool Able to sit safely and securely 2 minutes    Stand to Sit Sits safely with minimal use of hands    Transfers Able to transfer safely, minor use of hands    Standing Unsupported with Eyes Closed Able to stand 10 seconds safely    Standing Unsupported with Feet Together Able to place feet together independently and stand 1 minute safely    From Standing, Reach Forward with Outstretched Arm Can reach confidently >25 cm (10)    From Standing Position, Pick up Object from Floor Able to pick up shoe safely and easily    From Standing Position, Turn to Look Behind Over each Shoulder Looks behind from both sides and weight shifts well    Turn 360 Degrees Able to turn 360 degrees safely one side only in 4 seconds or less    Standing Unsupported, Alternately Place Feet on Step/Stool Able to stand independently and safely and complete 8 steps in 20 seconds    Standing Unsupported, One Foot in Front Able to plae foot ahead of the other independently and hold 30 seconds    Standing on One Leg Able to lift leg independently and hold 5-10 seconds    Total Score 53      Mini-BESTest   Sit To Stand Normal: Comes to stand without use of hands and  stabilizes independently.    Rise to Toes Normal: Stable for 3 s with maximum height.    Stand on one leg (left) Moderate: < 20 s    Stand on one leg (right) Moderate: < 20 s    Stand on one leg - lowest score 1    Compensatory Stepping Correction - Forward Normal: Recovers independently with a single, large step (second realignement is allowed).    Compensatory Stepping Correction - Backward Normal: Recovers independently with a single, large step    Compensatory Stepping Correction - Left Lateral Normal: Recovers independently with 1 step (crossover or lateral OK)    Compensatory Stepping Correction - Right Lateral Normal: Recovers independently with 1 step (crossover or lateral OK)    Stepping Corredtion Lateral - lowest score 2    Stance - Feet together, eyes open, firm surface  Normal: 30s    Stance - Feet together, eyes closed, foam surface  Normal: 30s    Incline - Eyes Closed Normal: Stands independently 30s and aligns with gravity    Change in Gait Speed Normal: Significantly changes walkling speed without imbalance    Walk with head turns - Horizontal Normal: performs head turns with no change in gait speed and good balance    Walk with pivot turns Normal: Turns with feet close FAST (< 3 steps) with good  balance.    Step over obstacles Normal: Able to step over box with minimal change of gait speed and with good balance.    Timed UP & GO with Dual Task Severe: Stops counting while walking OR stops walking while counting.   TUG reg: 6.8 sec; TUG cog:   Mini-BEST total score 25               PATIENT EDUCATION: Education details: edu and handout on PD community exercise classes; HEP update with edu for safety Person educated: Patient Education method: Explanation, Demonstration, Tactile cues, Verbal cues, and Handouts Education comprehension: verbalized understanding and returned demonstration    HOME EXERCISE PROGRAM: Access Code: [email or web address] URL:  [email or web address] Date: 07/16/2023>09/08/2023 Prepared by: [facility] Outpatient  Re[facility] - [facility]  SEATED PWR! MOVES, 10 reps 3-5x/week STANDING PWR! MOVES AT COUNTER TOP, 10 reps 3-5x/week  Exercises - Sitting Knee Extension with Resistance  - 1 x daily - 5 x weekly - 3 sets - 10 reps - Seated March with Resistance  - 1 x daily - 5 x weekly - 3 sets - 10 reps - Seated Hip Abduction with Resistance  - 1 x daily - 5 x weekly - 3 sets - 10 reps - Seated Hamstring Curls with Resistance  - 1 x daily - 5 x weekly - 3 sets - 10 reps - Mini Squat with Counter Support  - 1 x daily - 7 x weekly - 2-3 sets - 10 reps - Feet Together Balance at [employer] Eyes Closed  - 1 x daily - 7 x weekly - 3 sets - 10 sec hold - Corner Balance Feet Together: Eyes Closed With Head Turns  - 1 x daily - 7 x weekly - 3 sets - 30 sec hold   PATIENT EDUCATION: Education details: 09/08/2023-Addition to HEP for PWR! Moves sitting; postural awareness with standing and gait; provided information on PD symposium Person educated: Patient and Spouse Education method: Explanation, Demonstration, Verbal cues, and Handouts Education comprehension: verbalized understanding, returned demonstration, and needs further education    Note: Objective measures were completed at Evaluation unless otherwise noted.  DIAGNOSTIC FINDINGS:   Vitals: 107/67 mmHg, 91 bpm  COGNITION: Overall cognitive status: Within functional limits for tasks assessed   SENSATION: Not tested, reports hx of neuropathy in fingers  COORDINATION: Difficulty to rapid alternating movement Heel to shin WNL  EDEMA:  None at present, currently daily weights for CHF  MUSCLE TONE: no hypertonicity noted  MUSCLE LENGTH: WNL  DTRs:  NT  POSTURE: rounded shoulders and forward head  LOWER EXTREMITY ROM:     Active  Right Eval Left Eval  Hip flexion    Hip extension    Hip abduction    Hip adduction    Hip internal  rotation    Hip external rotation    Knee flexion    Knee extension    Ankle dorsiflexion    Ankle plantarflexion    Ankle inversion    Ankle eversion     (Blank rows = not tested)  LOWER EXTREMITY MMT:    MMT Right Eval Left Eval  Hip flexion    Hip extension    Hip abduction    Hip adduction    Hip internal rotation    Hip external rotation    Knee flexion    Knee extension    Ankle dorsiflexion    Ankle plantarflexion    Ankle inversion    Ankle eversion    (  Blank rows = not tested)  BED MOBILITY:  Independent  TRANSFERS: Independent with sit-stand and chair-chair Floor to stand: reports need for physical assistance after falling or using furniture if intentionally getting on to ground and up again  STAIRS: TBD GAIT: Findings: Comments: ambulates independently level surfaces, no AD in past  FUNCTIONAL TESTS:  Mini-BESTest: TBD Berg Balance Test: 45/56 : TBD  PATIENT SURVEYS:  Freezing of gait questionnaire TBD                                                                                                                              TREATMENT DATE:     PATIENT EDUCATION: Education details: assessment details Person educated: Patient and Spouse Education method: Explanation Education comprehension: verbalized understanding  HOME EXERCISE PROGRAM: TBD  GOALS: Goals reviewed with patient? Yes  SHORT TERM GOALS: Target date: 07/21/2023    Patient will be independent in HEP to improve functional outcomes Baseline: Goal status: MET  2.  Demo improved BLE strength and balance per time < 15 sec 5xSTS test Baseline: 20 sec; 10 sec Goal status: MET  3.  Demo improved static balance and reduced risk for falls per score 50/56 Berg Balance Test Baseline: 45/56; 49/56; 53/56 Goal status: MET    LONG TERM GOALS: Target date: 09/22/2023      Demo improved mobility and reduced risk for falls per score 24/28 Mini-BESTest Baseline: 15/28;  19/28; 25/28 Goal status: MET  2.  Demo reduced risk for falls per time 4.8 sec Baseline: 8.05 sec; 7.31 sec; 7.4 sec Goal status: NOT MET  3.  Patient to ambulate 1100 feet without AD during without lightheadedness.  Baseline: 795 feet without AD. Required sit break at 5 min 30 sec 07/07/23; 778 ft in 6 MWT;  with rollator, no rest break; 1300 no AD, no rest periods Goal status: MET  4.  Teach back relevant programs/activities for fitness/exercise for those w/ PD to improve carryover at D/C Baseline:  Goal status: MET   ASSESSMENT:  CLINICAL IMPRESSION: Performance and review of POC details with improved score Mini-BESTest to 25/28 indicating low risk for falls. Berg Balance Test [phone] indicating low risk for falls with difficulty performing single leg stance in both tests.  Improved endurance per traveling 1300 ft w/out AD and no SOB or abnormal response.  Gait speed 3.9 ft/sec.  Difficulty with 3 meter backwards walk test requiring 7.4 sec which indicates increased risk for falls for time > 4.8 sec.  Pt intends to initiate routine walking program and community tai chi class for activity maintenance.  Pt pleased with current functional status and will f/u w/ mobility screen in 6 months.  D/C to HEP at this time.   OBJECTIVE IMPAIRMENTS: Abnormal gait, decreased activity tolerance, decreased balance, decreased coordination, decreased endurance, decreased knowledge of use of DME, difficulty walking, decreased strength, dizziness, and postural dysfunction.   ACTIVITY LIMITATIONS: carrying, lifting, bending, squatting, transfers, reach  over head, and locomotion level  PARTICIPATION LIMITATIONS: meal prep, cleaning, laundry, interpersonal relationship, shopping, community activity, and yard work  PERSONAL FACTORS: Age, Time since onset of injury/illness/exacerbation, and 3+ comorbidities: PMH are also affecting patient's functional outcome.   REHAB POTENTIAL: Good  CLINICAL  DECISION MAKING: Evolving/moderate complexity  EVALUATION COMPLEXITY: Moderate  PLAN:  PT FREQUENCY: 1-2x/week  PT DURATION: 6 weeks  PLANNED INTERVENTIONS: 97750- Physical Performance Testing, 97110-Therapeutic exercises, 97530- Therapeutic activity, [id number]- Neuromuscular re-education, 97535- Self Care, [id number]- Manual therapy, [id number]- Gait training, [phone]- Orthotic Initial, [id number]- Canalith repositioning, and [id number]- Aquatic Therapy  PLAN FOR NEXT SESSION:  D/C to HEP   8:49 AM, 09/19/23 M. Kelly Quintasha Gren, PT, DPT Physical Therapist- [place] Office Number: [phone]

## 2023-09-24 ENCOUNTER — Ambulatory Visit (HOSPITAL_COMMUNITY)
Admission: RE | Admit: 2023-09-24 | Discharge: 2023-09-24 | Disposition: A | Discharge status: Home / Self Care | Source: Ambulatory Visit | Attending: Cardiology | Admitting: Cardiology

## 2023-09-24 DIAGNOSIS — I50812 Chronic right heart failure: Secondary | ICD-10-CM | POA: Diagnosis not present

## 2023-09-24 LAB — ECHOCARDIOGRAM COMPLETE
AR max vel: 1.59 cm2
AV Area VTI: 1.36 cm2
AV Area mean vel: 1.53 cm2
AV Mean grad: 9.8 mmHg
AV Peak grad: 17.2 mmHg
Ao pk vel: 2.07 m/s
S' Lateral: 2.7 cm

## 2023-09-25 LAB — LAB REPORT - SCANNED: EGFR: 89

## 2023-09-25 NOTE — Progress Notes (Unsigned)
 Cardiology Office Note:  .   Date:  09/25/2023  ID:  Anthony [name], DOB January 04, 1946, MRN [id number] PCP: Vernadine Charlie ORN, MD  [facility] Providers Cardiologist:  Newman Lawrence, MD PCP: Vernadine Charlie ORN, MD  No chief complaint on file.     History of Present Illness: .    Anthony [name] is a 78 y.o. male with hypertension, hyperlipidemia, CAD, type 2 DM, mild AS, OSA on CPAP, hypothyroidism, Parkinson's disease, GERD   After his last visit with me on 06/20/2023, I had admitted to the hospital for acute on chronic HFpEF.  It appears that he was discharged home on Jardiance  25 mg daily, Imdur  30 mg daily, losartan  25 mg daily, Lasix  80 mg twice daily.  Patient was seen in the office on 07/08/2023 and subsequently hospitalized with near syncope, with reduction in Imdur  dose, and subsequently stoppage of Jardiance  and Lasix .  Patient is here today with his wife.  His blood pressure remains low.  However, he is feeling much better.  Lightheadedness has resolved.  He has not had any gait imbalance issues.  Leg edema persists.  There were no vitals filed for this visit.      ROS:  Review of Systems  Cardiovascular:  Positive for leg swelling and orthopnea. Negative for chest pain, dyspnea on exertion, palpitations and syncope.     Studies Reviewed: SABRA        EKG 06/20/2023: Sinus bradycardia Rightward axis Possible Inferior infarct , age undetermined When compared with ECG of 28-Aug-2021 11:57, Borderline criteria for Inferior infarct are now Present Non-specific change in ST segment in Inferior leads T wave inversion now evident in Inferior leads T wave inversion now evident in Lateral leads    Echocardiogram 09/2023:  1. Left ventricular ejection fraction, by estimation, is 65 to 70%. The left ventricle has normal function. The left ventricle has no regional wall motion abnormalities. There is mild left ventricular hypertrophy. Left ventricular  diastolic parameters  were normal. The average left ventricular global longitudinal strain is -26.1 %. The global longitudinal strain is normal.  2. Right ventricular systolic function is mildly reduced. The right ventricular size is normal. Tricuspid regurgitation signal is inadequate for assessing PA pressure.  3. The mitral valve is degenerative. Trivial mitral valve regurgitation. No evidence of mitral stenosis.  4. The aortic valve is abnormal. There is severe calcifcation of the aortic valve. Aortic valve regurgitation is not visualized. Mild aortic valve stenosis. Aortic valve area, by VTI measures 1.36 cm. Aortic valve mean gradient measures 9.8 mmHg.  Aortic valve Vmax measures 2.07 m/s.  5. The inferior vena cava is dilated in size with <50% respiratory variability, suggesting right atrial pressure of 15 mmHg.    Comparison(s): A prior study was performed on 02/26/2023. Reported LVEF 60  to 65%, grade 1 diastolic dysfunction, average GLS -26.6%, mild aortic  stenosis, estimated RAP 15 mmHg.    Labs 4-05/2023: HbA1C 6.0%  09/2022: Chol 141, TG 167, HDL 35, LDL 77 HbA1C 6.0% Hb 13.5 Cr 0.9  Physical Exam:   Physical Exam Vitals and nursing note reviewed.  Constitutional:      General: He is not in acute distress. Neck:     Vascular: No JVD.  Cardiovascular:     Rate and Rhythm: Normal rate and regular rhythm.     Heart sounds: Murmur heard.     Harsh midsystolic murmur is present with a grade of 2/6 at the upper right sternal border radiating to  the neck.  Pulmonary:     Effort: Pulmonary effort is normal.     Breath sounds: Normal breath sounds. No wheezing or rales.  Abdominal:     Comments: Distension  Musculoskeletal:     Right lower leg: Edema (1+) present.     Left lower leg: Edema (1+) present.      VISIT DIAGNOSES: No diagnosis found.      ASSESSMENT AND PLAN: .    Anthony [name] is a 78 y.o. male with hypertension, hyperlipidemia, mod  nonobstructive CAD, type 2 DM, OSA on CPAP, hypothyroidism, GERD, HFpEF  HFpEF: Hyperdynamic LV function, with mildly reduced RV function on echocardiogram 06/2023. Symptoms were consistent with right-sided heart failure. He did not tolerate diuretics including Lasix  and Jardiance , as well as losartan  due to significant hypotension. I do think right heart failure, as well as component of autonomic dysfunction may have contributed to his original symptoms of hypotension in the setting of medication use. Recommend staying off all of the above medications.  Recommend wearing compression stockings regularly.  Keep legs elevated at night. Repeat echocardiogram next month.    CAD: No anginal symptoms. Given low normal blood pressure, stop Imdur  50 mg daily. Continue aspirin , statin.   Type 2 diabetes mellitus: Continue follow-up with PCP.   Presyncope/fall: Suspect combination of Parkinson's disease, hypertension, question neuropathy.  Hypertension: Controlled   Parkinson's disease: Continue baseline Parkinson's medications.  F/u in 6 weeks, after echocardiogram  Signed, Newman JINNY Lawrence, MD

## 2023-09-26 ENCOUNTER — Encounter: Payer: Self-pay | Admitting: Cardiology

## 2023-09-26 ENCOUNTER — Other Ambulatory Visit (HOSPITAL_COMMUNITY): Payer: Self-pay

## 2023-09-26 ENCOUNTER — Other Ambulatory Visit: Payer: Self-pay | Admitting: *Deleted

## 2023-09-26 ENCOUNTER — Ambulatory Visit: Attending: Cardiology | Admitting: Cardiology

## 2023-09-26 VITALS — BP 142/82 | HR 73 | Resp 16 | Ht 70.0 in | Wt 212.0 lb

## 2023-09-26 DIAGNOSIS — I50813 Acute on chronic right heart failure: Secondary | ICD-10-CM

## 2023-09-26 DIAGNOSIS — I350 Nonrheumatic aortic (valve) stenosis: Secondary | ICD-10-CM | POA: Diagnosis not present

## 2023-09-26 DIAGNOSIS — I10 Essential (primary) hypertension: Secondary | ICD-10-CM | POA: Diagnosis not present

## 2023-09-26 MED ORDER — FUROSEMIDE 40 MG PO TABS
40.0000 mg | ORAL_TABLET | Freq: Every day | ORAL | 2 refills | Status: AC
  Filled 2023-09-26: qty 30, 30d supply, fill #0
  Filled 2023-09-26: qty 90, 90d supply, fill #0
  Filled 2023-10-22: qty 30, 30d supply, fill #1

## 2023-09-26 NOTE — Patient Instructions (Addendum)
 Medication Instructions:  START Lasix  40 mg daily *If you need a refill on your cardiac medications before your next appointment, please call your pharmacy*  Lab Work IN 1 WEEK: CBC ProBNP BMP  If you have labs (blood work) drawn today and your tests are completely normal, you will receive your results only by: MyChart Message (if you have MyChart) OR A paper copy in the mail If you have any lab test that is abnormal or we need to change your treatment, we will call you to review the results.  Testing/Procedures: Right Heart Catheterization Your physician has requested that you have a cardiac catheterization. Cardiac catheterization is used to diagnose and/or treat various heart conditions. Doctors may recommend this procedure for a number of different reasons. The most common reason is to evaluate chest pain. Chest pain can be a symptom of coronary artery disease (CAD), and cardiac catheterization can show whether plaque is narrowing or blocking your heart's arteries. This procedure is also used to evaluate the valves, as well as measure the blood flow and oxygen levels in different parts of your heart. For further information please visit [email or web address] Please follow instruction sheet, as given.   V/Q Scan    Follow-Up: At [facility], you and your health needs are our priority.  As part of our continuing mission to provide you with exceptional heart care, our providers are all part of one team.  This team includes your primary Cardiologist (physician) and Advanced Practice Providers or APPs (Physician Assistants and Nurse Practitioners) who all work together to provide you with the care you need, when you need it.  Your next appointment:   After Right Heart Catheterization   Provider:   Newman JINNY Lawrence, MD    We recommend signing up for the patient portal called MyChart.  Sign up information is provided on this After Visit Summary.  MyChart is used to connect  with patients for Virtual Visits (Telemedicine).  Patients are able to view lab/test results, encounter notes, upcoming appointments, etc.  Non-urgent messages can be sent to your provider as well.   To learn more about what you can do with MyChart, go to [email or web address].   Other Instructions    [facility] A DEPT OF [place] AT [address] A DEPT OF THE [place] [address] [place] [address] Dept: [phone] Loc: [phone]  Rahman Ferrall  09/26/2023  You are scheduled for a Cardiac Catheterization on Thursday, September 18 with Dr. Newman Lawrence.  1. Please arrive at the [facility] (Main Entrance A) at [facility]: [address] [place], [place] [address] at 7:00 AM (This time is 2 hour(s) before your procedure to ensure your preparation).   Free valet parking service is available. You will check in at ADMITTING. The support person will be asked to wait in the waiting room.  It is OK to have someone drop you off and come back when you are ready to be discharged.    Special note: Every effort is made to have your procedure done on time. Please understand that emergencies sometimes delay scheduled procedures.  2. Diet: Nothing to eat after midnight.   3. Hydration: You need to be well hydrated before your procedure. On September 18, you may drink approved liquids (see below) until 2 hours before the procedure, with 16 oz of water as your last intake.   List of approved liquids water, clear juice, clear  tea, black coffee, fruit juices, non-citric and without pulp, carbonated beverages, Gatorade, Kool -Aid, plain Jello-O and plain ice popsicles.  4. Labs: You will need to have blood drawn on Friday, September 12 at [facility] (1st Floor), [address], [place], [place] [address]. You do not need to be fasting.  5. Medication instructions in  preparation for your procedure:   Contrast Allergy: No   Hold your Lasix  the morning of this procedure  Take only 7 units of insulin  the night before your procedure. Do not take any insulin  on the day of the procedure.  Hold Jardiance  for 3 days prior to this procedure, take your last dose on Sunday 9/14.   Hold your morning medications the morning of this procedure.  On the morning of your procedure, take your Aspirin  81 mg and any morning medicines NOT listed above.  You may use sips of water.  6. Plan to go home the same day, you will only stay overnight if medically necessary. 7. Bring a current list of your medications and current insurance cards. 8. You MUST have a responsible person to drive you home. 9. Someone MUST be with you the first 24 hours after you arrive home or your discharge will be delayed. 10. Please wear clothes that are easy to get on and off and wear slip-on shoes.  Thank you for allowing us  to care for you!   -- [facility]

## 2023-10-01 ENCOUNTER — Ambulatory Visit (HOSPITAL_COMMUNITY)

## 2023-10-03 ENCOUNTER — Ambulatory Visit (HOSPITAL_COMMUNITY)
Admission: RE | Admit: 2023-10-03 | Discharge: 2023-10-03 | Disposition: A | Discharge status: Home / Self Care | Source: Ambulatory Visit | Attending: Cardiology | Admitting: Cardiology

## 2023-10-03 DIAGNOSIS — I50813 Acute on chronic right heart failure: Secondary | ICD-10-CM | POA: Insufficient documentation

## 2023-10-03 DIAGNOSIS — J90 Pleural effusion, not elsewhere classified: Secondary | ICD-10-CM | POA: Diagnosis not present

## 2023-10-04 ENCOUNTER — Ambulatory Visit: Payer: Self-pay | Admitting: Cardiology

## 2023-10-04 LAB — BASIC METABOLIC PANEL WITH GFR
BUN/Creatinine Ratio: 12 (ref 10–24)
BUN: 12 mg/dL (ref 8–27)
CO2: 23 mmol/L (ref 20–29)
Calcium: 9.7 mg/dL (ref 8.6–10.2)
Chloride: 97 mmol/L (ref 96–106)
Creatinine, Ser: 1.03 mg/dL (ref 0.76–1.27)
Glucose: 140 mg/dL — AB (ref 70–99)
Potassium: 4.2 mmol/L (ref 3.5–5.2)
Sodium: 139 mmol/L (ref 134–144)
eGFR: 75 mL/min/1.73 (ref >59)

## 2023-10-04 LAB — CBC
Hematocrit: 44.2 % (ref 37.5–51.0)
Hemoglobin: 13.8 g/dL (ref 13.0–17.7)
MCH: 28.6 pg (ref 26.6–33.0)
MCHC: 31.2 g/dL — ABNORMAL LOW (ref 31.5–35.7)
MCV: 92 fL (ref 79–97)
Platelets: 217 x10E3/uL (ref 150–450)
RBC: 4.82 x10E6/uL (ref 4.14–5.80)
RDW: 16.9 % — ABNORMAL HIGH (ref 11.6–15.4)
WBC: 7.9 x10E3/uL (ref 3.4–10.8)

## 2023-10-04 LAB — PRO B NATRIURETIC PEPTIDE: NT-Pro BNP: 209 pg/mL (ref 0–486)

## 2023-10-06 ENCOUNTER — Encounter (HOSPITAL_COMMUNITY): Payer: Self-pay

## 2023-10-06 ENCOUNTER — Telehealth: Payer: Self-pay | Admitting: *Deleted

## 2023-10-06 ENCOUNTER — Ambulatory Visit (HOSPITAL_COMMUNITY)
Admission: RE | Admit: 2023-10-06 | Discharge: 2023-10-06 | Disposition: A | Discharge status: Home / Self Care | Source: Ambulatory Visit | Attending: Cardiology | Admitting: Cardiology

## 2023-10-06 DIAGNOSIS — I50813 Acute on chronic right heart failure: Secondary | ICD-10-CM | POA: Insufficient documentation

## 2023-10-06 DIAGNOSIS — Z981 Arthrodesis status: Secondary | ICD-10-CM | POA: Diagnosis not present

## 2023-10-06 DIAGNOSIS — J90 Pleural effusion, not elsewhere classified: Secondary | ICD-10-CM | POA: Diagnosis not present

## 2023-10-06 MED ORDER — TECHNETIUM TO 99M ALBUMIN AGGREGATED
4.0000 | Freq: Once | INTRAVENOUS | Status: AC | PRN
  Administered 2023-10-06: 4 via INTRAVENOUS

## 2023-10-06 NOTE — Telephone Encounter (Signed)
 Received call from radiology.  Patient needs chest X ray today prior to perfusion scan.  Had one on 9/12 but must be within 4-24 hours of perfusion scan.  Reviewed with Dr Elmira and OK to order chest xray.  Scan ordered.  Radiology aware

## 2023-10-08 ENCOUNTER — Telehealth: Payer: Self-pay | Admitting: Cardiology

## 2023-10-08 ENCOUNTER — Telehealth: Payer: Self-pay | Admitting: *Deleted

## 2023-10-08 NOTE — Telephone Encounter (Signed)
 Spoke with Pt. Pt received a call for pre admit for his Cath tomorrow. Advised pt that was the hospital and they may call him back today for that. Pt stated understanding and thanks.

## 2023-10-08 NOTE — Telephone Encounter (Signed)
 Right Heart Cath scheduled at [facility] for: Thursday October 09, 2023 9 AM Arrival time [facility] Main Entrance A at: 7 AM  Diet: -Nothing to eat after midnight.  Hydration: -May drink clear liquids until 2 hours before the procedure.  Approved liquids: Water , clear tea, black coffee, fruit juices-non-citric and without pulp,Gatorade, plain Jello/popsicles.  Medication instructions: -Hold:  Insulin -1/2 usual dose HS prior to procedure  Jardiance /Lasix -AM of procedure -Other usual morning medications can be taken.  Plan to go home the same day, you will only stay overnight if medically necessary.  You must have responsible adult to drive you home.  Someone must be with you the first 24 hours after you arrive home.  Reviewed procedure instructions with patient.

## 2023-10-08 NOTE — Telephone Encounter (Signed)
Patient states that he was returning a call. Please advise  

## 2023-10-09 ENCOUNTER — Other Ambulatory Visit: Payer: Self-pay

## 2023-10-09 ENCOUNTER — Encounter (HOSPITAL_COMMUNITY)
Admission: RE | Disposition: A | Discharge status: Home / Self Care | Payer: Self-pay | Source: Home / Self Care | Attending: Cardiology

## 2023-10-09 ENCOUNTER — Ambulatory Visit (HOSPITAL_COMMUNITY)
Admission: RE | Admit: 2023-10-09 | Discharge: 2023-10-09 | Disposition: A | Discharge status: Home / Self Care | Attending: Cardiology | Admitting: Cardiology

## 2023-10-09 DIAGNOSIS — I50812 Chronic right heart failure: Secondary | ICD-10-CM | POA: Insufficient documentation

## 2023-10-09 DIAGNOSIS — G4733 Obstructive sleep apnea (adult) (pediatric): Secondary | ICD-10-CM | POA: Diagnosis not present

## 2023-10-09 DIAGNOSIS — E119 Type 2 diabetes mellitus without complications: Secondary | ICD-10-CM | POA: Insufficient documentation

## 2023-10-09 DIAGNOSIS — Z79899 Other long term (current) drug therapy: Secondary | ICD-10-CM | POA: Diagnosis not present

## 2023-10-09 DIAGNOSIS — Z7984 Long term (current) use of oral hypoglycemic drugs: Secondary | ICD-10-CM | POA: Insufficient documentation

## 2023-10-09 DIAGNOSIS — I110 Hypertensive heart disease with heart failure: Secondary | ICD-10-CM | POA: Insufficient documentation

## 2023-10-09 DIAGNOSIS — E785 Hyperlipidemia, unspecified: Secondary | ICD-10-CM | POA: Diagnosis not present

## 2023-10-09 DIAGNOSIS — G20A1 Parkinson's disease without dyskinesia, without mention of fluctuations: Secondary | ICD-10-CM | POA: Insufficient documentation

## 2023-10-09 DIAGNOSIS — I2510 Atherosclerotic heart disease of native coronary artery without angina pectoris: Secondary | ICD-10-CM | POA: Diagnosis not present

## 2023-10-09 DIAGNOSIS — I350 Nonrheumatic aortic (valve) stenosis: Secondary | ICD-10-CM | POA: Diagnosis not present

## 2023-10-09 DIAGNOSIS — Z7982 Long term (current) use of aspirin: Secondary | ICD-10-CM | POA: Insufficient documentation

## 2023-10-09 HISTORY — PX: RIGHT HEART CATH: CATH118263

## 2023-10-09 LAB — POCT I-STAT EG7
Acid-Base Excess: 4 mmol/L — ABNORMAL HIGH (ref 0.0–2.0)
Acid-Base Excess: 3 mmol/L — ABNORMAL HIGH (ref 0.0–2.0)
Bicarbonate: 28.2 mmol/L — ABNORMAL HIGH (ref 20.0–28.0)
Bicarbonate: 27.5 mmol/L (ref 20.0–28.0)
Calcium, Ion: 1.23 mmol/L (ref 1.15–1.40)
Calcium, Ion: 1.19 mmol/L (ref 1.15–1.40)
HCT: 40 % (ref 39.0–52.0)
HCT: 38 % — ABNORMAL LOW (ref 39.0–52.0)
Hemoglobin: 13.6 g/dL (ref 13.0–17.0)
Hemoglobin: 12.9 g/dL — ABNORMAL LOW (ref 13.0–17.0)
O2 Saturation: 65 %
O2 Saturation: 69 %
Potassium: 3.7 mmol/L (ref 3.5–5.1)
Potassium: 3.6 mmol/L (ref 3.5–5.1)
Sodium: 140 mmol/L (ref 135–145)
Sodium: 141 mmol/L (ref 135–145)
TCO2: 29 mmol/L (ref 22–32)
TCO2: 29 mmol/L (ref 22–32)
pCO2, Ven: 41.4 mmHg — ABNORMAL LOW (ref 44–60)
pCO2, Ven: 40.7 mmHg — ABNORMAL LOW (ref 44–60)
pH, Ven: 7.442 — ABNORMAL HIGH (ref 7.25–7.43)
pH, Ven: 7.438 — ABNORMAL HIGH (ref 7.25–7.43)
pO2, Ven: 32 mmHg (ref 32–45)
pO2, Ven: 35 mmHg (ref 32–45)

## 2023-10-09 LAB — GLUCOSE, CAPILLARY
Glucose-Capillary: 124 mg/dL — ABNORMAL HIGH (ref 70–99)
Glucose-Capillary: 78 mg/dL (ref 70–99)

## 2023-10-09 SURGERY — RIGHT HEART CATH

## 2023-10-09 MED ORDER — SODIUM CHLORIDE 0.9% FLUSH: 3.0000 mL | INTRAVENOUS | Status: DC | PRN

## 2023-10-09 MED ORDER — ACETAMINOPHEN 325 MG PO TABS: 650.0000 mg | ORAL_TABLET | ORAL | Status: DC | PRN

## 2023-10-09 MED ORDER — HYDRALAZINE HCL 20 MG/ML IJ SOLN: 10.0000 mg | INTRAMUSCULAR | Status: DC | PRN

## 2023-10-09 MED ORDER — HEPARIN (PORCINE) IN NACL 1000-0.9 UT/500ML-% IV SOLN
INTRAVENOUS | Status: DC | PRN
  Administered 2023-10-09: 500 mL

## 2023-10-09 MED ORDER — LABETALOL HCL 5 MG/ML IV SOLN: 10.0000 mg | INTRAVENOUS | Status: DC | PRN

## 2023-10-09 MED ORDER — SODIUM CHLORIDE 0.9 % IV SOLN: 250.0000 mL | INTRAVENOUS | Status: DC | PRN

## 2023-10-09 MED ORDER — NITROGLYCERIN 1 MG/10 ML FOR IR/CATH LAB
INTRA_ARTERIAL | Status: AC
  Filled 2023-10-09: qty 10

## 2023-10-09 MED ORDER — SODIUM CHLORIDE 0.9% FLUSH: 3.0000 mL | Freq: Two times a day (BID) | INTRAVENOUS | Status: DC

## 2023-10-09 MED ORDER — NITROGLYCERIN 1 MG/10 ML FOR IR/CATH LAB
INTRA_ARTERIAL | Status: DC | PRN
  Administered 2023-10-09 (×2): 200 ug via INTRAVENOUS

## 2023-10-09 MED ORDER — LIDOCAINE HCL (PF) 1 % IJ SOLN
INTRAMUSCULAR | Status: AC
  Filled 2023-10-09: qty 30

## 2023-10-09 MED ORDER — LIDOCAINE HCL (PF) 1 % IJ SOLN
INTRAMUSCULAR | Status: DC | PRN
  Administered 2023-10-09: 2 mL via INTRADERMAL
  Administered 2023-10-09: 5 mL via INTRADERMAL

## 2023-10-09 MED ORDER — ONDANSETRON HCL 4 MG/2ML IJ SOLN: 4.0000 mg | Freq: Four times a day (QID) | INTRAMUSCULAR | Status: DC | PRN

## 2023-10-09 MED ORDER — FREE WATER
500.0000 mL | Freq: Once | Status: DC
Start: 2023-10-09 — End: 2023-10-09

## 2023-10-09 MED ORDER — IOHEXOL 350 MG/ML SOLN
INTRAVENOUS | Status: DC | PRN
  Administered 2023-10-09: 5 mL

## 2023-10-09 SURGICAL SUPPLY — 8 items
CATH BALLN WEDGE 5F 110CM (CATHETERS) IMPLANT
GUIDEWIRE .025 260CM (WIRE) IMPLANT
KIT MICROPUNCTURE NIT STIFF (SHEATH) IMPLANT
PACK CARDIAC CATHETERIZATION (CUSTOM PROCEDURE TRAY) IMPLANT
SHEATH GLIDE SLENDER 4/5FR (SHEATH) IMPLANT
SHEATH PINNACLE 7F 10CM (SHEATH) IMPLANT
SHEATH PROBE COVER 6X72 (BAG) IMPLANT
WIRE EMERALD 3MM-J .025X260CM (WIRE) IMPLANT

## 2023-10-09 NOTE — H&P (Signed)
 OV 09/26/2023 copied for documentation   Cardiology Office Note:  .   Date:  10/09/2023  ID:  Anthony [name], DOB 1945/06/26, MRN [id number] PCP: Vernadine Charlie ORN, MD  [facility] Providers Cardiologist:  Newman Lawrence, MD PCP: Vernadine Charlie ORN, MD  C/C: Leg edema    History of Present Illness: .    Anthony [name] is a 78 y.o. male with hypertension, hyperlipidemia, CAD, type 2 DM, mild AS, OSA on CPAP, hypothyroidism, Parkinson's disease, GERD, right-sided heart failure  Patient was hospitalized in 06/2023 with right-sided heart failure, was noted to have mild RV systolic dysfunction.  Subsequently, Jardiance  was taken off due to hypotension and AKI.  However, Jardiance  was resumed by PCP, primarily for management of his diabetes.  He is now taking Jardiance  25 mg daily.  Blood pressures have steadily increased in the last few weeks.  He is also noticed return of leg edema, as well as abdominal distention.     Vitals:   10/09/23 0724  BP: (!) 142/86  Pulse: 80  Resp: 15  Temp: 97.9 F (36.6 C)  SpO2: 97%        ROS:  Review of Systems  Cardiovascular:  Positive for leg swelling and orthopnea. Negative for chest pain, dyspnea on exertion, palpitations and syncope.     Studies Reviewed: SABRA        EKG 06/20/2023: Sinus bradycardia Rightward axis Possible Inferior infarct , age undetermined When compared with ECG of 28-Aug-2021 11:57, Borderline criteria for Inferior infarct are now Present Non-specific change in ST segment in Inferior leads T wave inversion now evident in Inferior leads T wave inversion now evident in Lateral leads    Echocardiogram 09/2023:  1. Left ventricular ejection fraction, by estimation, is 65 to 70%. The  left ventricle has normal function. The left ventricle has no regional  wall motion abnormalities. There is mild left ventricular hypertrophy.  Left ventricular diastolic parameters were normal. The average left  ventricular global longitudinal strain is -26.1 % (Normal).  2. Right ventricular systolic function is mildly reduced. The right  ventricular size is normal. Tricuspid regurgitation signal is inadequate  for assessing PA pressure.   3. The mitral valve is degenerative. Trivial mitral valve regurgitation.  No evidence of mitral stenosis.   4. The aortic valve is abnormal. There is severe calcifcation of the  aortic valve. Aortic valve regurgitation is not visualized. Mild aortic  valve stenosis. Aortic valve area, by VTI measures 1.36 cm. Aortic valve  mean gradient measures 9.8 mmHg. Aortic valve Vmax measures 2.07 m/s.   5. The inferior vena cava is dilated in size with <50% respiratory  variability, suggesting right atrial pressure of 15 mmHg.   Comparison(s): A prior study was performed on 02/26/2023. Reported LVEF 60  to 65%, grade 1 diastolic dysfunction, average GLS -26.6%, mild aortic  stenosis, estimated RAP 15 mmHg.    Labs 4-05/2023: HbA1C 6.0%  09/2022: Chol 141, TG 167, HDL 35, LDL 77 HbA1C 6.0% Hb 13.5 Cr 0.9  Physical Exam:   Physical Exam Vitals and nursing note reviewed.  Constitutional:      General: He is not in acute distress. Neck:     Vascular: No JVD.  Cardiovascular:     Rate and Rhythm: Normal rate and regular rhythm.     Heart sounds: Murmur heard.     Harsh midsystolic murmur is present with a grade of 2/6 at the upper right sternal border radiating to the neck.  Pulmonary:     Effort: Pulmonary effort is normal.     Breath sounds: Normal breath sounds. No wheezing or rales.  Abdominal:     Comments: Distension  Musculoskeletal:     Right lower leg: Edema (1+) present.     Left lower leg: Edema (1+) present.      VISIT DIAGNOSES: No diagnosis found.       ASSESSMENT AND PLAN: .    Anthony [name] is a 78 y.o. male with hypertension, hyperlipidemia, mod nonobstructive CAD, type 2 DM, OSA on CPAP, hypothyroidism, GERD,  right-sided heart failure  Acute on chronic RV failure: Mild RV systolic dysfunction noted on 2 echocardiograms 3 months apart.  Clinically, he is more symptomatic again with leg edema and abdominal distention. He is currently on Jardiance  25 mg daily. Added Lasix  40 mg daily. In addition, recommend further evaluation for etiology of his RV failure, given his recurrent symptoms. Recommend VQ scan to rule out any significant pulmonary thromboembolism. Also recommend right heart catheterization to evaluate for pulmonary hypertension. Continue management for OSA with CPAP.    CAD: No anginal symptoms. Given low normal blood pressure, stop Imdur  50 mg daily. Continue aspirin , statin.   Aortic stenosis: Mild, will repeat echocardiogram in 12-18 months.  Will order at next visit.  Type 2 diabetes mellitus: Continue follow-up with PCP.   Hypertension: Controlled   Parkinson's disease: Continue baseline Parkinson's medications.    Informed Consent   Shared Decision Making/Informed Consent The risks, including but not limited to, [bleeding or vascular complications (1 in 500), pneumothorax (1 in 1600), arrhythmia (1 in 1000) and death (1 in 5000)], benefits (diagnostic support and/or management of heart failure, pulmonary hypertension) and alternatives of a right heart catheterization were discussed in detail with Mr. Deleo and he is willing to proceed.      F/u after right heart catheterization  Signed, Newman JINNY Lawrence, MD

## 2023-10-09 NOTE — Discharge Instructions (Signed)
 Femeral Site Care   This sheet gives you information about how to care for yourself after your procedure. Your health care provider may also give you more specific instructions. If you have problems or questions, contact your health care provider. What can I expect after the procedure? After the procedure, it is common to have: Bruising and tenderness at the catheter insertion area. Follow these instructions at home:  Insertion site care Follow instructions from your health care provider about how to take care of your insertion site. Make sure you: Wash your hands with soap and water  before you change your bandage (dressing). If soap and water  are not available, use hand sanitizer. Remove your dressing as told by your health care provider. In 24 hours Check your insertion site every day for signs of infection. Check for: Redness, swelling, or pain. Pus or a bad smell. Warmth. You may shower 24-48 hours after the procedure. Do not apply powder or lotion to the site.  Activity For 24 hours after the procedure, or as directed by your health care provider: Do not quat for 24 hrs  Do not drive yourself home from the hospital or clinic. You may drive 24 hours after the procedure unless your health care provider tells you not to. Do not lift anything that is heavier than 10 lb (4.5 kg), or the limit that you are told, until your health care provider says that it is safe.  For 24 hours

## 2023-10-09 NOTE — Interval H&P Note (Signed)
 History and Physical Interval Note:  10/09/2023 9:37 AM  Anthony [name]  has presented today for surgery, with the diagnosis of heart failure.  The various methods of treatment have been discussed with the patient and family. After consideration of risks, benefits and other options for treatment, the patient has consented to  Procedure(s): RIGHT HEART CATH (N/A) as a surgical intervention.  The patient's history has been reviewed, patient examined, no change in status, stable for surgery.  I have reviewed the patient's chart and labs.  Questions were answered to the patient's satisfaction.     Tynisha Ogan J Angelly Spearing

## 2023-10-10 ENCOUNTER — Encounter (HOSPITAL_COMMUNITY): Payer: Self-pay | Admitting: Cardiology

## 2023-10-17 DIAGNOSIS — E1165 Type 2 diabetes mellitus with hyperglycemia: Secondary | ICD-10-CM | POA: Diagnosis not present

## 2023-10-21 DIAGNOSIS — E1140 Type 2 diabetes mellitus with diabetic neuropathy, unspecified: Secondary | ICD-10-CM | POA: Diagnosis not present

## 2023-10-21 DIAGNOSIS — E7800 Pure hypercholesterolemia, unspecified: Secondary | ICD-10-CM | POA: Diagnosis not present

## 2023-10-21 DIAGNOSIS — I5030 Unspecified diastolic (congestive) heart failure: Secondary | ICD-10-CM | POA: Diagnosis not present

## 2023-10-21 DIAGNOSIS — M109 Gout, unspecified: Secondary | ICD-10-CM | POA: Diagnosis not present

## 2023-10-21 DIAGNOSIS — Z1212 Encounter for screening for malignant neoplasm of rectum: Secondary | ICD-10-CM | POA: Diagnosis not present

## 2023-10-21 DIAGNOSIS — I110 Hypertensive heart disease with heart failure: Secondary | ICD-10-CM | POA: Diagnosis not present

## 2023-10-21 DIAGNOSIS — Z125 Encounter for screening for malignant neoplasm of prostate: Secondary | ICD-10-CM | POA: Diagnosis not present

## 2023-10-21 DIAGNOSIS — E7849 Other hyperlipidemia: Secondary | ICD-10-CM | POA: Diagnosis not present

## 2023-10-21 DIAGNOSIS — E039 Hypothyroidism, unspecified: Secondary | ICD-10-CM | POA: Diagnosis not present

## 2023-10-23 ENCOUNTER — Encounter: Payer: Self-pay | Admitting: Cardiology

## 2023-10-23 ENCOUNTER — Ambulatory Visit: Attending: Cardiology | Admitting: Cardiology

## 2023-10-23 VITALS — BP 100/54 | HR 64 | Ht 70.5 in | Wt 207.5 lb

## 2023-10-23 DIAGNOSIS — I2510 Atherosclerotic heart disease of native coronary artery without angina pectoris: Secondary | ICD-10-CM

## 2023-10-23 DIAGNOSIS — I10 Essential (primary) hypertension: Secondary | ICD-10-CM | POA: Diagnosis not present

## 2023-10-23 DIAGNOSIS — E782 Mixed hyperlipidemia: Secondary | ICD-10-CM | POA: Diagnosis not present

## 2023-10-23 NOTE — Progress Notes (Signed)
 Cardiology Office Note:  .   Date:  10/23/2023  ID:  Anthony [name], DOB [birth year], MRN [id number] PCP: Anthony Charlie ORN, MD  [facility] Providers Cardiologist:  Newman Lawrence, MD PCP: Anthony Charlie ORN, MD  Chief Complaint  Patient presents with   Leg Swelling      History of Present Illness: .    Anthony [name] is a 78 y.o. male with hypertension, hyperlipidemia, CAD, type 2 DM, mild AS, OSA on CPAP, hypothyroidism, Parkinson's disease, GERD, right-sided heart failure  Patient is doing well.  Leg swelling has resolved.  Has not had any recent dizziness episodes.  He is taking Lasix  40 mg daily.   Vitals:   10/23/23 0757  BP: (!) 100/54  Pulse: 64  SpO2: 95%        ROS:  Review of Systems  Cardiovascular:  Negative for chest pain, dyspnea on exertion, leg swelling, orthopnea, palpitations and syncope.     Studies Reviewed: SABRA        EKG 10/23/2023: Normal sinus rhythm Nonspecific T wave abnormality When compared with ECG of 09-Oct-2023 07:38, No significant change was found     Right heart catheterization 10/09/2023: RA: 6 mmHg RV: 28/0 mmHg PA: 25.10 mmHg, mPAP 18 mmHg PCW: 10 mmHg   PAPi 2.5   AO sats: 99% PA sats: 67%   CO: 4.7 L/min CI: 2.2 L/min/m2   Normal RV systolic function No pulmonary hypertension      Echocardiogram 09/2023:  1. Left ventricular ejection fraction, by estimation, is 65 to 70%. The  left ventricle has normal function. The left ventricle has no regional  wall motion abnormalities. There is mild left ventricular hypertrophy.  Left ventricular diastolic parameters were normal. The average left ventricular global longitudinal strain is -26.1 % (Normal).  2. Right ventricular systolic function is mildly reduced. The right  ventricular size is normal. Tricuspid regurgitation signal is inadequate  for assessing PA pressure.   3. The mitral valve is degenerative. Trivial mitral valve regurgitation.   No evidence of mitral stenosis.   4. The aortic valve is abnormal. There is severe calcifcation of the  aortic valve. Aortic valve regurgitation is not visualized. Mild aortic  valve stenosis. Aortic valve area, by VTI measures 1.36 cm. Aortic valve  mean gradient measures 9.8 mmHg. Aortic valve Vmax measures 2.07 m/s.   5. The inferior vena cava is dilated in size with <50% respiratory  variability, suggesting right atrial pressure of 15 mmHg.   Comparison(s): A prior study was performed on 02/26/2023. Reported LVEF 60  to 65%, grade 1 diastolic dysfunction, average GLS -26.6%, mild aortic  stenosis, estimated RAP 15 mmHg.    Labs 4-05/2023: HbA1C 6.0%  09/2022: Chol 141, TG 167, HDL 35, LDL 77 HbA1C 6.0% Hb 13.5 Cr 0.9  Physical Exam:   Physical Exam Vitals and nursing note reviewed.  Constitutional:      General: He is not in acute distress. Neck:     Vascular: No JVD.  Cardiovascular:     Rate and Rhythm: Normal rate and regular rhythm.     Heart sounds: Murmur heard.     Harsh midsystolic murmur is present with a grade of 2/6 at the upper right sternal border radiating to the neck.  Pulmonary:     Effort: Pulmonary effort is normal.     Breath sounds: Normal breath sounds. No wheezing or rales.  Abdominal:     Comments: Distension  Musculoskeletal:     Right  lower leg: No edema.     Left lower leg: No edema.      VISIT DIAGNOSES:   ICD-10-CM   1. Essential hypertension  I10 EKG 12-Lead    2. Coronary artery disease of native artery of native heart with stable angina pectoris  I25.118 EKG 12-Lead    3. Mixed hyperlipidemia  E78.2 EKG 12-Lead          ASSESSMENT AND PLAN: .    Anthony [name] is a 78 y.o. male with hypertension, hyperlipidemia, mod nonobstructive CAD, type 2 DM, OSA on CPAP, hypothyroidism, GERD, right-sided heart failure  RV systolic dysfunction: No evidence of pulmonary hypertension or RV failure on right heart  catheterization. Clinically, compensated. Continue Jardiance  25 mg daily, Lasix  40 mg daily. Continue management of OSA with CPAP.  CAD: No anginal symptoms. Continue aspirin , statin.   Aortic stenosis: Mild, will repeat echocardiogram in 10/2024. Will order at next visit.  Type 2 diabetes mellitus: Continue follow-up with PCP. If blood pressure improves, can consider adding ARB for renal protection.  Defer to Dr. Tisovec.   Hypertension: Controlled   Parkinson's disease: Continue baseline Parkinson's medications.  F/u in 6 months  Signed, Newman JINNY Lawrence, MD

## 2023-10-23 NOTE — Patient Instructions (Signed)

## 2023-10-27 DIAGNOSIS — R32 Unspecified urinary incontinence: Secondary | ICD-10-CM | POA: Diagnosis not present

## 2023-10-27 DIAGNOSIS — E669 Obesity, unspecified: Secondary | ICD-10-CM | POA: Diagnosis not present

## 2023-10-27 DIAGNOSIS — Z794 Long term (current) use of insulin: Secondary | ICD-10-CM | POA: Diagnosis not present

## 2023-10-27 DIAGNOSIS — I2510 Atherosclerotic heart disease of native coronary artery without angina pectoris: Secondary | ICD-10-CM | POA: Diagnosis not present

## 2023-10-27 DIAGNOSIS — G20C Parkinsonism, unspecified: Secondary | ICD-10-CM | POA: Diagnosis not present

## 2023-10-27 DIAGNOSIS — R82998 Other abnormal findings in urine: Secondary | ICD-10-CM | POA: Diagnosis not present

## 2023-10-27 DIAGNOSIS — E1140 Type 2 diabetes mellitus with diabetic neuropathy, unspecified: Secondary | ICD-10-CM | POA: Diagnosis not present

## 2023-10-27 DIAGNOSIS — I5030 Unspecified diastolic (congestive) heart failure: Secondary | ICD-10-CM | POA: Diagnosis not present

## 2023-10-27 DIAGNOSIS — K219 Gastro-esophageal reflux disease without esophagitis: Secondary | ICD-10-CM | POA: Diagnosis not present

## 2023-10-27 DIAGNOSIS — N401 Enlarged prostate with lower urinary tract symptoms: Secondary | ICD-10-CM | POA: Diagnosis not present

## 2023-10-27 DIAGNOSIS — Z1331 Encounter for screening for depression: Secondary | ICD-10-CM | POA: Diagnosis not present

## 2023-10-27 DIAGNOSIS — M109 Gout, unspecified: Secondary | ICD-10-CM | POA: Diagnosis not present

## 2023-10-27 DIAGNOSIS — Z1339 Encounter for screening examination for other mental health and behavioral disorders: Secondary | ICD-10-CM | POA: Diagnosis not present

## 2023-10-27 DIAGNOSIS — E7800 Pure hypercholesterolemia, unspecified: Secondary | ICD-10-CM | POA: Diagnosis not present

## 2023-10-27 DIAGNOSIS — G2581 Restless legs syndrome: Secondary | ICD-10-CM | POA: Diagnosis not present

## 2023-10-27 DIAGNOSIS — I110 Hypertensive heart disease with heart failure: Secondary | ICD-10-CM | POA: Diagnosis not present

## 2023-10-27 DIAGNOSIS — Z23 Encounter for immunization: Secondary | ICD-10-CM | POA: Diagnosis not present

## 2023-10-27 DIAGNOSIS — Z0000 Encounter for general adult medical examination without abnormal findings: Secondary | ICD-10-CM | POA: Diagnosis not present

## 2023-10-30 ENCOUNTER — Encounter: Payer: Self-pay | Admitting: Neurology

## 2023-10-30 ENCOUNTER — Ambulatory Visit: Admitting: Neurology

## 2023-10-30 VITALS — BP 116/67 | HR 70 | Ht 70.5 in | Wt 207.2 lb

## 2023-10-30 DIAGNOSIS — G212 Secondary parkinsonism due to other external agents: Secondary | ICD-10-CM

## 2023-10-30 DIAGNOSIS — G4733 Obstructive sleep apnea (adult) (pediatric): Secondary | ICD-10-CM

## 2023-10-30 DIAGNOSIS — I50811 Acute right heart failure: Secondary | ICD-10-CM

## 2023-10-30 DIAGNOSIS — I5032 Chronic diastolic (congestive) heart failure: Secondary | ICD-10-CM

## 2023-10-30 DIAGNOSIS — F4310 Post-traumatic stress disorder, unspecified: Secondary | ICD-10-CM

## 2023-10-30 DIAGNOSIS — G622 Polyneuropathy due to other toxic agents: Secondary | ICD-10-CM | POA: Diagnosis not present

## 2023-10-30 MED ORDER — CARBIDOPA-LEVODOPA 25-100 MG PO TABS: 1.0000 | ORAL_TABLET | Freq: Three times a day (TID) | ORAL | 3 refills | Status: AC

## 2023-10-30 NOTE — Progress Notes (Signed)
 @[facility]   Provider:  Dedra Gores, MD    Primary Care Physician:  Tisovec, Richard W, MD [address] [place] [address]   Referring Provider: Vernadine Charlie ORN, Md [address] [place],  [place] [address]    Dr Elmira , MD        Chief Concern for this Consultation:   Patient presents with     New CPAP settings. FFM under the nose  F 30I?      HPI: I have the pleasure of meeting with Anthony [name]  and his wife , on 10-30-2023, Chief concern according to patient:  My machine is from the [facility]. They furnished the machine and equipment to go along with it.  My PTSD acted up during the study, I scared the technician and his wife interjects that he has break -through snoring and seems to be acting out dreams- he sleeps in another bedroom.   Anthony [name] is a 78 y.o. male with hypertension, hyperlipidemia, CAD, type 2 DM, mild AS, OSA on CPAP, hypothyroidism, Parkinson's disease, GERD, right-sided heart failure. He reports his seal of the CPAP mask is no longer working, he lost 42 pounds since  admission to hospital , likely all fluid.    Patient was hospitalized in 06/2023 with right-sided heart failure, was noted to have mild RV systolic dysfunction.  Subsequently, Jardiance  was taken off due to hypotension and AKI.  However, Jardiance  was resumed by PCP, primarily for management of his diabetes.  He is now taking Jardiance  25 mg daily.  Blood pressures have steadily increased in the last few weeks.  He is also had noticed return of leg edema, as well as abdominal distention over the month of September.  Dr Tisovec has followed closely.  He underwent a right heart cath in September,  He has been taken off Losartin by Dr Elmira after becoming hypotensive, falling frequently .   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   He was last seen by Duwaine Russell, NP for a revisit , she ordered a new titration study: 08-06-2023  reportedly struggling with his CPAP and persistent Hypersomnia. CPAP data were reviewed. A new attended study was ordered including new mask fitting.  Anthony [name] is a 77 y.o. male patient with Parkinsons disease who is here for revisit 12/26/2022 for persistent hypersomnia while on CPAP. The patient has PD, haring loss and diabetes, gets many of his medications from the [facility].  he has struggled with high insulin  costs and relies on Jardiance  as well. His CPAP is working OK for him,  highly compliant he had some higher residual AHIs but remains under 10/ h. But his air leakage is very, very high on a ResMed P 10 pillow. That one has flimsy headgear. He reportedly sleeps 8.5 hours at night and additional naps throughout the day when he watches Tv, for example.  Reports large air leak. PD may cause some sleepiness too and central apneas.  Acting out dreams was seen last year, identified as PTSD per [facility] records, and not REM BD - which is most likely to occur in  a PD patient.  Screaming, kicking, fighting. Has once fallen out of bed. The medication has reduced this significantly.  The Epworth Sleepiness Scale was 19 out of 24 (scores above or equal to 10 are suggestive of hypersomnolence).  CPAP was initiated under a new mask, a FFM in large by ResMed , the F 20. Pressure was applied at 5 cm water  with 3 cm EPR and increased to a final pressure of 15 cm water  CPAP. BiPAP was attempted after the 15 cm water  pressure of  CPAP did not work out. BiPAP pressure of 16/ 12 cm water  reduced the AHI to 5.45/h, there was no hypoxia and 34% REM sleep was seen.   CPAP at 11 cm water  pressure did work well for the apnea control but was seen over 47 minutes with 100% sleep efficiency. higher pressures and lower CPAP pressure did not work out. BiPAP at 16/ 12 cm water  reduced the apnea to 5.45 /h , not better than  CPAP did.   2. Total sleep time was within normal limits.  The quality of sleep improved with positive airway pressure.  All supine sleep    3. Mild periodic limb movements (PLMS) were observed and these did not intrude into REM sleep.  I like DME to set the patient's current auto- CPAP to 11 cm water  with  3 cm EPR, using the new mask F20 in large ( a FFM by ResMed AirFit).  If the CPAP downloads improve in the upcoming visits, then no further changes will be needed.  If not, I recommend to switch to a BiPAP of 13/ 9 cm water  with the new mask as well.    Rv with NP or me within the next 3 months.   Review of Systems: Out of a complete 14 system review, the patient complains of only the following symptoms, and all other reviewed systems are negative.:   Falls with head injury, fell on his face.   Hypotension, orthopnea  Hypersomnia  Nocturia   Sneezing (!) nasal congestion, PTSD,     How likely are you to doze in the following situations: 0 = not likely, 1 = slight chance, 2 = moderate chance, 3 = high chance Sitting and Reading? Watching Television? Sitting inactive in a public place (theater or meeting)? As a passenger in a car for an hour without a break? Lying down in the afternoon when circumstances permit? Sitting and talking to someone? Sitting quietly after lunch without alcohol ? In a car, while stopped for a few minutes in traffic?   Total ESS =11 / 24 points.    FSS endorsed at 40/ 63 points.  GDS:  Social History   Socioeconomic History   Marital status: Married    Spouse name:  Verneita   Number of children: 2   Years of education: 12   Highest education level: Not on file  Occupational History   Occupation: [employer]    Employer: [employer]  Tobacco Use   Smoking status: Never   Smokeless tobacco: Never  Vaping Use   Vaping status: Never Used  Substance and Sexual Activity   Alcohol  use: No   Drug use: No   Sexual activity: Not Currently  Other Topics Concern   Not on file  Social History Narrative   Patient is married Thomasine) and lives at home with his wife.Patient has two children (twins).Patient is retired.Patient has a high school education.   1 cup of ice tea    Patient is left-handed.  Social Drivers of Corporate investment banker Strain: Not on file  Food Insecurity: No Food Insecurity (08/16/2023)   Hunger Vital Sign    Worried About Running Out of Food in the Last Year: Never true    Ran Out of Food in the Last Year: Never true  Transportation Needs: No Transportation Needs (08/16/2023)   PRAPARE - Administrator, Civil Service (Medical): No    Lack of Transportation (Non-Medical): No  Physical Activity: Not on file  Stress: Not on file  Social Connections: Socially Integrated (08/16/2023)   Social Connection and Isolation Panel    Frequency of Communication with Friends and Family: More than three times a week    Frequency of Social Gatherings with Friends and Family: More than three times a week    Attends Religious Services: More than 4 times per year    Active Member of Clubs or Organizations: Yes    Attends Engineer, structural: More than 4 times per year    Marital Status: Married    Family History  Problem Relation Age of Onset   Heart disease Mother    Diabetes Mother    Neuropathy Mother    Colon cancer Father    Diabetes Sister    Lung cancer Brother    Sleep apnea Brother    Colon cancer Brother 7   Diabetes Maternal Grandmother    Heart attack Maternal Grandmother    Colon cancer  Maternal Grandfather    Heart Problems Maternal Grandfather    Prostate cancer Maternal Grandfather    Cancer Maternal Grandfather        right eye   Heart attack Maternal Grandfather    Stomach cancer Neg Hx    Ulcerative colitis Neg Hx    Esophageal cancer Neg Hx    Parkinson's disease Neg Hx     Past Medical History:  Diagnosis Date   Anginal pain    Anxiety    Arthritis    bilateral hands   BPH (benign prostatic hyperplasia)    Congestive heart failure (CHF) (HCC)    Coronary artery disease    Depression    Diabetes mellitus type 2, controlled (HCC)    Fatty liver    GERD (gastroesophageal reflux disease)    Gout    last flare up last week right ankle    Heart disease    Heart murmur    Hernia, ventral    HTN (hypertension)    Hyperlipidemia    Hypothyroidism    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   Neuromuscular disorder (HCC) 2023   Parkinson's Disease   Neuropathy    left leg greater than right leg   Obesity (BMI 30.0-34.9) 05/14/2013   OSA on CPAP    cpap setting of 10/ 13   Pancreatitis dx march 2016   Pneumonia 12 years ago    Past Surgical History:  Procedure Laterality Date   ANTERIOR CERVICAL DECOMP/DISCECTOMY FUSION N/A 08/02/2022   Procedure: Anterior Cervical Decompression/Discectomy Fusion - Cervical Three-Cervical Four,  Cervical Four-Cervical Five,  remove Cervical Five-Cervical Six Plate;  Surgeon: Joshua Alm Hamilton, MD;  Location: [facility] OR;  Service: Neurosurgery;  Laterality: N/A;  3C   BACK SURGERY  10/2009   Cervical, arterior   CARPAL TUNNEL RELEASE Left 2003   CARPAL TUNNEL RELEASE Bilateral    CATARACT EXTRACTION Bilateral 01/2012   COLONOSCOPY  2024   CORONARY BALLOON ANGIOPLASTY N/A 08/28/2021   Procedure: CORONARY BALLOON ANGIOPLASTY;  Surgeon: Elmira,  Newman PARAS, MD;  Location: [facility];  Service: Cardiovascular;  Laterality: N/A;   EUS N/A 07/15/2014   Procedure: FULL UPPER ENDOSCOPIC ULTRASOUND (EUS) RADIAL;  Surgeon:  Belvie Just, MD;  Location: [facility];  Service: Endoscopy;  Laterality: N/A;   LEFT HEART CATH AND CORONARY ANGIOGRAPHY N/A 05/18/2019   Procedure: LEFT HEART CATH AND CORONARY ANGIOGRAPHY;  Surgeon: Ladona Heinz, MD;  Location: [facility];  Service: Cardiovascular;  Laterality: N/A;   LEFT HEART CATH AND CORONARY ANGIOGRAPHY N/A 08/28/2021   Procedure: LEFT HEART CATH AND CORONARY ANGIOGRAPHY;  Surgeon: Elmira Newman PARAS, MD;  Location: [facility];  Service: Cardiovascular;  Laterality: N/A;   NASAL SINUS SURGERY  1981   RETINAL Bilateral 06/2013   Retinal peel   RIGHT HEART CATH N/A 10/09/2023   Procedure: RIGHT HEART CATH;  Surgeon: Elmira Newman PARAS, MD;  Location: [facility];  Service: Cardiovascular;  Laterality: N/A;   SHOULDER SURGERY Right 2003   TONSILLECTOMY  1954     Current Outpatient Medications on File Prior to Visit  Medication Sig Dispense Refill   acetaminophen  (TYLENOL ) 500 MG tablet Take 500-1,000 mg by mouth every 6 (six) hours as needed for moderate pain (pain score 4-6).     allopurinol  (ZYLOPRIM ) 300 MG tablet Take 300 mg by mouth in the morning.     aspirin  EC 81 MG tablet Take 81 mg by mouth in the morning. Swallow whole.     busPIRone  (BUSPAR ) 10 MG tablet Take 5 mg by mouth 2 (two) times daily.     carbidopa -levodopa  (SINEMET  IR) 25-100 MG tablet Take 1 tablet by mouth 3 (three) times daily.     carboxymethylcellulose (REFRESH PLUS) 0.5 % SOLN Place 1 drop into both eyes in the morning and at bedtime.     Cholecalciferol  25 MCG (1000 UT) tablet Take 1,000 Units by mouth daily.     colchicine 0.6 MG tablet Take 0.6 mg by mouth daily as needed (Gout Flare up).     divalproex  (DEPAKOTE  ER) 250 MG 24 hr tablet Take 500 mg by mouth at bedtime.     DULoxetine  (CYMBALTA ) 30 MG capsule Take 30 mg by mouth at bedtime.     empagliflozin  (JARDIANCE ) 25 MG TABS tablet Take 25 mg by mouth in the morning.     famotidine  (PEPCID ) 40 MG tablet  Take 40 mg by mouth daily.     furosemide  (LASIX ) 40 MG tablet Take 1 tablet (40 mg total) by mouth daily. 90 tablet 2   gabapentin  (NEURONTIN ) 600 MG tablet Take 600 mg by mouth 3 (three) times daily.     Homeopathic Products (THERAWORX RELIEF EX) Apply 1 Application topically daily as needed (leg cramps).     insulin  glargine (LANTUS ) 100 UNIT/ML Solostar Pen Inject 15 Units into the skin at bedtime. Inject 25 units subcutaneously at bedtime. (Patient taking differently: Inject 18 Units into the skin at bedtime.)     levothyroxine  (SYNTHROID ) 25 MCG tablet Take 25 mcg by mouth daily before breakfast.     methocarbamol  (ROBAXIN ) 500 MG tablet Take 1 tablet (500 mg total) by mouth every 6 (six) hours as needed for muscle spasms. 30 tablet 0   mupirocin ointment (BACTROBAN) 2 % Apply 1 Application topically 2 (two) times daily as needed (skin irritation).     nitroGLYCERIN  (NITROSTAT ) 0.4 MG SL tablet Place 0.4 mg under the tongue every 5 (five) minutes as needed for chest pain.  pantoprazole  (PROTONIX ) 40 MG tablet Take protonix  40 mg twice daily for 2 months.  Resume once per day after that. (Patient taking differently: Take 40 mg by mouth daily.) 60 tablet 0   pyridOXINE  (VITAMIN B-6) 100 MG tablet Take 100 mg by mouth in the morning.     rosuvastatin  (CRESTOR ) 20 MG tablet TAKE 1 TABLET BY MOUTH EVERY DAY (Patient taking differently: Take 20 mg by mouth at bedtime.) 90 tablet 3   sitaGLIPtin (JANUVIA) 100 MG tablet Take 100 mg by mouth daily.     Sodium Fluoride 1.1 % PSTE Place 1 Application onto teeth 2 (two) times daily.     solifenacin (VESICARE) 5 MG tablet Take 5 mg by mouth daily.     tamsulosin  (FLOMAX ) 0.4 MG CAPS capsule Take 0.4 mg by mouth at bedtime.     traZODone  (DESYREL ) 50 MG tablet Take 25 mg by mouth at bedtime.     White Petrolatum-Mineral Oil (SOOTHE NIGHTTIME) OINT Apply 1 Application to eye at bedtime.     No current facility-administered medications on file prior to  visit.    Allergies  Allergen Reactions   Lyrica [Pregabalin] Other (See Comments)    Delirium, Dizziness, Drowsy   Metformin  Diarrhea   Ropinirole     Other Reaction(s): LE edema requiring diuretics and med discontinuation    Vitals:   10/30/23 0824  BP: 116/67  Pulse: 70     Physical exam:   General: The patient was alert and appears not in acute distress.  Mood and affect are appropriate .  The patient's interactions are: Cooperative, makes eye contact, follows the instructions and answers questions coherently. He has word finding difficulties.   The patient is groomed and appropriately groomed and dressed. Head: Normocephalic, atraumatic.  Neck is supple.  Mallampati: .  The neck circumference measured 19  inches. Nasal airflow was not fully patent ,  he has undergone nasal sp septal deviation repair in the past.  Overbite  seen   Dental status:  biological  Cardiovascular:  Regular rate and cardiac rhythm by palpable pulse. Respiratory: no audible wheezing, no tachypnoea.   Skin:  Without evidence of ankle edema. No discoloration.  Trunk:  BMI is 29.3  The patient's posture was slightly stooped.    Neurologic exam : The patient was awake and alert, oriented to place and time.   Attention span & concentration ability appeared normal.   Speech was less fluent, with mild dysarthria, with  dysphonia low volume.     Cranial nerves:   loss of smell  reported  Pupils are round, equal in size and briskly reactive to light.  Funduscopic exam was deferred.  Extraocular movements in vertical and horizontal planes were intact and without nystagmus. (No Diplopia reported). Visual fields by finger perimetry are intact. Hearing was impaired.     Facial sensation intact to fine touch.  Facial motor strength: Symmetric movement and tongue and uvula move midline.  Neck ROM: rotation, tilt and flexion extension were intact for age and shoulder shrug was symmetrical.     Motor exam:  COGWHEELING in both biceps, increased tone in both wrists.  Normal muscle bulk and symmetric normal strength in all extremities.   Sensory:  Fine touch, pinprick and vibration were decreased in  lower extremities, numbness to vibration in both feet,  Proprioception is tested in the upper extremities only. This was normal.   Coordination: Rapid alternating movements in the fingers/hands is left side slowed, and abnormal.  Finger-to-nose  maneuver tested : slowed with evidence of ataxia, dysmetria and tremor on the left over right .  Left sided pill rolling tremor, large amplitude.     Gait and station: Patient walks without assistive device , bare feet,  normal Stance is . Tandem gait is impaired. He is walking slightly stooped, turning with 4 steps, unsafe turning. He has reduced arm swing in both arms, but more  reduced on the left, and his pill rolling tremor continued. he, turns with 4 Steps are fragmented.  Romberg testing is abnormal.   Deep tendon reflexes:  patella achilles, not even a trace of DTR.    I would like to thank Dr Elmira and Tisovec, Richard W, Md [address] [place],  [place] [address] for allowing me to meet with Anthony [name] and Anthony [name] ,   In short, Anthony Beneke  is presenting with a new CPAP setting but was not switched to BiPAP. His residual AHI is < 2 h and he sleeps with the head of bed elevated. Still has 1-2 times nocturia , also related to Jardiance . I gave him today a FFM N 20 standart in large  to see of the high air leaks can be reduced.  No changes in settings . No EPR is set.    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 He has not fallen sicne  he received  PT / OT have helped . Dysphonia with low volume voice.  4) falls are like also related to low BP( he was dizzy and his HYTN medication  had to be adjusted, he is feeling better.  5) diabetic neuropathy also contributes to fall risk, he walks best on even ground.  PD / Gait disorder : important also because his agent orange exposure can be related to this, neuropathy in diabetes, and likely worsening as is his Parkinson -Disorder     My Plan is to proceed with:.  I will continue current medication, carbi dopa / Levo Dopa 25/ 100 TID before meals.   Yearly RV 12 months. If he needs more PT I am happy to order it.  Supplies and settings for CPAP remain as is. [facility]   I gave him a ResMed FFM N 20 to try in large size with headgear-     I plan to follow up personally or through our NP within 12 months.   A total time of  40  minutes consistent of a part of face to face encounter , exam and interview,  and additional preparation time for chart review was spent .  At today's visit, we discussed treatment options, associated risk and benefits, and engage in counseling as needed including, but not limited to:  Sleep hygiene, Quality Sleep Habits, and Safety concerns for patients with daytime sleepiness who are warned to not operate machinery/ motor vehicles when drowsy. Risk factors for sleep apnea were identified: Additionally, the following were reviewed: Past medical records, past medical and surgical history, family and social background, as well as relevant laboratory results, imaging findings, and medical notes, where applicable.  This note was generated by myself in part by using dictation software, and as a result, it may contain unintentional typos and errors.  Nevertheless, effort was made to accurately convey the pertinent aspects of the patient's visit.   Dedra Gores, MD  [facility] and [employer] certified in Sleep Medicine by The [employer] of Sleep Medicine and Diplomate of the [employer] of [employer] (AASM) . Board certified In Neurology, Diplomat of the ABPN,   Fellow of the [employer] of Neu[employer].

## 2023-10-30 NOTE — Patient Instructions (Addendum)
 Parkinson's Disease Parkinson's disease causes problems with movement. It makes it harder for you to walk or control your body. Each person with the disease is affected differently. Treatments can help you manage your symptoms. Parkinson's disease can range from mild to very bad, but it gets worse over time. This often happens slowly over many years. What are the causes? Parkinson's disease is caused by a loss of brain cells called neurons. These brain cells make a substance called dopamine, which is needed to control body movement. It's not known what causes the brain cells to die. What increases the risk? Being male. Being 78 years of age or older. Having a family history of Parkinson's disease. Having an injury to the brain in the past. Being around things that are harmful or poisonous, such as pesticides. Having depression. This is when you feel sad or hopeless. What are the signs or symptoms? Symptoms can vary and get worse over time. The main symptoms can be seen in your movement. These include: Shaking or tremors that you can't control. This happens while you're resting. Stiffness in your arms and legs. Losing facial expressions. Walking in a way that isn't normal. You may walk with short, shuffling steps. Loss of balance when standing. You may sway, fall backward, or have trouble making turns. Other symptoms include: Being very sad, worried, or nervous. Having delusions. These are strong beliefs that aren't true. Having hallucinations. This is when you see, hear, taste, smell, or feel things that aren't real. Trouble speaking or swallowing. Trouble pooping (constipation). Needing to pee (urinate) right away, peeing often, or losing control of when you pee or poop. Sleep problems. How is this diagnosed? A diagnosis may be made based on symptoms, your medical history, and a physical exam. You may also have imaging tests that make pictures of your brain. How is this treated? There  is no cure for Parkinson's disease. The goal of treatment is to manage your symptoms. It may include: Medicines. Therapy to help with talking or movement. Surgery to reduce shaking and other movements that you can't control. Follow these instructions at home: Medicines Take your medicines only as told by your health care provider. Avoid taking pain or sleeping medicines. These can affect your thinking. Activity Ask your provider if it's safe for you to drive. Exercise as told by your provider or physical therapist. Lifestyle  Put grab bars and railings in your home, especially near the toilet and in the shower. These help prevent falls. Do not smoke, vape, or use products with nicotine or tobacco in them. If you need help quitting, talk with your provider. Do not drink alcohol . General instructions Talk with your provider to find out what kind of help you need at home. Ask about ways to stay safe. Join a support group for people with Parkinson's disease. Where to find more information [employer]: [employer] Parkinson's Foundation: parkinson.org Contact a health care provider if: Medicines don't help your symptoms. You have a lot of side effects from your medicines. You keep losing your balance or you fall. You need more help at home. You have trouble swallowing. You have a very hard time pooping. You feel very sad, worried, or confused. You see, hear, taste, smell, or feel things that aren't real. Get help right away if: You were hurt in a fall. You can't swallow without choking. You have chest pain or trouble breathing. You don't feel safe at home. These symptoms may be an emergency.  Call 911 right away. Do not wait to see if the symptoms will go away. Do not drive yourself to the hospital. Also, get help right away if: You feel like you may hurt yourself or others. You have thoughts about taking your own life. Take one of  these steps: Go to your nearest emergency room. Call 911. Call the National Suicide Prevention Lifeline at 479-106-0422 or 988. Text the Crisis Text Line at 6135281387. This information is not intended to replace advice given to you by your health care provider. Make sure you discuss any questions you have with your health care provider. Document Revised: 10/10/2022 Document Reviewed: 03/25/2022 [employer] Patient Education  2024 [employer]   In short, Anthony [name]  is presenting with a new CPAP setting but was not switched to BiPAP. His residual AHI is < 2 h and he sleeps with the head of bed elevated. Still has 1-2 times nocturia , also related to Jardiance . I gave him today a FFM N 20 standart in large  to see of the high air leaks can be reduced.  No changes in settings . No EPR is set.      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 He has not fallen sicne  he received  PT / OT have helped . Dysphonia with low volume voice.  4) falls are like also related to low BP( he was dizzy and his HYTN medication had to be adjusted, he is feeling better.  5) diabetic neuropathy also contributes to fall risk, he walks best on even ground.  PD / Gait disorder : important also because his agent orange exposure can be related to this, neuropathy in diabetes, and likely worsening as is his Parkinson -Disorder        My Plan is to proceed with:.  I will continue current medication, 25/ 100 TID before meals.   Yearly RV 12 months. If he needs more PT I am happy to order it.  Supplies and settings for CPAP remain as is. [facility].

## 2023-11-14 ENCOUNTER — Encounter: Payer: Self-pay | Admitting: Neurology

## 2023-11-16 DIAGNOSIS — E1165 Type 2 diabetes mellitus with hyperglycemia: Secondary | ICD-10-CM | POA: Diagnosis not present

## 2023-11-25 DIAGNOSIS — Z794 Long term (current) use of insulin: Secondary | ICD-10-CM | POA: Diagnosis not present

## 2023-11-25 DIAGNOSIS — I2510 Atherosclerotic heart disease of native coronary artery without angina pectoris: Secondary | ICD-10-CM | POA: Diagnosis not present

## 2023-11-25 DIAGNOSIS — E1140 Type 2 diabetes mellitus with diabetic neuropathy, unspecified: Secondary | ICD-10-CM | POA: Diagnosis not present

## 2023-11-25 DIAGNOSIS — I110 Hypertensive heart disease with heart failure: Secondary | ICD-10-CM | POA: Diagnosis not present

## 2023-12-01 ENCOUNTER — Ambulatory Visit: Admitting: Neurology

## 2023-12-03 DIAGNOSIS — H903 Sensorineural hearing loss, bilateral: Secondary | ICD-10-CM | POA: Diagnosis not present

## 2023-12-17 DIAGNOSIS — E1165 Type 2 diabetes mellitus with hyperglycemia: Secondary | ICD-10-CM | POA: Diagnosis not present

## 2024-04-01 ENCOUNTER — Ambulatory Visit: Attending: Adult Health | Admitting: Physical Therapy

## 2024-11-02 ENCOUNTER — Ambulatory Visit: Admitting: Adult Health
